# Patient Record
Sex: Female | Born: 1950
Health system: Southern US, Community
[De-identification: ages and names within clinical notes are randomized; demographics above are authoritative.]

## PROBLEM LIST (undated history)

## (undated) DIAGNOSIS — M810 Age-related osteoporosis without current pathological fracture: Secondary | ICD-10-CM

## (undated) DIAGNOSIS — E119 Type 2 diabetes mellitus without complications: Secondary | ICD-10-CM

## (undated) DIAGNOSIS — T7840XA Allergy, unspecified, initial encounter: Secondary | ICD-10-CM

## (undated) DIAGNOSIS — E785 Hyperlipidemia, unspecified: Secondary | ICD-10-CM

## (undated) DIAGNOSIS — C50911 Malignant neoplasm of unspecified site of right female breast: Secondary | ICD-10-CM

## (undated) DIAGNOSIS — F329 Major depressive disorder, single episode, unspecified: Secondary | ICD-10-CM

## (undated) DIAGNOSIS — F32A Depression, unspecified: Secondary | ICD-10-CM

## (undated) DIAGNOSIS — I10 Essential (primary) hypertension: Secondary | ICD-10-CM

## (undated) DIAGNOSIS — R0600 Dyspnea, unspecified: Secondary | ICD-10-CM

## (undated) DIAGNOSIS — G500 Trigeminal neuralgia: Secondary | ICD-10-CM

## (undated) HISTORY — DX: Hyperlipidemia, unspecified: E78.5

## (undated) HISTORY — DX: Type 2 diabetes mellitus without complications: E11.9

## (undated) HISTORY — DX: Allergy, unspecified, initial encounter: T78.40XA

## (undated) HISTORY — PX: SPINE SURGERY: SHX786

## (undated) HISTORY — DX: Trigeminal neuralgia: G50.0

## (undated) HISTORY — PX: BREAST EXCISIONAL BIOPSY: SUR124

## (undated) HISTORY — DX: Essential (primary) hypertension: I10

## (undated) HISTORY — DX: Age-related osteoporosis without current pathological fracture: M81.0

---
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
## 1976-09-16 HISTORY — PX: ABDOMINAL HYSTERECTOMY: SHX81

## 1984-09-16 HISTORY — PX: BREAST CYST EXCISION: SHX579

## 1993-09-16 HISTORY — PX: LAMINECTOMY: SHX219

## 2003-01-28 DIAGNOSIS — E1159 Type 2 diabetes mellitus with other circulatory complications: Secondary | ICD-10-CM | POA: Insufficient documentation

## 2003-03-09 DIAGNOSIS — K219 Gastro-esophageal reflux disease without esophagitis: Secondary | ICD-10-CM | POA: Insufficient documentation

## 2007-01-16 DIAGNOSIS — G500 Trigeminal neuralgia: Secondary | ICD-10-CM | POA: Insufficient documentation

## 2008-02-19 DIAGNOSIS — F4320 Adjustment disorder, unspecified: Secondary | ICD-10-CM | POA: Insufficient documentation

## 2009-02-24 DIAGNOSIS — E669 Obesity, unspecified: Secondary | ICD-10-CM | POA: Insufficient documentation

## 2010-09-16 DIAGNOSIS — E119 Type 2 diabetes mellitus without complications: Secondary | ICD-10-CM

## 2010-09-16 HISTORY — DX: Type 2 diabetes mellitus without complications: E11.9

## 2013-08-30 ENCOUNTER — Other Ambulatory Visit (HOSPITAL_COMMUNITY): Payer: Self-pay

## 2013-09-06 ENCOUNTER — Inpatient Hospital Stay (HOSPITAL_COMMUNITY): Admission: RE | Admit: 2013-09-06 | Payer: Self-pay | Source: Ambulatory Visit | Admitting: Orthopedic Surgery

## 2013-09-06 ENCOUNTER — Encounter (HOSPITAL_COMMUNITY): Admission: RE | Payer: Self-pay | Source: Ambulatory Visit

## 2013-09-06 SURGERY — ARTHROPLASTY, KNEE, TOTAL
Anesthesia: General | Laterality: Right

## 2014-04-27 LAB — CBC AND DIFFERENTIAL
HCT: 45 % (ref 36–46)
Hemoglobin: 14.7 g/dL (ref 12.0–16.0)
Platelets: 277 10*3/uL (ref 150–399)
WBC: 7.9 10^3/mL

## 2014-04-27 LAB — LIPID PANEL
Cholesterol: 208 mg/dL — AB (ref 0–200)
HDL: 60 mg/dL (ref 35–70)
LDL Cholesterol: 112 mg/dL
TRIGLYCERIDES: 181 mg/dL — AB (ref 40–160)

## 2014-04-27 LAB — BASIC METABOLIC PANEL
BUN: 26 mg/dL — AB (ref 4–21)
Creatinine: 0.9 mg/dL (ref <=1.1)
Glucose: 111 mg/dL
POTASSIUM: 4.4 mmol/L (ref 3.4–5.3)
Sodium: 142 mmol/L (ref 137–147)

## 2014-04-27 LAB — HEPATIC FUNCTION PANEL
ALT: 18 U/L (ref 7–35)
AST: 16 U/L (ref 13–35)

## 2014-04-27 LAB — TSH: TSH: 0.6 u[IU]/mL (ref <=5.90)

## 2014-12-06 LAB — HEMOGLOBIN A1C: Hgb A1c MFr Bld: 6 % (ref 4.0–6.0)

## 2015-01-31 DIAGNOSIS — M179 Osteoarthritis of knee, unspecified: Secondary | ICD-10-CM | POA: Insufficient documentation

## 2015-01-31 DIAGNOSIS — J309 Allergic rhinitis, unspecified: Secondary | ICD-10-CM | POA: Insufficient documentation

## 2015-01-31 DIAGNOSIS — E559 Vitamin D deficiency, unspecified: Secondary | ICD-10-CM | POA: Insufficient documentation

## 2015-01-31 DIAGNOSIS — M1710 Unilateral primary osteoarthritis, unspecified knee: Secondary | ICD-10-CM | POA: Insufficient documentation

## 2015-01-31 DIAGNOSIS — G4700 Insomnia, unspecified: Secondary | ICD-10-CM | POA: Insufficient documentation

## 2015-02-16 ENCOUNTER — Other Ambulatory Visit: Payer: Self-pay | Admitting: Family Medicine

## 2015-04-05 ENCOUNTER — Encounter: Payer: Self-pay | Admitting: Family Medicine

## 2015-04-05 ENCOUNTER — Ambulatory Visit (INDEPENDENT_AMBULATORY_CARE_PROVIDER_SITE_OTHER): Payer: BC Managed Care – PPO | Admitting: Family Medicine

## 2015-04-05 VITALS — BP 124/82 | HR 64 | Temp 98.2°F | Resp 16 | Ht 64.0 in | Wt 244.0 lb

## 2015-04-05 DIAGNOSIS — E119 Type 2 diabetes mellitus without complications: Secondary | ICD-10-CM | POA: Diagnosis not present

## 2015-04-05 DIAGNOSIS — E1169 Type 2 diabetes mellitus with other specified complication: Secondary | ICD-10-CM | POA: Insufficient documentation

## 2015-04-05 DIAGNOSIS — E785 Hyperlipidemia, unspecified: Secondary | ICD-10-CM | POA: Insufficient documentation

## 2015-04-05 DIAGNOSIS — E559 Vitamin D deficiency, unspecified: Secondary | ICD-10-CM | POA: Diagnosis not present

## 2015-04-05 DIAGNOSIS — G500 Trigeminal neuralgia: Secondary | ICD-10-CM

## 2015-04-05 DIAGNOSIS — Z0000 Encounter for general adult medical examination without abnormal findings: Secondary | ICD-10-CM

## 2015-04-05 DIAGNOSIS — F4322 Adjustment disorder with anxiety: Secondary | ICD-10-CM

## 2015-04-05 DIAGNOSIS — J309 Allergic rhinitis, unspecified: Secondary | ICD-10-CM

## 2015-04-05 DIAGNOSIS — I10 Essential (primary) hypertension: Secondary | ICD-10-CM | POA: Diagnosis not present

## 2015-04-05 DIAGNOSIS — M179 Osteoarthritis of knee, unspecified: Secondary | ICD-10-CM | POA: Diagnosis not present

## 2015-04-05 DIAGNOSIS — M1710 Unilateral primary osteoarthritis, unspecified knee: Secondary | ICD-10-CM

## 2015-04-05 MED ORDER — BUPROPION HCL ER (SR) 150 MG PO TB12: 150.0000 mg | ORAL_TABLET | Freq: Two times a day (BID) | ORAL | Status: DC

## 2015-04-05 MED ORDER — MELOXICAM 15 MG PO TABS: 15.0000 mg | ORAL_TABLET | Freq: Every day | ORAL | Status: DC

## 2015-04-05 MED ORDER — LIDOCAINE 5 % EX OINT: TOPICAL_OINTMENT | Freq: Every day | CUTANEOUS | Status: DC

## 2015-04-05 MED ORDER — METFORMIN HCL 1000 MG PO TABS: 1000.0000 mg | ORAL_TABLET | Freq: Two times a day (BID) | ORAL | Status: DC

## 2015-04-05 MED ORDER — PREDNISONE 10 MG PO TABS: ORAL_TABLET | ORAL | Status: DC

## 2015-04-05 MED ORDER — ALPRAZOLAM 0.5 MG PO TABS: 0.5000 mg | ORAL_TABLET | Freq: Every evening | ORAL | Status: DC | PRN

## 2015-04-05 MED ORDER — AMLODIPINE BESYLATE 5 MG PO TABS: 5.0000 mg | ORAL_TABLET | Freq: Every day | ORAL | Status: DC

## 2015-04-05 MED ORDER — LISINOPRIL-HYDROCHLOROTHIAZIDE 10-12.5 MG PO TABS: 1.0000 | ORAL_TABLET | Freq: Every day | ORAL | Status: DC

## 2015-04-05 MED ORDER — SIMVASTATIN 10 MG PO TABS: 10.0000 mg | ORAL_TABLET | Freq: Every day | ORAL | Status: DC

## 2015-04-05 MED ORDER — BISOPROLOL-HYDROCHLOROTHIAZIDE 10-6.25 MG PO TABS: 1.0000 | ORAL_TABLET | Freq: Two times a day (BID) | ORAL | Status: DC

## 2015-04-05 MED ORDER — SITAGLIPTIN PHOSPHATE 100 MG PO TABS: 100.0000 mg | ORAL_TABLET | Freq: Every day | ORAL | Status: DC

## 2015-04-05 MED ORDER — BACLOFEN 10 MG PO TABS: 10.0000 mg | ORAL_TABLET | Freq: Two times a day (BID) | ORAL | Status: DC

## 2015-04-05 NOTE — Progress Notes (Signed)
Patient ID: Nichole Martinez, female   DOB: [birth year], 64 y.o.   MRN: [id number]        Patient: Nichole Martinez, Female    DOB: Sep 29, 1950, 64 y.o.   MRN: [id number] Visit Date: 04/05/2015  Today's Provider: Margarita Rana, MD   Chief Complaint  Patient presents with  . Annual Exam   Subjective:    Annual physical exam Nichole Martinez is a 64 y.o. female who presents today for health maintenance and complete physical. She feels well. 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 She reports she is sleeping well.  -----------------------------------------------------------------   Review of Systems  Social History She  reports that she quit smoking about 15 years ago. She has never used smokeless tobacco. She reports that she does not drink alcohol or use illicit drugs.  Patient Active Problem List   Diagnosis Date Noted  . Allergic rhinitis 01/31/2015  . Arthritis of knee, degenerative 01/31/2015  . Cannot sleep 01/31/2015  . Avitaminosis D 01/31/2015  . Adiposity 02/24/2009  . Diabetes mellitus, type 2 03/07/2008  . Adaptation reaction 02/19/2008  . Fothergill's neuralgia 01/16/2007  . Acid reflux 03/09/2003  . Hypercholesteremia 02/15/2003  . BP (high blood pressure) 01/28/2003    Past Surgical History  Procedure Laterality Date  . Laminectomy  1995  . Abdominal hysterectomy  1978    Family History Her She was adopted. Family history is unknown by patient.    Allergies  Allergen Reactions  . Morphine     Anaphallaxis.    Previous Medications   ALPRAZOLAM (XANAX) 0.5 MG TABLET    Take by mouth.   AMLODIPINE (NORVASC) 5 MG TABLET    Take by mouth.   BACLOFEN (LIORESAL) 10 MG TABLET    Take by mouth.   BISOPROLOL-HYDROCHLOROTHIAZIDE (ZIAC) 10-6.25 MG PER TABLET    Take by mouth.   BUPROPION (WELLBUTRIN SR) 150 MG 12 HR TABLET    Take by mouth.   CYANOCOBALAMIN 1000 MCG TABLET    Take by mouth.   LIDOCAINE (XYLOCAINE) 5 %  OINTMENT    APPLY 1/2 INCH TOPICALLY TO KNEE AS NEEDED   LISINOPRIL-HYDROCHLOROTHIAZIDE (PRINZIDE,ZESTORETIC) 10-12.5 MG PER TABLET    Take by mouth.   LORATADINE (CLARITIN) 10 MG TABLET    Take by mouth.   MELOXICAM (MOBIC) 15 MG TABLET    Take by mouth.   METFORMIN (GLUCOPHAGE) 1000 MG TABLET    Take by mouth.   MULTIPLE VITAMINS PO    MULTIVITAMINS (Oral Tablet)  1 Every Day for 0 days  Quantity: 0.00;  Refills: 0   Ordered :17-Apr-2011  [name] ;  Started 24-February-2009 Active   OMEGA 3-6-9 FATTY ACIDS PO    OMEGA-3, 1000MG  (Oral Capsule)  1 Three Times Daily for 0 days  Quantity: 0.00;  Refills: 0   Ordered :17-Apr-2011  [name] ;  Started 24-February-2009 Active   PREDNISONE (DELTASONE) 10 MG TABLET    Take by mouth.   SIMVASTATIN (ZOCOR) 10 MG TABLET    Take by mouth.   SITAGLIPTIN (JANUVIA) 100 MG TABLET    Take by mouth.    Patient Care Team: Margarita Rana, MD as PCP - General (Family Medicine)     Objective:   Vitals: BP 124/82 mmHg  Pulse 64  Temp(Src) 98.2 F (36.8 C) (Oral)  Resp 16  Ht 5\' 4"  (1.626 m)  Wt 244 lb (110.678 kg)  BMI 41.86 kg/m2   Physical Exam  Constitutional: She appears well-developed and well-nourished.  HENT:  Head: Normocephalic and atraumatic.  Right Ear: Hearing, tympanic membrane, external ear and ear canal normal.  Left Ear: Hearing, tympanic membrane, external ear and ear canal normal.  Nose: Nose normal.  Mouth/Throat: Uvula is midline, oropharynx is clear and moist and mucous membranes are normal.  Eyes: Conjunctivae, EOM and lids are normal. Lids are everted and swept, no foreign bodies found.  Neck: Trachea normal and normal range of motion. Carotid bruit is not present.  Cardiovascular: Normal rate, regular rhythm, normal heart sounds and normal pulses.   Pulmonary/Chest: Effort normal and breath sounds normal.  Abdominal: Normal appearance.  Neurological: She is alert.     Depression Screen No flowsheet data  found.    Assessment & Plan:     Routine Health Maintenance and Physical Exam  Exercise Activities and Dietary recommendations Goals    None       There is no immunization history on file for this patient.  Health Maintenance  Topic Date Due  . PNEUMOCOCCAL POLYSACCHARIDE VACCINE (1) 09/04/1955  . FOOT EXAM  09/04/1963  . URINE MICROALBUMIN  09/04/1963  . HIV Screening  09/03/1968  . PAP SMEAR  09/04/1971  . TETANUS/TDAP  09/03/1972  . ZOSTAVAX  09/03/2013  . MAMMOGRAM  04/15/2016 (Originally 09/04/2003)  . COLONOSCOPY  04/15/2016 (Originally 09/04/2003)  . INFLUENZA VACCINE  04/17/2015  . HEMOGLOBIN A1C  06/08/2015  . OPHTHALMOLOGY EXAM  12/02/2015      Discussed health benefits of physical activity, and encouraged her to engage in regular exercise appropriate for her age and condition.   Patient was seen and examined by Jerrell Belfast, MD, and note scribed by Ashley Royalty, CMA.  I have reviewed the document for accuracy and completeness and I agree with above. Jerrell Belfast, MD   Margarita Rana, MD    --------------------------------------------------------------------

## 2015-04-06 LAB — CBC WITH DIFFERENTIAL/PLATELET
Basophils Absolute: 0 10*3/uL (ref 0.0–0.2)
Basos: 0 %
EOS (ABSOLUTE): 1.8 10*3/uL — ABNORMAL HIGH (ref 0.0–0.4)
Eos: 15 %
HEMOGLOBIN: 14.6 g/dL (ref 11.1–15.9)
Hematocrit: 45.4 % (ref 34.0–46.6)
IMMATURE GRANULOCYTES: 0 %
Immature Grans (Abs): 0 10*3/uL (ref 0.0–0.1)
LYMPHS: 24 %
Lymphocytes Absolute: 2.9 10*3/uL (ref 0.7–3.1)
MCH: 25.9 pg — AB (ref 26.6–33.0)
MCHC: 32.2 g/dL (ref 31.5–35.7)
MCV: 81 fL (ref 79–97)
Monocytes Absolute: 0.6 10*3/uL (ref 0.1–0.9)
Monocytes: 5 %
Neutrophils Absolute: 6.5 10*3/uL (ref 1.4–7.0)
Neutrophils: 56 %
Platelets: 308 10*3/uL (ref 150–379)
RBC: 5.64 x10E6/uL — ABNORMAL HIGH (ref 3.77–5.28)
RDW: 14.9 % (ref 12.3–15.4)
WBC: 11.8 10*3/uL — AB (ref 3.4–10.8)

## 2015-04-06 LAB — LIPID PANEL
CHOL/HDL RATIO: 3.9 ratio (ref 0.0–4.4)
Cholesterol, Total: 233 mg/dL — ABNORMAL HIGH (ref 100–199)
HDL: 59 mg/dL (ref >39)
LDL CALC: 116 mg/dL — AB (ref 0–99)
Triglycerides: 292 mg/dL — ABNORMAL HIGH (ref 0–149)
VLDL Cholesterol Cal: 58 mg/dL — ABNORMAL HIGH (ref 5–40)

## 2015-04-06 LAB — COMPREHENSIVE METABOLIC PANEL
ALT: 21 IU/L (ref 0–32)
AST: 20 IU/L (ref 0–40)
Albumin/Globulin Ratio: 1.4 (ref 1.1–2.5)
Albumin: 4.6 g/dL (ref 3.6–4.8)
Alkaline Phosphatase: 78 IU/L (ref 39–117)
BUN/Creatinine Ratio: 22 (ref 11–26)
BUN: 19 mg/dL (ref 8–27)
Bilirubin Total: 0.5 mg/dL (ref 0.0–1.2)
CO2: 22 mmol/L (ref 18–29)
Calcium: 10.3 mg/dL (ref 8.7–10.3)
Chloride: 98 mmol/L (ref 97–108)
Creatinine, Ser: 0.88 mg/dL (ref 0.57–1.00)
GFR calc Af Amer: 82 mL/min/{1.73_m2} (ref >59)
GFR calc non Af Amer: 71 mL/min/{1.73_m2} (ref >59)
Globulin, Total: 3.2 g/dL (ref 1.5–4.5)
Glucose: 144 mg/dL — ABNORMAL HIGH (ref 65–99)
POTASSIUM: 4.9 mmol/L (ref 3.5–5.2)
SODIUM: 142 mmol/L (ref 134–144)
TOTAL PROTEIN: 7.8 g/dL (ref 6.0–8.5)

## 2015-04-06 LAB — HEMOGLOBIN A1C
ESTIMATED AVERAGE GLUCOSE: 134 mg/dL
Hgb A1c MFr Bld: 6.3 % — ABNORMAL HIGH (ref 4.8–5.6)

## 2015-04-06 LAB — TSH: TSH: 1.59 u[IU]/mL (ref 0.450–4.500)

## 2015-04-06 LAB — VITAMIN D 25 HYDROXY (VIT D DEFICIENCY, FRACTURES): Vit D, 25-Hydroxy: 25.2 ng/mL — ABNORMAL LOW (ref 30.0–100.0)

## 2015-04-11 ENCOUNTER — Telehealth: Payer: Self-pay

## 2015-04-11 DIAGNOSIS — E785 Hyperlipidemia, unspecified: Secondary | ICD-10-CM

## 2015-04-11 MED ORDER — SIMVASTATIN 20 MG PO TABS: 20.0000 mg | ORAL_TABLET | Freq: Every day | ORAL | Status: DC

## 2015-04-11 NOTE — Telephone Encounter (Signed)
-----   Message from Margarita Rana, MD sent at 04/07/2015  5:39 PM EDT ----- Increase Simvastatin to 20 mg and recheck labs in about 2 months. Also, can increase Vit D to about 2000 iu daily. Thanks.

## 2015-04-11 NOTE — Telephone Encounter (Signed)
Pt advised as directed below.  Sent Simvastatin 20mg  to [facility] [address]   Thanks,   -Mickel Baas

## 2015-05-11 ENCOUNTER — Other Ambulatory Visit: Payer: Self-pay

## 2015-05-11 DIAGNOSIS — J309 Allergic rhinitis, unspecified: Secondary | ICD-10-CM

## 2015-05-11 MED ORDER — LORATADINE 10 MG PO TABS: 10.0000 mg | ORAL_TABLET | Freq: Every day | ORAL | Status: DC

## 2015-08-31 ENCOUNTER — Encounter: Payer: Self-pay | Admitting: Physician Assistant

## 2015-08-31 ENCOUNTER — Ambulatory Visit (INDEPENDENT_AMBULATORY_CARE_PROVIDER_SITE_OTHER): Payer: BC Managed Care – PPO | Admitting: Physician Assistant

## 2015-08-31 VITALS — BP 138/80 | HR 83 | Temp 98.6°F | Resp 14 | Wt 245.9 lb

## 2015-08-31 DIAGNOSIS — J0140 Acute pansinusitis, unspecified: Secondary | ICD-10-CM | POA: Diagnosis not present

## 2015-08-31 DIAGNOSIS — H66002 Acute suppurative otitis media without spontaneous rupture of ear drum, left ear: Secondary | ICD-10-CM | POA: Diagnosis not present

## 2015-08-31 MED ORDER — AZITHROMYCIN 250 MG PO TABS: ORAL_TABLET | ORAL | Status: DC

## 2015-08-31 NOTE — Patient Instructions (Signed)
Otitis Media With Effusion Otitis media with effusion is the presence of fluid in the middle ear. This is a common problem in children, which often follows ear infections. It may be present for weeks or longer after the infection. Unlike an acute ear infection, otitis media with effusion refers only to fluid behind the ear drum and not infection. Children with repeated ear and sinus infections and allergy problems are the most likely to get otitis media with effusion. CAUSES  The most frequent cause of the fluid buildup is dysfunction of the eustachian tubes. These are the tubes that drain fluid in the ears to the back of the nose (nasopharynx). SYMPTOMS   The main symptom of this condition is hearing loss. As a result, you or your child may:  Listen to the TV at a loud volume.  Not respond to questions.  Ask "what" often when spoken to.  Mistake or confuse one sound or word for another.  There may be a sensation of fullness or pressure but usually not pain. DIAGNOSIS   Your health care provider will diagnose this condition by examining you or your child's ears.  Your health care provider may test the pressure in you or your child's ear with a tympanometer.  A hearing test may be conducted if the problem persists. TREATMENT   Treatment depends on the duration and the effects of the effusion.  Antibiotics, decongestants, nose drops, and cortisone-type drugs (tablets or nasal spray) may not be helpful.  Children with persistent ear effusions may have delayed language or behavioral problems. Children at risk for developmental delays in hearing, learning, and speech may require referral to a specialist earlier than children not at risk.  You or your child's health care provider may suggest a referral to an ear, nose, and throat surgeon for treatment. The following may help restore normal hearing:  Drainage of fluid.  Placement of ear tubes (tympanostomy tubes).  Removal of adenoids  (adenoidectomy). HOME CARE INSTRUCTIONS   Avoid secondhand smoke.  Infants who are breastfed are less likely to have this condition.  Avoid feeding infants while they are lying flat.  Avoid known environmental allergens.  Avoid people who are sick. SEEK MEDICAL CARE IF:   Hearing is not better in 3 months.  Hearing is worse.  Ear pain.  Drainage from the ear.  Dizziness. MAKE SURE YOU:   Understand these instructions.  Will watch your condition.  Will get help right away if you are not doing well or get worse.   This information is not intended to replace advice given to you by your health care provider. Make sure you discuss any questions you have with your health care provider.   Document Released: 10/10/2004 Document Revised: 09/23/2014 Document Reviewed: 03/30/2013 Elsevier Interactive Patient Education 2016 Elsevier Inc[employer] Sinusitis, Adult Sinusitis is redness, soreness, and inflammation of the paranasal sinuses. Paranasal sinuses are air pockets within the bones of your face. They are located beneath your eyes, in the middle of your forehead, and above your eyes. In healthy paranasal sinuses, mucus is able to drain out, and air is able to circulate through them by way of your nose. However, when your paranasal sinuses are inflamed, mucus and air can become trapped. This can allow bacteria and other germs to grow and cause infection. Sinusitis can develop quickly and last only a short time (acute) or continue over a long period (chronic). Sinusitis that lasts for more than 12 weeks is considered chronic. CAUSES Causes of sinusitis   include:  Allergies.  Structural abnormalities, such as displacement of the cartilage that separates your nostrils (deviated septum), which can decrease the air flow through your nose and sinuses and affect sinus drainage.  Functional abnormalities, such as when the small hairs (cilia) that line your sinuses and help remove mucus do not work  properly or are not present. SIGNS AND SYMPTOMS Symptoms of acute and chronic sinusitis are the same. The primary symptoms are pain and pressure around the affected sinuses. Other symptoms include:  Upper toothache.  Earache.  Headache.  Bad breath.  Decreased sense of smell and taste.  A cough, which worsens when you are lying flat.  Fatigue.  Fever.  Thick drainage from your nose, which often is green and may contain pus (purulent).  Swelling and warmth over the affected sinuses. DIAGNOSIS Your health care provider will perform a physical exam. During your exam, your health care provider may perform any of the following to help determine if you have acute sinusitis or chronic sinusitis:  Look in your nose for signs of abnormal growths in your nostrils (nasal polyps).  Tap over the affected sinus to check for signs of infection.  View the inside of your sinuses using an imaging device that has a light attached (endoscope). If your health care provider suspects that you have chronic sinusitis, one or more of the following tests may be recommended:  Allergy tests.  Nasal culture. A sample of mucus is taken from your nose, sent to a lab, and screened for bacteria.  Nasal cytology. A sample of mucus is taken from your nose and examined by your health care provider to determine if your sinusitis is related to an allergy. TREATMENT Most cases of acute sinusitis are related to a viral infection and will resolve on their own within 10 days. Sometimes, medicines are prescribed to help relieve symptoms of both acute and chronic sinusitis. These may include pain medicines, decongestants, nasal steroid sprays, or saline sprays. However, for sinusitis related to a bacterial infection, your health care provider will prescribe antibiotic medicines. These are medicines that will help kill the bacteria causing the infection. Rarely, sinusitis is caused by a fungal infection. In these cases,  your health care provider will prescribe antifungal medicine. For some cases of chronic sinusitis, surgery is needed. Generally, these are cases in which sinusitis recurs more than 3 times per year, despite other treatments. HOME CARE INSTRUCTIONS  Drink plenty of water. Water helps thin the mucus so your sinuses can drain more easily.  Use a humidifier.  Inhale steam 3-4 times a day (for example, sit in the bathroom with the shower running).  Apply a warm, moist washcloth to your face 3-4 times a day, or as directed by your health care provider.  Use saline nasal sprays to help moisten and clean your sinuses.  Take medicines only as directed by your health care provider.  If you were prescribed either an antibiotic or antifungal medicine, finish it all even if you start to feel better. SEEK IMMEDIATE MEDICAL CARE IF:  You have increasing pain or severe headaches.  You have nausea, vomiting, or drowsiness.  You have swelling around your face.  You have vision problems.  You have a stiff neck.  You have difficulty breathing.   This information is not intended to replace advice given to you by your health care provider. Make sure you discuss any questions you have with your health care provider.   Document Released: 09/02/2005 Document Revised: 09/23/2014   Document Reviewed: 09/17/2011 Elsevier Interactive Patient Education 2016 Elsevier Inc.  

## 2015-08-31 NOTE — Progress Notes (Signed)
Patient: Nichole Martinez Female    DOB: Mar 02, 1951   64 y.o.   MRN: [id number] Visit Date: 08/31/2015  Today's Provider: [name], PA-C   Chief Complaint  Patient presents with  . URI   Subjective:    URI  This is a new problem. The current episode started 1 to 4 weeks ago. The problem has been gradually worsening. There has been no fever. Associated symptoms include congestion, coughing, rhinorrhea, a sore throat and wheezing. Pertinent negatives include no ear pain, headaches, nausea, sinus pain, sneezing or vomiting. She has tried decongestant (Delsym, Vicks severe cold and Flu and Mucinex) for the symptoms.  Patient is also requesting a note to excuse her from work. 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 She is unable to work when she has any viral symptoms so as not to spread and cause illness with patient.     Allergies  Allergen Reactions  . Morphine     Anaphallaxis.   Previous Medications   ALPRAZOLAM (XANAX) 0.5 MG TABLET    Take 1 tablet (0.5 mg total) by mouth at bedtime as needed for anxiety.   AMLODIPINE (NORVASC) 5 MG TABLET    Take 1 tablet (5 mg total) by mouth daily.   BACLOFEN (LIORESAL) 10 MG TABLET    Take 1 tablet (10 mg total) by mouth 2 (two) times daily.   BISOPROLOL-HYDROCHLOROTHIAZIDE (ZIAC) 10-6.25 MG PER TABLET    Take 1 tablet by mouth 2 (two) times daily.   BUPROPION (WELLBUTRIN SR) 150 MG 12 HR TABLET    Take 1 tablet (150 mg total) by mouth 2 (two) times daily.   CYANOCOBALAMIN 1000 MCG TABLET    Take by mouth.   LIDOCAINE (XYLOCAINE) 5 % OINTMENT    Apply topically daily.   LISINOPRIL-HYDROCHLOROTHIAZIDE (PRINZIDE,ZESTORETIC) 10-12.5 MG PER TABLET    Take 1 tablet by mouth daily.   LORATADINE (CLARITIN) 10 MG TABLET    Take 1 tablet (10 mg total) by mouth daily.   MELOXICAM (MOBIC) 15 MG TABLET    Take 1 tablet (15 mg total) by mouth daily.   METFORMIN (GLUCOPHAGE) 1000 MG TABLET     Take 1 tablet (1,000 mg total) by mouth 2 (two) times daily with a meal.   MULTIPLE VITAMINS PO    MULTIVITAMINS (Oral Tablet)  1 Every Day for 0 days  Quantity: 0.00;  Refills: 0   Ordered :17-Apr-2011  [name] ;  Started 24-February-2009 Active   OMEGA 3-6-9 FATTY ACIDS PO    OMEGA-3, 1000MG  (Oral Capsule)  1 Three Times Daily for 0 days  Quantity: 0.00;  Refills: 0   Ordered :17-Apr-2011  [name] ;  Started 24-February-2009 Active   PREDNISONE (DELTASONE) 10 MG TABLET    5 Tablets for one day; 4 tablets for one day; 3 tablets for one day; 2 tablets for one day; 1 tablet for one day.   SIMVASTATIN (ZOCOR) 20 MG TABLET    Take 1 tablet (20 mg total) by mouth at bedtime.   SITAGLIPTIN (JANUVIA) 100 MG TABLET    Take 1 tablet (100 mg total) by mouth daily.    Review of Systems  Constitutional: Positive for fatigue. Negative for fever.  HENT: Positive for congestion, rhinorrhea, sinus pressure and sore throat. Negative for ear pain, postnasal drip, sneezing, tinnitus, trouble swallowing and voice change.   Eyes: Negative.   Respiratory: Positive for cough, chest tightness and  wheezing. Negative for shortness of breath.   Cardiovascular: Negative.   Gastrointestinal: Negative for nausea and vomiting.  Neurological: Negative for dizziness and headaches.    Social History  Substance Use Topics  . Smoking status: Former Smoker    Quit date: 03/16/2000  . Smokeless tobacco: Never Used  . Alcohol Use: No   Objective:   BP 138/80 mmHg  Pulse 83  Temp(Src) 98.6 F (37 C) (Oral)  Resp 14  Wt 245 lb 14.4 oz (111.54 kg)  SpO2 95%  Physical Exam  Constitutional: She appears well-developed and well-nourished. No distress.  HENT:  Head: Normocephalic and atraumatic.  Right Ear: Hearing, external ear and ear canal normal. Tympanic membrane is not erythematous and not bulging. A middle ear effusion is present.  Left Ear: Hearing, external ear and ear canal normal. Tympanic membrane is  erythematous and bulging. A middle ear effusion is present.  Nose: Mucosal edema and rhinorrhea present. Right sinus exhibits maxillary sinus tenderness and frontal sinus tenderness. Left sinus exhibits maxillary sinus tenderness and frontal sinus tenderness.  Mouth/Throat: Uvula is midline, oropharynx is clear and moist and mucous membranes are normal. No oropharyngeal exudate.  Neck: Normal range of motion. Neck supple. No tracheal deviation present. No thyromegaly present.  Cardiovascular: Normal rate, regular rhythm and normal heart sounds.  Exam reveals no gallop and no friction rub.   No murmur heard. Pulmonary/Chest: Effort normal and breath sounds normal. No stridor. No respiratory distress. She has no wheezes. She has no rales.  Lymphadenopathy:    She has no cervical adenopathy.  Skin: She is not diaphoretic.  Vitals reviewed.       Assessment & Plan:     1. Acute pansinusitis, recurrence not specified Symptoms started approximately one week ago and she did notice that this past Sunday she started feeling better but then symptoms started again Monday morning with the dry cough and have progressed since. I will treat her with a Z-Pak as below. I did advise her to continue taking the Mucinex for congestion. She may take Tylenol as needed for fevers. Also gave her a note to excuse her for work while she is taking the antibiotic and to return to work on the summer 19th 2016. She is to call the office if she has any worsening symptoms or failure to improve in 7-10 days. - azithromycin (ZITHROMAX) 250 MG tablet; Take 2 tablets PO on day one, and one tablet PO daily thereafter until completed.  Dispense: 6 tablet; Refill: 0  2. Acute suppurative otitis media of left ear without spontaneous rupture of tympanic membrane, recurrence not specified See above medical treatment plan. - azithromycin (ZITHROMAX) 250 MG tablet; Take 2 tablets PO on day one, and one tablet PO daily thereafter until  completed.  Dispense: 6 tablet; Refill: 0       [name], PA-C  [facility]

## 2015-10-04 ENCOUNTER — Ambulatory Visit (INDEPENDENT_AMBULATORY_CARE_PROVIDER_SITE_OTHER): Payer: BC Managed Care – PPO | Admitting: Family Medicine

## 2015-10-04 ENCOUNTER — Encounter: Payer: Self-pay | Admitting: Family Medicine

## 2015-10-04 VITALS — BP 134/84 | HR 68 | Temp 98.2°F | Resp 20 | Wt 248.0 lb

## 2015-10-04 DIAGNOSIS — E785 Hyperlipidemia, unspecified: Secondary | ICD-10-CM | POA: Diagnosis not present

## 2015-10-04 DIAGNOSIS — I10 Essential (primary) hypertension: Secondary | ICD-10-CM | POA: Diagnosis not present

## 2015-10-04 DIAGNOSIS — G43009 Migraine without aura, not intractable, without status migrainosus: Secondary | ICD-10-CM | POA: Diagnosis not present

## 2015-10-04 DIAGNOSIS — G43909 Migraine, unspecified, not intractable, without status migrainosus: Secondary | ICD-10-CM | POA: Insufficient documentation

## 2015-10-04 DIAGNOSIS — E119 Type 2 diabetes mellitus without complications: Secondary | ICD-10-CM

## 2015-10-04 DIAGNOSIS — M1710 Unilateral primary osteoarthritis, unspecified knee: Secondary | ICD-10-CM

## 2015-10-04 DIAGNOSIS — M179 Osteoarthritis of knee, unspecified: Secondary | ICD-10-CM

## 2015-10-04 LAB — POCT GLYCOSYLATED HEMOGLOBIN (HGB A1C)
ESTIMATED AVERAGE GLUCOSE: 131
HEMOGLOBIN A1C: 6.2

## 2015-10-04 MED ORDER — LIDOCAINE 5 % EX OINT: TOPICAL_OINTMENT | Freq: Two times a day (BID) | CUTANEOUS | Status: DC | PRN

## 2015-10-04 NOTE — Progress Notes (Signed)
Subjective:    Patient ID: Nichole Martinez, female    DOB: [birth year], 65 y.o.   MRN: [id number]  Diabetes She presents for her follow-up (Last A1C 04/05/2015 and was 6.3%) diabetic visit. She has type 2 diabetes mellitus. Hypoglycemia symptoms include headaches. Pertinent negatives for diabetes include no blurred vision, no chest pain, no fatigue, no foot paresthesias, no foot ulcerations, no polydipsia, no polyphagia, no polyuria, no visual change, no weakness and no weight loss. Risk factors for coronary artery disease include diabetes mellitus, dyslipidemia, hypertension, obesity and post-menopausal. Current diabetic treatment includes oral agent (dual therapy) (Metformin 1000 mg BID, Januvia 100 mg). She is compliant with treatment most of the time (pt was sick and stopped taking medicine for about 21 days. Started back taking medications regularly about 09/13/2015). She is following a generally unhealthy ("I eat a lot of carbs") diet. She never (due to arthritis of knee) participates in exercise. Her home blood glucose trend is increasing steadily. An ACE inhibitor/angiotensin II receptor blocker is being taken. Eye exam is current (March 2016. [facility]).  Hyperlipidemia This is a chronic problem. Recent lipid tests were reviewed and are high (04/05/2015 Total- 233, Trig- 292, HDL- 59, LDL- 116. Simvastatin was increased to 20 mg after last labs were checked). Pertinent negatives include no chest pain or shortness of breath. Current antihyperlipidemic treatment includes statins (Simvastatin 20 mg).  Vitamin D Deficiency Last checked on 04/05/2015 and was 25.2. Pt since increased Vitamin D to 5000 IU po qd. Migraine Pt states she is having recurrence of migraine headaches. Pt has had a total of 8 migraines since July. Was previously diagnosed with migraines when pt was about 18. Pt reports H/As improved since having hysterectomy in her 6's, until recently. Pt is cannot remember what  medication she was on at that time. Unknown triggers. Denies sleep apnea sx.  Also with chronic knee pain. Needs to use lidocaine twice a day to control symptoms. Is bone on bone.   Review of Systems  Constitutional: Negative for weight loss and fatigue.  Eyes: Negative for blurred vision.  Respiratory: Positive for cough. Negative for shortness of breath and wheezing.   Cardiovascular: Negative for chest pain, palpitations and leg swelling.  Endocrine: Negative for polydipsia, polyphagia and polyuria.  Musculoskeletal: Positive for arthralgias.  Neurological: Positive for headaches. Negative for weakness.   BP 134/84 mmHg  Pulse 68  Temp(Src) 98.2 F (36.8 C) (Oral)  Resp 20  Wt 248 lb (112.492 kg)   Patient Active Problem List   Diagnosis Date Noted  . Hyperlipemia 04/05/2015  . Allergic rhinitis 01/31/2015  . Arthritis of knee, degenerative 01/31/2015  . Cannot sleep 01/31/2015  . Avitaminosis D 01/31/2015  . Adiposity 02/24/2009  . Diabetes mellitus, type 2 ([place]) 03/07/2008  . Adaptation reaction 02/19/2008  . Fothergill's neuralgia 01/16/2007  . Acid reflux 03/09/2003  . BP (high blood pressure) 01/28/2003   No past medical history on file. Current Outpatient Prescriptions on File Prior to Visit  Medication Sig  . ALPRAZolam (XANAX) 0.5 MG tablet Take 1 tablet (0.5 mg total) by mouth at bedtime as needed for anxiety.  Marland Kitchen amLODipine (NORVASC) 5 MG tablet Take 1 tablet (5 mg total) by mouth daily.  . baclofen (LIORESAL) 10 MG tablet Take 1 tablet (10 mg total) by mouth 2 (two) times daily.  . bisoprolol-hydrochlorothiazide (ZIAC) 10-6.25 MG per tablet Take 1 tablet by mouth 2 (two) times daily.  Marland Kitchen buPROPion (WELLBUTRIN SR) 150 MG 12 hr tablet  Take 1 tablet (150 mg total) by mouth 2 (two) times daily.  . cyanocobalamin 1000 MCG tablet Take by mouth.  . lidocaine (XYLOCAINE) 5 % ointment Apply topically daily.  Marland Kitchen lisinopril-hydrochlorothiazide (PRINZIDE,ZESTORETIC) 10-12.5  MG per tablet Take 1 tablet by mouth daily.  Marland Kitchen loratadine (CLARITIN) 10 MG tablet Take 1 tablet (10 mg total) by mouth daily.  . meloxicam (MOBIC) 15 MG tablet Take 1 tablet (15 mg total) by mouth daily.  . metFORMIN (GLUCOPHAGE) 1000 MG tablet Take 1 tablet (1,000 mg total) by mouth 2 (two) times daily with a meal.  . MULTIPLE VITAMINS PO MULTIVITAMINS (Oral Tablet)  1 Every Day for 0 days  Quantity: 0.00;  Refills: 0   Ordered :17-Apr-2011  [name] ;  Started 24-February-2009 Active  . simvastatin (ZOCOR) 20 MG tablet Take 1 tablet (20 mg total) by mouth at bedtime.  . sitaGLIPtin (JANUVIA) 100 MG tablet Take 1 tablet (100 mg total) by mouth daily.  . OMEGA 3-6-9 FATTY ACIDS PO Reported on 10/04/2015   No current facility-administered medications on file prior to visit.   Allergies  Allergen Reactions  . Morphine     Anaphallaxis.   Past Surgical History  Procedure Laterality Date  . Laminectomy  1995  . Abdominal hysterectomy  1978   Social History   Social History  . Marital Status: Divorced    Spouse Name: N/A  . Number of Children: 1  . Years of Education: N/A   Occupational History  . RN [employer]   Social History Main Topics  . Smoking status: Former Smoker    Quit date: 03/16/2000  . Smokeless tobacco: Never Used  . Alcohol Use: No  . Drug Use: No  . Sexual Activity: Not on file   Other Topics Concern  . Not on file   Social History Narrative   Family History  Problem Relation Age of Onset  . Adopted: Yes  . Family history unknown: Yes      Objective:   Physical Exam  Constitutional: She is oriented to person, place, and time. She appears well-developed and well-nourished.  Cardiovascular: Normal rate and regular rhythm.   Pulmonary/Chest: Effort normal and breath sounds normal.  Neurological: She is alert and oriented to person, place, and time.  Psychiatric: She has a normal mood and affect. Her behavior is normal. Judgment and thought content  normal.   BP 134/84 mmHg  Pulse 68  Temp(Src) 98.2 F (36.8 C) (Oral)  Resp 20  Wt 248 lb (112.492 kg)      Assessment & Plan:   1. Type 2 diabetes mellitus without complication, without long-term current use of insulin (HCC) Stable. Was sick for about a month. Continue to adjust lifestyle and continue current plan of care. Recheck in 6 months.   - POCT glycosylated hemoglobin (Hb A1C) Results for orders placed or performed in visit on 10/04/15  POCT glycosylated hemoglobin (Hb A1C)  Result Value Ref Range   Hemoglobin A1C 6.2    Est. average glucose Bld gHb Est-mCnc 131     2. Essential hypertension Condition is stable. Please continue current medication and  plan of care as noted.    3. Migraine without aura and without status migrainosus, not intractable Not sure why getting recurrence. Will look for pattern. Will call with what med she took that helped previously.   4. Hyperlipemia Condition is stable. Please continue current medication and  plan of care as noted.  I have reviewed the document  for accuracy and completeness and I agree with above. Jerrell Belfast, MD  - Comprehensive Metabolic Panel (CMET) - Lipid panel  5. Osteoarthritis of knee, unspecified laterality, unspecified osteoarthritis type Needs lidocaine bid just to function. Will refill medication.  - lidocaine (XYLOCAINE) 5 % ointment; Apply topically 2 (two) times daily as needed.  Dispense: 70 g; Refill: 11   Patient was seen and examined by Jerrell Belfast, MD, and note scribed by Renaldo Fiddler, CMA. I have reviewed the document for accuracy and completeness and I agree with above. Jerrell Belfast, MD   Margarita Rana, MD

## 2015-10-05 ENCOUNTER — Telehealth: Payer: Self-pay

## 2015-10-05 LAB — LIPID PANEL
CHOL/HDL RATIO: 3.3 ratio (ref 0.0–4.4)
Cholesterol, Total: 196 mg/dL (ref 100–199)
HDL: 60 mg/dL (ref >39)
LDL Calculated: 93 mg/dL (ref 0–99)
Triglycerides: 214 mg/dL — ABNORMAL HIGH (ref 0–149)
VLDL Cholesterol Cal: 43 mg/dL — ABNORMAL HIGH (ref 5–40)

## 2015-10-05 LAB — COMPREHENSIVE METABOLIC PANEL
A/G RATIO: 1.8 (ref 1.1–2.5)
ALBUMIN: 4.7 g/dL (ref 3.6–4.8)
ALT: 16 IU/L (ref 0–32)
AST: 14 IU/L (ref 0–40)
Alkaline Phosphatase: 73 IU/L (ref 39–117)
BUN / CREAT RATIO: 32 — AB (ref 11–26)
BUN: 21 mg/dL (ref 8–27)
Bilirubin Total: 0.2 mg/dL (ref 0.0–1.2)
CALCIUM: 9.7 mg/dL (ref 8.7–10.3)
CO2: 25 mmol/L (ref 18–29)
Chloride: 100 mmol/L (ref 96–106)
Creatinine, Ser: 0.65 mg/dL (ref 0.57–1.00)
GFR, EST AFRICAN AMERICAN: 110 mL/min/{1.73_m2} (ref >59)
GFR, EST NON AFRICAN AMERICAN: 95 mL/min/{1.73_m2} (ref >59)
GLOBULIN, TOTAL: 2.6 g/dL (ref 1.5–4.5)
Glucose: 117 mg/dL — ABNORMAL HIGH (ref 65–99)
POTASSIUM: 4.4 mmol/L (ref 3.5–5.2)
SODIUM: 141 mmol/L (ref 134–144)
TOTAL PROTEIN: 7.3 g/dL (ref 6.0–8.5)

## 2015-10-05 NOTE — Telephone Encounter (Signed)
-----   Message from Margarita Rana, MD sent at 10/05/2015  6:58 AM EST ----- Labs stable. Please notify patient. Thanks.

## 2015-10-05 NOTE — Telephone Encounter (Signed)
Please let patient know that can cause coronary vasospasm.  See if still wants medication. Thanks.

## 2015-10-05 NOTE — Telephone Encounter (Signed)
LMTCB Emily Drozdowski, CMA  

## 2015-10-05 NOTE — Telephone Encounter (Signed)
Pt advised.  She wanted to let you know the migraine medication she would like to try is Maxalt 5mg   Thanks,   -Mickel Baas

## 2015-10-06 MED ORDER — RIZATRIPTAN BENZOATE 5 MG PO TABS: 5.0000 mg | ORAL_TABLET | ORAL | Status: DC | PRN

## 2015-10-06 NOTE — Telephone Encounter (Signed)
Pt states she does still want this medication. Sates, "I have reached that point". [address] Renaldo Fiddler, CMA

## 2015-11-08 ENCOUNTER — Other Ambulatory Visit: Payer: Self-pay | Admitting: Family Medicine

## 2015-11-08 DIAGNOSIS — F4322 Adjustment disorder with anxiety: Secondary | ICD-10-CM

## 2015-11-08 NOTE — Telephone Encounter (Signed)
RX called in at [facility] pharmacy  

## 2015-11-08 NOTE — Telephone Encounter (Signed)
Printed, please fax or call in to pharmacy. Thank you.   

## 2016-03-07 LAB — HM DIABETES EYE EXAM

## 2016-04-07 ENCOUNTER — Other Ambulatory Visit: Payer: Self-pay | Admitting: Family Medicine

## 2016-04-07 DIAGNOSIS — F4322 Adjustment disorder with anxiety: Secondary | ICD-10-CM

## 2016-04-07 DIAGNOSIS — E119 Type 2 diabetes mellitus without complications: Secondary | ICD-10-CM

## 2016-04-10 ENCOUNTER — Ambulatory Visit (INDEPENDENT_AMBULATORY_CARE_PROVIDER_SITE_OTHER): Payer: BC Managed Care – PPO | Admitting: Physician Assistant

## 2016-04-10 ENCOUNTER — Encounter: Payer: Self-pay | Admitting: Physician Assistant

## 2016-04-10 ENCOUNTER — Encounter: Payer: BC Managed Care – PPO | Admitting: Family Medicine

## 2016-04-10 VITALS — BP 130/80 | HR 66 | Temp 98.2°F | Resp 16 | Ht 64.0 in | Wt 245.0 lb

## 2016-04-10 DIAGNOSIS — E559 Vitamin D deficiency, unspecified: Secondary | ICD-10-CM | POA: Diagnosis not present

## 2016-04-10 DIAGNOSIS — E785 Hyperlipidemia, unspecified: Secondary | ICD-10-CM | POA: Diagnosis not present

## 2016-04-10 DIAGNOSIS — Z0000 Encounter for general adult medical examination without abnormal findings: Secondary | ICD-10-CM | POA: Diagnosis not present

## 2016-04-10 DIAGNOSIS — G43109 Migraine with aura, not intractable, without status migrainosus: Secondary | ICD-10-CM | POA: Diagnosis not present

## 2016-04-10 DIAGNOSIS — F4322 Adjustment disorder with anxiety: Secondary | ICD-10-CM | POA: Diagnosis not present

## 2016-04-10 DIAGNOSIS — E119 Type 2 diabetes mellitus without complications: Secondary | ICD-10-CM

## 2016-04-10 DIAGNOSIS — M179 Osteoarthritis of knee, unspecified: Secondary | ICD-10-CM | POA: Diagnosis not present

## 2016-04-10 DIAGNOSIS — I10 Essential (primary) hypertension: Secondary | ICD-10-CM | POA: Diagnosis not present

## 2016-04-10 DIAGNOSIS — M1710 Unilateral primary osteoarthritis, unspecified knee: Secondary | ICD-10-CM

## 2016-04-10 MED ORDER — SIMVASTATIN 20 MG PO TABS: 20.0000 mg | ORAL_TABLET | Freq: Every day | ORAL | 3 refills | Status: DC

## 2016-04-10 MED ORDER — BISOPROLOL-HYDROCHLOROTHIAZIDE 10-6.25 MG PO TABS: 1.0000 | ORAL_TABLET | Freq: Two times a day (BID) | ORAL | 3 refills | Status: DC

## 2016-04-10 MED ORDER — AMLODIPINE BESYLATE 5 MG PO TABS: 5.0000 mg | ORAL_TABLET | Freq: Every day | ORAL | 3 refills | Status: DC

## 2016-04-10 MED ORDER — SITAGLIPTIN PHOSPHATE 100 MG PO TABS: 100.0000 mg | ORAL_TABLET | Freq: Every day | ORAL | 3 refills | Status: DC

## 2016-04-10 MED ORDER — BACLOFEN 10 MG PO TABS: 10.0000 mg | ORAL_TABLET | Freq: Two times a day (BID) | ORAL | 3 refills | Status: DC

## 2016-04-10 MED ORDER — MELOXICAM 15 MG PO TABS: 15.0000 mg | ORAL_TABLET | Freq: Every day | ORAL | 3 refills | Status: DC

## 2016-04-10 MED ORDER — METFORMIN HCL 1000 MG PO TABS: 1000.0000 mg | ORAL_TABLET | Freq: Two times a day (BID) | ORAL | 3 refills | Status: DC

## 2016-04-10 MED ORDER — LISINOPRIL-HYDROCHLOROTHIAZIDE 10-12.5 MG PO TABS: 1.0000 | ORAL_TABLET | Freq: Every day | ORAL | 3 refills | Status: DC

## 2016-04-10 MED ORDER — BUPROPION HCL ER (SR) 150 MG PO TB12: 150.0000 mg | ORAL_TABLET | Freq: Two times a day (BID) | ORAL | 3 refills | Status: DC

## 2016-04-10 MED ORDER — RIZATRIPTAN BENZOATE 5 MG PO TABS: 5.0000 mg | ORAL_TABLET | ORAL | 5 refills | Status: DC | PRN

## 2016-04-10 MED ORDER — ALPRAZOLAM 1 MG PO TABS: 1.0000 mg | ORAL_TABLET | Freq: Every evening | ORAL | 1 refills | Status: DC | PRN

## 2016-04-10 NOTE — Progress Notes (Signed)
Patient: Nichole Martinez, Female    DOB: [birth year], 65 y.o.   MRN: [id number] Visit Date: 04/10/2016  Today's Provider: [name], PA-C   Chief Complaint  Patient presents with  . Annual Exam   Subjective:    Annual physical exam Nichole Martinez is a 65 y.o. female who presents today for health maintenance and complete physical. She feels well. She reports exercising little, she walks her dog. She reports she is sleeping fairly well.She reports that in the last 5 months she has been having more trouble with her sleep. She takes Xanax 0.5 mg and is wondering if her dose can be increase. She gets her mammogram and breast exam with the Gyn at [facility]. She also reports that she sees Dr.Wainer for her right knee every 6 months. -----------------------------------------------------------------   Review of Systems  Constitutional: Negative.   HENT: Negative.   Eyes: Negative.   Respiratory: Negative.   Cardiovascular: Negative.   Gastrointestinal: Negative.   Endocrine: Negative.   Genitourinary: Negative.   Musculoskeletal: Positive for joint swelling (OA on the right knee.).  Skin: Negative.   Allergic/Immunologic: Negative.   Neurological: Negative.   Hematological: Negative.   Psychiatric/Behavioral: Negative.     Social History      She  reports that she quit smoking about 16 years ago. She has never used smokeless tobacco. She reports that she does not drink alcohol or use drugs.       Social History   Social History  . Marital status: Divorced    Spouse name: N/A  . Number of children: 1  . Years of education: N/A   Occupational History  . RN [employer]   Social History Main Topics  . Smoking status: Former Smoker    Quit date: 03/16/2000  . Smokeless tobacco: Never Used  . Alcohol use No  . Drug use: No  . Sexual activity: Not Asked   Other Topics Concern  . None   Social History Narrative  . None    History reviewed. No pertinent  past medical history.   Patient Active Problem List   Diagnosis Date Noted  . Migraine 10/04/2015  . Hyperlipemia 04/05/2015  . Allergic rhinitis 01/31/2015  . Arthritis of knee, degenerative 01/31/2015  . Cannot sleep 01/31/2015  . Avitaminosis D 01/31/2015  . Adiposity 02/24/2009  . Diabetes mellitus, type 2 ([place]) 03/07/2008  . Adaptation reaction 02/19/2008  . Fothergill's neuralgia 01/16/2007  . Acid reflux 03/09/2003  . BP (high blood pressure) 01/28/2003    Past Surgical History:  Procedure Laterality Date  . ABDOMINAL HYSTERECTOMY  1978  . LAMINECTOMY  1995    Family History        No family status information on file.        Her She was adopted. Family history is unknown by patient.    Allergies  Allergen Reactions  . Morphine     Anaphallaxis.    Current Meds  Medication Sig  . ALPRAZolam (XANAX) 0.5 MG tablet TAKE ONE TABLET BY MOUTH AT BEDTIME AS NEEDED FOR ANXIETY  . amLODipine (NORVASC) 5 MG tablet Take 1 tablet (5 mg total) by mouth daily.  . baclofen (LIORESAL) 10 MG tablet Take 1 tablet (10 mg total) by mouth 2 (two) times daily.  . bisoprolol-hydrochlorothiazide (ZIAC) 10-6.25 MG per tablet Take 1 tablet by mouth 2 (two) times daily.  Marland Kitchen buPROPion (WELLBUTRIN SR) 150 MG 12 hr tablet TAKE ONE TABLET BY  MOUTH TWICE DAILY  . Cholecalciferol (VITAMIN D3) 5000 units CAPS Take by mouth.  . cyanocobalamin 1000 MCG tablet Take by mouth.  Marland Kitchen lisinopril-hydrochlorothiazide (PRINZIDE,ZESTORETIC) 10-12.5 MG per tablet Take 1 tablet by mouth daily.  Marland Kitchen loratadine (CLARITIN) 10 MG tablet Take 1 tablet (10 mg total) by mouth daily.  . meloxicam (MOBIC) 15 MG tablet Take 1 tablet (15 mg total) by mouth daily.  . metFORMIN (GLUCOPHAGE) 1000 MG tablet TAKE ONE TABLET BY MOUTH TWICE DAILY WITH A MEAL  . MULTIPLE VITAMINS PO MULTIVITAMINS (Oral Tablet)  1 Every Day for 0 days  Quantity: 0.00;  Refills: 0   Ordered :17-Apr-2011  [name] ;  Started 24-February-2009 Active  .  rizatriptan (MAXALT) 5 MG tablet Take 1 tablet (5 mg total) by mouth as needed for migraine. May repeat in 2 hours if needed  . simvastatin (ZOCOR) 20 MG tablet Take 1 tablet (20 mg total) by mouth at bedtime.  . sitaGLIPtin (JANUVIA) 100 MG tablet Take 1 tablet (100 mg total) by mouth daily.    Patient Care Team: [name], PA-C as PCP - General (Family Medicine)     Objective:   Vitals: BP 130/80 (BP Location: Right Arm, Patient Position: Sitting, Cuff Size: Normal)   Pulse 66   Temp 98.2 F (36.8 C) (Oral)   Resp 16   Ht 5\' 4"  (1.626 m)   Wt 245 lb (111.1 kg)   BMI 42.05 kg/m    Physical Exam  Constitutional: She is oriented to person, place, and time. She appears well-developed and well-nourished. No distress.  HENT:  Head: Normocephalic and atraumatic.  Right Ear: External ear normal.  Left Ear: External ear normal.  Nose: Nose normal.  Mouth/Throat: Oropharynx is clear and moist. No oropharyngeal exudate.  Eyes: Conjunctivae and EOM are normal. Pupils are equal, round, and reactive to light. Right eye exhibits no discharge. Left eye exhibits no discharge. No scleral icterus.  Neck: Normal range of motion. Neck supple. No JVD present. No tracheal deviation present. No thyromegaly present.  Cardiovascular: Normal rate, regular rhythm, normal heart sounds and intact distal pulses.  Exam reveals no gallop and no friction rub.   No murmur heard. Pulmonary/Chest: Effort normal and breath sounds normal. No respiratory distress. She has no wheezes. She has no rales. She exhibits no tenderness.  Abdominal: Soft. Bowel sounds are normal. She exhibits no distension and no mass. There is no tenderness. There is no rebound and no guarding.  Genitourinary:  Genitourinary Comments: Breast and pelvic with mammogram deferred to GYN at [facility]  Musculoskeletal: Normal range of motion. She exhibits no edema or tenderness.  Lymphadenopathy:    She has no cervical adenopathy.    Neurological: She is alert and oriented to person, place, and time.  Skin: Skin is warm and dry. No rash noted. She is not diaphoretic.  Psychiatric: She has a normal mood and affect. Her behavior is normal. Judgment and thought content normal.  Vitals reviewed.    Depression Screen No flowsheet data found.    Assessment & Plan:     Routine Health Maintenance and Physical Exam  Exercise Activities and Dietary recommendations Goals    None      Immunization History  Administered Date(s) Administered  . Influenza,inj,Quad PF,36+ Mos 06/17/2015    Health Maintenance  Topic Date Due  . Hepatitis C Screening  [birth year]  . PNEUMOCOCCAL POLYSACCHARIDE VACCINE (1) 09/04/1955  . HIV Screening  09/03/1968  . TETANUS/TDAP  09/03/1972  .  PAP SMEAR  09/03/1974  . ZOSTAVAX  09/03/2013  . HEMOGLOBIN A1C  04/02/2016  . MAMMOGRAM  04/15/2016 (Originally 09/04/2003)  . COLONOSCOPY  04/15/2016 (Originally 09/04/2003)  . INFLUENZA VACCINE  04/16/2016  . FOOT EXAM  10/03/2016  . OPHTHALMOLOGY EXAM  03/07/2017      Discussed health benefits of physical activity, and encouraged her to engage in regular exercise appropriate for her age and condition.  1. Annual physical exam Normal physical exam today. Will check labs as below and f/u pending lab results. If labs are stable and WNL she will not need to have these rechecked for one year at her next annual physical exam. She is to call the office in the meantime if she has any acute issue, questions or concerns. - CBC with Differential/Platelet - Comprehensive metabolic panel - TSH  2. Adjustment disorder with anxious mood Stable. Diagnosis pulled for medication refill. Continue current medical treatment plan. - buPROPion (WELLBUTRIN SR) 150 MG 12 hr tablet; Take 1 tablet (150 mg total) by mouth 2 (two) times daily.  Dispense: 180 tablet; Refill: 3 - ALPRAZolam (XANAX) 1 MG tablet; Take 1 tablet (1 mg total) by mouth at bedtime as  needed for anxiety.  Dispense: 90 tablet; Refill: 1  3. Essential hypertension Stable. Diagnosis pulled for medication refill. Continue current medical treatment plan. Will check labs as below and f/u pending results.  - CBC with Differential/Platelet - Comprehensive metabolic panel - amLODipine (NORVASC) 5 MG tablet; Take 1 tablet (5 mg total) by mouth daily.  Dispense: 90 tablet; Refill: 3 - lisinopril-hydrochlorothiazide (PRINZIDE,ZESTORETIC) 10-12.5 MG tablet; Take 1 tablet by mouth daily.  Dispense: 90 tablet; Refill: 3 - bisoprolol-hydrochlorothiazide (ZIAC) 10-6.25 MG tablet; Take 1 tablet by mouth 2 (two) times daily.  Dispense: 180 tablet; Refill: 3  4. Type 2 diabetes mellitus without complication, unspecified long term insulin use status (HCC) Stable. Diagnosis pulled for medication refill. Continue current medical treatment plan. Will check labs as below and f/u pending results. - Hemoglobin A1c - metFORMIN (GLUCOPHAGE) 1000 MG tablet; Take 1 tablet (1,000 mg total) by mouth 2 (two) times daily with a meal.  Dispense: 180 tablet; Refill: 3 - sitaGLIPtin (JANUVIA) 100 MG tablet; Take 1 tablet (100 mg total) by mouth daily.  Dispense: 90 tablet; Refill: 3  5. Hyperlipemia Stable. Diagnosis pulled for medication refill. Continue current medical treatment plan. Will check labs as below and f/u pending results. - Lipid panel - simvastatin (ZOCOR) 20 MG tablet; Take 1 tablet (20 mg total) by mouth at bedtime.  Dispense: 90 tablet; Refill: 3  6. Osteoarthritis of knee, unspecified laterality, unspecified osteoarthritis type Stable. Diagnosis pulled for medication refill. Continue current medical treatment plan. - baclofen (LIORESAL) 10 MG tablet; Take 1 tablet (10 mg total) by mouth 2 (two) times daily.  Dispense: 180 each; Refill: 3 - meloxicam (MOBIC) 15 MG tablet; Take 1 tablet (15 mg total) by mouth daily.  Dispense: 90 tablet; Refill: 3  7. Migraine with aura and without status  migrainosus, not intractable Stable. Diagnosis pulled for medication refill. Continue current medical treatment plan. - rizatriptan (MAXALT) 5 MG tablet; Take 1 tablet (5 mg total) by mouth as needed for migraine. May repeat in 2 hours if needed  Dispense: 10 tablet; Refill: 5  8. Avitaminosis D Will check labs as below and f/u pending results. - Vitamin D (25 hydroxy)   --------------------------------------------------------------------    [name], PA-C  [facility]

## 2016-04-10 NOTE — Patient Instructions (Signed)

## 2016-04-12 ENCOUNTER — Telehealth: Payer: Self-pay

## 2016-04-12 LAB — CBC WITH DIFFERENTIAL/PLATELET
BASOS ABS: 0 10*3/uL (ref 0.0–0.2)
Basos: 0 %
EOS (ABSOLUTE): 1.2 10*3/uL — ABNORMAL HIGH (ref 0.0–0.4)
EOS: 17 %
HEMATOCRIT: 40.8 % (ref 34.0–46.6)
HEMOGLOBIN: 13.3 g/dL (ref 11.1–15.9)
IMMATURE GRANS (ABS): 0 10*3/uL (ref 0.0–0.1)
Immature Granulocytes: 0 %
LYMPHS: 28 %
Lymphocytes Absolute: 2 10*3/uL (ref 0.7–3.1)
MCH: 25.8 pg — AB (ref 26.6–33.0)
MCHC: 32.6 g/dL (ref 31.5–35.7)
MCV: 79 fL (ref 79–97)
MONOCYTES: 7 %
Monocytes Absolute: 0.5 10*3/uL (ref 0.1–0.9)
NEUTROS ABS: 3.6 10*3/uL (ref 1.4–7.0)
Neutrophils: 48 %
Platelets: 262 10*3/uL (ref 150–379)
RBC: 5.16 x10E6/uL (ref 3.77–5.28)
RDW: 14.6 % (ref 12.3–15.4)
WBC: 7.3 10*3/uL (ref 3.4–10.8)

## 2016-04-12 LAB — COMPREHENSIVE METABOLIC PANEL
A/G RATIO: 1.8 (ref 1.2–2.2)
ALT: 19 IU/L (ref 0–32)
AST: 18 IU/L (ref 0–40)
Albumin: 4.4 g/dL (ref 3.6–4.8)
Alkaline Phosphatase: 62 IU/L (ref 39–117)
BILIRUBIN TOTAL: 0.4 mg/dL (ref 0.0–1.2)
BUN/Creatinine Ratio: 25 (ref 12–28)
BUN: 18 mg/dL (ref 8–27)
CALCIUM: 9.9 mg/dL (ref 8.7–10.3)
CHLORIDE: 100 mmol/L (ref 96–106)
CO2: 26 mmol/L (ref 18–29)
Creatinine, Ser: 0.73 mg/dL (ref 0.57–1.00)
GFR, EST AFRICAN AMERICAN: 102 mL/min/{1.73_m2} (ref >59)
GFR, EST NON AFRICAN AMERICAN: 89 mL/min/{1.73_m2} (ref >59)
GLUCOSE: 110 mg/dL — AB (ref 65–99)
Globulin, Total: 2.4 g/dL (ref 1.5–4.5)
POTASSIUM: 4.1 mmol/L (ref 3.5–5.2)
Sodium: 141 mmol/L (ref 134–144)
TOTAL PROTEIN: 6.8 g/dL (ref 6.0–8.5)

## 2016-04-12 LAB — LIPID PANEL
CHOLESTEROL TOTAL: 168 mg/dL (ref 100–199)
Chol/HDL Ratio: 3 ratio units (ref 0.0–4.4)
HDL: 56 mg/dL (ref >39)
LDL CALC: 72 mg/dL (ref 0–99)
TRIGLYCERIDES: 202 mg/dL — AB (ref 0–149)
VLDL Cholesterol Cal: 40 mg/dL (ref 5–40)

## 2016-04-12 LAB — VITAMIN D 25 HYDROXY (VIT D DEFICIENCY, FRACTURES): Vit D, 25-Hydroxy: 40.6 ng/mL (ref 30.0–100.0)

## 2016-04-12 LAB — TSH: TSH: 0.68 u[IU]/mL (ref 0.450–4.500)

## 2016-04-12 LAB — HEMOGLOBIN A1C
ESTIMATED AVERAGE GLUCOSE: 131 mg/dL
Hgb A1c MFr Bld: 6.2 % — ABNORMAL HIGH (ref 4.8–5.6)

## 2016-04-12 NOTE — Telephone Encounter (Signed)
-----   Message from [name], PA-C sent at 04/12/2016  8:35 AM EDT ----- All labs are within normal limits and stable.  Thanks! -JB

## 2016-04-12 NOTE — Telephone Encounter (Signed)
Pt advised.   Thanks,   -Laura  

## 2016-09-19 ENCOUNTER — Ambulatory Visit: Payer: BC Managed Care – PPO | Admitting: Physician Assistant

## 2016-10-10 ENCOUNTER — Ambulatory Visit: Payer: BC Managed Care – PPO | Admitting: Physician Assistant

## 2016-10-17 ENCOUNTER — Encounter: Payer: BC Managed Care – PPO | Admitting: Physician Assistant

## 2016-10-17 ENCOUNTER — Encounter: Payer: Self-pay | Admitting: Physician Assistant

## 2016-10-17 NOTE — Progress Notes (Deleted)
  No chief complaint on file.  HPI  Current Outpatient Prescriptions:  .  ALPRAZolam (XANAX) 1 MG tablet, Take 1 tablet (1 mg total) by mouth at bedtime as needed for anxiety., Disp: 90 tablet, Rfl: 1 .  amLODipine (NORVASC) 5 MG tablet, Take 1 tablet (5 mg total) by mouth daily., Disp: 90 tablet, Rfl: 3 .  baclofen (LIORESAL) 10 MG tablet, Take 1 tablet (10 mg total) by mouth 2 (two) times daily., Disp: 180 each, Rfl: 3 .  bisoprolol-hydrochlorothiazide (ZIAC) 10-6.25 MG tablet, Take 1 tablet by mouth 2 (two) times daily., Disp: 180 tablet, Rfl: 3 .  buPROPion (WELLBUTRIN SR) 150 MG 12 hr tablet, Take 1 tablet (150 mg total) by mouth 2 (two) times daily., Disp: 180 tablet, Rfl: 3 .  Cholecalciferol (VITAMIN D3) 5000 units CAPS, Take by mouth., Disp: , Rfl:  .  cyanocobalamin 1000 MCG tablet, Take by mouth., Disp: , Rfl:  .  lisinopril-hydrochlorothiazide (PRINZIDE,ZESTORETIC) 10-12.5 MG tablet, Take 1 tablet by mouth daily., Disp: 90 tablet, Rfl: 3 .  loratadine (CLARITIN) 10 MG tablet, Take 1 tablet (10 mg total) by mouth daily., Disp: 90 tablet, Rfl: 3 .  meloxicam (MOBIC) 15 MG tablet, Take 1 tablet (15 mg total) by mouth daily., Disp: 90 tablet, Rfl: 3 .  metFORMIN (GLUCOPHAGE) 1000 MG tablet, Take 1 tablet (1,000 mg total) by mouth 2 (two) times daily with a meal., Disp: 180 tablet, Rfl: 3 .  MULTIPLE VITAMINS PO, MULTIVITAMINS (Oral Tablet)  1 Every Day for 0 days  Quantity: 0.00;  Refills: 0   Ordered :17-Apr-2011  [name] ;  Started 24-February-2009 Active, Disp: , Rfl:  .  OMEGA 3-6-9 FATTY ACIDS PO, Reported on 10/04/2015, Disp: , Rfl:  .  rizatriptan (MAXALT) 5 MG tablet, Take 1 tablet (5 mg total) by mouth as needed for migraine. May repeat in 2 hours if needed, Disp: 10 tablet, Rfl: 5 .  simvastatin (ZOCOR) 20 MG tablet, Take 1 tablet (20 mg total) by mouth at bedtime., Disp: 90 tablet, Rfl: 3 .  sitaGLIPtin (JANUVIA) 100 MG tablet, Take 1 tablet (100 mg total) by mouth daily., Disp:  90 tablet, Rfl: 3  Review of Systems Physical Exam  [name], PA-C  [facility]

## 2016-10-20 NOTE — Progress Notes (Signed)
Patient canceled without being seen.

## 2016-10-26 ENCOUNTER — Other Ambulatory Visit: Payer: Self-pay | Admitting: Physician Assistant

## 2016-10-26 DIAGNOSIS — F4322 Adjustment disorder with anxiety: Secondary | ICD-10-CM

## 2016-10-28 ENCOUNTER — Other Ambulatory Visit: Payer: Self-pay | Admitting: Physician Assistant

## 2016-10-28 DIAGNOSIS — F4322 Adjustment disorder with anxiety: Secondary | ICD-10-CM

## 2016-10-28 NOTE — Telephone Encounter (Signed)
Called in to [facility] pharmacy

## 2016-11-14 ENCOUNTER — Ambulatory Visit: Payer: BC Managed Care – PPO | Admitting: Physician Assistant

## 2017-01-08 ENCOUNTER — Ambulatory Visit
Admission: RE | Admit: 2017-01-08 | Discharge: 2017-01-08 | Disposition: A | Discharge status: Home / Self Care | Payer: BC Managed Care – PPO | Source: Ambulatory Visit | Attending: Orthopedic Surgery | Admitting: Orthopedic Surgery

## 2017-01-08 ENCOUNTER — Other Ambulatory Visit: Payer: Self-pay | Admitting: Orthopedic Surgery

## 2017-01-08 DIAGNOSIS — M1711 Unilateral primary osteoarthritis, right knee: Secondary | ICD-10-CM | POA: Diagnosis not present

## 2017-01-08 DIAGNOSIS — M25562 Pain in left knee: Secondary | ICD-10-CM | POA: Insufficient documentation

## 2017-01-08 DIAGNOSIS — M25561 Pain in right knee: Secondary | ICD-10-CM | POA: Diagnosis present

## 2017-01-08 DIAGNOSIS — R609 Edema, unspecified: Secondary | ICD-10-CM

## 2017-01-08 DIAGNOSIS — R52 Pain, unspecified: Secondary | ICD-10-CM

## 2017-04-16 ENCOUNTER — Encounter: Payer: BC Managed Care – PPO | Admitting: Physician Assistant

## 2017-04-21 ENCOUNTER — Telehealth: Payer: Self-pay | Admitting: Physician Assistant

## 2017-04-21 ENCOUNTER — Encounter: Payer: Self-pay | Admitting: Physician Assistant

## 2017-04-21 NOTE — Telephone Encounter (Signed)
Pt is requesting an appointment for the following vaccines.  Please advice if I can schedule for these/MW  Pneumococcal Polysaccharide Vaccine  Hemoglobin A1c

## 2017-04-21 NOTE — Telephone Encounter (Signed)
lmtcb

## 2017-04-21 NOTE — Telephone Encounter (Signed)
Do you recommend these?

## 2017-04-21 NOTE — Telephone Encounter (Signed)
Patient is due for CPE, can address then.

## 2017-04-22 NOTE — Telephone Encounter (Signed)
lmtcb

## 2017-04-23 NOTE — Telephone Encounter (Signed)
Patient is scheduled for a CPE on 05/06/17.

## 2017-05-06 ENCOUNTER — Ambulatory Visit (INDEPENDENT_AMBULATORY_CARE_PROVIDER_SITE_OTHER): Payer: BC Managed Care – PPO | Admitting: Physician Assistant

## 2017-05-06 ENCOUNTER — Encounter: Payer: Self-pay | Admitting: Physician Assistant

## 2017-05-06 VITALS — BP 110/80 | HR 58 | Temp 98.5°F | Wt 245.6 lb

## 2017-05-06 DIAGNOSIS — Z1231 Encounter for screening mammogram for malignant neoplasm of breast: Secondary | ICD-10-CM

## 2017-05-06 DIAGNOSIS — M179 Osteoarthritis of knee, unspecified: Secondary | ICD-10-CM

## 2017-05-06 DIAGNOSIS — M1710 Unilateral primary osteoarthritis, unspecified knee: Secondary | ICD-10-CM | POA: Diagnosis not present

## 2017-05-06 DIAGNOSIS — E559 Vitamin D deficiency, unspecified: Secondary | ICD-10-CM

## 2017-05-06 DIAGNOSIS — E119 Type 2 diabetes mellitus without complications: Secondary | ICD-10-CM

## 2017-05-06 DIAGNOSIS — Z1211 Encounter for screening for malignant neoplasm of colon: Secondary | ICD-10-CM

## 2017-05-06 DIAGNOSIS — G43109 Migraine with aura, not intractable, without status migrainosus: Secondary | ICD-10-CM | POA: Diagnosis not present

## 2017-05-06 DIAGNOSIS — F4322 Adjustment disorder with anxiety: Secondary | ICD-10-CM

## 2017-05-06 DIAGNOSIS — Z1159 Encounter for screening for other viral diseases: Secondary | ICD-10-CM | POA: Diagnosis not present

## 2017-05-06 DIAGNOSIS — E7800 Pure hypercholesterolemia, unspecified: Secondary | ICD-10-CM

## 2017-05-06 DIAGNOSIS — Z0000 Encounter for general adult medical examination without abnormal findings: Secondary | ICD-10-CM | POA: Diagnosis not present

## 2017-05-06 DIAGNOSIS — I10 Essential (primary) hypertension: Secondary | ICD-10-CM | POA: Diagnosis not present

## 2017-05-06 DIAGNOSIS — Z114 Encounter for screening for human immunodeficiency virus [HIV]: Secondary | ICD-10-CM

## 2017-05-06 DIAGNOSIS — Z1239 Encounter for other screening for malignant neoplasm of breast: Secondary | ICD-10-CM

## 2017-05-06 MED ORDER — LISINOPRIL-HYDROCHLOROTHIAZIDE 10-12.5 MG PO TABS: 1.0000 | ORAL_TABLET | Freq: Every day | ORAL | 3 refills | Status: DC

## 2017-05-06 MED ORDER — METFORMIN HCL 1000 MG PO TABS: 1000.0000 mg | ORAL_TABLET | Freq: Two times a day (BID) | ORAL | 3 refills | Status: DC

## 2017-05-06 MED ORDER — BUPROPION HCL ER (SR) 150 MG PO TB12: 150.0000 mg | ORAL_TABLET | Freq: Two times a day (BID) | ORAL | 3 refills | Status: DC

## 2017-05-06 MED ORDER — BACLOFEN 10 MG PO TABS: 10.0000 mg | ORAL_TABLET | Freq: Two times a day (BID) | ORAL | 3 refills | Status: DC

## 2017-05-06 MED ORDER — SITAGLIPTIN PHOSPHATE 100 MG PO TABS: 100.0000 mg | ORAL_TABLET | Freq: Every day | ORAL | 3 refills | Status: DC

## 2017-05-06 MED ORDER — ALPRAZOLAM 1 MG PO TABS: ORAL_TABLET | ORAL | 1 refills | Status: DC

## 2017-05-06 MED ORDER — AMLODIPINE BESYLATE 5 MG PO TABS: 5.0000 mg | ORAL_TABLET | Freq: Every day | ORAL | 3 refills | Status: DC

## 2017-05-06 MED ORDER — BISOPROLOL-HYDROCHLOROTHIAZIDE 10-6.25 MG PO TABS: 1.0000 | ORAL_TABLET | Freq: Two times a day (BID) | ORAL | 3 refills | Status: DC

## 2017-05-06 MED ORDER — MELOXICAM 15 MG PO TABS: 15.0000 mg | ORAL_TABLET | Freq: Every day | ORAL | 3 refills | Status: DC

## 2017-05-06 MED ORDER — RIZATRIPTAN BENZOATE 5 MG PO TABS: 5.0000 mg | ORAL_TABLET | ORAL | 5 refills | Status: DC | PRN

## 2017-05-06 MED ORDER — SIMVASTATIN 20 MG PO TABS: 20.0000 mg | ORAL_TABLET | Freq: Every day | ORAL | 3 refills | Status: DC

## 2017-05-06 MED ORDER — LORATADINE 10 MG PO TABS: 10.0000 mg | ORAL_TABLET | Freq: Every day | ORAL | 3 refills | Status: DC

## 2017-05-06 NOTE — Patient Instructions (Signed)
Health Maintenance for Postmenopausal Women Menopause is a normal process in which your reproductive ability comes to an end. This process happens gradually over a span of months to years, usually between the ages of 22 and 9. Menopause is complete when you have missed 12 consecutive menstrual periods. It is important to talk with your health care provider about some of the most common conditions that affect postmenopausal women, such as heart disease, cancer, and bone loss (osteoporosis). Adopting a healthy lifestyle and getting preventive care can help to promote your health and wellness. Those actions can also lower your chances of developing some of these common conditions. What should I know about menopause? During menopause, you may experience a number of symptoms, such as:  Moderate-to-severe hot flashes.  Night sweats.  Decrease in sex drive.  Mood swings.  Headaches.  Tiredness.  Irritability.  Memory problems.  Insomnia.  Choosing to treat or not to treat menopausal changes is an individual decision that you make with your health care provider. What should I know about hormone replacement therapy and supplements? Hormone therapy products are effective for treating symptoms that are associated with menopause, such as hot flashes and night sweats. Hormone replacement carries certain risks, especially as you become older. If you are thinking about using estrogen or estrogen with progestin treatments, discuss the benefits and risks with your health care provider. What should I know about heart disease and stroke? Heart disease, heart attack, and stroke become more likely as you age. This may be due, in part, to the hormonal changes that your body experiences during menopause. These can affect how your body processes dietary fats, triglycerides, and cholesterol. Heart attack and stroke are both medical emergencies. There are many things that you can do to help prevent heart disease  and stroke:  Have your blood pressure checked at least every 1-2 years. High blood pressure causes heart disease and increases the risk of stroke.  If you are 53-22 years old, ask your health care provider if you should take aspirin to prevent a heart attack or a stroke.  Do not use any tobacco products, including cigarettes, chewing tobacco, or electronic cigarettes. If you need help quitting, ask your health care provider.  It is important to eat a healthy diet and maintain a healthy weight. ? Be sure to include plenty of vegetables, fruits, low-fat dairy products, and lean protein. ? Avoid eating foods that are high in solid fats, added sugars, or salt (sodium).  Get regular exercise. This is one of the most important things that you can do for your health. ? Try to exercise for at least 150 minutes each week. The type of exercise that you do should increase your heart rate and make you sweat. This is known as moderate-intensity exercise. ? Try to do strengthening exercises at least twice each week. Do these in addition to the moderate-intensity exercise.  Know your numbers.Ask your health care provider to check your cholesterol and your blood glucose. Continue to have your blood tested as directed by your health care provider.  What should I know about cancer screening? There are several types of cancer. Take the following steps to reduce your risk and to catch any cancer development as early as possible. Breast Cancer  Practice breast self-awareness. ? This means understanding how your breasts normally appear and feel. ? It also means doing regular breast self-exams. Let your health care provider know about any changes, no matter how small.  If you are 40  or older, have a clinician do a breast exam (clinical breast exam or CBE) every year. Depending on your age, family history, and medical history, it may be recommended that you also have a yearly breast X-ray (mammogram).  If you  have a family history of breast cancer, talk with your health care provider about genetic screening.  If you are at high risk for breast cancer, talk with your health care provider about having an MRI and a mammogram every year.  Breast cancer (BRCA) gene test is recommended for women who have family members with BRCA-related cancers. Results of the assessment will determine the need for genetic counseling and BRCA1 and for BRCA2 testing. BRCA-related cancers include these types: ? Breast. This occurs in males or females. ? Ovarian. ? Tubal. This may also be called fallopian tube cancer. ? Cancer of the abdominal or pelvic lining (peritoneal cancer). ? Prostate. ? Pancreatic.  Cervical, Uterine, and Ovarian Cancer Your health care provider may recommend that you be screened regularly for cancer of the pelvic organs. These include your ovaries, uterus, and vagina. This screening involves a pelvic exam, which includes checking for microscopic changes to the surface of your cervix (Pap test).  For women ages 21-65, health care providers may recommend a pelvic exam and a Pap test every three years. For women ages 79-65, they may recommend the Pap test and pelvic exam, combined with testing for human papilloma virus (HPV), every five years. Some types of HPV increase your risk of cervical cancer. Testing for HPV may also be done on women of any age who have unclear Pap test results.  Other health care providers may not recommend any screening for nonpregnant women who are considered low risk for pelvic cancer and have no symptoms. Ask your health care provider if a screening pelvic exam is right for you.  If you have had past treatment for cervical cancer or a condition that could lead to cancer, you need Pap tests and screening for cancer for at least 20 years after your treatment. If Pap tests have been discontinued for you, your risk factors (such as having a new sexual partner) need to be  reassessed to determine if you should start having screenings again. Some women have medical problems that increase the chance of getting cervical cancer. In these cases, your health care provider may recommend that you have screening and Pap tests more often.  If you have a family history of uterine cancer or ovarian cancer, talk with your health care provider about genetic screening.  If you have vaginal bleeding after reaching menopause, tell your health care provider.  There are currently no reliable tests available to screen for ovarian cancer.  Lung Cancer Lung cancer screening is recommended for adults 69-62 years old who are at high risk for lung cancer because of a history of smoking. A yearly low-dose CT scan of the lungs is recommended if you:  Currently smoke.  Have a history of at least 30 pack-years of smoking and you currently smoke or have quit within the past 15 years. A pack-year is smoking an average of one pack of cigarettes per day for one year.  Yearly screening should:  Continue until it has been 15 years since you quit.  Stop if you develop a health problem that would prevent you from having lung cancer treatment.  Colorectal Cancer  This type of cancer can be detected and can often be prevented.  Routine colorectal cancer screening usually begins at  age 42 and continues through age 45.  If you have risk factors for colon cancer, your health care provider may recommend that you be screened at an earlier age.  If you have a family history of colorectal cancer, talk with your health care provider about genetic screening.  Your health care provider may also recommend using home test kits to check for hidden blood in your stool.  A small camera at the end of a tube can be used to examine your colon directly (sigmoidoscopy or colonoscopy). This is done to check for the earliest forms of colorectal cancer.  Direct examination of the colon should be repeated every  5-10 years until age 71. However, if early forms of precancerous polyps or small growths are found or if you have a family history or genetic risk for colorectal cancer, you may need to be screened more often.  Skin Cancer  Check your skin from head to toe regularly.  Monitor any moles. Be sure to tell your health care provider: ? About any new moles or changes in moles, especially if there is a change in a mole's shape or color. ? If you have a mole that is larger than the size of a pencil eraser.  If any of your family members has a history of skin cancer, especially at a young age, talk with your health care provider about genetic screening.  Always use sunscreen. Apply sunscreen liberally and repeatedly throughout the day.  Whenever you are outside, protect yourself by wearing long sleeves, pants, a wide-brimmed hat, and sunglasses.  What should I know about osteoporosis? Osteoporosis is a condition in which bone destruction happens more quickly than new bone creation. After menopause, you may be at an increased risk for osteoporosis. To help prevent osteoporosis or the bone fractures that can happen because of osteoporosis, the following is recommended:  If you are 46-71 years old, get at least 1,000 mg of calcium and at least 600 mg of vitamin D per day.  If you are older than age 55 but younger than age 65, get at least 1,200 mg of calcium and at least 600 mg of vitamin D per day.  If you are older than age 54, get at least 1,200 mg of calcium and at least 800 mg of vitamin D per day.  Smoking and excessive alcohol intake increase the risk of osteoporosis. Eat foods that are rich in calcium and vitamin D, and do weight-bearing exercises several times each week as directed by your health care provider. What should I know about how menopause affects my mental health? Depression may occur at any age, but it is more common as you become older. Common symptoms of depression  include:  Low or sad mood.  Changes in sleep patterns.  Changes in appetite or eating patterns.  Feeling an overall lack of motivation or enjoyment of activities that you previously enjoyed.  Frequent crying spells.  Talk with your health care provider if you think that you are experiencing depression. What should I know about immunizations? It is important that you get and maintain your immunizations. These include:  Tetanus, diphtheria, and pertussis (Tdap) booster vaccine.  Influenza every year before the flu season begins.  Pneumonia vaccine.  Shingles vaccine.  Your health care provider may also recommend other immunizations. This information is not intended to replace advice given to you by your health care provider. Make sure you discuss any questions you have with your health care provider. Document Released: 10/25/2005  Document Revised: 03/22/2016 Document Reviewed: 06/06/2015 Elsevier Interactive Patient Education  2018 Elsevier Inc.  

## 2017-05-06 NOTE — Progress Notes (Signed)
Patient: Nichole Martinez, Female    DOB: 03-17-1951, 66 y.o.   MRN: [id number] Visit Date: 05/06/2017  Today's Provider: [name], PA-C   Chief Complaint  Patient presents with  . Annual Exam   Subjective:    Annual physical exam Nichole Martinez is a 66 y.o. female who presents today for health maintenance and complete physical. She feels well. She reports exercising none. She reports she is sleeping well uses Xanax to help her sleep. ----------------------------------------------------------------- Mammo- Pt states she never gets a mammogram Pap- 2 years ago at GYN in [facility] per pt.  Colonoscopy- Declined Pneumovax- [facility] in 2010 Tdap- [facility] in 2011  Review of Systems  Constitutional: Negative.   HENT: Negative.   Eyes: Negative.   Respiratory: Negative.   Cardiovascular: Negative.   Gastrointestinal: Negative.   Endocrine: Negative.   Genitourinary: Negative.   Musculoskeletal: Positive for joint swelling.  Skin: Negative.   Allergic/Immunologic: Negative.   Neurological: Negative.   Hematological: Negative.   Psychiatric/Behavioral: Negative.     Social History      She  reports that she quit smoking about 17 years ago. She has never used smokeless tobacco. She reports that she does not drink alcohol or use drugs.       Social History   Social History  . Marital status: Divorced    Spouse name: N/A  . Number of children: 1  . Years of education: N/A   Occupational History  . RN [employer]   Social History Main Topics  . Smoking status: Former Smoker    Quit date: 03/16/2000  . Smokeless tobacco: Never Used  . Alcohol use No  . Drug use: No  . Sexual activity: Not Asked   Other Topics Concern  . None   Social History Narrative  . None    No past medical history on file.   Patient Active Problem List   Diagnosis Date Noted  . Migraine 10/04/2015  . Hyperlipemia 04/05/2015  . Allergic rhinitis 01/31/2015  . Arthritis  of knee, degenerative 01/31/2015  . Cannot sleep 01/31/2015  . Avitaminosis D 01/31/2015  . Adiposity 02/24/2009  . Diabetes mellitus, type 2 ([place]) 03/07/2008  . Adaptation reaction 02/19/2008  . Fothergill's neuralgia 01/16/2007  . Acid reflux 03/09/2003  . BP (high blood pressure) 01/28/2003    Past Surgical History:  Procedure Laterality Date  . ABDOMINAL HYSTERECTOMY  1978  . LAMINECTOMY  1995    Family History        No family status information on file.        Her She was adopted. Family history is unknown by patient.     Allergies  Allergen Reactions  . Morphine     Anaphallaxis.     Current Outpatient Prescriptions:  .  ALPRAZolam (XANAX) 1 MG tablet, TAKE ONE TABLET BY MOUTH AT BEDTIME AS NEEDED FOR ANXIETY, Disp: 90 tablet, Rfl: 1 .  amLODipine (NORVASC) 5 MG tablet, Take 1 tablet (5 mg total) by mouth daily., Disp: 90 tablet, Rfl: 3 .  baclofen (LIORESAL) 10 MG tablet, Take 1 tablet (10 mg total) by mouth 2 (two) times daily., Disp: 180 each, Rfl: 3 .  bisoprolol-hydrochlorothiazide (ZIAC) 10-6.25 MG tablet, Take 1 tablet by mouth 2 (two) times daily., Disp: 180 tablet, Rfl: 3 .  buPROPion (WELLBUTRIN SR) 150 MG 12 hr tablet, Take 1 tablet (150 mg total) by mouth 2 (two) times daily., Disp: 180 tablet, Rfl:  3 .  Cholecalciferol (VITAMIN D3) 5000 units CAPS, Take by mouth., Disp: , Rfl:  .  cyanocobalamin 1000 MCG tablet, Take by mouth., Disp: , Rfl:  .  lisinopril-hydrochlorothiazide (PRINZIDE,ZESTORETIC) 10-12.5 MG tablet, Take 1 tablet by mouth daily., Disp: 90 tablet, Rfl: 3 .  loratadine (CLARITIN) 10 MG tablet, Take 1 tablet (10 mg total) by mouth daily., Disp: 90 tablet, Rfl: 3 .  meloxicam (MOBIC) 15 MG tablet, Take 1 tablet (15 mg total) by mouth daily., Disp: 90 tablet, Rfl: 3 .  metFORMIN (GLUCOPHAGE) 1000 MG tablet, Take 1 tablet (1,000 mg total) by mouth 2 (two) times daily with a meal., Disp: 180 tablet, Rfl: 3 .  MULTIPLE VITAMINS PO, MULTIVITAMINS  (Oral Tablet)  1 Every Day for 0 days  Quantity: 0.00;  Refills: 0   Ordered :17-Apr-2011  [name] ;  Started 24-February-2009 Active, Disp: , Rfl:  .  OMEGA 3-6-9 FATTY ACIDS PO, Reported on 10/04/2015, Disp: , Rfl:  .  rizatriptan (MAXALT) 5 MG tablet, Take 1 tablet (5 mg total) by mouth as needed for migraine. May repeat in 2 hours if needed, Disp: 10 tablet, Rfl: 5 .  simvastatin (ZOCOR) 20 MG tablet, Take 1 tablet (20 mg total) by mouth at bedtime., Disp: 90 tablet, Rfl: 3 .  sitaGLIPtin (JANUVIA) 100 MG tablet, Take 1 tablet (100 mg total) by mouth daily., Disp: 90 tablet, Rfl: 3   Patient Care Team: [name], PA-C as PCP - General (Family Medicine)      Objective:   Vitals: BP 110/80 (BP Location: Left Arm, Patient Position: Sitting, Cuff Size: Normal)   Pulse (!) 58   Temp 98.5 F (36.9 C) (Oral)   Wt 245 lb 9.6 oz (111.4 kg)   SpO2 96%   BMI 42.16 kg/m    Vitals:   05/06/17 0943  BP: 110/80  Pulse: (!) 58  Temp: 98.5 F (36.9 C)  TempSrc: Oral  SpO2: 96%  Weight: 245 lb 9.6 oz (111.4 kg)     Physical Exam  Constitutional: She is oriented to person, place, and time. She appears well-developed and well-nourished. No distress.  HENT:  Head: Normocephalic and atraumatic.  Right Ear: Hearing, tympanic membrane, external ear and ear canal normal.  Left Ear: Hearing, tympanic membrane, external ear and ear canal normal.  Nose: Nose normal.  Mouth/Throat: Uvula is midline, oropharynx is clear and moist and mucous membranes are normal. No oropharyngeal exudate.  Eyes: Pupils are equal, round, and reactive to light. Conjunctivae and EOM are normal. Right eye exhibits no discharge. Left eye exhibits no discharge. No scleral icterus.  Neck: Normal range of motion. Neck supple. No JVD present. No tracheal deviation present. No thyromegaly present.  Cardiovascular: Normal rate, regular rhythm, normal heart sounds and intact distal pulses.  Exam reveals no gallop and no  friction rub.   No murmur heard. Pulmonary/Chest: Effort normal and breath sounds normal. No respiratory distress. She has no wheezes. She has no rales. She exhibits no tenderness.  Abdominal: Soft. Bowel sounds are normal. She exhibits no distension and no mass. There is no tenderness. There is no rebound and no guarding.  Musculoskeletal: Normal range of motion. She exhibits no edema or tenderness.  Lymphadenopathy:    She has no cervical adenopathy.  Neurological: She is alert and oriented to person, place, and time.  Skin: Skin is warm and dry. No rash noted. She is not diaphoretic.  Psychiatric: She has a normal mood and affect. Her behavior  is normal. Judgment and thought content normal.  Vitals reviewed.  Diabetic Foot Exam - Simple   Simple Foot Form Diabetic Foot exam was performed with the following findings:  Yes 05/06/2017 10:00 AM  Visual Inspection No deformities, no ulcerations, no other skin breakdown bilaterally:  Yes Sensation Testing Intact to touch and monofilament testing bilaterally:  Yes Pulse Check Posterior Tibialis and Dorsalis pulse intact bilaterally:  Yes Comments    Depression Screen PHQ 2/9 Scores 05/06/2017  PHQ - 2 Score 0  PHQ- 9 Score 0     Assessment & Plan:     Routine Health Maintenance and Physical Exam  Exercise Activities and Dietary recommendations Goals    None      Immunization History  Administered Date(s) Administered  . Influenza,inj,Quad PF,36+ Mos 06/17/2015    Health Maintenance  Topic Date Due  . Hepatitis C Screening  01-08-1951  . PNEUMOCOCCAL POLYSACCHARIDE VACCINE (1) 09/04/1955  . HIV Screening  09/03/1968  . TETANUS/TDAP  09/03/1972  . PAP SMEAR  09/03/1974  . MAMMOGRAM  09/04/2003  . COLONOSCOPY  09/04/2003  . FOOT EXAM  10/03/2016  . HEMOGLOBIN A1C  10/12/2016  . OPHTHALMOLOGY EXAM  03/07/2017  . INFLUENZA VACCINE  04/16/2017     Discussed health benefits of physical activity, and encouraged her to  engage in regular exercise appropriate for her age and condition.    1. Annual physical exam Normal physical exam today. Will check labs as below and f/u pending lab results. If labs are stable and WNL she will not need to have these rechecked for one year at her next annual physical exam. She is to call the office in the meantime if she has any acute issue, questions or concerns. - TSH  2. Breast cancer screening Patient declines. She does perform self breast exams.  3. Colon cancer screening Patient declines.  4. Essential hypertension Stable. Diagnosis pulled for medication refill. Continue current medical treatment plan. Will check labs as below and f/u pending results. - Comprehensive metabolic panel - HgB O2D - amLODipine (NORVASC) 5 MG tablet; Take 1 tablet (5 mg total) by mouth daily.  Dispense: 90 tablet; Refill: 3 - bisoprolol-hydrochlorothiazide (ZIAC) 10-6.25 MG tablet; Take 1 tablet by mouth 2 (two) times daily.  Dispense: 180 tablet; Refill: 3 - lisinopril-hydrochlorothiazide (PRINZIDE,ZESTORETIC) 10-12.5 MG tablet; Take 1 tablet by mouth daily.  Dispense: 90 tablet; Refill: 3  5. Type 2 diabetes mellitus without complication, without long-term current use of insulin (HCC) Stable. Diagnosis pulled for medication refill. Continue current medical treatment plan. Will check labs as below and f/u pending results. - Comprehensive metabolic panel - HgB X4J - metFORMIN (GLUCOPHAGE) 1000 MG tablet; Take 1 tablet (1,000 mg total) by mouth 2 (two) times daily with a meal.  Dispense: 180 tablet; Refill: 3 - sitaGLIPtin (JANUVIA) 100 MG tablet; Take 1 tablet (100 mg total) by mouth daily.  Dispense: 90 tablet; Refill: 3  6. Pure hypercholesterolemia Stable. Diagnosis pulled for medication refill. Continue current medical treatment plan. Will check labs as below and f/u pending results. - Lipid panel - Comprehensive metabolic panel - HgB O8N - simvastatin (ZOCOR) 20 MG tablet; Take 1  tablet (20 mg total) by mouth at bedtime.  Dispense: 90 tablet; Refill: 3  7. Avitaminosis D Will check labs as below and f/u pending results. - Vitamin D (25 hydroxy)  8. Need for hepatitis C screening test - Hepatitis C Antibody  9. Encounter for screening for HIV - HIV antibody  10.  Adjustment disorder with anxious mood Stable. Diagnosis pulled for medication refill. Continue current medical treatment plan. - ALPRAZolam (XANAX) 1 MG tablet; TAKE ONE TABLET BY MOUTH AT BEDTIME AS NEEDED FOR ANXIETY  Dispense: 90 tablet; Refill: 1 - buPROPion (WELLBUTRIN SR) 150 MG 12 hr tablet; Take 1 tablet (150 mg total) by mouth 2 (two) times daily.  Dispense: 180 tablet; Refill: 3  11. Osteoarthritis of knee, unspecified laterality, unspecified osteoarthritis type Stable. Diagnosis pulled for medication refill. Continue current medical treatment plan. - baclofen (LIORESAL) 10 MG tablet; Take 1 tablet (10 mg total) by mouth 2 (two) times daily.  Dispense: 180 each; Refill: 3 - meloxicam (MOBIC) 15 MG tablet; Take 1 tablet (15 mg total) by mouth daily.  Dispense: 90 tablet; Refill: 3  12. Migraine with aura and without status migrainosus, not intractable Stable. Diagnosis pulled for medication refill. Continue current medical treatment plan. - rizatriptan (MAXALT) 5 MG tablet; Take 1 tablet (5 mg total) by mouth as needed for migraine. May repeat in 2 hours if needed  Dispense: 10 tablet; Refill: 5  --------------------------------------------------------------------    [name], PA-C  [facility]

## 2017-05-07 ENCOUNTER — Telehealth: Payer: Self-pay

## 2017-05-07 DIAGNOSIS — E559 Vitamin D deficiency, unspecified: Secondary | ICD-10-CM

## 2017-05-07 LAB — COMPREHENSIVE METABOLIC PANEL
A/G RATIO: 1.8 (ref 1.2–2.2)
ALT: 18 IU/L (ref 0–32)
AST: 15 IU/L (ref 0–40)
Albumin: 4.6 g/dL (ref 3.6–4.8)
Alkaline Phosphatase: 67 IU/L (ref 39–117)
BUN/Creatinine Ratio: 32 — ABNORMAL HIGH (ref 12–28)
BUN: 21 mg/dL (ref 8–27)
Bilirubin Total: 0.4 mg/dL (ref 0.0–1.2)
CALCIUM: 10.2 mg/dL (ref 8.7–10.3)
CO2: 23 mmol/L (ref 20–29)
CREATININE: 0.66 mg/dL (ref 0.57–1.00)
Chloride: 103 mmol/L (ref 96–106)
GFR, EST AFRICAN AMERICAN: 109 mL/min/{1.73_m2} (ref >59)
GFR, EST NON AFRICAN AMERICAN: 94 mL/min/{1.73_m2} (ref >59)
Globulin, Total: 2.5 g/dL (ref 1.5–4.5)
Glucose: 109 mg/dL — ABNORMAL HIGH (ref 65–99)
POTASSIUM: 4.5 mmol/L (ref 3.5–5.2)
Sodium: 143 mmol/L (ref 134–144)
TOTAL PROTEIN: 7.1 g/dL (ref 6.0–8.5)

## 2017-05-07 LAB — LIPID PANEL
CHOLESTEROL TOTAL: 180 mg/dL (ref 100–199)
Chol/HDL Ratio: 3 ratio (ref 0.0–4.4)
HDL: 60 mg/dL (ref >39)
LDL Calculated: 71 mg/dL (ref 0–99)
TRIGLYCERIDES: 243 mg/dL — AB (ref 0–149)
VLDL Cholesterol Cal: 49 mg/dL — ABNORMAL HIGH (ref 5–40)

## 2017-05-07 LAB — VITAMIN D 25 HYDROXY (VIT D DEFICIENCY, FRACTURES): Vit D, 25-Hydroxy: 29.9 ng/mL — ABNORMAL LOW (ref 30.0–100.0)

## 2017-05-07 LAB — HEMOGLOBIN A1C
Est. average glucose Bld gHb Est-mCnc: 137 mg/dL
HEMOGLOBIN A1C: 6.4 % — AB (ref 4.8–5.6)

## 2017-05-07 LAB — TSH: TSH: 1.12 u[IU]/mL (ref 0.450–4.500)

## 2017-05-07 LAB — HEPATITIS C ANTIBODY: Hep C Virus Ab: <0.1 s/co ratio (ref 0.0–0.9)

## 2017-05-07 LAB — HIV ANTIBODY (ROUTINE TESTING W REFLEX): HIV SCREEN 4TH GENERATION: NONREACTIVE

## 2017-05-07 MED ORDER — VITAMIN D (ERGOCALCIFEROL) 1.25 MG (50000 UNIT) PO CAPS: 50000.0000 [IU] | ORAL_CAPSULE | ORAL | 4 refills | Status: DC

## 2017-05-07 NOTE — Telephone Encounter (Signed)
-----   Message from [name], [place] sent at 05/07/2017 11:15 AM EDT ----- Triglycerides up some from last year, was 202 now 243. Cholesterol up to 180 from 168 and LDL stable at 71. HDL up from 56 to 60!Marland Kitchen A1c up from 6.2 to 6.4. Vit D low at 29. If taking the daily OTC supplementation we can increase to the high dose 50,000 weekly to boost Vit D. Please let me know if you wish to increase Vit D dosage. Continue to work on healthy lifestyle changes for cholesterol increases and A1c increases.

## 2017-05-07 NOTE — Telephone Encounter (Signed)
Sent in Rx

## 2017-05-07 NOTE — Telephone Encounter (Signed)
Patient advised.

## 2017-05-07 NOTE — Telephone Encounter (Signed)
Advised patient of results. Patient would like to start on high dose vit d supplement. Could you send this in for her? She uses [facility] on [address]. Thanks!

## 2017-11-06 ENCOUNTER — Other Ambulatory Visit: Payer: Self-pay | Admitting: Physician Assistant

## 2017-11-06 ENCOUNTER — Encounter: Payer: Self-pay | Admitting: Physician Assistant

## 2017-11-06 ENCOUNTER — Ambulatory Visit: Payer: BC Managed Care – PPO | Admitting: Physician Assistant

## 2017-11-06 VITALS — BP 130/80 | HR 61 | Temp 98.7°F | Resp 16 | Wt 243.8 lb

## 2017-11-06 DIAGNOSIS — E119 Type 2 diabetes mellitus without complications: Secondary | ICD-10-CM | POA: Diagnosis not present

## 2017-11-06 DIAGNOSIS — E7800 Pure hypercholesterolemia, unspecified: Secondary | ICD-10-CM

## 2017-11-06 DIAGNOSIS — F4322 Adjustment disorder with anxiety: Secondary | ICD-10-CM | POA: Diagnosis not present

## 2017-11-06 DIAGNOSIS — I10 Essential (primary) hypertension: Secondary | ICD-10-CM

## 2017-11-06 MED ORDER — ALPRAZOLAM 1 MG PO TABS: ORAL_TABLET | ORAL | 1 refills | Status: DC

## 2017-11-06 NOTE — Progress Notes (Signed)
Patient: Nichole Martinez Female    DOB: 03/24/1951   67 y.o.   MRN: [id number] Visit Date: 11/06/2017  Today's Provider: [name], PA-C   Chief Complaint  Patient presents with  . Follow-up    HTN,DM,LIPIDS   Subjective:    HPI  Diabetes Mellitus Type II, Follow-up:   Lab Results  Component Value Date   HGBA1C 6.4 (H) 05/06/2017   HGBA1C 6.2 (H) 04/11/2016   HGBA1C 6.2 10/04/2015   Last seen for diabetes 6 months ago.  Management since then includes Continue to work on healthy lifestyle changes for cholesterol increases and A1c increases. She reports excellent compliance with treatment. She is not having side effects.  Current symptoms include none and have been stable. Home blood sugar records: fasting range: 99-105 after dinner 135-140  Episodes of hypoglycemia? no   Most Recent Eye Exam: 11/2016 Weight trend: stable Current diet: in general, a "healthy" diet   Current exercise: walking  ------------------------------------------------------------------------   Hypertension, follow-up:  BP Readings from Last 3 Encounters:  11/06/17 130/80  05/06/17 110/80  04/10/16 130/80    She was last seen for hypertension 6 months ago.  BP at that visit was 110/80. Management since that visit includes none.She reports excellent compliance with treatment. She is not having side effects.  She is adherent to low salt diet.   She is experiencing none.  Patient denies chest pain, chest pressure/discomfort, exertional chest pressure/discomfort, fatigue, irregular heart beat, lower extremity edema and palpitations.   Cardiovascular risk factors include diabetes mellitus, dyslipidemia and hypertension.   ------------------------------------------------------------------------    Lipid/Cholesterol, Follow-up:   Last seen for this 6 months ago.  Management since that visit includes Continue to work on healthy lifestyle changes for cholesterol increases  and A1c increases.  Last Lipid Panel:    Component Value Date/Time   CHOL 180 05/06/2017 1028   TRIG 243 (H) 05/06/2017 1028   HDL 60 05/06/2017 1028   CHOLHDL 3.0 05/06/2017 1028   LDLCALC 71 05/06/2017 1028    She reports excellent compliance with treatment. She is not having side effects.   Wt Readings from Last 3 Encounters:  11/06/17 243 lb 12.8 oz (110.6 kg)  05/06/17 245 lb 9.6 oz (111.4 kg)  04/10/16 245 lb (111.1 kg)    ------------------------------------------------------------------------     Allergies  Allergen Reactions  . Morphine     Anaphallaxis.     Current Outpatient Medications:  .  ALPRAZolam (XANAX) 1 MG tablet, TAKE ONE TABLET BY MOUTH AT BEDTIME AS NEEDED FOR ANXIETY, Disp: 90 tablet, Rfl: 1 .  amLODipine (NORVASC) 5 MG tablet, Take 1 tablet (5 mg total) by mouth daily., Disp: 90 tablet, Rfl: 3 .  baclofen (LIORESAL) 10 MG tablet, Take 1 tablet (10 mg total) by mouth 2 (two) times daily., Disp: 180 each, Rfl: 3 .  bisoprolol-hydrochlorothiazide (ZIAC) 10-6.25 MG tablet, Take 1 tablet by mouth 2 (two) times daily., Disp: 180 tablet, Rfl: 3 .  buPROPion (WELLBUTRIN SR) 150 MG 12 hr tablet, Take 1 tablet (150 mg total) by mouth 2 (two) times daily., Disp: 180 tablet, Rfl: 3 .  Cholecalciferol (VITAMIN D3) 5000 units CAPS, Take by mouth., Disp: , Rfl:  .  cyanocobalamin 1000 MCG tablet, Take by mouth., Disp: , Rfl:  .  lisinopril-hydrochlorothiazide (PRINZIDE,ZESTORETIC) 10-12.5 MG tablet, Take 1 tablet by mouth daily., Disp: 90 tablet, Rfl: 3 .  loratadine (CLARITIN) 10 MG tablet, Take 1 tablet (10 mg total) by  mouth daily., Disp: 90 tablet, Rfl: 3 .  meloxicam (MOBIC) 15 MG tablet, Take 1 tablet (15 mg total) by mouth daily., Disp: 90 tablet, Rfl: 3 .  metFORMIN (GLUCOPHAGE) 1000 MG tablet, Take 1 tablet (1,000 mg total) by mouth 2 (two) times daily with a meal., Disp: 180 tablet, Rfl: 3 .  MULTIPLE VITAMINS PO, MULTIVITAMINS (Oral Tablet)  1 Every  Day for 0 days  Quantity: 0.00;  Refills: 0   Ordered :17-Apr-2011  [name] ;  Started 24-February-2009 Active, Disp: , Rfl:  .  OMEGA 3-6-9 FATTY ACIDS PO, Reported on 10/04/2015, Disp: , Rfl:  .  rizatriptan (MAXALT) 5 MG tablet, Take 1 tablet (5 mg total) by mouth as needed for migraine. May repeat in 2 hours if needed, Disp: 10 tablet, Rfl: 5 .  simvastatin (ZOCOR) 20 MG tablet, Take 1 tablet (20 mg total) by mouth at bedtime., Disp: 90 tablet, Rfl: 3 .  sitaGLIPtin (JANUVIA) 100 MG tablet, Take 1 tablet (100 mg total) by mouth daily., Disp: 90 tablet, Rfl: 3 .  Vitamin D, Ergocalciferol, (DRISDOL) 50000 units CAPS capsule, Take 1 capsule (50,000 Units total) by mouth every 7 (seven) days., Disp: 12 capsule, Rfl: 4  Review of Systems  Constitutional: Negative.   Respiratory: Negative.   Cardiovascular: Negative.   Gastrointestinal: Negative.   Neurological: Negative.     Social History   Tobacco Use  . Smoking status: Former Smoker    Last attempt to quit: 03/16/2000    Years since quitting: 17.6  . Smokeless tobacco: Never Used  Substance Use Topics  . Alcohol use: No   Objective:   BP 130/80 (BP Location: Left Arm, Patient Position: Sitting, Cuff Size: Normal)   Pulse 61   Temp 98.7 F (37.1 C) (Oral)   Resp 16   Wt 243 lb 12.8 oz (110.6 kg)   SpO2 98%   BMI 41.85 kg/m    Physical Exam  Constitutional: She appears well-developed and well-nourished. No distress.  Neck: Normal range of motion. Neck supple. No JVD present. No tracheal deviation present. No thyromegaly present.  Cardiovascular: Normal rate, regular rhythm and normal heart sounds. Exam reveals no gallop and no friction rub.  No murmur heard. Pulmonary/Chest: Effort normal and breath sounds normal. No respiratory distress. She has no wheezes. She has no rales.  Musculoskeletal: She exhibits no edema.  Lymphadenopathy:    She has no cervical adenopathy.  Skin: She is not diaphoretic.  Vitals reviewed.       Assessment & Plan:     1. Essential hypertension Stable. Continue current medical treatment plan.   2. Type 2 diabetes mellitus without complication, without long-term current use of insulin ([place]) Will check labs as below and f/u pending results. Continue current medical treatment plan. - Hemoglobin A1c  3. Pure hypercholesterolemia Will check labs as below and f/u pending results. Continue current medical treatment plan.  - Lipid panel  4. Adjustment disorder with anxious mood Stable. Diagnosis pulled for medication refill. Continue current medical treatment plan. - ALPRAZolam (XANAX) 1 MG tablet; TAKE ONE TABLET BY MOUTH AT BEDTIME AS NEEDED FOR ANXIETY  Dispense: 90 tablet; Refill: [place], PA-C  [facility]

## 2018-05-07 ENCOUNTER — Encounter: Payer: Self-pay | Admitting: Physician Assistant

## 2018-05-07 ENCOUNTER — Ambulatory Visit (INDEPENDENT_AMBULATORY_CARE_PROVIDER_SITE_OTHER): Payer: BC Managed Care – PPO | Admitting: Physician Assistant

## 2018-05-07 ENCOUNTER — Other Ambulatory Visit: Payer: Self-pay

## 2018-05-07 VITALS — BP 130/84 | HR 59 | Temp 98.4°F | Resp 16 | Ht 64.0 in | Wt 246.0 lb

## 2018-05-07 DIAGNOSIS — M1710 Unilateral primary osteoarthritis, unspecified knee: Secondary | ICD-10-CM

## 2018-05-07 DIAGNOSIS — E119 Type 2 diabetes mellitus without complications: Secondary | ICD-10-CM

## 2018-05-07 DIAGNOSIS — M179 Osteoarthritis of knee, unspecified: Secondary | ICD-10-CM

## 2018-05-07 DIAGNOSIS — F4322 Adjustment disorder with anxiety: Secondary | ICD-10-CM

## 2018-05-07 DIAGNOSIS — Z0000 Encounter for general adult medical examination without abnormal findings: Secondary | ICD-10-CM | POA: Diagnosis not present

## 2018-05-07 DIAGNOSIS — I10 Essential (primary) hypertension: Secondary | ICD-10-CM

## 2018-05-07 DIAGNOSIS — E7800 Pure hypercholesterolemia, unspecified: Secondary | ICD-10-CM | POA: Diagnosis not present

## 2018-05-07 DIAGNOSIS — E559 Vitamin D deficiency, unspecified: Secondary | ICD-10-CM

## 2018-05-07 DIAGNOSIS — Z6841 Body Mass Index (BMI) 40.0 and over, adult: Secondary | ICD-10-CM

## 2018-05-07 DIAGNOSIS — G43109 Migraine with aura, not intractable, without status migrainosus: Secondary | ICD-10-CM

## 2018-05-07 MED ORDER — LISINOPRIL-HYDROCHLOROTHIAZIDE 10-12.5 MG PO TABS: 1.0000 | ORAL_TABLET | Freq: Every day | ORAL | 3 refills | Status: DC

## 2018-05-07 MED ORDER — ALPRAZOLAM 1 MG PO TABS: ORAL_TABLET | ORAL | 1 refills | Status: DC

## 2018-05-07 MED ORDER — LORATADINE 10 MG PO TABS: 10.0000 mg | ORAL_TABLET | Freq: Every day | ORAL | 3 refills | Status: DC | PRN

## 2018-05-07 MED ORDER — BACLOFEN 10 MG PO TABS: 10.0000 mg | ORAL_TABLET | Freq: Every day | ORAL | 3 refills | Status: DC

## 2018-05-07 MED ORDER — AMLODIPINE BESYLATE 5 MG PO TABS: 5.0000 mg | ORAL_TABLET | Freq: Every day | ORAL | 3 refills | Status: DC

## 2018-05-07 MED ORDER — MELOXICAM 15 MG PO TABS: 15.0000 mg | ORAL_TABLET | Freq: Every day | ORAL | 3 refills | Status: DC

## 2018-05-07 MED ORDER — SIMVASTATIN 20 MG PO TABS: 20.0000 mg | ORAL_TABLET | Freq: Every day | ORAL | 3 refills | Status: DC

## 2018-05-07 MED ORDER — METFORMIN HCL 1000 MG PO TABS: 1000.0000 mg | ORAL_TABLET | Freq: Two times a day (BID) | ORAL | 3 refills | Status: DC

## 2018-05-07 MED ORDER — SITAGLIPTIN PHOSPHATE 100 MG PO TABS: 100.0000 mg | ORAL_TABLET | Freq: Every day | ORAL | 3 refills | Status: DC

## 2018-05-07 MED ORDER — BISOPROLOL-HYDROCHLOROTHIAZIDE 10-6.25 MG PO TABS: 1.0000 | ORAL_TABLET | Freq: Two times a day (BID) | ORAL | 3 refills | Status: DC

## 2018-05-07 MED ORDER — VITAMIN D (ERGOCALCIFEROL) 1.25 MG (50000 UNIT) PO CAPS: 50000.0000 [IU] | ORAL_CAPSULE | ORAL | 4 refills | Status: DC

## 2018-05-07 MED ORDER — RIZATRIPTAN BENZOATE 5 MG PO TABS: 5.0000 mg | ORAL_TABLET | ORAL | 5 refills | Status: DC | PRN

## 2018-05-07 NOTE — Progress Notes (Signed)
Patient: Nichole Martinez, Female    DOB: 28-Dec-1950, 67 y.o.   MRN: [id number] Visit Date: 05/07/2018  Today's Provider: [name], PA-C   Chief Complaint  Patient presents with  . Annual Exam   Subjective:    Annual physical exam Nichole Martinez is a 67 y.o. female who presents today for health maintenance and complete physical. She feels well. She reports exercising none. She reports she is sleeping well. 05/06/17 cpe -----------------------------------------------------------------   Review of Systems  Constitutional: Negative.   HENT: Positive for postnasal drip and sinus pressure.   Eyes: Negative.   Respiratory: Negative.   Cardiovascular: Negative.   Gastrointestinal: Negative.   Endocrine: Negative.   Genitourinary: Negative.   Musculoskeletal: Positive for arthralgias.  Skin: Negative.   Allergic/Immunologic: Positive for environmental allergies.  Neurological: Negative.   Hematological: Negative.   Psychiatric/Behavioral: Negative.     Social History      She  reports that she quit smoking about 18 years ago. She has never used smokeless tobacco. She reports that she does not drink alcohol or use drugs.       Social History   Socioeconomic History  . Marital status: Divorced    Spouse name: Not on file  . Number of children: 1  . Years of education: Not on file  . Highest education level: Not on file  Occupational History  . Occupation: Programmer, multimedia: [employer]  . Financial resource strain: Not on file  . Food insecurity:    Worry: Not on file    Inability: Not on file  . Transportation needs:    Medical: Not on file    Non-medical: Not on file  Tobacco Use  . Smoking status: Former Smoker    Last attempt to quit: 03/16/2000    Years since quitting: 18.1  . Smokeless tobacco: Never Used  Substance and Sexual Activity  . Alcohol use: No  . Drug use: No  . Sexual activity: Not on file  Lifestyle  .  Physical activity:    Days per week: Not on file    Minutes per session: Not on file  . Stress: Not on file  Relationships  . Social connections:    Talks on phone: Not on file    Gets together: Not on file    Attends religious service: Not on file    Active member of club or organization: Not on file    Attends meetings of clubs or organizations: Not on file    Relationship status: Not on file  Other Topics Concern  . Not on file  Social History Narrative  . Not on file    No past medical history on file.   Patient Active Problem List   Diagnosis Date Noted  . Migraine 10/04/2015  . Hyperlipemia 04/05/2015  . Allergic rhinitis 01/31/2015  . Arthritis of knee, degenerative 01/31/2015  . Cannot sleep 01/31/2015  . Avitaminosis D 01/31/2015  . Adiposity 02/24/2009  . Diabetes mellitus, type 2 ([place]) 03/07/2008  . Adaptation reaction 02/19/2008  . Fothergill's neuralgia 01/16/2007  . Acid reflux 03/09/2003  . BP (high blood pressure) 01/28/2003    Past Surgical History:  Procedure Laterality Date  . ABDOMINAL HYSTERECTOMY  1978  . LAMINECTOMY  1995    Family History        No family status information on file.        Her She was adopted.  Family history is unknown by patient.      Allergies  Allergen Reactions  . Morphine     Anaphallaxis.     Current Outpatient Medications:  .  ALPRAZolam (XANAX) 1 MG tablet, TAKE ONE TABLET BY MOUTH AT BEDTIME AS NEEDED FOR ANXIETY, Disp: 90 tablet, Rfl: 1 .  amLODipine (NORVASC) 5 MG tablet, Take 1 tablet (5 mg total) by mouth daily., Disp: 90 tablet, Rfl: 3 .  baclofen (LIORESAL) 10 MG tablet, Take 1 tablet (10 mg total) by mouth 2 (two) times daily. (Patient taking differently: Take 10 mg by mouth daily. ), Disp: 180 each, Rfl: 3 .  bisoprolol-hydrochlorothiazide (ZIAC) 10-6.25 MG tablet, Take 1 tablet by mouth 2 (two) times daily., Disp: 180 tablet, Rfl: 3 .  Cholecalciferol (VITAMIN D3) 5000 units CAPS, Take by mouth.,  Disp: , Rfl:  .  cyanocobalamin 1000 MCG tablet, Take by mouth., Disp: , Rfl:  .  lisinopril-hydrochlorothiazide (PRINZIDE,ZESTORETIC) 10-12.5 MG tablet, Take 1 tablet by mouth daily., Disp: 90 tablet, Rfl: 3 .  loratadine (CLARITIN) 10 MG tablet, Take 1 tablet (10 mg total) by mouth daily. (Patient taking differently: Take 10 mg by mouth daily as needed. ), Disp: 90 tablet, Rfl: 3 .  meloxicam (MOBIC) 15 MG tablet, Take 1 tablet (15 mg total) by mouth daily., Disp: 90 tablet, Rfl: 3 .  metFORMIN (GLUCOPHAGE) 1000 MG tablet, Take 1 tablet (1,000 mg total) by mouth 2 (two) times daily with a meal., Disp: 180 tablet, Rfl: 3 .  MULTIPLE VITAMINS PO, MULTIVITAMINS (Oral Tablet)  1 Every Day for 0 days  Quantity: 0.00;  Refills: 0   Ordered :17-Apr-2011  [name] ;  Started 24-February-2009 Active, Disp: , Rfl:  .  OMEGA 3-6-9 FATTY ACIDS PO, Reported on 10/04/2015, Disp: , Rfl:  .  rizatriptan (MAXALT) 5 MG tablet, Take 1 tablet (5 mg total) by mouth as needed for migraine. May repeat in 2 hours if needed, Disp: 10 tablet, Rfl: 5 .  simvastatin (ZOCOR) 20 MG tablet, Take 1 tablet (20 mg total) by mouth at bedtime., Disp: 90 tablet, Rfl: 3 .  sitaGLIPtin (JANUVIA) 100 MG tablet, Take 1 tablet (100 mg total) by mouth daily., Disp: 90 tablet, Rfl: 3 .  Vitamin D, Ergocalciferol, (DRISDOL) 50000 units CAPS capsule, Take 1 capsule (50,000 Units total) by mouth every 7 (seven) days., Disp: 12 capsule, Rfl: 4 .  buPROPion (WELLBUTRIN SR) 150 MG 12 hr tablet, Take 1 tablet (150 mg total) by mouth 2 (two) times daily. (Patient not taking: Reported on 05/07/2018), Disp: 180 tablet, Rfl: 3   Patient Care Team: [name], PA-C as PCP - General (Family Medicine)      Objective:   Vitals: BP 130/84 (BP Location: Left Arm, Patient Position: Sitting, Cuff Size: Large)   Pulse (!) 59   Temp 98.4 F (36.9 C) (Oral)   Resp 16   Ht 5\' 4"  (1.626 m)   Wt 246 lb (111.6 kg)   SpO2 96%   BMI 42.23 kg/m     Vitals:   05/07/18 0902  BP: 130/84  Pulse: (!) 59  Resp: 16  Temp: 98.4 F (36.9 C)  TempSrc: Oral  SpO2: 96%  Weight: 246 lb (111.6 kg)  Height: 5\' 4"  (1.626 m)     Physical Exam  Constitutional: She is oriented to person, place, and time. She appears well-developed and well-nourished. No distress.  HENT:  Head: Normocephalic and atraumatic.  Right Ear: Hearing, tympanic membrane, external ear  and ear canal normal.  Left Ear: Hearing, tympanic membrane, external ear and ear canal normal.  Nose: Nose normal.  Mouth/Throat: Uvula is midline, oropharynx is clear and moist and mucous membranes are normal. No oropharyngeal exudate.  Eyes: Pupils are equal, round, and reactive to light. Conjunctivae and EOM are normal. Right eye exhibits no discharge. Left eye exhibits no discharge. No scleral icterus.  Neck: Normal range of motion. Neck supple. No JVD present. Carotid bruit is not present. No tracheal deviation present. No thyromegaly present.  Cardiovascular: Normal rate, regular rhythm, normal heart sounds and intact distal pulses. Exam reveals no gallop and no friction rub.  No murmur heard. Pulmonary/Chest: Effort normal and breath sounds normal. No respiratory distress. She has no wheezes. She has no rales. She exhibits no tenderness.  Abdominal: Soft. Bowel sounds are normal. She exhibits no distension and no mass. There is no tenderness. There is no rebound and no guarding.  Musculoskeletal: Normal range of motion. She exhibits no edema or tenderness.  Lymphadenopathy:    She has no cervical adenopathy.  Neurological: She is alert and oriented to person, place, and time.  Skin: Skin is warm and dry. No rash noted. She is not diaphoretic.  Psychiatric: She has a normal mood and affect. Her behavior is normal. Judgment and thought content normal.  Vitals reviewed.    Depression Screen PHQ 2/9 Scores 05/06/2017  PHQ - 2 Score 0  PHQ- 9 Score 0      Assessment & Plan:      Routine Health Maintenance and Physical Exam  Exercise Activities and Dietary recommendations Goals   None     Immunization History  Administered Date(s) Administered  . Influenza,inj,Quad PF,6+ Mos 06/17/2015    Health Maintenance  Topic Date Due  . TETANUS/TDAP  09/03/1972  . OPHTHALMOLOGY EXAM  03/07/2017  . HEMOGLOBIN A1C  11/06/2017  . INFLUENZA VACCINE  04/16/2018  . FOOT EXAM  05/06/2018  . MAMMOGRAM  05/07/2018 (Originally 09/04/2003)  . COLONOSCOPY  05/07/2018 (Originally 09/04/2003)  . Hepatitis C Screening  Completed  . HIV Screening  Completed     Discussed health benefits of physical activity, and encouraged her to engage in regular exercise appropriate for her age and condition.    1. Annual physical exam Normal physical exam today. Will check labs as below and f/u pending lab results. If labs are stable and WNL she will not need to have these rechecked for one year at her next annual physical exam. She is to call the office in the meantime if she has any acute issue, questions or concerns.  2. Type 2 diabetes mellitus without complication, without long-term current use of insulin ([facility]) Stable. Diagnosis pulled for medication refill. Continue current medical treatment plan. Will check labs as below and f/u pending results. I will see her back in 6 months for f/u on T2DM and HTN.  - Hemoglobin A1c - TSH - metFORMIN (GLUCOPHAGE) 1000 MG tablet; Take 1 tablet (1,000 mg total) by mouth 2 (two) times daily with a meal.  Dispense: 180 tablet; Refill: 3 - sitaGLIPtin (JANUVIA) 100 MG tablet; Take 1 tablet (100 mg total) by mouth daily.  Dispense: 90 tablet; Refill: 3  3. Essential hypertension Stable. Diagnosis pulled for medication refill. Continue current medical treatment plan. Will check labs as below and f/u pending results. - CBC with Differential/Platelet - Comprehensive metabolic panel - TSH - bisoprolol-hydrochlorothiazide (ZIAC) 10-6.25 MG tablet;  Take 1 tablet by mouth 2 (two) times daily.  Dispense: 180 tablet; Refill: 3 - lisinopril-hydrochlorothiazide (PRINZIDE,ZESTORETIC) 10-12.5 MG tablet; Take 1 tablet by mouth daily.  Dispense: 90 tablet; Refill: 3 - amLODipine (NORVASC) 5 MG tablet; Take 1 tablet (5 mg total) by mouth daily.  Dispense: 90 tablet; Refill: 3  4. Pure hypercholesterolemia Stable. Diagnosis pulled for medication refill. Continue current medical treatment plan. Will check labs as below and f/u pending results. - Lipid Panel With LDL/HDL Ratio - TSH - simvastatin (ZOCOR) 20 MG tablet; Take 1 tablet (20 mg total) by mouth at bedtime.  Dispense: 90 tablet; Refill: 3  5. Avitaminosis D Stable. Diagnosis pulled for medication refill. Continue current medical treatment plan. Will check labs as below and f/u pending results. - VITAMIN D 25 Hydroxy (Vit-D Deficiency, Fractures) - Vitamin D, Ergocalciferol, (DRISDOL) 50000 units CAPS capsule; Take 1 capsule (50,000 Units total) by mouth every 7 (seven) days.  Dispense: 12 capsule; Refill: 4  6. Osteoarthritis of knee, unspecified laterality, unspecified osteoarthritis type Stable. Diagnosis pulled for medication refill. Continue current medical treatment plan. Will check labs as below and f/u pending results. Also followed by Dr. Noemi Chapel.  - baclofen (LIORESAL) 10 MG tablet; Take 1 tablet (10 mg total) by mouth daily.  Dispense: 90 tablet; Refill: 3 - meloxicam (MOBIC) 15 MG tablet; Take 1 tablet (15 mg total) by mouth daily.  Dispense: 90 tablet; Refill: 3  7. Adjustment disorder with anxious mood Stable. Diagnosis pulled for medication refill. Continue current medical treatment plan. - ALPRAZolam (XANAX) 1 MG tablet; TAKE ONE TABLET BY MOUTH AT BEDTIME AS NEEDED FOR ANXIETY  Dispense: 90 tablet; Refill: 1  8. Migraine with aura and without status migrainosus, not intractable Stable. Diagnosis pulled for medication refill. Continue current medical treatment plan. -  rizatriptan (MAXALT) 5 MG tablet; Take 1 tablet (5 mg total) by mouth as needed for migraine. May repeat in 2 hours if needed  Dispense: 10 tablet; Refill: 5  9. BMI 40.0-44.9, adult ([place]) Counseled patient on healthy lifestyle modifications including dieting and exercise.   --------------------------------------------------------------------    [name], PA-C  [facility]

## 2018-05-07 NOTE — Patient Instructions (Signed)
Health Maintenance for Postmenopausal Women Menopause is a normal process in which your reproductive ability comes to an end. This process happens gradually over a span of months to years, usually between the ages of 22 and 9. Menopause is complete when you have missed 12 consecutive menstrual periods. It is important to talk with your health care provider about some of the most common conditions that affect postmenopausal women, such as heart disease, cancer, and bone loss (osteoporosis). Adopting a healthy lifestyle and getting preventive care can help to promote your health and wellness. Those actions can also lower your chances of developing some of these common conditions. What should I know about menopause? During menopause, you may experience a number of symptoms, such as:  Moderate-to-severe hot flashes.  Night sweats.  Decrease in sex drive.  Mood swings.  Headaches.  Tiredness.  Irritability.  Memory problems.  Insomnia.  Choosing to treat or not to treat menopausal changes is an individual decision that you make with your health care provider. What should I know about hormone replacement therapy and supplements? Hormone therapy products are effective for treating symptoms that are associated with menopause, such as hot flashes and night sweats. Hormone replacement carries certain risks, especially as you become older. If you are thinking about using estrogen or estrogen with progestin treatments, discuss the benefits and risks with your health care provider. What should I know about heart disease and stroke? Heart disease, heart attack, and stroke become more likely as you age. This may be due, in part, to the hormonal changes that your body experiences during menopause. These can affect how your body processes dietary fats, triglycerides, and cholesterol. Heart attack and stroke are both medical emergencies. There are many things that you can do to help prevent heart disease  and stroke:  Have your blood pressure checked at least every 1-2 years. High blood pressure causes heart disease and increases the risk of stroke.  If you are 53-22 years old, ask your health care provider if you should take aspirin to prevent a heart attack or a stroke.  Do not use any tobacco products, including cigarettes, chewing tobacco, or electronic cigarettes. If you need help quitting, ask your health care provider.  It is important to eat a healthy diet and maintain a healthy weight. ? Be sure to include plenty of vegetables, fruits, low-fat dairy products, and lean protein. ? Avoid eating foods that are high in solid fats, added sugars, or salt (sodium).  Get regular exercise. This is one of the most important things that you can do for your health. ? Try to exercise for at least 150 minutes each week. The type of exercise that you do should increase your heart rate and make you sweat. This is known as moderate-intensity exercise. ? Try to do strengthening exercises at least twice each week. Do these in addition to the moderate-intensity exercise.  Know your numbers.Ask your health care provider to check your cholesterol and your blood glucose. Continue to have your blood tested as directed by your health care provider.  What should I know about cancer screening? There are several types of cancer. Take the following steps to reduce your risk and to catch any cancer development as early as possible. Breast Cancer  Practice breast self-awareness. ? This means understanding how your breasts normally appear and feel. ? It also means doing regular breast self-exams. Let your health care provider know about any changes, no matter how small.  If you are 40  or older, have a clinician do a breast exam (clinical breast exam or CBE) every year. Depending on your age, family history, and medical history, it may be recommended that you also have a yearly breast X-ray (mammogram).  If you  have a family history of breast cancer, talk with your health care provider about genetic screening.  If you are at high risk for breast cancer, talk with your health care provider about having an MRI and a mammogram every year.  Breast cancer (BRCA) gene test is recommended for women who have family members with BRCA-related cancers. Results of the assessment will determine the need for genetic counseling and BRCA1 and for BRCA2 testing. BRCA-related cancers include these types: ? Breast. This occurs in males or females. ? Ovarian. ? Tubal. This may also be called fallopian tube cancer. ? Cancer of the abdominal or pelvic lining (peritoneal cancer). ? Prostate. ? Pancreatic.  Cervical, Uterine, and Ovarian Cancer Your health care provider may recommend that you be screened regularly for cancer of the pelvic organs. These include your ovaries, uterus, and vagina. This screening involves a pelvic exam, which includes checking for microscopic changes to the surface of your cervix (Pap test).  For women ages 21-65, health care providers may recommend a pelvic exam and a Pap test every three years. For women ages 79-65, they may recommend the Pap test and pelvic exam, combined with testing for human papilloma virus (HPV), every five years. Some types of HPV increase your risk of cervical cancer. Testing for HPV may also be done on women of any age who have unclear Pap test results.  Other health care providers may not recommend any screening for nonpregnant women who are considered low risk for pelvic cancer and have no symptoms. Ask your health care provider if a screening pelvic exam is right for you.  If you have had past treatment for cervical cancer or a condition that could lead to cancer, you need Pap tests and screening for cancer for at least 20 years after your treatment. If Pap tests have been discontinued for you, your risk factors (such as having a new sexual partner) need to be  reassessed to determine if you should start having screenings again. Some women have medical problems that increase the chance of getting cervical cancer. In these cases, your health care provider may recommend that you have screening and Pap tests more often.  If you have a family history of uterine cancer or ovarian cancer, talk with your health care provider about genetic screening.  If you have vaginal bleeding after reaching menopause, tell your health care provider.  There are currently no reliable tests available to screen for ovarian cancer.  Lung Cancer Lung cancer screening is recommended for adults 69-62 years old who are at high risk for lung cancer because of a history of smoking. A yearly low-dose CT scan of the lungs is recommended if you:  Currently smoke.  Have a history of at least 30 pack-years of smoking and you currently smoke or have quit within the past 15 years. A pack-year is smoking an average of one pack of cigarettes per day for one year.  Yearly screening should:  Continue until it has been 15 years since you quit.  Stop if you develop a health problem that would prevent you from having lung cancer treatment.  Colorectal Cancer  This type of cancer can be detected and can often be prevented.  Routine colorectal cancer screening usually begins at  age 42 and continues through age 45.  If you have risk factors for colon cancer, your health care provider may recommend that you be screened at an earlier age.  If you have a family history of colorectal cancer, talk with your health care provider about genetic screening.  Your health care provider may also recommend using home test kits to check for hidden blood in your stool.  A small camera at the end of a tube can be used to examine your colon directly (sigmoidoscopy or colonoscopy). This is done to check for the earliest forms of colorectal cancer.  Direct examination of the colon should be repeated every  5-10 years until age 71. However, if early forms of precancerous polyps or small growths are found or if you have a family history or genetic risk for colorectal cancer, you may need to be screened more often.  Skin Cancer  Check your skin from head to toe regularly.  Monitor any moles. Be sure to tell your health care provider: ? About any new moles or changes in moles, especially if there is a change in a mole's shape or color. ? If you have a mole that is larger than the size of a pencil eraser.  If any of your family members has a history of skin cancer, especially at a young age, talk with your health care provider about genetic screening.  Always use sunscreen. Apply sunscreen liberally and repeatedly throughout the day.  Whenever you are outside, protect yourself by wearing long sleeves, pants, a wide-brimmed hat, and sunglasses.  What should I know about osteoporosis? Osteoporosis is a condition in which bone destruction happens more quickly than new bone creation. After menopause, you may be at an increased risk for osteoporosis. To help prevent osteoporosis or the bone fractures that can happen because of osteoporosis, the following is recommended:  If you are 46-71 years old, get at least 1,000 mg of calcium and at least 600 mg of vitamin D per day.  If you are older than age 55 but younger than age 65, get at least 1,200 mg of calcium and at least 600 mg of vitamin D per day.  If you are older than age 54, get at least 1,200 mg of calcium and at least 800 mg of vitamin D per day.  Smoking and excessive alcohol intake increase the risk of osteoporosis. Eat foods that are rich in calcium and vitamin D, and do weight-bearing exercises several times each week as directed by your health care provider. What should I know about how menopause affects my mental health? Depression may occur at any age, but it is more common as you become older. Common symptoms of depression  include:  Low or sad mood.  Changes in sleep patterns.  Changes in appetite or eating patterns.  Feeling an overall lack of motivation or enjoyment of activities that you previously enjoyed.  Frequent crying spells.  Talk with your health care provider if you think that you are experiencing depression. What should I know about immunizations? It is important that you get and maintain your immunizations. These include:  Tetanus, diphtheria, and pertussis (Tdap) booster vaccine.  Influenza every year before the flu season begins.  Pneumonia vaccine.  Shingles vaccine.  Your health care provider may also recommend other immunizations. This information is not intended to replace advice given to you by your health care provider. Make sure you discuss any questions you have with your health care provider. Document Released: 10/25/2005  Document Revised: 03/22/2016 Document Reviewed: 06/06/2015 Elsevier Interactive Patient Education  [employer].

## 2018-05-08 ENCOUNTER — Telehealth: Payer: Self-pay

## 2018-05-08 LAB — COMPREHENSIVE METABOLIC PANEL
ALBUMIN: 4.4 g/dL (ref 3.6–4.8)
ALK PHOS: 71 IU/L (ref 39–117)
ALT: 16 IU/L (ref 0–32)
AST: 17 IU/L (ref 0–40)
Albumin/Globulin Ratio: 1.7 (ref 1.2–2.2)
BUN / CREAT RATIO: 28 (ref 12–28)
BUN: 21 mg/dL (ref 8–27)
Bilirubin Total: 0.5 mg/dL (ref 0.0–1.2)
CALCIUM: 9.8 mg/dL (ref 8.7–10.3)
CO2: 23 mmol/L (ref 20–29)
CREATININE: 0.76 mg/dL (ref 0.57–1.00)
Chloride: 102 mmol/L (ref 96–106)
GFR calc Af Amer: 96 mL/min/{1.73_m2} (ref >59)
GFR, EST NON AFRICAN AMERICAN: 83 mL/min/{1.73_m2} (ref >59)
GLOBULIN, TOTAL: 2.6 g/dL (ref 1.5–4.5)
GLUCOSE: 132 mg/dL — AB (ref 65–99)
Potassium: 4.3 mmol/L (ref 3.5–5.2)
SODIUM: 141 mmol/L (ref 134–144)
TOTAL PROTEIN: 7 g/dL (ref 6.0–8.5)

## 2018-05-08 LAB — CBC WITH DIFFERENTIAL/PLATELET
BASOS ABS: 0 10*3/uL (ref 0.0–0.2)
Basos: 0 %
EOS (ABSOLUTE): 1.4 10*3/uL — ABNORMAL HIGH (ref 0.0–0.4)
EOS: 18 %
HEMATOCRIT: 42.8 % (ref 34.0–46.6)
HEMOGLOBIN: 13.8 g/dL (ref 11.1–15.9)
IMMATURE GRANS (ABS): 0 10*3/uL (ref 0.0–0.1)
IMMATURE GRANULOCYTES: 0 %
LYMPHS ABS: 2 10*3/uL (ref 0.7–3.1)
LYMPHS: 26 %
MCH: 26.3 pg — ABNORMAL LOW (ref 26.6–33.0)
MCHC: 32.2 g/dL (ref 31.5–35.7)
MCV: 82 fL (ref 79–97)
MONOCYTES: 6 %
Monocytes Absolute: 0.4 10*3/uL (ref 0.1–0.9)
Neutrophils Absolute: 3.9 10*3/uL (ref 1.4–7.0)
Neutrophils: 50 %
Platelets: 252 10*3/uL (ref 150–450)
RBC: 5.24 x10E6/uL (ref 3.77–5.28)
RDW: 14.6 % (ref 12.3–15.4)
WBC: 7.7 10*3/uL (ref 3.4–10.8)

## 2018-05-08 LAB — LIPID PANEL WITH LDL/HDL RATIO
Cholesterol, Total: 193 mg/dL (ref 100–199)
HDL: 60 mg/dL (ref >39)
LDL Calculated: 101 mg/dL — ABNORMAL HIGH (ref 0–99)
LDL/HDL RATIO: 1.7 ratio (ref 0.0–3.2)
TRIGLYCERIDES: 161 mg/dL — AB (ref 0–149)
VLDL CHOLESTEROL CAL: 32 mg/dL (ref 5–40)

## 2018-05-08 LAB — HEMOGLOBIN A1C
Est. average glucose Bld gHb Est-mCnc: 140 mg/dL
HEMOGLOBIN A1C: 6.5 % — AB (ref 4.8–5.6)

## 2018-05-08 LAB — VITAMIN D 25 HYDROXY (VIT D DEFICIENCY, FRACTURES): Vit D, 25-Hydroxy: 45.4 ng/mL (ref 30.0–100.0)

## 2018-05-08 LAB — TSH: TSH: 0.782 u[IU]/mL (ref 0.450–4.500)

## 2018-05-08 NOTE — Telephone Encounter (Signed)
-----   Message from [name], [place] sent at 05/08/2018  1:53 PM EDT ----- Blood count is normal. Kidney and liver function normal. A1c up slightly to 6.5 from 6.4. Cholesterol stable. Thyroid normal. Vit D normal.

## 2018-05-08 NOTE — Telephone Encounter (Signed)
Patient advised as directed below.  Thanks,  -Taleigh Gero 

## 2018-07-01 ENCOUNTER — Ambulatory Visit: Payer: BC Managed Care – PPO | Admitting: Physician Assistant

## 2018-09-16 DIAGNOSIS — C50911 Malignant neoplasm of unspecified site of right female breast: Secondary | ICD-10-CM

## 2018-09-16 HISTORY — DX: Malignant neoplasm of unspecified site of right female breast: C50.911

## 2018-11-09 ENCOUNTER — Ambulatory Visit: Payer: BC Managed Care – PPO | Admitting: Physician Assistant

## 2018-11-25 DIAGNOSIS — Z6838 Body mass index (BMI) 38.0-38.9, adult: Secondary | ICD-10-CM | POA: Diagnosis not present

## 2018-11-25 DIAGNOSIS — M170 Bilateral primary osteoarthritis of knee: Secondary | ICD-10-CM | POA: Diagnosis not present

## 2018-12-08 ENCOUNTER — Encounter: Payer: Self-pay | Admitting: Podiatry

## 2018-12-08 ENCOUNTER — Ambulatory Visit (INDEPENDENT_AMBULATORY_CARE_PROVIDER_SITE_OTHER): Payer: Medicare Other | Admitting: Podiatry

## 2018-12-08 ENCOUNTER — Other Ambulatory Visit: Payer: Self-pay

## 2018-12-08 VITALS — BP 180/85 | HR 69

## 2018-12-08 DIAGNOSIS — L600 Ingrowing nail: Secondary | ICD-10-CM | POA: Diagnosis not present

## 2018-12-08 DIAGNOSIS — B351 Tinea unguium: Secondary | ICD-10-CM | POA: Diagnosis not present

## 2018-12-08 MED ORDER — GENTAMICIN SULFATE 0.1 % EX CREA: 1.0000 "application " | TOPICAL_CREAM | Freq: Two times a day (BID) | CUTANEOUS | 1 refills | Status: DC

## 2018-12-08 MED ORDER — TERBINAFINE HCL 250 MG PO TABS: 250.0000 mg | ORAL_TABLET | Freq: Every day | ORAL | 0 refills | Status: DC

## 2018-12-08 NOTE — Patient Instructions (Signed)

## 2018-12-09 NOTE — Progress Notes (Signed)
   Subjective: Patient presents today for evaluation of intermittent pain to the lateral border of the left hallux that began about one year ago. Patient is concerned for possible ingrown nail. She also notes discoloration and thickening of all the nails of both feet and is concerned for fungus. Wearing shoes increases the toe pain. She has not done anything for treatment. Patient presents today for further treatment and evaluation.  History reviewed. No pertinent past medical history.  Objective:  General: Well developed, nourished, in no acute distress, alert and oriented x3   Dermatology: Skin is warm, dry and supple bilateral. Lateral border of the left hallux appears to be erythematous with evidence of an ingrowing nail. Pain on palpation noted to the border of the nail fold. Hyperkeratotic, discolored, thickened, onychodystrophy of nails 1-5 bilaterally. The remaining nails appear unremarkable at this time. There are no open sores, lesions.  Vascular: Dorsalis Pedis artery and Posterior Tibial artery pedal pulses palpable. No lower extremity edema noted.   Neruologic: Grossly intact via light touch bilateral.  Musculoskeletal: Muscular strength within normal limits in all groups bilateral. Normal range of motion noted to all pedal and ankle joints.   Assesement: #1 Paronychia with ingrowing nail lateral border left hallux  #2 Incurvated nail #3 Onychomycosis nails 1-5 bilateral   Plan of Care:  1. Patient evaluated.  2. Discussed treatment alternatives and plan of care. Explained nail avulsion procedure and post procedure course to patient. 3. Patient opted for temporary partial nail avulsion.  4. Prior to procedure, local anesthesia infiltration utilized using 3 ml of a 50:50 mixture of 2% plain lidocaine and 0.5% plain marcaine in a normal hallux block fashion and a betadine prep performed.  5. Light dressing applied. 6. Prescription for Lamisil 250 mg #90 provided to patient.   7. Prescription for Gentamicin cream provided to patient to use daily with a bandage.  8. Return to clinic as needed.   Edrick Kins, DPM [facility]  Dr. Edrick Kins, [address]                                        [place], [place] [address]                Office [phone]  Fax [phone]

## 2018-12-28 ENCOUNTER — Other Ambulatory Visit: Payer: Self-pay

## 2018-12-28 ENCOUNTER — Ambulatory Visit (INDEPENDENT_AMBULATORY_CARE_PROVIDER_SITE_OTHER): Payer: Medicare Other | Admitting: Physician Assistant

## 2018-12-28 ENCOUNTER — Encounter: Payer: Self-pay | Admitting: Physician Assistant

## 2018-12-28 VITALS — BP 142/88 | HR 63 | Temp 97.8°F | Resp 16 | Wt 235.4 lb

## 2018-12-28 DIAGNOSIS — N63 Unspecified lump in unspecified breast: Secondary | ICD-10-CM | POA: Diagnosis not present

## 2018-12-28 NOTE — Progress Notes (Addendum)
Patient: Nichole Martinez Female    DOB: September 24, 1950   68 y.o.   MRN: [id number] Visit Date: 12/28/2018  Today's Provider: [name], PA-C   No chief complaint on file.  Subjective:     HPI  Right Breast Lump: Patient presents for evaluation of a breast lump that has been there for years. Change was noted 2 months ago. Patient does routinely do self breast exams.  Patient has noted a change on breast exam. Breast cancer risk factors include none.  Patient denies nipple discharge. Patient admits to to previous breast biopsy. Patient denies a personal history of breast cancer.She reports that her nipple appearance is different and change on her nipple was noted 3 weeks ago. She has not had a mammogram since the 1998-1999. She reports she was always doing  Self breast exam.   Allergies  Allergen Reactions  . Morphine     Anaphallaxis.     Current Outpatient Medications:  .  ALPRAZolam (XANAX) 1 MG tablet, TAKE ONE TABLET BY MOUTH AT BEDTIME AS NEEDED FOR ANXIETY, Disp: 90 tablet, Rfl: 1 .  amLODipine (NORVASC) 5 MG tablet, Take 1 tablet (5 mg total) by mouth daily., Disp: 90 tablet, Rfl: 3 .  baclofen (LIORESAL) 10 MG tablet, Take 1 tablet (10 mg total) by mouth daily., Disp: 90 tablet, Rfl: 3 .  bisoprolol-hydrochlorothiazide (ZIAC) 10-6.25 MG tablet, Take 1 tablet by mouth 2 (two) times daily., Disp: 180 tablet, Rfl: 3 .  Cholecalciferol (VITAMIN D3) 5000 units CAPS, Take by mouth., Disp: , Rfl:  .  cyanocobalamin 1000 MCG tablet, Take by mouth., Disp: , Rfl:  .  gentamicin cream (GARAMYCIN) 0.1 %, Apply 1 application topically 2 (two) times daily., Disp: 15 g, Rfl: 1 .  lisinopril-hydrochlorothiazide (PRINZIDE,ZESTORETIC) 10-12.5 MG tablet, Take 1 tablet by mouth daily., Disp: 90 tablet, Rfl: 3 .  loratadine (CLARITIN) 10 MG tablet, Take 1 tablet (10 mg total) by mouth daily as needed., Disp: 90 tablet, Rfl: 3 .  meloxicam (MOBIC) 15 MG tablet, Take 1 tablet (15 mg  total) by mouth daily., Disp: 90 tablet, Rfl: 3 .  metFORMIN (GLUCOPHAGE) 1000 MG tablet, Take 1 tablet (1,000 mg total) by mouth 2 (two) times daily with a meal., Disp: 180 tablet, Rfl: 3 .  MULTIPLE VITAMINS PO, MULTIVITAMINS (Oral Tablet)  1 Every Day for 0 days  Quantity: 0.00;  Refills: 0   Ordered :17-Apr-2011  [name] ;  Started 24-February-2009 Active, Disp: , Rfl:  .  OMEGA 3-6-9 FATTY ACIDS PO, Reported on 10/04/2015, Disp: , Rfl:  .  rizatriptan (MAXALT) 5 MG tablet, Take 1 tablet (5 mg total) by mouth as needed for migraine. May repeat in 2 hours if needed, Disp: 10 tablet, Rfl: 5 .  simvastatin (ZOCOR) 20 MG tablet, Take 1 tablet (20 mg total) by mouth at bedtime., Disp: 90 tablet, Rfl: 3 .  sitaGLIPtin (JANUVIA) 100 MG tablet, Take 1 tablet (100 mg total) by mouth daily., Disp: 90 tablet, Rfl: 3 .  terbinafine (LAMISIL) 250 MG tablet, Take 1 tablet (250 mg total) by mouth daily., Disp: 90 tablet, Rfl: 0 .  Vitamin D, Ergocalciferol, (DRISDOL) 50000 units CAPS capsule, Take 1 capsule (50,000 Units total) by mouth every 7 (seven) days., Disp: 12 capsule, Rfl: 4  Review of Systems  Constitutional: Negative.   Respiratory: Negative.   Cardiovascular: Negative.   Gastrointestinal: Negative.   Neurological: Negative.     Social History   Tobacco  Use  . Smoking status: Former Smoker    Last attempt to quit: 03/16/2000    Years since quitting: 18.7  . Smokeless tobacco: Never Used  Substance Use Topics  . Alcohol use: No      Objective:   BP (!) 142/88 (BP Location: Left Arm, Patient Position: Sitting, Cuff Size: Large)   Pulse 63   Temp 97.8 F (36.6 C) (Oral)   Resp 16   Wt 235 lb 6.4 oz (106.8 kg)   BMI 40.41 kg/m  Vitals:   12/28/18 0858  BP: (!) 142/88  Pulse: 63  Resp: 16  Temp: 97.8 F (36.6 C)  TempSrc: Oral  Weight: 235 lb 6.4 oz (106.8 kg)     Physical Exam Vitals signs reviewed.  Constitutional:      General: She is not in acute distress.     Appearance: Normal appearance. She is well-developed. She is obese. She is not ill-appearing or diaphoretic.  Neck:     Musculoskeletal: Normal range of motion and neck supple.     Thyroid: No thyromegaly.     Vascular: No JVD.     Trachea: No tracheal deviation.  Cardiovascular:     Rate and Rhythm: Normal rate and regular rhythm.     Heart sounds: Normal heart sounds. No murmur. No friction rub. No gallop.   Pulmonary:     Effort: Pulmonary effort is normal. No respiratory distress.     Breath sounds: Normal breath sounds. No wheezing or rales.  Chest:    Lymphadenopathy:     Cervical: No cervical adenopathy.  Neurological:     Mental Status: She is alert.         Assessment & Plan    1. Breast mass in female Has had previous lumpectomy in the site in 1986. Her last mammogram was 1998-99. She reports she always did self exams. She forgot to do self exams over the last 3-4 months. Did exam in February and felt the mass had enlarged. Then 3 weeks ago noticed her nipple starting to invert. Mammogram and [place] ordered as below. I will f/u pending results.  - MS DIGITAL DIAG TOMO BILAT; Future - US BREAST COMPLETE UNI RIGHT INC AXILLA; Future     [name], PA-C  [facility]

## 2018-12-29 ENCOUNTER — Telehealth: Payer: Self-pay | Admitting: Physician Assistant

## 2018-12-29 NOTE — Addendum Note (Signed)
Addended by: [name] on: 12/29/2018 03:51 PM   Modules accepted: Orders

## 2018-12-29 NOTE — Telephone Encounter (Signed)
Can you change right breast ultrasound to limited instead of complete and diagnostic mammogram must be TOMO, Thanks.

## 2018-12-29 NOTE — Telephone Encounter (Signed)
Changed. I had ordered tomo and [place] was changed to limited

## 2018-12-30 ENCOUNTER — Other Ambulatory Visit: Payer: Self-pay

## 2018-12-30 ENCOUNTER — Other Ambulatory Visit: Payer: Self-pay | Admitting: Physician Assistant

## 2018-12-30 ENCOUNTER — Ambulatory Visit
Admission: RE | Admit: 2018-12-30 | Discharge: 2018-12-30 | Disposition: A | Discharge status: Home / Self Care | Payer: Medicare Other | Source: Ambulatory Visit | Attending: Physician Assistant | Admitting: Physician Assistant

## 2018-12-30 DIAGNOSIS — N6312 Unspecified lump in the right breast, upper inner quadrant: Secondary | ICD-10-CM | POA: Insufficient documentation

## 2018-12-30 DIAGNOSIS — N63 Unspecified lump in unspecified breast: Secondary | ICD-10-CM

## 2018-12-31 ENCOUNTER — Other Ambulatory Visit: Payer: Self-pay | Admitting: Physician Assistant

## 2018-12-31 DIAGNOSIS — R928 Other abnormal and inconclusive findings on diagnostic imaging of breast: Secondary | ICD-10-CM

## 2018-12-31 DIAGNOSIS — N6310 Unspecified lump in the right breast, unspecified quadrant: Secondary | ICD-10-CM

## 2019-01-04 ENCOUNTER — Other Ambulatory Visit: Payer: BC Managed Care – PPO

## 2019-01-04 ENCOUNTER — Ambulatory Visit: Payer: BC Managed Care – PPO

## 2019-01-06 ENCOUNTER — Other Ambulatory Visit: Payer: Self-pay

## 2019-01-06 ENCOUNTER — Ambulatory Visit
Admission: RE | Admit: 2019-01-06 | Discharge: 2019-01-06 | Disposition: A | Discharge status: Home / Self Care | Payer: Medicare Other | Source: Ambulatory Visit | Attending: Physician Assistant | Admitting: Physician Assistant

## 2019-01-06 DIAGNOSIS — N6310 Unspecified lump in the right breast, unspecified quadrant: Secondary | ICD-10-CM | POA: Diagnosis not present

## 2019-01-06 DIAGNOSIS — C50211 Malignant neoplasm of upper-inner quadrant of right female breast: Secondary | ICD-10-CM | POA: Diagnosis not present

## 2019-01-06 DIAGNOSIS — N6312 Unspecified lump in the right breast, upper inner quadrant: Secondary | ICD-10-CM | POA: Diagnosis not present

## 2019-01-06 DIAGNOSIS — R590 Localized enlarged lymph nodes: Secondary | ICD-10-CM | POA: Diagnosis not present

## 2019-01-06 DIAGNOSIS — R928 Other abnormal and inconclusive findings on diagnostic imaging of breast: Secondary | ICD-10-CM

## 2019-01-06 DIAGNOSIS — C773 Secondary and unspecified malignant neoplasm of axilla and upper limb lymph nodes: Secondary | ICD-10-CM | POA: Diagnosis not present

## 2019-01-06 HISTORY — PX: BREAST BIOPSY: SHX20

## 2019-01-08 ENCOUNTER — Encounter: Payer: Self-pay | Admitting: *Deleted

## 2019-01-08 DIAGNOSIS — C50911 Malignant neoplasm of unspecified site of right female breast: Secondary | ICD-10-CM

## 2019-01-08 NOTE — Progress Notes (Signed)
  Oncology Nurse Navigator Documentation  Navigator Location: [facility] (01/08/19 1200)   )Navigator Encounter Type: Introductory phone call (01/08/19 1200)   Abnormal Finding Date: 12/30/18 (01/08/19 1200) Confirmed Diagnosis Date: 01/07/19 (01/08/19 1200)                   Barriers/Navigation Needs: Education;Coordination of Care (01/08/19 1200) Education: Newly Diagnosed Cancer Education (01/08/19 1200) Interventions: Coordination of Care (01/08/19 1200)   Coordination of Care: Appts (01/08/19 1200)                  Time Spent with Patient: 30 (01/08/19 1200)   Called patient today to introduce to navigation services .  She is newly diagnosed with invasive mammary carcinoma with a positive lymph node.  She is a retired Warden/ranger.  States she would like to see Dr. Bary Castilla.  I have scheduled her an appointment for 01/12/19 @ 3:00.  She is to take a photo ID, all meds and to arrive 15 minutes early.  I have put in a referral for medical oncology consult also.  Patient will pick up her educational material the day of her visit.  She is to call if she has any questions or needs.

## 2019-01-11 ENCOUNTER — Encounter: Payer: Self-pay | Admitting: *Deleted

## 2019-01-11 ENCOUNTER — Other Ambulatory Visit: Payer: Self-pay | Admitting: Pathology

## 2019-01-11 LAB — SURGICAL PATHOLOGY

## 2019-01-11 NOTE — Progress Notes (Signed)
Patient returned my call.  She is ok with the appointment on Wednesday at 11:15.  She is very nervous about coming in by herself.  Explained again reason for no visitors and she understands. Informed her I would not be there on Wednesday, but I would leave educational material for her to pick either Tuesday or Wednesday.  She is agreeable.

## 2019-01-11 NOTE — Progress Notes (Signed)
Left patient a message with her medical oncology appointment with Dr. Grayland Ormond on Wednesday April 29th at 11:15

## 2019-01-12 ENCOUNTER — Other Ambulatory Visit: Payer: Self-pay

## 2019-01-12 ENCOUNTER — Encounter: Payer: Self-pay | Admitting: General Surgery

## 2019-01-12 ENCOUNTER — Ambulatory Visit (INDEPENDENT_AMBULATORY_CARE_PROVIDER_SITE_OTHER): Payer: Medicare Other | Admitting: General Surgery

## 2019-01-12 VITALS — BP 127/79 | HR 63 | Temp 97.5°F | Resp 14 | Ht 64.0 in | Wt 235.0 lb

## 2019-01-12 DIAGNOSIS — Z170 Estrogen receptor positive status [ER+]: Secondary | ICD-10-CM | POA: Diagnosis not present

## 2019-01-12 DIAGNOSIS — C50311 Malignant neoplasm of lower-inner quadrant of right female breast: Secondary | ICD-10-CM | POA: Diagnosis not present

## 2019-01-12 NOTE — Patient Instructions (Signed)
The patient is aware to call back for any questions or new concerns.  

## 2019-01-12 NOTE — Progress Notes (Signed)
Patient ID: Nichole Martinez, female   DOB: 17-May-1951, 68 y.o.   MRN: [id number]  Chief Complaint  Patient presents with  . New Patient (Initial Visit)    breast cancer    HPI Nichole Martinez is a 68 y.o. female who presents for a breast evaluation. The most recent mammogram was done on 12/30/18. She had a biopsy of the right breast on 01/06/19 which showed invasive mammary carcinoma.  Patient does perform regular self breast checks and has not gotten regular mammograms done, her last one prior to this was in 2008.   She reports that she has a lot of scar tissue in her right breast. At the end of February she noticed that her right nipple was drawing in and for the past month it seems to be "dropping".   HPI  Past Medical History:  Diagnosis Date  . Allergy   . Diabetes mellitus without complication ([place]) 8295  . Hyperlipidemia   . Hypertension   . Osteoporosis   . Trigeminal neuralgia     Past Surgical History:  Procedure Laterality Date  . ABDOMINAL HYSTERECTOMY  1978  . BREAST BIOPSY Right 01/06/2019   [place] bx x 2.  Ribbon marker for mass and hydro for Lymph node. Path pending  . BREAST CYST EXCISION Right 1986  . BREAST EXCISIONAL BIOPSY    . LAMINECTOMY  1995   L1, L2, L3  . SPINE SURGERY      Family History  Adopted: Yes  Family history unknown: Yes    Social History Social History   Tobacco Use  . Smoking status: Former Smoker    Last attempt to quit: 03/16/2000    Years since quitting: 18.8  . Smokeless tobacco: Never Used  Substance Use Topics  . Alcohol use: No  . Drug use: No    Allergies  Allergen Reactions  . Morphine     Anaphallaxis.    Current Outpatient Medications  Medication Sig Dispense Refill  . ALPRAZolam (XANAX) 1 MG tablet TAKE ONE TABLET BY MOUTH AT BEDTIME AS NEEDED FOR ANXIETY (Patient not taking: Reported on 01/13/2019) 90 tablet 1  . amLODipine (NORVASC) 5 MG tablet Take 1 tablet (5 mg total) by mouth daily. 90 tablet 3  .  baclofen (LIORESAL) 10 MG tablet Take 1 tablet (10 mg total) by mouth daily. 90 tablet 3  . bisoprolol-hydrochlorothiazide (ZIAC) 10-6.25 MG tablet Take 1 tablet by mouth 2 (two) times daily. 180 tablet 3  . Cholecalciferol (VITAMIN D3) 5000 units CAPS Take by mouth.    Marland Kitchen lisinopril-hydrochlorothiazide (PRINZIDE,ZESTORETIC) 10-12.5 MG tablet Take 1 tablet by mouth daily. 90 tablet 3  . loratadine (CLARITIN) 10 MG tablet Take 1 tablet (10 mg total) by mouth daily as needed. 90 tablet 3  . meloxicam (MOBIC) 15 MG tablet Take 1 tablet (15 mg total) by mouth daily. 90 tablet 3  . metFORMIN (GLUCOPHAGE) 1000 MG tablet Take 1 tablet (1,000 mg total) by mouth 2 (two) times daily with a meal. 180 tablet 3  . MULTIPLE VITAMINS PO MULTIVITAMINS (Oral Tablet)  1 Every Day for 0 days  Quantity: 0.00;  Refills: 0   Ordered :17-Apr-2011  [name] ;  Started 24-February-2009 Active    . OMEGA 3-6-9 FATTY ACIDS PO Reported on 10/04/2015    . simvastatin (ZOCOR) 20 MG tablet Take 1 tablet (20 mg total) by mouth at bedtime. 90 tablet 3  . sitaGLIPtin (JANUVIA) 100 MG tablet Take 1 tablet (100 mg total) by mouth  daily. 90 tablet 3  . terbinafine (LAMISIL) 250 MG tablet Take 1 tablet (250 mg total) by mouth daily. 90 tablet 0  . Vitamin D, Ergocalciferol, (DRISDOL) 50000 units CAPS capsule Take 1 capsule (50,000 Units total) by mouth every 7 (seven) days. 12 capsule 4  . letrozole (FEMARA) 2.5 MG tablet Take 1 tablet (2.5 mg total) by mouth daily. 90 tablet 3  . vitamin E 1000 UNIT capsule Take 1,000 Units by mouth daily.     No current facility-administered medications for this visit.     Review of Systems Review of Systems  Constitutional: Negative.   HENT: Negative.   Eyes: Negative.   Respiratory: Negative.   Cardiovascular: Negative.   Gastrointestinal: Negative.   Musculoskeletal: Negative.   Allergic/Immunologic: Negative.   Neurological: Negative.   Hematological: Negative.   Psychiatric/Behavioral:  Negative.     Blood pressure 127/79, pulse 63, temperature (!) 97.5 F (36.4 C), resp. rate 14, height _0  (1.626 m), weight 235 lb (106.6 kg), SpO2 95 %.  Physical Exam Physical Exam Exam conducted with a chaperone present.  Constitutional:      Appearance: She is well-developed.  HENT:     Mouth/Throat:     Pharynx: No oropharyngeal exudate.  Eyes:     General: No scleral icterus.    Conjunctiva/sclera: Conjunctivae normal.  Neck:     Musculoskeletal: Neck supple.  Cardiovascular:     Rate and Rhythm: Normal rate and regular rhythm.     Heart sounds: Normal heart sounds.  Pulmonary:     Effort: Pulmonary effort is normal.     Breath sounds: Normal breath sounds.  Chest:     Breasts:        Right: Inverted nipple (minimal loss of nipple height. No discharge. ) and skin change present. No mass, nipple discharge or tenderness.        Left: No inverted nipple, mass, nipple discharge, skin change or tenderness.       Comments: Bruising at biopsy site right breast Musculoskeletal: Normal range of motion.  Lymphadenopathy:     Cervical: No cervical adenopathy.     Upper Body:     Right upper body: No supraclavicular or axillary adenopathy.     Left upper body: No supraclavicular or axillary adenopathy.  Skin:    General: Skin is warm and dry.  Neurological:     General: No focal deficit present.     Mental Status: She is alert and oriented to person, place, and time.  Psychiatric:        Behavior: Behavior normal.     Data Reviewed Diagnostic mammograms and associated ultrasound of December 30, 2018 were independently reviewed. 3.2 cm mass in the 12 o'clock position of the right breast with multiple calcification.  A large lymph node.  Stable mass in the medial inferior aspect of the left breast.  Subsequent ultrasound of the left breast showed a lesion consistent with a fibroadenoma measuring 9 mm in greatest dimension.  Ultrasound of the right breast shows a dominant  mass described on mammograms as well as an irregular lymph node.  Biopsy results January 06, 2019 were reviewed: DIAGNOSIS:  A. BREAST, RIGHT 12:30, 2 CMFN; ULTRASOUND GUIDED BIOPSY:  - INVASIVE MAMMARY CARCINOMA, NO SPECIAL TYPE.   Size of invasive carcinoma: 11 mm in this sample  Histologic grade of invasive carcinoma: Grade 2            Glandular/tubular differentiation score: 3  Nuclear pleomorphism score: 2            Mitotic rate score: 1            Total score: 6  Ductal carcinoma in situ: Not identified   B. LYMPH NODE, RIGHT AXILLA; ULTRASOUND GUIDED BIOPSY:  - METASTATIC MAMMARY CARCINOMA INVOLVING LYMPH NODE TISSUE.  - CARCINOMA MEASURES 8 MM IN THIS SAMPLE.   BREAST BIOMARKER TESTS  Estrogen Receptor (ER) Status: POSITIVE            Percentage of cells with nuclear positivity: >90%            Average intensity of staining: Strong   Progesterone Receptor (PgR) Status: POSITIVE            Percentage of cells with nuclear positivity: >90%            Average intensity of staining: Strong   HER2 (by immunohistochemistry): NEGATIVE (Score 1+)   Cold Ischemia and Fixation Times: Meet requirements specified in latest  version of the ASCO/CAP guidelines  Testing Performed on Block Number(s): A1   Assessment Clinical stage II carcinoma of the right breast with ER/PR positive, HER-2/neu negative tumor.  Plan  Greater than 50% of the visit hour-long was spent reviewing the options for breast cancer treatment. Breast conservation with lumpectomy and radiation therapy  was presented as equivalent to mastectomy for long-term control. The pros and cons of each treatment regimen were reviewed.   With the positive axillary node, but favorable hormonal receptor status, the potential benefit from MammaPrint or Oncotype DX testing to to help determine whether neoadjuvant/adjuvant  chemotherapy would be appropriate may well be indicated.  PET scan to assess for distant disease in light of the metastatic lymph node would be appropriate.  Timing of surgery would certainly depend on recommendations from medical oncology regarding neoadjuvant hormonal or chemotherapy.  Timing in relation to the recent [place] virus pandemic also fits into the equation at this time.  Website for additional information were provided.  I did speak with Dr. Grayland Ormond from medical oncology prior to the patient's upcoming appointment on Wednesday, April 29.  HPI, Physical Exam, Assessment and Plan have been scribed under the direction and in the presence of Robert Bellow, MD  Nichole Living, LPN HPI, assessment, plan and physical exam has been scribed under the direction and in the presence of Robert Bellow, MD. Nichole Fetch, RN  I have completed the exam and reviewed the above documentation for accuracy and completeness.  I agree with the above.  Haematologist has been used and any errors in dictation or transcription are unintentional.  Hervey Ard, M.D., F.A.C.S.   Nichole Martinez 01/13/2019, 12:40 PM

## 2019-01-13 ENCOUNTER — Other Ambulatory Visit: Payer: Self-pay

## 2019-01-13 ENCOUNTER — Encounter: Payer: Self-pay | Admitting: Oncology

## 2019-01-13 ENCOUNTER — Inpatient Hospital Stay: Payer: Medicare Other | Attending: Oncology | Admitting: Oncology

## 2019-01-13 VITALS — BP 132/84 | HR 80 | Temp 98.4°F

## 2019-01-13 DIAGNOSIS — Z79811 Long term (current) use of aromatase inhibitors: Secondary | ICD-10-CM | POA: Diagnosis not present

## 2019-01-13 DIAGNOSIS — N6453 Retraction of nipple: Secondary | ICD-10-CM | POA: Insufficient documentation

## 2019-01-13 DIAGNOSIS — E119 Type 2 diabetes mellitus without complications: Secondary | ICD-10-CM | POA: Insufficient documentation

## 2019-01-13 DIAGNOSIS — Z885 Allergy status to narcotic agent status: Secondary | ICD-10-CM | POA: Diagnosis not present

## 2019-01-13 DIAGNOSIS — C50111 Malignant neoplasm of central portion of right female breast: Secondary | ICD-10-CM | POA: Diagnosis not present

## 2019-01-13 DIAGNOSIS — Z87891 Personal history of nicotine dependence: Secondary | ICD-10-CM | POA: Diagnosis not present

## 2019-01-13 DIAGNOSIS — Z79899 Other long term (current) drug therapy: Secondary | ICD-10-CM | POA: Diagnosis not present

## 2019-01-13 DIAGNOSIS — Z7984 Long term (current) use of oral hypoglycemic drugs: Secondary | ICD-10-CM | POA: Insufficient documentation

## 2019-01-13 DIAGNOSIS — Z170 Estrogen receptor positive status [ER+]: Secondary | ICD-10-CM

## 2019-01-13 DIAGNOSIS — Z791 Long term (current) use of non-steroidal anti-inflammatories (NSAID): Secondary | ICD-10-CM | POA: Diagnosis not present

## 2019-01-13 MED ORDER — LETROZOLE 2.5 MG PO TABS: 2.5000 mg | ORAL_TABLET | Freq: Every day | ORAL | 3 refills | Status: DC

## 2019-01-13 NOTE — Progress Notes (Signed)
[place]  [phone]) [phone] Fax:(336) [phone]  ID: DELORES EDELSTEIN OB: 1951/07/10  MR#: [id number]  MHD#:[id number]  Patient Care Team: [name], PA-C as PCP - General (Family Medicine)  CHIEF COMPLAINT: Clinical stage IIa ER/PR positive, HER-2 negative invasive carcinoma of the central portion of the right breast.  INTERVAL HISTORY: Patient is a 68 year old female who noted had a palpable mass in her right breast.  Subsequent ultrasound and biopsy revealed the above-stated breast cancer.  She is anxious, but otherwise feels well.  She has no neurologic complaints.  She denies any recent fevers or illnesses.  She has a good appetite and denies weight loss.  She denies any chest pain, shortness of breath, cough, or hemoptysis.  She has no nausea, vomiting, constipation, or diarrhea.  She has no urinary complaints.  Patient otherwise feels well and offers no further specific complaints today.  REVIEW OF SYSTEMS:   Review of Systems  Constitutional: Negative.  Negative for fever, malaise/fatigue and weight loss.  Respiratory: Negative.  Negative for cough, hemoptysis and shortness of breath.   Cardiovascular: Negative.  Negative for chest pain and leg swelling.  Gastrointestinal: Negative.  Negative for abdominal pain.  Genitourinary: Negative.  Negative for dysuria.  Musculoskeletal: Negative.   Skin: Negative.  Negative for rash.  Neurological: Negative.  Negative for focal weakness, weakness and headaches.  Psychiatric/Behavioral: The patient is nervous/anxious.     As per HPI. Otherwise, a complete review of systems is negative.  PAST MEDICAL HISTORY: Past Medical History:  Diagnosis Date   Allergy    Diabetes mellitus without complication ([place]) 2119   Hyperlipidemia    Hypertension    Osteoporosis    Trigeminal neuralgia     PAST SURGICAL HISTORY: Past Surgical History:  Procedure Laterality Date   ABDOMINAL HYSTERECTOMY   1978   BREAST BIOPSY Right 01/06/2019   [place] bx x 2.  Ribbon marker for mass and hydro for Lymph node. Path pending   BREAST CYST EXCISION Right 1986   BREAST EXCISIONAL BIOPSY     LAMINECTOMY  1995   L1, L2, L3   SPINE SURGERY      FAMILY HISTORY: Family History  Adopted: Yes  Family history unknown: Yes    ADVANCED DIRECTIVES (Y/N):  N  HEALTH MAINTENANCE: Social History   Tobacco Use   Smoking status: Former Smoker    Last attempt to quit: 03/16/2000    Years since quitting: 18.8   Smokeless tobacco: Never Used  Substance Use Topics   Alcohol use: No   Drug use: No     Colonoscopy:  PAP:  Bone density:  Lipid panel:  Allergies  Allergen Reactions   Morphine     Anaphallaxis.    Current Outpatient Medications  Medication Sig Dispense Refill   amLODipine (NORVASC) 5 MG tablet Take 1 tablet (5 mg total) by mouth daily. 90 tablet 3   baclofen (LIORESAL) 10 MG tablet Take 1 tablet (10 mg total) by mouth daily. 90 tablet 3   bisoprolol-hydrochlorothiazide (ZIAC) 10-6.25 MG tablet Take 1 tablet by mouth 2 (two) times daily. 180 tablet 3   Cholecalciferol (VITAMIN D3) 5000 units CAPS Take by mouth.     lisinopril-hydrochlorothiazide (PRINZIDE,ZESTORETIC) 10-12.5 MG tablet Take 1 tablet by mouth daily. 90 tablet 3   loratadine (CLARITIN) 10 MG tablet Take 1 tablet (10 mg total) by mouth daily as needed. 90 tablet 3   meloxicam (MOBIC) 15 MG tablet Take 1 tablet (15 mg total) by  mouth daily. 90 tablet 3   metFORMIN (GLUCOPHAGE) 1000 MG tablet Take 1 tablet (1,000 mg total) by mouth 2 (two) times daily with a meal. 180 tablet 3   MULTIPLE VITAMINS PO MULTIVITAMINS (Oral Tablet)  1 Every Day for 0 days  Quantity: 0.00;  Refills: 0   Ordered :17-Apr-2011  [name] ;  Started 24-February-2009 Active     OMEGA 3-6-9 FATTY ACIDS PO Reported on 10/04/2015     simvastatin (ZOCOR) 20 MG tablet Take 1 tablet (20 mg total) by mouth at bedtime. 90 tablet 3    sitaGLIPtin (JANUVIA) 100 MG tablet Take 1 tablet (100 mg total) by mouth daily. 90 tablet 3   terbinafine (LAMISIL) 250 MG tablet Take 1 tablet (250 mg total) by mouth daily. 90 tablet 0   Vitamin D, Ergocalciferol, (DRISDOL) 50000 units CAPS capsule Take 1 capsule (50,000 Units total) by mouth every 7 (seven) days. 12 capsule 4   vitamin E 1000 UNIT capsule Take 1,000 Units by mouth daily.     ALPRAZolam (XANAX) 1 MG tablet TAKE ONE TABLET BY MOUTH AT BEDTIME AS NEEDED FOR ANXIETY (Patient not taking: Reported on 01/13/2019) 90 tablet 1   letrozole (FEMARA) 2.5 MG tablet Take 1 tablet (2.5 mg total) by mouth daily. 90 tablet 3   No current facility-administered medications for this visit.     OBJECTIVE: Vitals:   01/13/19 1147  BP: 132/84  Pulse: 80  Temp: 98.4 F (36.9 C)     There is no height or weight on file to calculate BMI.    ECOG FS:0 - Asymptomatic  General: Well-developed, well-nourished, no acute distress. Eyes: Pink conjunctiva, anicteric sclera. HEENT: Normocephalic, moist mucous membranes, clear oropharnyx. Breast: Patient requested exam be deferred today. Lungs: Clear to auscultation bilaterally. Heart: Regular rate and rhythm. No rubs, murmurs, or gallops. Abdomen: Soft, nontender, nondistended. No organomegaly noted, normoactive bowel sounds. Musculoskeletal: No edema, cyanosis, or clubbing. Neuro: Alert, answering all questions appropriately. Cranial nerves grossly intact. Skin: No rashes or petechiae noted. Psych: Normal affect. Lymphatics: No cervical, calvicular, axillary or inguinal LAD.   LAB RESULTS:  Lab Results  Component Value Date   NA 141 05/07/2018   K 4.3 05/07/2018   CL 102 05/07/2018   CO2 23 05/07/2018   GLUCOSE 132 (H) 05/07/2018   BUN 21 05/07/2018   CREATININE 0.76 05/07/2018   CALCIUM 9.8 05/07/2018   PROT 7.0 05/07/2018   ALBUMIN 4.4 05/07/2018   AST 17 05/07/2018   ALT 16 05/07/2018   ALKPHOS 71 05/07/2018   BILITOT 0.5  05/07/2018   GFRNONAA 83 05/07/2018   GFRAA 96 05/07/2018    Lab Results  Component Value Date   WBC 7.7 05/07/2018   NEUTROABS 3.9 05/07/2018   HGB 13.8 05/07/2018   HCT 42.8 05/07/2018   MCV 82 05/07/2018   PLT 252 05/07/2018     STUDIES: US Breast Ltd Uni Left Inc Axilla  Result Date: 12/30/2018 CLINICAL DATA:  Palpable lump and nipple retraction on the right. EXAM: DIGITAL DIAGNOSTIC BILATERAL MAMMOGRAM WITH CAD AND TOMO ULTRASOUND BILATERAL BREAST COMPARISON:  Previous exam(s). ACR Breast Density Category c: The breast tissue is heterogeneously dense, which may obscure small masses. FINDINGS: There is a spiculated irregular mass in the right 12 o'clock retroareolar region measuring up to 3.2 cm mammographically. This mass contains multiple calcifications. There is at least 1 abnormal node in the right axilla. A mass in the lateral inferior right breast appears to be stable since 2004.  A mass in the slightly medial inferior left breast also appears to be stable, likely a calcifying fibroadenoma. No other suspicious mammographic findings. Mammographic images were processed with CAD. On physical exam, a lump is felt in the right periareolar region. Targeted ultrasound is performed, showing a mass in the medial left breast at 8 o'clock, 5 cm from the nipple measuring 8 x 5 x 9 mm, correlating with the stable mass and consistent with a fibroadenoma. The patient's palpable lump on the right is seen at 12:30, 2 cm from the nipple measuring 2.7 x 1.9 x 2.3 cm. There is a cyst at 8 o'clock in the right breast correlating with the stable mass. There is a definitively abnormal node in the right axilla correlating with the mammographic finding with a cortex measuring up to 16 mm. There are several lymph nodes which are not enlarged when measured in total. However, no fatty hilum is seen and these nodes are suspicious. There are 4 of these nodes identified. There is another abnormal node with a thickened  cortex measuring 5 mm and restriction of the fatty hilum. IMPRESSION: 1. There is a highly suspicious mass palpated by the patient in the right breast at 12:30, 2 cm from the nipple. The mass contains calcifications. 2. There are 2 abnormal right axillary nodes and 4 additional suspicious nodes. 3. No other evidence of malignancy in either breast. RECOMMENDATION: Recommend biopsy of the 12:30 right breast mass and the largest most abnormal right axillary lymph node. If the biopsies demonstrate malignancy, breast MRI would be recommended if breast conservation is considered. I have discussed the findings and recommendations with the patient. Results were also provided in writing at the conclusion of the visit. If applicable, a reminder letter will be sent to the patient regarding the next appointment. BI-RADS CATEGORY  5: Highly suggestive of malignancy. Electronically Signed   By: Dorise Bullion III M.D   On: 12/30/2018 15:27   US Breast Ltd Uni Right Inc Axilla  Result Date: 12/30/2018 CLINICAL DATA:  Palpable lump and nipple retraction on the right. EXAM: DIGITAL DIAGNOSTIC BILATERAL MAMMOGRAM WITH CAD AND TOMO ULTRASOUND BILATERAL BREAST COMPARISON:  Previous exam(s). ACR Breast Density Category c: The breast tissue is heterogeneously dense, which may obscure small masses. FINDINGS: There is a spiculated irregular mass in the right 12 o'clock retroareolar region measuring up to 3.2 cm mammographically. This mass contains multiple calcifications. There is at least 1 abnormal node in the right axilla. A mass in the lateral inferior right breast appears to be stable since 2004. A mass in the slightly medial inferior left breast also appears to be stable, likely a calcifying fibroadenoma. No other suspicious mammographic findings. Mammographic images were processed with CAD. On physical exam, a lump is felt in the right periareolar region. Targeted ultrasound is performed, showing a mass in the medial left  breast at 8 o'clock, 5 cm from the nipple measuring 8 x 5 x 9 mm, correlating with the stable mass and consistent with a fibroadenoma. The patient's palpable lump on the right is seen at 12:30, 2 cm from the nipple measuring 2.7 x 1.9 x 2.3 cm. There is a cyst at 8 o'clock in the right breast correlating with the stable mass. There is a definitively abnormal node in the right axilla correlating with the mammographic finding with a cortex measuring up to 16 mm. There are several lymph nodes which are not enlarged when measured in total. However, no fatty hilum is seen and these  nodes are suspicious. There are 4 of these nodes identified. There is another abnormal node with a thickened cortex measuring 5 mm and restriction of the fatty hilum. IMPRESSION: 1. There is a highly suspicious mass palpated by the patient in the right breast at 12:30, 2 cm from the nipple. The mass contains calcifications. 2. There are 2 abnormal right axillary nodes and 4 additional suspicious nodes. 3. No other evidence of malignancy in either breast. RECOMMENDATION: Recommend biopsy of the 12:30 right breast mass and the largest most abnormal right axillary lymph node. If the biopsies demonstrate malignancy, breast MRI would be recommended if breast conservation is considered. I have discussed the findings and recommendations with the patient. Results were also provided in writing at the conclusion of the visit. If applicable, a reminder letter will be sent to the patient regarding the next appointment. BI-RADS CATEGORY  5: Highly suggestive of malignancy. Electronically Signed   By: Dorise Bullion III M.D   On: 12/30/2018 15:27   Mm Diag Breast Tomo Bilateral  Result Date: 12/30/2018 CLINICAL DATA:  Palpable lump and nipple retraction on the right. EXAM: DIGITAL DIAGNOSTIC BILATERAL MAMMOGRAM WITH CAD AND TOMO ULTRASOUND BILATERAL BREAST COMPARISON:  Previous exam(s). ACR Breast Density Category c: The breast tissue is  heterogeneously dense, which may obscure small masses. FINDINGS: There is a spiculated irregular mass in the right 12 o'clock retroareolar region measuring up to 3.2 cm mammographically. This mass contains multiple calcifications. There is at least 1 abnormal node in the right axilla. A mass in the lateral inferior right breast appears to be stable since 2004. A mass in the slightly medial inferior left breast also appears to be stable, likely a calcifying fibroadenoma. No other suspicious mammographic findings. Mammographic images were processed with CAD. On physical exam, a lump is felt in the right periareolar region. Targeted ultrasound is performed, showing a mass in the medial left breast at 8 o'clock, 5 cm from the nipple measuring 8 x 5 x 9 mm, correlating with the stable mass and consistent with a fibroadenoma. The patient's palpable lump on the right is seen at 12:30, 2 cm from the nipple measuring 2.7 x 1.9 x 2.3 cm. There is a cyst at 8 o'clock in the right breast correlating with the stable mass. There is a definitively abnormal node in the right axilla correlating with the mammographic finding with a cortex measuring up to 16 mm. There are several lymph nodes which are not enlarged when measured in total. However, no fatty hilum is seen and these nodes are suspicious. There are 4 of these nodes identified. There is another abnormal node with a thickened cortex measuring 5 mm and restriction of the fatty hilum. IMPRESSION: 1. There is a highly suspicious mass palpated by the patient in the right breast at 12:30, 2 cm from the nipple. The mass contains calcifications. 2. There are 2 abnormal right axillary nodes and 4 additional suspicious nodes. 3. No other evidence of malignancy in either breast. RECOMMENDATION: Recommend biopsy of the 12:30 right breast mass and the largest most abnormal right axillary lymph node. If the biopsies demonstrate malignancy, breast MRI would be recommended if breast  conservation is considered. I have discussed the findings and recommendations with the patient. Results were also provided in writing at the conclusion of the visit. If applicable, a reminder letter will be sent to the patient regarding the next appointment. BI-RADS CATEGORY  5: Highly suggestive of malignancy. Electronically Signed   By: Dorise Bullion  III M.D   On: 12/30/2018 15:27   Mm Clip Placement Right  Result Date: 01/06/2019 CLINICAL DATA:  Confirmation of clip placement after ultrasound-guided core needle biopsy of a highly suspicious mass in the UPPER INNER RIGHT breast and biopsy of a pathologic level 1 RIGHT axillary lymph node. EXAM: DIAGNOSTIC RIGHT MAMMOGRAM POST ULTRASOUND BIOPSY COMPARISON:  Previous exam(s). FINDINGS: Mammographic images were obtained following ultrasound guided biopsy of a mass in the UPPER INNER periareolar RIGHT breast and biopsy of a pathologic RIGHT axillary lymph node. The ribbon shaped tissue marker clip is appropriately positioned within the biopsied mass in the UPPER INNER periareolar RIGHT breast. The HydroMark butterfly shaped tissue marker clip is appropriately positioned within the biopsied level 1 lymph node in the low RIGHT axilla. Expected post biopsy changes are present at both sites without evidence of hematoma. IMPRESSION: 1. Appropriate positioning of the ribbon shaped tissue marker clip within the biopsied mass in the UPPER INNER RIGHT breast at ANTERIOR depth. 2. Appropriate positioning of the HydroMark butterfly shaped tissue marker clip within the biopsied level 1 lymph node in the low RIGHT axilla. Final Assessment: Post Procedure Mammograms for Marker Placement Electronically Signed   By: Evangeline Dakin M.D.   On: 01/06/2019 14:11   [place] Rt Breast Bx W Loc Dev 1st Lesion Img Bx Spec US Guide  Addendum Date: 01/08/2019   ADDENDUM REPORT: 01/08/2019 13:51 ADDENDUM: Surgical consultation has been arranged with Dr. Robert Bellow at [facility] on 01/12/2019. Stacie Acres, RN on 01/08/2019. Electronically Signed   By: Evangeline Dakin M.D.   On: 01/08/2019 13:51   Addendum Date: 01/08/2019   ADDENDUM REPORT: 01/07/2019 17:07 ADDENDUM: Pathology revealed GRADE 2 INVASIVE MAMMARY CARCINOMA, NO SPECIAL TYPE of the RIGHT Breast, 12:30, 2 cm from nipple. This was found to be concordant by Dr. Peggye Fothergill. Pathology revealed METASTATIC MAMMARY CARCINOMA INVOLVING LYMPH NODE TISSUE of the RIGHT AXILLA. This was found to be concordant by Dr. Peggye Fothergill. Pathology results were discussed with the patient by telephone. The patent reported doing well after the biopsy with no bleeding and tenderness at the biopsy sites. The patient was encouraged to call [facility] for any additional concerns or questions. Patient states at this time that she desires mastectomy. If breast sparing is contemplated or if neoadjuvant chemotherapy is done, bilateral MRI is recommended. Requests for surgical and oncology referrals were relayed to Tanya Nones, RN and Al Pimple RN, oncology Nurse Navigators at [facility] at [facility] via [place] message. The provider will be notified of the results and recommendations by [facility]. Pathology results reported by Stacie Acres, RN on 01/07/2019. Electronically Signed   By: Evangeline Dakin M.D.   On: 01/07/2019 17:07   Result Date: 01/08/2019 CLINICAL DATA:  68 year old presenting with a palpable lump in the UPPER periareolar RIGHT breast, shown on diagnostic workup to have a highly suspicious approximate 2.7 cm mass associated with architectural distortion and calcifications in the UPPER INNER QUADRANT at the 12:30 o'clock position approximately 2 cm from nipple. The diagnostic workup also demonstrated two pathologic RIGHT axillary lymph nodes and 4 other lymph nodes which were suspicious. Biopsy of the RIGHT breast mass and the largest pathologic lymph node are  performed. EXAM: ULTRASOUND GUIDED RIGHT BREAST CORE NEEDLE BIOPSY ULTRASOUND-GUIDED CORE NEEDLE BIOPSY OF A RIGHT AXILLARY LYMPH NODE COMPARISON:  Previous exam(s). FINDINGS: I met with the patient and we discussed the procedure of ultrasound-guided biopsy, including benefits  and alternatives. We discussed the high likelihood of a successful procedure. We discussed the risks of the procedure, including infection, bleeding, tissue injury, clip migration, and inadequate sampling. Informed written consent was given. The usual time-out protocol was performed immediately prior to the procedure. # 1) RIGHT breast, lesion quadrant: UPPER INNER QUADRANT. Using sterile technique with chlorhexidine as skin antisepsis, 1% Lidocaine as local anesthetic, under direct ultrasound visualization, a 12 gauge Bard Marquee core needle device placed through an 11 gauge introducer needle was used to perform biopsy of the mass in the UPPER INNER RIGHT breast using a LATERAL approach. At the conclusion of the procedure a ribbon shaped tissue marker clip was deployed into the biopsy cavity. # 2: RIGHT axilla: Using sterile technique with chlorhexidine as skin antisepsis, 1% lidocaine as local anesthetic, under direct ultrasound visualization, a 14 gauge Bard Marquee core needle device placed through a 13 gauge introducer needle was used perform biopsy of the pathologic level 1 RIGHT axillary lymph node using an inferolateral approach. At the conclusion of the procedure a HydroMark butterfly shaped tissue marker clip was deployed into the biopsy cavity. Follow up 2 view mammogram was performed and dictated separately. IMPRESSION: Ultrasound guided biopsy of a highly suspicious mass involving the UPPER INNER periareolar RIGHT breast and biopsy of a pathologic level 1 RIGHT axillary lymph node. No apparent complications. Electronically Signed: By: Evangeline Dakin M.D. On: 01/06/2019 14:11   [place] Rt Breast Bx W Loc Dev Ea Add Lesion Img Bx  Spec US Guide  Addendum Date: 01/08/2019   ADDENDUM REPORT: 01/08/2019 13:51 ADDENDUM: Surgical consultation has been arranged with Dr. Robert Bellow at [facility] on 01/12/2019. Stacie Acres, RN on 01/08/2019. Electronically Signed   By: Evangeline Dakin M.D.   On: 01/08/2019 13:51   Addendum Date: 01/08/2019   ADDENDUM REPORT: 01/07/2019 17:07 ADDENDUM: Pathology revealed GRADE 2 INVASIVE MAMMARY CARCINOMA, NO SPECIAL TYPE of the RIGHT Breast, 12:30, 2 cm from nipple. This was found to be concordant by Dr. Peggye Fothergill. Pathology revealed METASTATIC MAMMARY CARCINOMA INVOLVING LYMPH NODE TISSUE of the RIGHT AXILLA. This was found to be concordant by Dr. Peggye Fothergill. Pathology results were discussed with the patient by telephone. The patent reported doing well after the biopsy with no bleeding and tenderness at the biopsy sites. The patient was encouraged to call [facility] for any additional concerns or questions. Patient states at this time that she desires mastectomy. If breast sparing is contemplated or if neoadjuvant chemotherapy is done, bilateral MRI is recommended. Requests for surgical and oncology referrals were relayed to Tanya Nones, RN and Al Pimple RN, oncology Nurse Navigators at [facility] via [place] message. The provider will be notified of the results and recommendations by [facility]. Pathology results reported by Stacie Acres, RN on 01/07/2019. Electronically Signed   By: Evangeline Dakin M.D.   On: 01/07/2019 17:07   Result Date: 01/08/2019 CLINICAL DATA:  68 year old presenting with a palpable lump in the UPPER periareolar RIGHT breast, shown on diagnostic workup to have a highly suspicious approximate 2.7 cm mass associated with architectural distortion and calcifications in the UPPER INNER QUADRANT at the 12:30 o'clock position approximately 2 cm from nipple. The diagnostic workup also  demonstrated two pathologic RIGHT axillary lymph nodes and 4 other lymph nodes which were suspicious. Biopsy of the RIGHT breast mass and the largest pathologic lymph node are performed. EXAM: ULTRASOUND GUIDED RIGHT BREAST CORE NEEDLE BIOPSY  ULTRASOUND-GUIDED CORE NEEDLE BIOPSY OF A RIGHT AXILLARY LYMPH NODE COMPARISON:  Previous exam(s). FINDINGS: I met with the patient and we discussed the procedure of ultrasound-guided biopsy, including benefits and alternatives. We discussed the high likelihood of a successful procedure. We discussed the risks of the procedure, including infection, bleeding, tissue injury, clip migration, and inadequate sampling. Informed written consent was given. The usual time-out protocol was performed immediately prior to the procedure. # 1) RIGHT breast, lesion quadrant: UPPER INNER QUADRANT. Using sterile technique with chlorhexidine as skin antisepsis, 1% Lidocaine as local anesthetic, under direct ultrasound visualization, a 12 gauge Bard Marquee core needle device placed through an 11 gauge introducer needle was used to perform biopsy of the mass in the UPPER INNER RIGHT breast using a LATERAL approach. At the conclusion of the procedure a ribbon shaped tissue marker clip was deployed into the biopsy cavity. # 2: RIGHT axilla: Using sterile technique with chlorhexidine as skin antisepsis, 1% lidocaine as local anesthetic, under direct ultrasound visualization, a 14 gauge Bard Marquee core needle device placed through a 13 gauge introducer needle was used perform biopsy of the pathologic level 1 RIGHT axillary lymph node using an inferolateral approach. At the conclusion of the procedure a HydroMark butterfly shaped tissue marker clip was deployed into the biopsy cavity. Follow up 2 view mammogram was performed and dictated separately. IMPRESSION: Ultrasound guided biopsy of a highly suspicious mass involving the UPPER INNER periareolar RIGHT breast and biopsy of a pathologic level 1  RIGHT axillary lymph node. No apparent complications. Electronically Signed: By: Evangeline Dakin M.D. On: 01/06/2019 14:11    ASSESSMENT: Clinical stage IIa ER/PR positive, HER-2 negative invasive carcinoma of the central portion of the right breast.  PLAN:   1. Clinical stage IIa ER/PR positive, HER-2 negative invasive carcinoma of the central portion of the right breast: Although patient is clinical stage IIa and typically neoadjuvant chemotherapy is the recommendation, patient does not wish to pursue aggressive immunosuppressive treatment during the COVID-19 pandemic.  Because of this, it was agreed upon to initiate neoadjuvant letrozole.  Patient expressed understanding that she will more than likely require chemotherapy in the future.  Will send Oncotype DX score for completeness, but this likely will not affect overall recommendations.  Patient is also adopted, therefore genetic referral has been placed.  Will get CT scan of the chest, abdomen, and pelvis in the next 1 to 2 weeks to complete the staging work-up.  Patient will also require a baseline bone mineral density.  Return to clinic in 3 months for further evaluation and additional treatment planning.  I spent a total of 60 minutes face-to-face with the patient of which greater than 50% of the visit was spent in counseling and coordination of care as detailed above.    Patient expressed understanding and was in agreement with this plan. She also understands that She can call clinic at any time with any questions, concerns, or complaints.   Cancer Staging Cancer of central portion of right female breast [facility]) Staging form: Breast, AJCC 8th Edition - Clinical stage from 01/13/2019: Stage IIA (cT2, cN1, cM0, G1, ER+, PR+, HER2-) - Signed by Lloyd Huger, MD on 01/13/2019   Lloyd Huger, MD   01/13/2019 1:50 PM

## 2019-01-13 NOTE — Progress Notes (Signed)
Patient is here to establish care for malignant neoplasm of her right breast.

## 2019-01-13 NOTE — Addendum Note (Signed)
Addended by: Wayna Chalet on: 01/13/2019 02:32 PM   Modules accepted: Orders

## 2019-01-14 ENCOUNTER — Other Ambulatory Visit: Payer: BC Managed Care – PPO

## 2019-01-14 ENCOUNTER — Encounter: Payer: Self-pay | Admitting: *Deleted

## 2019-01-14 ENCOUNTER — Telehealth: Payer: Self-pay | Admitting: Licensed Clinical Social Worker

## 2019-01-14 NOTE — Progress Notes (Signed)
  Oncology Nurse Navigator Documentation  Navigator Location: [facility] (01/14/19 1000)   )Navigator Encounter Type: Telephone (01/14/19 1000) Telephone: Outgoing Call (01/14/19 1000)                 Treatment Initiated Date: 01/13/19 (01/14/19 1000)         Interventions: Education (01/14/19 1000)                      Time Spent with Patient: 15 (01/14/19 1000)   Called patient to follow up from her consult with Dr. Grayland Ormond yesterday.  He has started her on neoadjuvant antihormonal therapy due to the COVID 19 pandemic.  Mammoprint or Oncotype DX testing is being sent.  Patient picked up her patient breast cancer educational literature, "My Breast Cancer Treatment Handbook" by Josephine Igo, RN at her appointment with Dr. Bary Castilla.  She is to call if she has any questions or needs.

## 2019-01-14 NOTE — Progress Notes (Signed)
Tumor Board Documentation  Nichole Martinez was presented by Dr Bary Castilla and Dr Grayland Ormond at our Tumor Board on 01/14/2019, which included representatives from medical oncology, radiation oncology, navigation, pathology, radiology, surgical, surgical oncology, internal medicine, genetics, pulmonology, research.  Nichole Martinez currently presents for discussion, for [place], for new positive pathology with history of the following treatments: surgical intervention(s), active survellience.  Additionally, we reviewed previous medical and familial history, history of present illness, and recent lab results along with all available histopathologic and imaging studies. The tumor board considered available treatment options and made the following recommendations: Hormonal therapy, Surgery Neoadjuvant AI therpy for 3 months then surgical reevaluation, Oncotype testing, Genetics referral.. If Ai does not work or the tumor grows, Neoadjusvant chemotherapy would be recommended  The following procedures/referrals were also placed: No orders of the defined types were placed in this encounter.   Clinical Trial Status: not discussed   Staging used: Clinical Stage  AJCC Staging:       Group: Clinical Stage 2A   National site-specific guidelines NCCN were discussed with respect to the case.  Tumor board is a meeting of clinicians from various specialty areas who evaluate and discuss patients for whom a multidisciplinary approach is being considered. Final determinations in the plan of care are those of the provider(s). The responsibility for follow up of recommendations given during tumor board is that of the provider.   Today's extended care, comprehensive team conference, Nichole Martinez was not present for the discussion and was not examined.   Multidisciplinary Tumor Board is a multidisciplinary case peer review process.  Decisions discussed in the Multidisciplinary Tumor Board reflect the opinions of the specialists  present at the conference without having examined the patient.  Ultimately, treatment and diagnostic decisions rest with the primary provider(s) and the patient.

## 2019-01-15 DIAGNOSIS — Z1159 Encounter for screening for other viral diseases: Secondary | ICD-10-CM | POA: Diagnosis not present

## 2019-01-18 NOTE — Telephone Encounter (Signed)
Offered Nichole Martinez a genetic counseling visit virtually. She had questions about what genetic counseling is. We spoke about this, and she feels at this time she would not like to have a genetic counseling visit or genetic testing. She says she may want to revisit at a later time.

## 2019-01-21 ENCOUNTER — Encounter: Payer: Self-pay | Admitting: Oncology

## 2019-01-21 DIAGNOSIS — Z170 Estrogen receptor positive status [ER+]: Secondary | ICD-10-CM | POA: Diagnosis not present

## 2019-01-21 DIAGNOSIS — C50111 Malignant neoplasm of central portion of right female breast: Secondary | ICD-10-CM | POA: Diagnosis not present

## 2019-01-22 ENCOUNTER — Encounter: Payer: Self-pay | Admitting: Oncology

## 2019-01-26 ENCOUNTER — Telehealth: Payer: Self-pay | Admitting: *Deleted

## 2019-01-26 NOTE — Telephone Encounter (Signed)
Per Tillie Rung 01/26/19 staff message to cancel CT scheduled on 5/13 (pending denial) CT was cancelled as requested. Called and left a detailed message on pts vmail making her aware that the scheduled 5/13 CT has been cancelled.

## 2019-01-27 ENCOUNTER — Ambulatory Visit: Admission: RE | Admit: 2019-01-27 | Payer: Medicare Other | Source: Ambulatory Visit

## 2019-01-27 ENCOUNTER — Other Ambulatory Visit: Payer: Self-pay

## 2019-01-27 DIAGNOSIS — C50111 Malignant neoplasm of central portion of right female breast: Secondary | ICD-10-CM

## 2019-01-27 DIAGNOSIS — Z170 Estrogen receptor positive status [ER+]: Secondary | ICD-10-CM

## 2019-01-29 ENCOUNTER — Telehealth: Payer: Self-pay | Admitting: *Deleted

## 2019-01-29 DIAGNOSIS — C50111 Malignant neoplasm of central portion of right female breast: Secondary | ICD-10-CM

## 2019-01-29 NOTE — Telephone Encounter (Signed)
Patient needs to have a creatinine before her CT can be done Wednesday

## 2019-02-01 NOTE — Telephone Encounter (Signed)
Order for CMP is in the system. Patient will be called to come in and have it drawn before Wednesday.

## 2019-02-02 ENCOUNTER — Other Ambulatory Visit: Payer: Self-pay

## 2019-02-03 ENCOUNTER — Ambulatory Visit
Admission: RE | Admit: 2019-02-03 | Discharge: 2019-02-03 | Disposition: A | Discharge status: Home / Self Care | Payer: Medicare Other | Source: Ambulatory Visit | Attending: Oncology | Admitting: Oncology

## 2019-02-03 ENCOUNTER — Inpatient Hospital Stay: Payer: Medicare Other | Attending: Hematology and Oncology

## 2019-02-03 DIAGNOSIS — C50111 Malignant neoplasm of central portion of right female breast: Secondary | ICD-10-CM | POA: Diagnosis not present

## 2019-02-03 DIAGNOSIS — Z170 Estrogen receptor positive status [ER+]: Secondary | ICD-10-CM | POA: Diagnosis not present

## 2019-02-03 DIAGNOSIS — C50911 Malignant neoplasm of unspecified site of right female breast: Secondary | ICD-10-CM | POA: Diagnosis not present

## 2019-02-03 DIAGNOSIS — E119 Type 2 diabetes mellitus without complications: Secondary | ICD-10-CM | POA: Insufficient documentation

## 2019-02-03 LAB — COMPREHENSIVE METABOLIC PANEL
ALT: 16 U/L (ref 0–44)
AST: 18 U/L (ref 15–41)
Albumin: 4 g/dL (ref 3.5–5.0)
Alkaline Phosphatase: 59 U/L (ref 38–126)
Anion gap: 8 (ref 5–15)
BUN: 28 mg/dL — ABNORMAL HIGH (ref 8–23)
CO2: 25 mmol/L (ref 22–32)
Calcium: 9.2 mg/dL (ref 8.9–10.3)
Chloride: 104 mmol/L (ref 98–111)
Creatinine, Ser: 0.75 mg/dL (ref 0.44–1.00)
GFR calc Af Amer: >60 mL/min (ref >60)
GFR calc non Af Amer: >60 mL/min (ref >60)
Glucose, Bld: 145 mg/dL — ABNORMAL HIGH (ref 70–99)
Potassium: 4.3 mmol/L (ref 3.5–5.1)
Sodium: 137 mmol/L (ref 135–145)
Total Bilirubin: 0.6 mg/dL (ref 0.3–1.2)
Total Protein: 7 g/dL (ref 6.5–8.1)

## 2019-02-03 MED ORDER — IOHEXOL 300 MG/ML  SOLN
75.0000 mL | Freq: Once | INTRAMUSCULAR | Status: AC | PRN
  Administered 2019-02-03: 11:00:00 75 mL via INTRAVENOUS

## 2019-02-11 ENCOUNTER — Encounter: Payer: Self-pay | Admitting: Oncology

## 2019-02-11 ENCOUNTER — Telehealth: Payer: Self-pay | Admitting: *Deleted

## 2019-02-11 NOTE — Telephone Encounter (Signed)
Per my clinic note-   "Although patient is clinical stage IIa and typically neoadjuvant chemotherapy is the recommendation, patient does not wish to pursue aggressive immunosuppressive treatment during the COVID-19 pandemic.  Because of this, it was agreed upon to initiate neoadjuvant letrozole."  Can do a DOX visit next week.

## 2019-02-11 NOTE — Telephone Encounter (Signed)
Patient called stating she sees some discrepancies on her CT report and is concerned about them. She wants Dr Grayland Ormond to call her to go over them with her. Her next appointment is not until August. Please return her call [phone].  IMPRESSION: 3.0 cm mass in the central right breast, corresponding to the patient's known primary breast neoplasm.  Associated right axillary nodal metastases measuring up to 1.5 cm short axis.  Bilateral pulmonary nodules, including a 16 x 9 mm left lower lobe nodule, indeterminate. Short-term follow-up CT chest is suggested in 6-12 weeks.  3.2 cm left thyroid nodule, of questionable clinical significance in a patient with known malignancy. Consider follow-up thyroid ultrasound if clinically warranted.   Electronically Signed   By: Julian Hy M.D.   On: 02/04/2019 08:23

## 2019-02-11 NOTE — Telephone Encounter (Signed)
Pt is COVID 19 positive per notification from the Health Department. I have her scheduled for a Dox Visit on 6/1.

## 2019-02-12 ENCOUNTER — Other Ambulatory Visit: Payer: Self-pay

## 2019-02-12 NOTE — Progress Notes (Signed)
[place]  Telephone:(336) [phone] Fax:(336) [phone]  ID: Nichole Martinez OB: August 31, 1951  MR#: [id number]  MOL#:[id number]  Patient Care Team: [name] as PCP - General (Family Medicine)  I connected with Nichole Martinez on 02/15/19 at 10:15 AM EDT by video enabled telemedicine visit and verified that I am speaking with the correct person using two identifiers.   I discussed the limitations, risks, security and privacy concerns of performing an evaluation and management service by telemedicine and the availability of in-person appointments. I also discussed with the patient that there may be a patient responsible charge related to this service. The patient expressed understanding and agreed to proceed.   Other persons participating in the visit and their role in the encounter: Patient, MD  Patients location: Home Providers location: Clinic  CHIEF COMPLAINT: Clinical stage IIa ER/PR positive, HER-2 negative invasive carcinoma of the central portion of the right breast.  INTERVAL HISTORY: Patient agreed to video enabled telemedicine visit for routine follow-up and discussion of her imaging results.  She recently was found to be COVID positive and is now under a 10-day quarantine, but remains asymptomatic.  Her only complaints are of increased nausea and diarrhea.  She continues to be highly anxious, but otherwise feels well. She has no neurologic complaints.  She denies any fevers.  She has a good appetite and denies weight loss.  She denies any chest pain, shortness of breath, cough, or hemoptysis. She has no urinary complaints.  Patient offers no further specific complaints today.  REVIEW OF SYSTEMS:   Review of Systems  Constitutional: Negative.  Negative for fever, malaise/fatigue and weight loss.  Respiratory: Negative.  Negative for cough, hemoptysis and shortness of breath.   Cardiovascular: Negative.  Negative for chest pain and leg swelling.   Gastrointestinal: Positive for diarrhea and nausea. Negative for abdominal pain.  Genitourinary: Negative.  Negative for dysuria.  Musculoskeletal: Negative.   Skin: Negative.  Negative for rash.  Neurological: Negative.  Negative for focal weakness, weakness and headaches.  Psychiatric/Behavioral: The patient is nervous/anxious.     As per HPI. Otherwise, a complete review of systems is negative.  PAST MEDICAL HISTORY: Past Medical History:  Diagnosis Date   Allergy    Cancer [facility])    right breast cancer   Diabetes mellitus without complication ([place]) 2010   Hyperlipidemia    Hypertension    Osteoporosis    Trigeminal neuralgia     PAST SURGICAL HISTORY: Past Surgical History:  Procedure Laterality Date   ABDOMINAL HYSTERECTOMY  1978   BREAST BIOPSY Right 01/06/2019   [place] bx x 2.  Ribbon marker for mass and hydro for Lymph node. Path pending   BREAST CYST EXCISION Right 1986   BREAST EXCISIONAL BIOPSY     LAMINECTOMY  1995   L1, L2, L3   SPINE SURGERY      FAMILY HISTORY: Family History  Adopted: Yes  Family history unknown: Yes    ADVANCED DIRECTIVES (Y/N):  N  HEALTH MAINTENANCE: Social History   Tobacco Use   Smoking status: Former Smoker    Last attempt to quit: 03/16/2000    Years since quitting: 18.9   Smokeless tobacco: Never Used  Substance Use Topics   Alcohol use: No   Drug use: No     Colonoscopy:  PAP:  Bone density:  Lipid panel:  Allergies  Allergen Reactions   Morphine     Anaphallaxis.    Current Outpatient Medications  Medication Sig Dispense Refill   amLODipine (NORVASC) 5 MG tablet Take 1 tablet (5 mg total) by mouth daily. 90 tablet 3   baclofen (LIORESAL) 10 MG tablet Take 1 tablet (10 mg total) by mouth daily. 90 tablet 3   bisoprolol-hydrochlorothiazide (ZIAC) 10-6.25 MG tablet Take 1 tablet by mouth 2 (two) times daily. 180 tablet 3   buPROPion (WELLBUTRIN XL) 150 MG 24 hr tablet Take 150 mg by mouth  2 (two) times a day.     Cholecalciferol (VITAMIN D3) 5000 units CAPS Take by mouth.     letrozole (FEMARA) 2.5 MG tablet Take 1 tablet (2.5 mg total) by mouth daily. 90 tablet 3   lisinopril-hydrochlorothiazide (PRINZIDE,ZESTORETIC) 10-12.5 MG tablet Take 1 tablet by mouth daily. 90 tablet 3   loratadine (CLARITIN) 10 MG tablet Take 1 tablet (10 mg total) by mouth daily as needed. 90 tablet 3   meloxicam (MOBIC) 15 MG tablet Take 1 tablet (15 mg total) by mouth daily. 90 tablet 3   metFORMIN (GLUCOPHAGE) 1000 MG tablet Take 1 tablet (1,000 mg total) by mouth 2 (two) times daily with a meal. 180 tablet 3   MULTIPLE VITAMINS PO MULTIVITAMINS (Oral Tablet)  1 Every Day for 0 days  Quantity: 0.00;  Refills: 0   Ordered :17-Apr-2011  [name] ;  Started 24-February-2009 Active     OMEGA 3-6-9 FATTY ACIDS PO Reported on 10/04/2015     simvastatin (ZOCOR) 20 MG tablet Take 1 tablet (20 mg total) by mouth at bedtime. 90 tablet 3   sitaGLIPtin (JANUVIA) 100 MG tablet Take 1 tablet (100 mg total) by mouth daily. 90 tablet 3   terbinafine (LAMISIL) 250 MG tablet Take 1 tablet (250 mg total) by mouth daily. 90 tablet 0   Vitamin D, Ergocalciferol, (DRISDOL) 50000 units CAPS capsule Take 1 capsule (50,000 Units total) by mouth every 7 (seven) days. 12 capsule 4   vitamin E 1000 UNIT capsule Take 1,000 Units by mouth daily.     ALPRAZolam (XANAX) 1 MG tablet TAKE ONE TABLET BY MOUTH AT BEDTIME AS NEEDED FOR ANXIETY (Patient not taking: Reported on 02/15/2019) 90 tablet 1   No current facility-administered medications for this visit.     OBJECTIVE: There were no vitals filed for this visit.   There is no height or weight on file to calculate BMI.    ECOG FS:0 - Asymptomatic  General: Well-developed, well-nourished, no acute distress. HEENT: Normocephalic, moist mucous membranes. Lungs: Clear to auscultation bilaterally. Heart: Regular rate and rhythm. No rubs, murmurs, or gallops. Abdomen: Soft,  nontender, nondistended. No organomegaly noted, normoactive bowel sounds. Musculoskeletal: No edema, cyanosis, or clubbing. Neuro: Alert, answering all questions appropriately. Cranial nerves grossly intact. Skin: No rashes or petechiae noted. Psych: Normal affect.  LAB RESULTS:  Lab Results  Component Value Date   NA 137 02/03/2019   K 4.3 02/03/2019   CL 104 02/03/2019   CO2 25 02/03/2019   GLUCOSE 145 (H) 02/03/2019   BUN 28 (H) 02/03/2019   CREATININE 0.75 02/03/2019   CALCIUM 9.2 02/03/2019   PROT 7.0 02/03/2019   ALBUMIN 4.0 02/03/2019   AST 18 02/03/2019   ALT 16 02/03/2019   ALKPHOS 59 02/03/2019   BILITOT 0.6 02/03/2019   GFRNONAA >60 02/03/2019   GFRAA >60 02/03/2019    Lab Results  Component Value Date   WBC 7.7 05/07/2018   NEUTROABS 3.9 05/07/2018   HGB 13.8 05/07/2018   HCT 42.8 05/07/2018   MCV 82 05/07/2018  PLT 252 05/07/2018     STUDIES: Ct Chest W Contrast  Result Date: 02/04/2019 CLINICAL DATA:  Newly diagnosed right breast cancer EXAM: CT CHEST WITH CONTRAST TECHNIQUE: Multidetector CT imaging of the chest was performed during intravenous contrast administration. CONTRAST:  18m OMNIPAQUE IOHEXOL 300 MG/ML  SOLN COMPARISON:  None. FINDINGS: Cardiovascular: Heart is normal in size.  No pericardial effusion. No evidence of thoracic aortic aneurysm. Coronary atherosclerosis the LAD and right coronary artery. Mediastinum/Nodes: No suspicious mediastinal or hilar lymphadenopathy. Right axillary lymphadenopathy measuring up to 1.5 cm short axis (series 2/image 26), suspicious for nodal metastases. Visualized thyroid is notable for a dominant 3.2 cm left thyroid nodule. Lungs/Pleura: 16 x 9 mm left lower lobe nodule with surrounding ground-glass opacity (series 3/image 101), possibly infectious, although indeterminate. Additional 4 mm posterior right upper lobe nodule with surrounding ground-glass opacity (series 3/image 32). 5 mm right upper lobe nodule  (series 3/image 30). No focal consolidation. No pleural effusion or pneumothorax. Upper Abdomen: Visualized upper abdomen is grossly unremarkable, noting vascular calcifications. Musculoskeletal: 3.0 x 2.5 cm mass in the central right breast (series 2/image 38), corresponding to the patient's known primary breast neoplasm. Mild degenerative changes of the visualized thoracolumbar spine. IMPRESSION: 3.0 cm mass in the central right breast, corresponding to the patient's known primary breast neoplasm. Associated right axillary nodal metastases measuring up to 1.5 cm short axis. Bilateral pulmonary nodules, including a 16 x 9 mm left lower lobe nodule, indeterminate. Short-term follow-up CT chest is suggested in 6-12 weeks. 3.2 cm left thyroid nodule, of questionable clinical significance in a patient with known malignancy. Consider follow-up thyroid ultrasound if clinically warranted. Electronically Signed   By: SJulian HyM.D.   On: 02/04/2019 08:23    ASSESSMENT: Clinical stage IIa ER/PR positive, HER-2 negative invasive carcinoma of the central portion of the right breast.  Oncotype DX score 7.  PLAN:   1. Clinical stage IIa ER/PR positive, HER-2 negative invasive carcinoma of the central portion of the right breast: Although patient is clinical stage IIa and typically neoadjuvant chemotherapy is the recommendation, patient does not wish to pursue aggressive immunosuppressive treatment during the COVID-19 pandemic.  She was also recently found to be COVID positive.  Because of this, it was agreed upon to initiate neoadjuvant letrozole.  Patient expressed understanding that she will more than likely require chemotherapy in the future despite her Oncotype DX score.  CT scan results from Feb 04, 2019 reviewed independently and reported as above with no obvious evidence of metastatic disease.  Groundglass nodules noted in bilateral lungs could possibly be related to COVID.  Patient will have video enabled  telemedicine visit on March 18, 2019 to discuss her repeat imaging results.  2.  COVID positivity: Patient [place]-day quarantine per [place]  It is possible her symptoms of nausea and diarrhea are related to COVID, but this is unclear.  It is also possible that the groundglass density seen in her bilateral lungs are related to her COVID infection.  Will repeat CT scan of the chest in 6 weeks on March 17, 2019 to assess for interval change.  Follow-up as above. 3.  Nausea and diarrhea: Continue over-the-counter treatment.   I provided 25 minutes of face-to-face video visit time during this encounter, and > 50% was spent counseling as documented under my assessment & plan.   Patient expressed understanding and was in agreement with this plan. She also understands that She can call clinic at  any time with any questions, concerns, or complaints.   Cancer Staging Cancer of central portion of right female breast [facility]) Staging form: Breast, AJCC 8th Edition - Clinical stage from 01/13/2019: Stage IIA (cT2, cN1, cM0, G1, ER+, PR+, HER2-) - Signed by Lloyd Huger, MD on 01/13/2019   Lloyd Huger, MD   02/15/2019 1:07 PM

## 2019-02-15 ENCOUNTER — Inpatient Hospital Stay: Payer: Medicare Other | Attending: Oncology | Admitting: Oncology

## 2019-02-15 ENCOUNTER — Encounter: Payer: Self-pay | Admitting: Oncology

## 2019-02-15 DIAGNOSIS — E041 Nontoxic single thyroid nodule: Secondary | ICD-10-CM | POA: Diagnosis not present

## 2019-02-15 DIAGNOSIS — R918 Other nonspecific abnormal finding of lung field: Secondary | ICD-10-CM | POA: Diagnosis not present

## 2019-02-15 DIAGNOSIS — C50111 Malignant neoplasm of central portion of right female breast: Secondary | ICD-10-CM | POA: Diagnosis not present

## 2019-02-15 DIAGNOSIS — Z170 Estrogen receptor positive status [ER+]: Secondary | ICD-10-CM

## 2019-02-15 NOTE — Progress Notes (Signed)
Doximity visit today for follow up regarding CT results.

## 2019-02-19 ENCOUNTER — Telehealth: Payer: Self-pay | Admitting: Physician Assistant

## 2019-02-19 DIAGNOSIS — G500 Trigeminal neuralgia: Secondary | ICD-10-CM

## 2019-02-19 MED ORDER — PREDNISONE 20 MG PO TABS: 20.0000 mg | ORAL_TABLET | Freq: Every day | ORAL | 0 refills | Status: DC

## 2019-02-19 NOTE — Telephone Encounter (Signed)
Please advise 

## 2019-02-19 NOTE — Telephone Encounter (Signed)
Pt is positive for Covid 19 but has had no symptoms.  She has a friend who works for [employer] that was positive and that is how she contracted the virus.    She has a condition that causes nerve pain on the rt side of her face.  She needs a dose of the prednisone dose pac.  She is having symptoms of the nerve pain in her face as of last night.  She uses [address]  CB#  [phone]  thanks teri

## 2019-02-19 NOTE — Telephone Encounter (Signed)
Will send in prednisone 20mg  x 6 days.

## 2019-02-19 NOTE — Telephone Encounter (Signed)
Patient advised.

## 2019-03-14 NOTE — Progress Notes (Signed)
[place]  [phone] [phone]  ID: Nichole Martinez OB: May 17, 1951  MR#: [id number]  RPR#:[id number]  Patient Care Team: [name] as PCP - General (Family Medicine)  I connected with Nichole Martinez on 03/20/19 at  2:00 PM EDT by video enabled telemedicine visit and verified that I am speaking with the correct person using two identifiers.   I discussed the limitations, risks, security and privacy concerns of performing an evaluation and management service by telemedicine and the availability of in-person appointments. I also discussed with the patient that there may be a patient responsible charge related to this service. The patient expressed understanding and agreed to proceed.   Other persons participating in the visit and their role in the encounter: Patient, MD  Patients location: Home Providers location: Clinic  CHIEF COMPLAINT: Clinical stage IIa ER/PR positive, HER-2 negative invasive carcinoma of the central portion of the right breast.  INTERVAL HISTORY: Patient agreed to video enabled telemedicine visit for further evaluation and discussion of her imaging results.  She currently feels well and is asymptomatic.  She is now fully recovered after being found to be COVID-19 positive and is no longer under quarantine.  She continues to be anxious.  She has no neurologic complaints.  She denies any fevers.  She has a good appetite and denies weight loss.  She denies any chest pain, shortness of breath, cough, or hemoptysis.  She denies any nausea, vomiting, constipation, or diarrhea.  She has no urinary complaints.  Patient feels at her baseline offers no specific complaints today.  REVIEW OF SYSTEMS:   Review of Systems  Constitutional: Negative.  Negative for fever, malaise/fatigue and weight loss.  Respiratory: Negative.  Negative for cough, hemoptysis and shortness of breath.   Cardiovascular: Negative.  Negative for  chest pain and leg swelling.  Gastrointestinal: Negative.  Negative for abdominal pain, diarrhea and nausea.  Genitourinary: Negative.  Negative for dysuria.  Musculoskeletal: Negative.  Negative for back pain and myalgias.  Skin: Negative.  Negative for rash.  Neurological: Negative.  Negative for focal weakness, weakness and headaches.  Psychiatric/Behavioral: The patient is nervous/anxious.     As per HPI. Otherwise, a complete review of systems is negative.  PAST MEDICAL HISTORY: Past Medical History:  Diagnosis Date   Allergy    Breast cancer, right ([place]) 2020   Currently taking hormonal chemo tx's.    Diabetes mellitus without complication ([place]) 4462   Hyperlipidemia    Hypertension    Osteoporosis    Trigeminal neuralgia     PAST SURGICAL HISTORY: Past Surgical History:  Procedure Laterality Date   ABDOMINAL HYSTERECTOMY  1978   BREAST BIOPSY Right 01/06/2019   [place] bx x 2.  Ribbon marker for mass and hydro for Lymph node. Path pending   BREAST CYST EXCISION Right 1986   BREAST EXCISIONAL BIOPSY     LAMINECTOMY  1995   L1, L2, L3   SPINE SURGERY      FAMILY HISTORY: Family History  Adopted: Yes  Family history unknown: Yes    ADVANCED DIRECTIVES (Y/N):  N  HEALTH MAINTENANCE: Social History   Tobacco Use   Smoking status: Former Smoker    Quit date: 03/16/2000    Years since quitting: 19.0   Smokeless tobacco: Never Used  Substance Use Topics   Alcohol use: No   Drug use: No     Colonoscopy:  PAP:  Bone density:  Lipid panel:  Allergies  Allergen  Reactions   Morphine     Anaphallaxis.    Current Outpatient Medications  Medication Sig Dispense Refill   ALPRAZolam (XANAX) 1 MG tablet TAKE ONE TABLET BY MOUTH AT BEDTIME AS NEEDED FOR ANXIETY (Patient not taking: Reported on 02/15/2019) 90 tablet 1   amLODipine (NORVASC) 5 MG tablet Take 1 tablet (5 mg total) by mouth daily. 90 tablet 3   baclofen (LIORESAL) 10 MG tablet Take  1 tablet (10 mg total) by mouth daily. 90 tablet 3   bisoprolol-hydrochlorothiazide (ZIAC) 10-6.25 MG tablet Take 1 tablet by mouth 2 (two) times daily. 180 tablet 3   buPROPion (WELLBUTRIN XL) 150 MG 24 hr tablet Take 150 mg by mouth 2 (two) times a day.     Cholecalciferol (VITAMIN D3) 5000 units CAPS Take by mouth.     letrozole (FEMARA) 2.5 MG tablet Take 1 tablet (2.5 mg total) by mouth daily. 90 tablet 3   lisinopril-hydrochlorothiazide (PRINZIDE,ZESTORETIC) 10-12.5 MG tablet Take 1 tablet by mouth daily. 90 tablet 3   loratadine (CLARITIN) 10 MG tablet Take 1 tablet (10 mg total) by mouth daily as needed. 90 tablet 3   meloxicam (MOBIC) 15 MG tablet Take 1 tablet (15 mg total) by mouth daily. 90 tablet 3   metFORMIN (GLUCOPHAGE) 1000 MG tablet Take 1 tablet (1,000 mg total) by mouth 2 (two) times daily with a meal. 180 tablet 3   MULTIPLE VITAMINS PO MULTIVITAMINS (Oral Tablet)  1 Every Day for 0 days  Quantity: 0.00;  Refills: 0   Ordered :17-Apr-2011  Nichole Martinez ;  Started 24-February-2009 Active     OMEGA 3-6-9 FATTY ACIDS PO Reported on 10/04/2015     predniSONE (DELTASONE) 20 MG tablet Take 1 tablet (20 mg total) by mouth daily with breakfast. 6 tablet 0   simvastatin (ZOCOR) 20 MG tablet Take 1 tablet (20 mg total) by mouth at bedtime. 90 tablet 3   sitaGLIPtin (JANUVIA) 100 MG tablet Take 1 tablet (100 mg total) by mouth daily. 90 tablet 3   terbinafine (LAMISIL) 250 MG tablet Take 1 tablet (250 mg total) by mouth daily. 90 tablet 0   Vitamin D, Ergocalciferol, (DRISDOL) 50000 units CAPS capsule Take 1 capsule (50,000 Units total) by mouth every 7 (seven) days. 12 capsule 4   vitamin E 1000 UNIT capsule Take 1,000 Units by mouth daily.     No current facility-administered medications for this visit.     OBJECTIVE: There were no vitals filed for this visit.   There is no height or weight on file to calculate BMI.    ECOG FS:0 - Asymptomatic  General: Well-developed,  well-nourished, no acute distress. HEENT: Normocephalic. Neuro: Alert, answering all questions appropriately. Cranial nerves grossly intact. Skin: No rashes or petechiae noted. Psych: Normal affect.  LAB RESULTS:  Lab Results  Component Value Date   NA 137 02/03/2019   K 4.3 02/03/2019   CL 104 02/03/2019   CO2 25 02/03/2019   GLUCOSE 145 (H) 02/03/2019   BUN 28 (H) 02/03/2019   CREATININE 0.70 03/17/2019   CALCIUM 9.2 02/03/2019   PROT 7.0 02/03/2019   ALBUMIN 4.0 02/03/2019   AST 18 02/03/2019   ALT 16 02/03/2019   ALKPHOS 59 02/03/2019   BILITOT 0.6 02/03/2019   GFRNONAA >60 02/03/2019   GFRAA >60 02/03/2019    Lab Results  Component Value Date   WBC 7.7 05/07/2018   NEUTROABS 3.9 05/07/2018   HGB 13.8 05/07/2018   HCT 42.8 05/07/2018  MCV 82 05/07/2018   PLT 252 05/07/2018     STUDIES: Ct Chest W Contrast  Result Date: 03/17/2019 CLINICAL DATA:  Right breast cancer with hormonal chemotherapy. EXAM: CT CHEST WITH CONTRAST TECHNIQUE: Multidetector CT imaging of the chest was performed during intravenous contrast administration. CONTRAST:  39m OMNIPAQUE IOHEXOL 300 MG/ML  SOLN COMPARISON:  02/03/2019. FINDINGS: Cardiovascular: Atherosclerotic calcification of the aorta and coronary arteries. Pulmonic trunk and heart are enlarged. Small amount of pericardial fluid is likely physiologic. Mediastinum/Nodes: Left lobe of the thyroid is asymmetrically enlarged. Dominant left thyroid nodule measures 3.2 cm, as before, with mass effect on the trachea. No pathologically enlarged mediastinal, hilar or left axillary lymph nodes. Right axillary lymph node measures 11 mm, decreased from 1.5 cm. No internal mammary adenopathy. Esophagus is grossly unremarkable. Lungs/Pleura: 4 mm right upper lobe nodule (series 3, image 34), stable. Previously seen area of nodular consolidation in the left lower lobe has resolved. There is, however, new mid and lower lung zone peribronchovascular and  peripheral ground-glass. No pleural fluid. Slight mass effect and compression on the trachea, secondary to left thyroid enlargement. Upper Abdomen: Visualized portions of the liver, gallbladder and right adrenal gland are unremarkable. Slight nodularity of the left adrenal gland. Visualized portion of the right kidney is unremarkable. Mild scarring in the left kidney. Spleen and visualized portions of the pancreas, stomach and bowel are unremarkable. Upper abdominal lymph nodes are not enlarged by CT size criteria. Musculoskeletal: Right breast mass has decreased in size slightly in the interval, now measuring 1.7 x 2.4 cm compared to 2.5 x 3.0 cm. Degenerative changes in the spine. No worrisome lytic or sclerotic lesions. IMPRESSION: 1. Decrease in size of right breast mass and right axillary lymph node. No evidence of distant metastatic disease. 2. New mid and lower lung zone peribronchovascular and peripheral ground-glass. There is a spectrum of findings in the lungs which can be seen with acute atypical infection (as well as other non-infectious etiologies). In particular, viral pneumonia (including COVID-19) should be considered in the appropriate clinical setting. These results will be called to the ordering clinician or representative by the Radiologist Assistant, and communication documented in the PACS or zVision Dashboard. 3. Asymmetrically enlarged left lobe of the thyroid with a dominant left thyroid nodule, as before. Associated mass effect and compression of the trachea. Consider further evaluation with thyroid ultrasound. If patient is clinically hyperthyroid, consider nuclear medicine thyroid uptake and scan. 4. Aortic atherosclerosis (ICD10-170.0). Coronary artery calcification. 5. Enlarged pulmonic trunk, indicative of pulmonary arterial hypertension. Electronically Signed   By: MLorin PicketM.D.   On: 03/17/2019 10:52    ASSESSMENT: Clinical stage IIa ER/PR positive, HER-2 negative invasive  carcinoma of the central portion of the right breast.  Oncotype DX score 7.  PLAN:   1. Clinical stage IIa ER/PR positive, HER-2 negative invasive carcinoma of the central portion of the right breast: Although patient is clinical stage IIa and typically neoadjuvant chemotherapy is the recommendation, patient does not wish to pursue aggressive immunosuppressive treatment during the COVID-19 pandemic.  Because of this, it was agreed upon to initiate neoadjuvant letrozole.  Patient expressed understanding that she will more than likely require chemotherapy in the future despite her Oncotype DX score.  Repeat CT scan results from March 17, 2019 reviewed independently and reported as above with decrease in patient's known breast mass as well as no evidence of metastatic disease.  Patient was given a referral back to surgery for reconsideration of lumpectomy.  The groundglass nodules noted in bilateral lungs are likely residual inflammation from her recent COVID-19 infection.  No intervention is needed at this time.  Patient has been instructed to keep her previously scheduled follow-up visit in August 2020.    2.  COVID positivity: Resolved.  Patient reports repeat testing is negative.  She is no longer under quarantine.  It is likely that the groundglass densities seen on her CT scan from March 17, 2019 are residual from her infection.  No intervention is needed at this time. 3.  Nausea and diarrhea: Resolved.   4.  Thyroid nodule: Stable.  Patient was given a referral to ENT for further evaluation.  I provided 25 minutes of face-to-face video visit time during this encounter, and > 50% was spent counseling as documented under my assessment & plan.   Patient expressed understanding and was in agreement with this plan. She also understands that She can call clinic at any time with any questions, concerns, or complaints.   Cancer Staging Cancer of central portion of right female breast [facility]) Staging form:  Breast, AJCC 8th Edition - Clinical stage from 01/13/2019: Stage IIA (cT2, cN1, cM0, G1, ER+, PR+, HER2-) - Signed by Lloyd Huger, MD on 01/13/2019   Lloyd Huger, MD   03/20/2019 8:14 AM

## 2019-03-17 ENCOUNTER — Other Ambulatory Visit: Payer: Self-pay

## 2019-03-17 ENCOUNTER — Ambulatory Visit
Admission: RE | Admit: 2019-03-17 | Discharge: 2019-03-17 | Disposition: A | Discharge status: Home / Self Care | Payer: Medicare Other | Source: Ambulatory Visit | Attending: Oncology | Admitting: Oncology

## 2019-03-17 ENCOUNTER — Telehealth: Payer: Self-pay | Admitting: Oncology

## 2019-03-17 ENCOUNTER — Telehealth: Payer: Self-pay | Admitting: *Deleted

## 2019-03-17 DIAGNOSIS — C50111 Malignant neoplasm of central portion of right female breast: Secondary | ICD-10-CM

## 2019-03-17 DIAGNOSIS — C50911 Malignant neoplasm of unspecified site of right female breast: Secondary | ICD-10-CM | POA: Diagnosis not present

## 2019-03-17 DIAGNOSIS — R918 Other nonspecific abnormal finding of lung field: Secondary | ICD-10-CM

## 2019-03-17 DIAGNOSIS — Z170 Estrogen receptor positive status [ER+]: Secondary | ICD-10-CM | POA: Insufficient documentation

## 2019-03-17 DIAGNOSIS — J181 Lobar pneumonia, unspecified organism: Secondary | ICD-10-CM | POA: Diagnosis not present

## 2019-03-17 LAB — POCT I-STAT CREATININE: Creatinine, Ser: 0.7 mg/dL (ref 0.44–1.00)

## 2019-03-17 MED ORDER — IOHEXOL 300 MG/ML  SOLN
75.0000 mL | Freq: Once | INTRAMUSCULAR | Status: AC | PRN
  Administered 2019-03-17: 75 mL via INTRAVENOUS

## 2019-03-17 NOTE — Telephone Encounter (Signed)
Called Report  IMPRESSION: 1. Decrease in size of right breast mass and right axillary lymph node. No evidence of distant metastatic disease. 2. New mid and lower lung zone peribronchovascular and peripheral ground-glass. There is a spectrum of findings in the lungs which can be seen with acute atypical infection (as well as other non-infectious etiologies). In particular, viral pneumonia (including COVID-19) should be considered in the appropriate clinical setting. These results will be called to the ordering clinician or representative by the Radiologist Assistant, and communication documented in the PACS or zVision Dashboard. 3. Asymmetrically enlarged left lobe of the thyroid with a dominant left thyroid nodule, as before. Associated mass effect and compression of the trachea. Consider further evaluation with thyroid ultrasound. If patient is clinically hyperthyroid, consider nuclear medicine thyroid uptake and scan. 4. Aortic atherosclerosis (ICD10-170.0). Coronary artery calcification. 5. Enlarged pulmonic trunk, indicative of pulmonary arterial hypertension.   Electronically Signed   By: Lorin Picket M.D.   On: 03/17/2019 10:52

## 2019-03-18 ENCOUNTER — Inpatient Hospital Stay: Payer: Medicare Other | Attending: Oncology | Admitting: Oncology

## 2019-03-18 DIAGNOSIS — C50111 Malignant neoplasm of central portion of right female breast: Secondary | ICD-10-CM | POA: Diagnosis not present

## 2019-03-18 DIAGNOSIS — Z170 Estrogen receptor positive status [ER+]: Secondary | ICD-10-CM | POA: Diagnosis not present

## 2019-03-18 DIAGNOSIS — Z87891 Personal history of nicotine dependence: Secondary | ICD-10-CM

## 2019-03-26 NOTE — Progress Notes (Signed)
Patient: Nichole Martinez Female    DOB: [birth year]   68 y.o.   MRN: [id number] Visit Date: 03/29/2019  Today's Provider: [name], PA-C   Chief Complaint  Patient presents with  . Follow-up    HTN and DM   Subjective:     HPI   Diabetes Mellitus Type II, Follow-up:   Lab Results  Component Value Date   HGBA1C 6.0 (A) 03/29/2019   HGBA1C 6.5 (H) 05/07/2018   HGBA1C 6.4 (H) 05/06/2017    Last seen for diabetes 1 years ago.  Management since then includes none. She reports excellent compliance with treatment. She is not having side effects.  Current symptoms include none and have been stable. Home blood sugar records: 110-155 Januvia is expensive and she no longer can get the discount.  Episodes of hypoglycemia? no   Current insulin regiment: Is not on insulin Most Recent Eye Exam: 06/18 Current exercise: stationary bike Current diet habits: in general, a "healthy" diet    Pertinent Labs:    Component Value Date/Time   CHOL 193 05/07/2018 0952   TRIG 161 (H) 05/07/2018 0952   HDL 60 05/07/2018 0952   LDLCALC 101 (H) 05/07/2018 0952   CREATININE 0.70 03/17/2019 0958    Wt Readings from Last 3 Encounters:  03/29/19 229 lb 6.4 oz (104.1 kg)  01/12/19 235 lb (106.6 kg)  12/28/18 235 lb 6.4 oz (106.8 kg)    ------------------------------------------------------------------------ HTN: Stable.  BP Readings from Last 3 Encounters:  03/29/19 105/72  01/13/19 132/84  01/12/19 127/79         Allergies  Allergen Reactions  . Morphine     Anaphallaxis.     Current Outpatient Medications:  .  ALPRAZolam (XANAX) 1 MG tablet, TAKE ONE TABLET BY MOUTH AT BEDTIME AS NEEDED FOR ANXIETY, Disp: 90 tablet, Rfl: 1 .  amLODipine (NORVASC) 5 MG tablet, Take 1 tablet (5 mg total) by mouth daily., Disp: 90 tablet, Rfl: 3 .  baclofen (LIORESAL) 10 MG tablet, Take 1 tablet (10 mg total) by mouth daily., Disp: 90 tablet, Rfl: 3 .   bisoprolol-hydrochlorothiazide (ZIAC) 10-6.25 MG tablet, Take 1 tablet by mouth 2 (two) times daily., Disp: 180 tablet, Rfl: 3 .  buPROPion (WELLBUTRIN XL) 150 MG 24 hr tablet, Take 150 mg by mouth 2 (two) times a day., Disp: , Rfl:  .  Cholecalciferol (VITAMIN D3) 5000 units CAPS, Take by mouth., Disp: , Rfl:  .  letrozole (FEMARA) 2.5 MG tablet, Take 1 tablet (2.5 mg total) by mouth daily., Disp: 90 tablet, Rfl: 3 .  lisinopril-hydrochlorothiazide (PRINZIDE,ZESTORETIC) 10-12.5 MG tablet, Take 1 tablet by mouth daily., Disp: 90 tablet, Rfl: 3 .  loratadine (CLARITIN) 10 MG tablet, Take 1 tablet (10 mg total) by mouth daily as needed., Disp: 90 tablet, Rfl: 3 .  meloxicam (MOBIC) 15 MG tablet, Take 1 tablet (15 mg total) by mouth daily., Disp: 90 tablet, Rfl: 3 .  metFORMIN (GLUCOPHAGE) 1000 MG tablet, Take 1 tablet (1,000 mg total) by mouth 2 (two) times daily with a meal., Disp: 180 tablet, Rfl: 3 .  MULTIPLE VITAMINS PO, MULTIVITAMINS (Oral Tablet)  1 Every Day for 0 days  Quantity: 0.00;  Refills: 0   Ordered :17-Apr-2011  [name] ;  Started 24-February-2009 Active, Disp: , Rfl:  .  OMEGA 3-6-9 FATTY ACIDS PO, Reported on 10/04/2015, Disp: , Rfl:  .  predniSONE (DELTASONE) 20 MG tablet, Take 1 tablet (20 mg total) by  mouth daily with breakfast., Disp: 6 tablet, Rfl: 0 .  simvastatin (ZOCOR) 20 MG tablet, Take 1 tablet (20 mg total) by mouth at bedtime., Disp: 90 tablet, Rfl: 3 .  sitaGLIPtin (JANUVIA) 100 MG tablet, Take 1 tablet (100 mg total) by mouth daily., Disp: 90 tablet, Rfl: 3 .  terbinafine (LAMISIL) 250 MG tablet, Take 1 tablet (250 mg total) by mouth daily., Disp: 90 tablet, Rfl: 0 .  Vitamin D, Ergocalciferol, (DRISDOL) 50000 units CAPS capsule, Take 1 capsule (50,000 Units total) by mouth every 7 (seven) days., Disp: 12 capsule, Rfl: 4 .  vitamin E 1000 UNIT capsule, Take 1,000 Units by mouth daily., Disp: , Rfl:   Review of Systems  Constitutional: Negative.   HENT: Negative.    Respiratory: Negative.   Cardiovascular: Negative.   Gastrointestinal: Negative.   Neurological: Negative.   Psychiatric/Behavioral: Negative.     Social History   Tobacco Use  . Smoking status: Former Smoker    Quit date: 03/16/2000    Years since quitting: 19.0  . Smokeless tobacco: Never Used  Substance Use Topics  . Alcohol use: No      Objective:   BP 105/72 (BP Location: Right Arm, Patient Position: Sitting, Cuff Size: Large)   Pulse 63   Temp 98.7 F (37.1 C) (Oral)   Resp 16   Wt 229 lb 6.4 oz (104.1 kg)   BMI 39.38 kg/m  Vitals:   03/29/19 0824  BP: 105/72  Pulse: 63  Resp: 16  Temp: 98.7 F (37.1 C)  TempSrc: Oral  Weight: 229 lb 6.4 oz (104.1 kg)     Physical Exam Vitals signs reviewed.  Constitutional:      General: She is not in acute distress.    Appearance: Normal appearance. She is well-developed. She is obese. She is not ill-appearing or diaphoretic.  Eyes:     General: No scleral icterus.    Extraocular Movements: Extraocular movements intact.     Pupils: Pupils are equal, round, and reactive to light.  Neck:     Musculoskeletal: Normal range of motion and neck supple.     Thyroid: No thyromegaly.     Vascular: No carotid bruit or JVD.     Trachea: No tracheal deviation.  Cardiovascular:     Rate and Rhythm: Normal rate and regular rhythm.     Pulses: Normal pulses.     Heart sounds: Normal heart sounds. No murmur. No friction rub. No gallop.   Pulmonary:     Effort: Pulmonary effort is normal. No respiratory distress.     Breath sounds: Normal breath sounds. No wheezing or rales.  Musculoskeletal:     Right lower leg: No edema.     Left lower leg: No edema.  Lymphadenopathy:     Cervical: No cervical adenopathy.  Skin:    Capillary Refill: Capillary refill takes less than 2 seconds.  Neurological:     General: No focal deficit present.     Mental Status: She is alert and oriented to person, place, and time. Mental status is at  baseline.     Cranial Nerves: No cranial nerve deficit.     Motor: No weakness.     Coordination: Coordination normal.     Gait: Gait normal.  Psychiatric:        Mood and Affect: Mood normal.        Behavior: Behavior normal.        Thought Content: Thought content normal.  Judgment: Judgment normal.    Diabetic Foot Exam - Simple   Simple Foot Form Diabetic Foot exam was performed with the following findings: Yes 03/29/2019 12:26 PM  Visual Inspection No deformities, no ulcerations, no other skin breakdown bilaterally: Yes Sensation Testing Intact to touch and monofilament testing bilaterally: Yes Pulse Check Posterior Tibialis and Dorsalis pulse intact bilaterally: Yes Comments      Results for orders placed or performed in visit on 03/29/19  POCT glycosylated hemoglobin (Hb A1C)  Result Value Ref Range   Hemoglobin A1C 6.0 (A) 4.0 - 5.6 %   Est. average glucose Bld gHb Est-mCnc 126        Assessment & Plan    1. Type 2 diabetes mellitus without complication, without long-term current use of insulin (HCC) A1c improved to 6.0 from 6.5. Will stop Januvia for now due to better control with lifestyle and cost of medication. Continue metformin as below. Will check labs as below and f/u pending results. - metFORMIN (GLUCOPHAGE) 1000 MG tablet; Take 1 tablet (1,000 mg total) by mouth 2 (two) times daily with a meal.  Dispense: 180 tablet; Refill: 3 - CBC w/Diff/Platelet - Comprehensive Metabolic Panel (CMET) - TSH - Lipid Profile  2. Essential hypertension Stable. Diagnosis pulled for medication refill. Continue current medical treatment plan. Will check labs as below and f/u pending results. - amLODipine (NORVASC) 5 MG tablet; Take 1 tablet (5 mg total) by mouth daily.  Dispense: 90 tablet; Refill: 3 - bisoprolol-hydrochlorothiazide (ZIAC) 10-6.25 MG tablet; Take 1 tablet by mouth 2 (two) times daily.  Dispense: 180 tablet; Refill: 3 - lisinopril-hydrochlorothiazide  (ZESTORETIC) 10-12.5 MG tablet; Take 1 tablet by mouth daily.  Dispense: 90 tablet; Refill: 3 - CBC w/Diff/Platelet - Comprehensive Metabolic Panel (CMET) - TSH - Lipid Profile  3. Class 2 severe obesity due to excess calories with serious comorbidity and body mass index (BMI) of 39.0 to 39.9 in adult [facility]) Counseled patient on healthy lifestyle modifications including dieting and exercise.  - CBC w/Diff/Platelet - Comprehensive Metabolic Panel (CMET) - TSH - Lipid Profile  4. Adjustment disorder with anxious mood Stable. Diagnosis pulled for medication refill. Continue current medical treatment plan. Will check labs as below and f/u pending results. - ALPRAZolam (XANAX) 1 MG tablet; TAKE ONE TABLET BY MOUTH AT BEDTIME AS NEEDED FOR ANXIETY  Dispense: 90 tablet; Refill: 1 - CBC w/Diff/Platelet - Comprehensive Metabolic Panel (CMET) - TSH - Lipid Profile  5. Osteoarthritis of knee, unspecified laterality, unspecified osteoarthritis type Stable. Diagnosis pulled for medication refill. Continue current medical treatment plan.  - baclofen (LIORESAL) 10 MG tablet; Take 1 tablet (10 mg total) by mouth daily.  Dispense: 90 tablet; Refill: 3 - meloxicam (MOBIC) 15 MG tablet; Take 1 tablet (15 mg total) by mouth daily.  Dispense: 90 tablet; Refill: 3  6. Pure hypercholesterolemia Stable. Diagnosis pulled for medication refill. Continue current medical treatment plan. Will check labs as below and f/u pending results. - simvastatin (ZOCOR) 20 MG tablet; Take 1 tablet (20 mg total) by mouth at bedtime.  Dispense: 90 tablet; Refill: 3 - CBC w/Diff/Platelet - Comprehensive Metabolic Panel (CMET) - TSH - Lipid Profile  7. Avitaminosis D Stable. Diagnosis pulled for medication refill. Continue current medical treatment plan. Will check labs as below and f/u pending results. - Vitamin D, Ergocalciferol, (DRISDOL) 1.25 MG (50000 UT) CAPS capsule; Take 1 capsule (50,000 Units total) by mouth every 7  (seven) days.  Dispense: 12 capsule; Refill: 4 - CBC w/Diff/Platelet -  Comprehensive Metabolic Panel (CMET) - TSH - Lipid Profile  Other orders - loratadine (CLARITIN) 10 MG tablet; Take 1 tablet (10 mg total) by mouth daily as needed.  Dispense: 90 tablet; Refill: [place], PA-C  [facility]

## 2019-03-29 ENCOUNTER — Ambulatory Visit (INDEPENDENT_AMBULATORY_CARE_PROVIDER_SITE_OTHER): Payer: Medicare Other | Admitting: Physician Assistant

## 2019-03-29 ENCOUNTER — Other Ambulatory Visit: Payer: Self-pay

## 2019-03-29 ENCOUNTER — Encounter: Payer: Self-pay | Admitting: Physician Assistant

## 2019-03-29 VITALS — BP 105/72 | HR 63 | Temp 98.7°F | Resp 16 | Wt 229.4 lb

## 2019-03-29 DIAGNOSIS — F4322 Adjustment disorder with anxiety: Secondary | ICD-10-CM

## 2019-03-29 DIAGNOSIS — M1710 Unilateral primary osteoarthritis, unspecified knee: Secondary | ICD-10-CM | POA: Diagnosis not present

## 2019-03-29 DIAGNOSIS — E119 Type 2 diabetes mellitus without complications: Secondary | ICD-10-CM

## 2019-03-29 DIAGNOSIS — E559 Vitamin D deficiency, unspecified: Secondary | ICD-10-CM

## 2019-03-29 DIAGNOSIS — E7800 Pure hypercholesterolemia, unspecified: Secondary | ICD-10-CM | POA: Diagnosis not present

## 2019-03-29 DIAGNOSIS — Z6839 Body mass index (BMI) 39.0-39.9, adult: Secondary | ICD-10-CM

## 2019-03-29 DIAGNOSIS — M179 Osteoarthritis of knee, unspecified: Secondary | ICD-10-CM

## 2019-03-29 DIAGNOSIS — I10 Essential (primary) hypertension: Secondary | ICD-10-CM | POA: Diagnosis not present

## 2019-03-29 LAB — POCT GLYCOSYLATED HEMOGLOBIN (HGB A1C)
Est. average glucose Bld gHb Est-mCnc: 126
Hemoglobin A1C: 6 % — AB (ref 4.0–5.6)

## 2019-03-29 MED ORDER — LORATADINE 10 MG PO TABS: 10.0000 mg | ORAL_TABLET | Freq: Every day | ORAL | 3 refills | Status: DC | PRN

## 2019-03-29 MED ORDER — MELOXICAM 15 MG PO TABS: 15.0000 mg | ORAL_TABLET | Freq: Every day | ORAL | 3 refills | Status: DC

## 2019-03-29 MED ORDER — BISOPROLOL-HYDROCHLOROTHIAZIDE 10-6.25 MG PO TABS: 1.0000 | ORAL_TABLET | Freq: Two times a day (BID) | ORAL | 3 refills | Status: DC

## 2019-03-29 MED ORDER — AMLODIPINE BESYLATE 5 MG PO TABS: 5.0000 mg | ORAL_TABLET | Freq: Every day | ORAL | 3 refills | Status: DC

## 2019-03-29 MED ORDER — SIMVASTATIN 20 MG PO TABS: 20.0000 mg | ORAL_TABLET | Freq: Every day | ORAL | 3 refills | Status: DC

## 2019-03-29 MED ORDER — METFORMIN HCL 1000 MG PO TABS: 1000.0000 mg | ORAL_TABLET | Freq: Two times a day (BID) | ORAL | 3 refills | Status: DC

## 2019-03-29 MED ORDER — ALPRAZOLAM 1 MG PO TABS: ORAL_TABLET | ORAL | 1 refills | Status: DC

## 2019-03-29 MED ORDER — BACLOFEN 10 MG PO TABS: 10.0000 mg | ORAL_TABLET | Freq: Every day | ORAL | 3 refills | Status: DC

## 2019-03-29 MED ORDER — LISINOPRIL-HYDROCHLOROTHIAZIDE 10-12.5 MG PO TABS: 1.0000 | ORAL_TABLET | Freq: Every day | ORAL | 3 refills | Status: DC

## 2019-03-29 MED ORDER — VITAMIN D (ERGOCALCIFEROL) 1.25 MG (50000 UNIT) PO CAPS: 50000.0000 [IU] | ORAL_CAPSULE | ORAL | 4 refills | Status: DC

## 2019-03-29 NOTE — Patient Instructions (Signed)

## 2019-03-30 LAB — CBC WITH DIFFERENTIAL/PLATELET
Basophils Absolute: 0.1 10*3/uL (ref 0.0–0.2)
Basos: 1 %
EOS (ABSOLUTE): 1.6 10*3/uL — ABNORMAL HIGH (ref 0.0–0.4)
Eos: 18 %
Hematocrit: 43.3 % (ref 34.0–46.6)
Hemoglobin: 13.6 g/dL (ref 11.1–15.9)
Immature Grans (Abs): 0 10*3/uL (ref 0.0–0.1)
Immature Granulocytes: 0 %
Lymphocytes Absolute: 2.3 10*3/uL (ref 0.7–3.1)
Lymphs: 25 %
MCH: 26.1 pg — ABNORMAL LOW (ref 26.6–33.0)
MCHC: 31.4 g/dL — ABNORMAL LOW (ref 31.5–35.7)
MCV: 83 fL (ref 79–97)
Monocytes Absolute: 0.6 10*3/uL (ref 0.1–0.9)
Monocytes: 7 %
Neutrophils Absolute: 4.4 10*3/uL (ref 1.4–7.0)
Neutrophils: 49 %
Platelets: 257 10*3/uL (ref 150–450)
RBC: 5.21 x10E6/uL (ref 3.77–5.28)
RDW: 14.2 % (ref 11.7–15.4)
WBC: 8.9 10*3/uL (ref 3.4–10.8)

## 2019-03-30 LAB — COMPREHENSIVE METABOLIC PANEL
ALT: 13 IU/L (ref 0–32)
AST: 15 IU/L (ref 0–40)
Albumin/Globulin Ratio: 2.2 (ref 1.2–2.2)
Albumin: 4 g/dL (ref 3.8–4.8)
Alkaline Phosphatase: 48 IU/L (ref 39–117)
BUN/Creatinine Ratio: 22 (ref 12–28)
BUN: 21 mg/dL (ref 8–27)
Bilirubin Total: 0.3 mg/dL (ref 0.0–1.2)
CO2: 21 mmol/L (ref 20–29)
Calcium: 9.3 mg/dL (ref 8.7–10.3)
Chloride: 106 mmol/L (ref 96–106)
Creatinine, Ser: 0.96 mg/dL (ref 0.57–1.00)
GFR calc Af Amer: 72 mL/min/{1.73_m2} (ref >59)
GFR calc non Af Amer: 62 mL/min/{1.73_m2} (ref >59)
Globulin, Total: 1.8 g/dL (ref 1.5–4.5)
Glucose: 75 mg/dL (ref 65–99)
Potassium: 4.5 mmol/L (ref 3.5–5.2)
Sodium: 139 mmol/L (ref 134–144)
Total Protein: 5.8 g/dL — ABNORMAL LOW (ref 6.0–8.5)

## 2019-03-30 LAB — LIPID PANEL
Chol/HDL Ratio: 3.4 ratio (ref 0.0–4.4)
Cholesterol, Total: 160 mg/dL (ref 100–199)
HDL: 47 mg/dL (ref >39)
LDL Calculated: 56 mg/dL (ref 0–99)
Triglycerides: 286 mg/dL — ABNORMAL HIGH (ref 0–149)
VLDL Cholesterol Cal: 57 mg/dL — ABNORMAL HIGH (ref 5–40)

## 2019-03-30 LAB — TSH: TSH: 1.21 u[IU]/mL (ref 0.450–4.500)

## 2019-03-31 ENCOUNTER — Other Ambulatory Visit: Payer: Self-pay | Admitting: Physician Assistant

## 2019-03-31 DIAGNOSIS — G43109 Migraine with aura, not intractable, without status migrainosus: Secondary | ICD-10-CM

## 2019-04-02 ENCOUNTER — Encounter: Payer: Self-pay | Admitting: Oncology

## 2019-04-05 ENCOUNTER — Telehealth: Payer: Self-pay | Admitting: *Deleted

## 2019-04-05 NOTE — Telephone Encounter (Signed)
I can't do that.  She's only getting letrozole.

## 2019-04-05 NOTE — Telephone Encounter (Signed)
Patient called asking for a letter for [employer] stating she is under treatment for cancer so they will refund her money for a trip she had planned and she would like  [employer] call when it is ready

## 2019-04-05 NOTE — Telephone Encounter (Signed)
Dr. Grayland Ormond, can you please advise. Patient is not currently on any treatment. Thanks.

## 2019-04-06 NOTE — Telephone Encounter (Signed)
Patient informed that we cannot write requested letter.

## 2019-04-08 ENCOUNTER — Other Ambulatory Visit: Payer: Self-pay | Admitting: Otolaryngology

## 2019-04-08 DIAGNOSIS — E041 Nontoxic single thyroid nodule: Secondary | ICD-10-CM

## 2019-04-12 ENCOUNTER — Other Ambulatory Visit: Payer: Self-pay

## 2019-04-12 MED ORDER — BUPROPION HCL ER (XL) 150 MG PO TB24: 150.0000 mg | ORAL_TABLET | Freq: Two times a day (BID) | ORAL | 1 refills | Status: DC

## 2019-04-15 ENCOUNTER — Ambulatory Visit
Admission: RE | Admit: 2019-04-15 | Discharge: 2019-04-15 | Disposition: A | Discharge status: Home / Self Care | Payer: Medicare Other | Source: Ambulatory Visit | Attending: Otolaryngology | Admitting: Otolaryngology

## 2019-04-15 ENCOUNTER — Other Ambulatory Visit: Payer: Self-pay

## 2019-04-15 DIAGNOSIS — E041 Nontoxic single thyroid nodule: Secondary | ICD-10-CM | POA: Diagnosis not present

## 2019-04-16 ENCOUNTER — Other Ambulatory Visit: Payer: Self-pay

## 2019-04-18 NOTE — Progress Notes (Signed)
[place]  [phone] [phone]  ID: Nichole Martinez OB: 29-Nov-1950  MR#: [id number]  TFT#:[id number]  Patient Care Team: [name] as PCP - General (Family Medicine)  I connected with Nichole Martinez on 04/19/19 at 10:30 AM EDT by video enabled telemedicine visit and verified that I am speaking with the correct person using two identifiers.   I discussed the limitations, risks, security and privacy concerns of performing an evaluation and management service by telemedicine and the availability of in-person appointments. I also discussed with the patient that there may be a patient responsible charge related to this service. The patient expressed understanding and agreed to proceed.   Other persons participating in the visit and their role in the encounter: Patient, MD  Patients location: Home Providers location: Clinic  CHIEF COMPLAINT: Clinical stage IIa ER/PR positive, HER-2 negative invasive carcinoma of the central portion of the right breast.  INTERVAL HISTORY: Patient agreed to video enabled telemedicine visit for further evaluation.  She continues to tolerate letrozole well without significant side effects.  She currently feels well and is asymptomatic.  She continues to be anxious.  She has no neurologic complaints.  She denies any fevers.  She has a good appetite and denies weight loss.  She denies any chest pain, shortness of breath, cough, or hemoptysis.  She denies any nausea, vomiting, constipation, or diarrhea.  She has no urinary complaints.  Patient feels at her baseline offers no specific complaints today.  REVIEW OF SYSTEMS:   Review of Systems  Constitutional: Negative.  Negative for fever, malaise/fatigue and weight loss.  Respiratory: Negative.  Negative for cough, hemoptysis and shortness of breath.   Cardiovascular: Negative.  Negative for chest pain and leg swelling.  Gastrointestinal: Negative.   Negative for abdominal pain, diarrhea and nausea.  Genitourinary: Negative.  Negative for dysuria.  Musculoskeletal: Negative.  Negative for back pain and myalgias.  Skin: Negative.  Negative for rash.  Neurological: Negative.  Negative for focal weakness, weakness and headaches.  Psychiatric/Behavioral: The patient is nervous/anxious.     As per HPI. Otherwise, a complete review of systems is negative.  PAST MEDICAL HISTORY: Past Medical History:  Diagnosis Date   Allergy    Breast cancer, right ([place]) 2020   Currently taking hormonal chemo tx's.    Diabetes mellitus without complication ([place]) 7062   Hyperlipidemia    Hypertension    Osteoporosis    Trigeminal neuralgia     PAST SURGICAL HISTORY: Past Surgical History:  Procedure Laterality Date   ABDOMINAL HYSTERECTOMY  1978   BREAST BIOPSY Right 01/06/2019   [place] bx x 2.  Ribbon marker for mass and hydro for Lymph node. Path pending   BREAST CYST EXCISION Right 1986   BREAST EXCISIONAL BIOPSY     LAMINECTOMY  1995   L1, L2, L3   SPINE SURGERY      FAMILY HISTORY: Family History  Adopted: Yes  Family history unknown: Yes    ADVANCED DIRECTIVES (Y/N):  N  HEALTH MAINTENANCE: Social History   Tobacco Use   Smoking status: Former Smoker    Quit date: 03/16/2000    Years since quitting: 19.1   Smokeless tobacco: Never Used  Substance Use Topics   Alcohol use: No   Drug use: No     Colonoscopy:  PAP:  Bone density:  Lipid panel:  Allergies  Allergen Reactions   Morphine     Anaphallaxis.    Current Outpatient Medications  Medication Sig Dispense Refill   ALPRAZolam (XANAX) 1 MG tablet TAKE ONE TABLET BY MOUTH AT BEDTIME AS NEEDED FOR ANXIETY 90 tablet 1   amLODipine (NORVASC) 5 MG tablet Take 1 tablet (5 mg total) by mouth daily. 90 tablet 3   baclofen (LIORESAL) 10 MG tablet Take 1 tablet (10 mg total) by mouth daily. 90 tablet 3   bisoprolol-hydrochlorothiazide (ZIAC) 10-6.25 MG  tablet Take 1 tablet by mouth 2 (two) times daily. 180 tablet 3   buPROPion (WELLBUTRIN XL) 150 MG 24 hr tablet Take 1 tablet (150 mg total) by mouth 2 (two) times a day. 180 tablet 1   Cholecalciferol (VITAMIN D3) 5000 units CAPS Take by mouth.     letrozole (FEMARA) 2.5 MG tablet Take 1 tablet (2.5 mg total) by mouth daily. 90 tablet 3   lisinopril-hydrochlorothiazide (ZESTORETIC) 10-12.5 MG tablet Take 1 tablet by mouth daily. 90 tablet 3   loratadine (CLARITIN) 10 MG tablet Take 1 tablet (10 mg total) by mouth daily as needed. 90 tablet 3   meloxicam (MOBIC) 15 MG tablet Take 1 tablet (15 mg total) by mouth daily. 90 tablet 3   metFORMIN (GLUCOPHAGE) 1000 MG tablet Take 1 tablet (1,000 mg total) by mouth 2 (two) times daily with a meal. 180 tablet 3   MULTIPLE VITAMINS PO MULTIVITAMINS (Oral Tablet)  1 Every Day for 0 days  Quantity: 0.00;  Refills: 0   Ordered :17-Apr-2011  Nichole Martinez ;  Started 24-February-2009 Active     OMEGA 3-6-9 FATTY ACIDS PO Reported on 10/04/2015     simvastatin (ZOCOR) 20 MG tablet Take 1 tablet (20 mg total) by mouth at bedtime. 90 tablet 3   Vitamin D, Ergocalciferol, (DRISDOL) 1.25 MG (50000 UT) CAPS capsule Take 1 capsule (50,000 Units total) by mouth every 7 (seven) days. 12 capsule 4   vitamin E 1000 UNIT capsule Take 1,000 Units by mouth daily.     rizatriptan (MAXALT) 5 MG tablet TAKE 1 TABLET BY MOUTH AS NEEDED FOR MIGRAINE. MAY REPEAT IN 2 HOURS IF NEEDED. (Patient not taking: Reported on 04/19/2019) 10 tablet 0   No current facility-administered medications for this visit.     OBJECTIVE: There were no vitals filed for this visit.   There is no height or weight on file to calculate BMI.    ECOG FS:0 - Asymptomatic  General: Well-developed, well-nourished, no acute distress. HEENT: Normocephalic. Neuro: Alert, answering all questions appropriately. Cranial nerves grossly intact. Skin: No rashes or petechiae noted. Psych: Normal affect.  LAB  RESULTS:  Lab Results  Component Value Date   NA 139 03/29/2019   K 4.5 03/29/2019   CL 106 03/29/2019   CO2 21 03/29/2019   GLUCOSE 75 03/29/2019   BUN 21 03/29/2019   CREATININE 0.96 03/29/2019   CALCIUM 9.3 03/29/2019   PROT 5.8 (L) 03/29/2019   ALBUMIN 4.0 03/29/2019   AST 15 03/29/2019   ALT 13 03/29/2019   ALKPHOS 48 03/29/2019   BILITOT 0.3 03/29/2019   GFRNONAA 62 03/29/2019   GFRAA 72 03/29/2019    Lab Results  Component Value Date   WBC 8.9 03/29/2019   NEUTROABS 4.4 03/29/2019   HGB 13.6 03/29/2019   HCT 43.3 03/29/2019   MCV 83 03/29/2019   PLT 257 03/29/2019     STUDIES: US Thyroid  Result Date: 04/15/2019 CLINICAL DATA:  Palpable abnormality.  Palpable thyroid nodule. EXAM: THYROID ULTRASOUND TECHNIQUE: Ultrasound examination of the thyroid gland and adjacent soft tissues was performed.  COMPARISON:  None. FINDINGS: Parenchymal Echotexture: Moderately heterogenous Isthmus: Enlarged measuring 1 cm in diameter Right lobe: Borderline enlarged measuring 5.4 x 2.2 x 2.1 cm Left lobe: Enlarged measuring 6.3 x 3.1 x 3.6 cm. _________________________________________________________ Estimated total number of nodules >/= 1 cm: 1 Number of spongiform nodules >/=  2 cm not described below (TR1): 0 Number of mixed cystic and solid nodules >/= 1.5 cm not described below (TR2): 0 _________________________________________________________ There is an ill-defined approximately 1.2 x 1.1 x 0.9 cm isoechoic nodule/pseudonodule within the left-sided thyroid isthmus (labeled 1), which does not meet imaging criteria to recommend percutaneous sampling or continued dedicated follow-up. _________________________________________________________ There is an approximately 0.7 x 0.7 x 0.6 cm hypoechoic nodule within the superior pole of the right lobe of the thyroid (labeled 2), which does not meet imaging criteria to recommend percutaneous sampling or continued dedicated follow-up There is an  approximately 0.6 x 0.6 x 0.5 cm anechoic cyst within the mid aspect the right lobe of the thyroid (labeled 3), which contains a echogenic foci with ring down artifact compatible with benign colloid. This benign colloid containing cyst does not meet imaging criteria to recommend percutaneous sampling or continued dedicated follow-up There is an approximately 0.9 x 0.9 x 0.8 cm spongiform/benign-appearing nodule within the mid aspect the right lobe of the thyroid (labeled 4), which does not meet imaging criteria to recommend percutaneous sampling or continued dedicated follow-up. _________________________________________________________ Nodule # 5: Location: Right; Inferior Maximum size: 2.0 cm; Other 2 dimensions: 2.0 x 1.2 cm Composition: solid/almost completely solid (2) Echogenicity: isoechoic (1) Shape: not taller-than-wide (0) Margins: ill-defined (0) Echogenic foci: none (0) ACR TI-RADS total points: 3. ACR TI-RADS risk category: TR3 (3 points). ACR TI-RADS recommendations: **Given size (>/= 2.5 cm) and appearance, fine needle aspiration of this mildly suspicious nodule should be considered based on TI-RADS criteria. _________________________________________________________ IMPRESSION: 1. Borderline thyromegaly with findings suggestive of multinodular goiter. 2. Nodule #5 within the right lobe of the thyroid meets imaging criteria to recommend percutaneous sampling. 3. None of the remaining nodules/pseudo nodules meet imaging criteria to recommend percutaneous sampling or continued dedicated follow-up. The above is in keeping with the ACR TI-RADS recommendations - J Am Coll Radiol 2017;14:587-595. Electronically Signed   By: Sandi Mariscal M.D.   On: 04/15/2019 17:05    ASSESSMENT: Clinical stage IIa ER/PR positive, HER-2 negative invasive carcinoma of the central portion of the right breast.  Oncotype DX score 7.  PLAN:   1. Clinical stage IIa ER/PR positive, HER-2 negative invasive carcinoma of the  central portion of the right breast: Although patient is clinical stage IIa and typically neoadjuvant chemotherapy is the recommendation, patient did not wish to pursue aggressive immunosuppressive treatment during the COVID-19 pandemic.  Because of this, patient initiated neoadjuvant letrozole in approximately April 2020.  Patient expressed understanding that she will more than likely require chemotherapy in the future despite her Oncotype DX score.  Repeat CT scan results from March 17, 2019 reviewed independently with decrease in patient's known breast mass as well as no evidence of metastatic disease.  Patient has not heard from surgery to schedule lumpectomy, therefore a referral was sent for patient to be seen in the next 1 to 2 weeks.  Return to clinic in 4 weeks for further evaluation, but this appointment may be adjusted pending her lumpectomy.      2.  COVID positivity: Resolved.  Patient reports repeat testing is negative.  She is no longer under quarantine.  It is likely  that the groundglass densities seen on her CT scan from March 17, 2019 are residual from her infection.  No intervention is needed at this time. 3.  Nausea and diarrhea: Resolved.   4.  Thyroid nodule: Patient is seeing ENT and ultrasound has been completed.  She reports they plan to schedule a biopsy in the near future.  I provided 25 minutes of face-to-face video visit time during this encounter, and > 50% was spent counseling as documented under my assessment & plan.   Patient expressed understanding and was in agreement with this plan. She also understands that She can call clinic at any time with any questions, concerns, or complaints.   Cancer Staging Cancer of central portion of right female breast [facility]) Staging form: Breast, AJCC 8th Edition - Clinical stage from 01/13/2019: Stage IIA (cT2, cN1, cM0, G1, ER+, PR+, HER2-) - Signed by Lloyd Huger, MD on 01/13/2019   Lloyd Huger, MD   04/19/2019 3:18  PM

## 2019-04-19 ENCOUNTER — Inpatient Hospital Stay: Payer: Medicare Other | Attending: Oncology | Admitting: Oncology

## 2019-04-19 ENCOUNTER — Encounter: Payer: Self-pay | Admitting: Oncology

## 2019-04-19 DIAGNOSIS — C50111 Malignant neoplasm of central portion of right female breast: Secondary | ICD-10-CM | POA: Diagnosis not present

## 2019-04-19 DIAGNOSIS — Z5189 Encounter for other specified aftercare: Secondary | ICD-10-CM | POA: Insufficient documentation

## 2019-04-19 DIAGNOSIS — Z5111 Encounter for antineoplastic chemotherapy: Secondary | ICD-10-CM | POA: Insufficient documentation

## 2019-04-19 DIAGNOSIS — C773 Secondary and unspecified malignant neoplasm of axilla and upper limb lymph nodes: Secondary | ICD-10-CM | POA: Insufficient documentation

## 2019-04-19 DIAGNOSIS — I10 Essential (primary) hypertension: Secondary | ICD-10-CM | POA: Insufficient documentation

## 2019-04-19 DIAGNOSIS — Z170 Estrogen receptor positive status [ER+]: Secondary | ICD-10-CM | POA: Diagnosis not present

## 2019-04-19 DIAGNOSIS — Z87891 Personal history of nicotine dependence: Secondary | ICD-10-CM

## 2019-04-19 DIAGNOSIS — E119 Type 2 diabetes mellitus without complications: Secondary | ICD-10-CM | POA: Insufficient documentation

## 2019-04-19 DIAGNOSIS — Z79899 Other long term (current) drug therapy: Secondary | ICD-10-CM | POA: Insufficient documentation

## 2019-04-19 DIAGNOSIS — Z8619 Personal history of other infectious and parasitic diseases: Secondary | ICD-10-CM

## 2019-04-19 DIAGNOSIS — E041 Nontoxic single thyroid nodule: Secondary | ICD-10-CM | POA: Insufficient documentation

## 2019-04-19 DIAGNOSIS — M170 Bilateral primary osteoarthritis of knee: Secondary | ICD-10-CM | POA: Diagnosis not present

## 2019-04-19 NOTE — Progress Notes (Signed)
Patient stated that she had been doing well with no complaints. Patient wants to know her CT results.

## 2019-04-20 ENCOUNTER — Other Ambulatory Visit: Payer: Self-pay

## 2019-04-20 ENCOUNTER — Ambulatory Visit (INDEPENDENT_AMBULATORY_CARE_PROVIDER_SITE_OTHER): Payer: Medicare Other | Admitting: Surgery

## 2019-04-20 ENCOUNTER — Encounter: Payer: Self-pay | Admitting: Surgery

## 2019-04-20 VITALS — BP 161/79 | HR 55 | Temp 97.7°F | Resp 16 | Ht 65.0 in | Wt 226.6 lb

## 2019-04-20 DIAGNOSIS — C50311 Malignant neoplasm of lower-inner quadrant of right female breast: Secondary | ICD-10-CM | POA: Diagnosis not present

## 2019-04-20 DIAGNOSIS — Z170 Estrogen receptor positive status [ER+]: Secondary | ICD-10-CM

## 2019-04-20 NOTE — Progress Notes (Signed)
04/20/2019  History of Present Illness: Nichole Martinez is a 68 y.o. female with right breast cancer, seen previously by Dr. Bary Castilla.  She was initially diagnosed in 12/2018 when mammogram showed a 3.2 cm spiculated mass in the right breast at 12 o'clock, 2 cm from the nipple.  She had an ultrasound of the right axilla which showed an abnormal node measuring 16 mm and 4 other suspicious nodes and an additional abnormal node measuring 5 mm.  The breast mass and largest node were biopsied and confirmed positive for cancer, ER/PR positive, HER2 negative.  CT scan of the chest also noted thyroid nodules and she has recently undergone ultrasound of the neck which showed multinodular goiter, with one of 5 modes meeting criteria for biopsy.  She was seen by Dr. Bary Castilla and by Dr. Grayland Ormond.  From the notes, it appears that the patient did not want to pursue chemotherapy because she did not want to be immunosuppressed during the COVID-19 pandemic and was instead started on Letrozole hormonal therapy.  However, when talking with the patient, she reports that information is inaccurate and simply that chemotherapy was not her first or ideal choice but she would do it if necessary.  She has completed 3 months of Letrozole treatment and presents today for surgical evaluation.  She had a repeat CT scan last month which shows improvement in the size of the breast mass as well as the largest lymph node.  She can also feel the decrease in size of these.  Denies any issues with the Letrozole.  Of note, she did test positive for COVID-19 in late May and has since recovered.  Past Medical History: Past Medical History:  Diagnosis Date  . Allergy   . Breast cancer, right ([place]) 2020   Currently taking hormonal chemo tx's.   . Diabetes mellitus without complication ([place]) 2951  . Hyperlipidemia   . Hypertension   . Osteoporosis   . Trigeminal neuralgia      Past Surgical History: Past Surgical History:  Procedure  Laterality Date  . ABDOMINAL HYSTERECTOMY  1978  . BREAST BIOPSY Right 01/06/2019   [place] bx x 2.  Ribbon marker for mass and hydro for Lymph node. Path pending  . BREAST CYST EXCISION Right 1986  . BREAST EXCISIONAL BIOPSY    . LAMINECTOMY  1995   L1, L2, L3  . SPINE SURGERY      Home Medications: Prior to Admission medications   Medication Sig Start Date End Date Taking? Authorizing Provider  ALPRAZolam Duanne Moron) 1 MG tablet TAKE ONE TABLET BY MOUTH AT BEDTIME AS NEEDED FOR ANXIETY 03/29/19  Yes [name], PA-C  amLODipine (NORVASC) 5 MG tablet Take 1 tablet (5 mg total) by mouth daily. 03/29/19  Yes [name], PA-C  baclofen (LIORESAL) 10 MG tablet Take 1 tablet (10 mg total) by mouth daily. 03/29/19  Yes [name], PA-C  bisoprolol-hydrochlorothiazide (ZIAC) 10-6.25 MG tablet Take 1 tablet by mouth 2 (two) times daily. 03/29/19  Yes [name], PA-C  buPROPion (WELLBUTRIN XL) 150 MG 24 hr tablet Take 1 tablet (150 mg total) by mouth 2 (two) times a day. 04/12/19  Yes [name], PA-C  Cholecalciferol (VITAMIN D3) 5000 units CAPS Take by mouth.   Yes [provider]  letrozole (FEMARA) 2.5 MG tablet Take 1 tablet (2.5 mg total) by mouth daily. 01/13/19  Yes Lloyd Huger, MD  lisinopril-hydrochlorothiazide (ZESTORETIC) 10-12.5 MG tablet Take 1 tablet by mouth daily.  03/29/19  Yes [name], PA-C  loratadine (CLARITIN) 10 MG tablet Take 1 tablet (10 mg total) by mouth daily as needed. 03/29/19  Yes [name], PA-C  meloxicam (MOBIC) 15 MG tablet Take 1 tablet (15 mg total) by mouth daily. 03/29/19  Yes Fenton Malling M, PA-C  metFORMIN (GLUCOPHAGE) 1000 MG tablet Take 1 tablet (1,000 mg total) by mouth 2 (two) times daily with a meal. 03/29/19  Yes Burnette, Clearnce Sorrel, PA-C  MULTIPLE VITAMINS PO MULTIVITAMINS (Oral Tablet)  1 Every Day for 0 days  Quantity: 0.00;  Refills: 0   Ordered :17-Apr-2011  [name] ;   Started 24-February-2009 Active 02/24/09  Yes [provider]  OMEGA 3-6-9 FATTY ACIDS PO Reported on 10/04/2015 02/24/09  Yes [provider]  rizatriptan (MAXALT) 5 MG tablet TAKE 1 TABLET BY MOUTH AS NEEDED FOR MIGRAINE. MAY REPEAT IN 2 HOURS IF NEEDED. 03/31/19  Yes Fenton Malling M, PA-C  simvastatin (ZOCOR) 20 MG tablet Take 1 tablet (20 mg total) by mouth at bedtime. 03/29/19  Yes [name], PA-C  Vitamin D, Ergocalciferol, (DRISDOL) 1.25 MG (50000 UT) CAPS capsule Take 1 capsule (50,000 Units total) by mouth every 7 (seven) days. 03/29/19  Yes [name], PA-C  vitamin E 1000 UNIT capsule Take 1,000 Units by mouth daily.   Yes [provider]    Allergies: Allergies  Allergen Reactions  . Morphine     Anaphallaxis.    Review of Systems: Review of Systems  Constitutional: Negative for chills and fever.  Respiratory: Negative for shortness of breath.   Cardiovascular: Negative for chest pain.  Gastrointestinal: Negative for abdominal pain, nausea and vomiting.    Physical Exam BP (!) 161/79   Pulse (!) 55   Temp 97.7 F (36.5 C) (Temporal)   Resp 16   Ht _0  (1.651 m)   Wt 226 lb 9.6 oz (102.8 kg)   SpO2 94%   BMI 37.71 kg/m  CONSTITUTIONAL: No acute distress Rest of physical exam was not performed due to prolonged discussion about plan of treatment and she will come back on 8/7 for further discussion and to complete exam.  Labs/Imaging: CT chest 03/17/19: IMPRESSION: 1. Decrease in size of right breast mass and right axillary lymph node. No evidence of distant metastatic disease. 2. New mid and lower lung zone peribronchovascular and peripheral ground-glass. There is a spectrum of findings in the lungs which can be seen with acute atypical infection (as well as other non-infectious etiologies). In particular, viral pneumonia (including COVID-19) should be considered in the appropriate clinical setting. These results will be  called to the ordering clinician or representative by the Radiologist Assistant, and communication documented in the PACS or zVision Dashboard. 3. Asymmetrically enlarged left lobe of the thyroid with a dominant left thyroid nodule, as before. Associated mass effect and compression of the trachea. Consider further evaluation with thyroid ultrasound. If patient is clinically hyperthyroid, consider nuclear medicine thyroid uptake and scan. 4. Aortic atherosclerosis (ICD10-170.0). Coronary artery calcification. 5. Enlarged pulmonic trunk, indicative of pulmonary arterial hypertension.  Biopsy Right breast and right axilla: DIAGNOSIS:  A. BREAST, RIGHT 12:30, 2 CMFN; ULTRASOUND GUIDED BIOPSY:  - INVASIVE MAMMARY CARCINOMA, NO SPECIAL TYPE.   Size of invasive carcinoma: 11 mm in this sample  Histologic grade of invasive carcinoma: Grade 2            Glandular/tubular differentiation score: 3  Nuclear pleomorphism score: 2            Mitotic rate score: 1            Total score: 6  Ductal carcinoma in situ: Not identified   B. LYMPH NODE, RIGHT AXILLA; ULTRASOUND GUIDED BIOPSY:  - METASTATIC MAMMARY CARCINOMA INVOLVING LYMPH NODE TISSUE.  - CARCINOMA MEASURES 8 MM IN THIS SAMPLE.   Assessment and Plan: This is a 68 y.o. female with right breast cancer involving axillary lymph nodes.  The patient mentioned that she would prefer having a mastectomy instead of a lumpectomy as initially planned.  However, when discussing plans for the axilla, I mentioned that she would need an axillary dissection, but she was under the impression that she would only have some lymph nodes removed instead.  I informed her that given that multiple nodes, a full dissection would be better as it is unlikely that only one node is involved.  I do not think that sentinel node biopsy alone would be enough given that the node carcinoma measured 8 mm in the sample.  We  also discussed that she may also need chemotherapy.  When discussed about why she did not want chemotherapy at the beginning, she thought what's in the notes is not accurate and that she only did not want the chemotherapy as a first choice and that COVID-19 did not have a role in her choice.  She mentions that chemotherapy is not her first or ideal choice but she would do it if it would help.    There is a misunderstanding about how her options were presented at the beginning and the surgical plan of action.  At this point, I informed the patient that rather continuing a path where we're clearly not with the same understanding about plan of action, I would discuss with Dr. Grayland Ormond about the Letrozole plan, whether the patient would indeed need chemotherapy preoperatively instead, and I will also ask about the duration of treatment.  Furthermore, I will confer with my surgical colleagues about whether the patient should proceed with axillary node dissection from the beginning or if a sentinel lymph node biopsy is indicated.  Having a mastectomy instead of lumpectomy is perfectly reasonable.  Based on these discussions, I will have a more concrete plan of action as recommended by everyone so I can discuss her options in a follow up appointment on 04/23/19.  Patient is in agreement with this plan and will follow up on 8/7.  Discussed with her from the general standpoint, my goal would be to take her to the operating room next week, possibly on 8/13.  She agrees with this.  Face-to-face time spent with the patient and care providers was 40 minutes, with more than 50% of the time spent counseling, educating, and coordinating care of the patient.     Melvyn Neth, [facility]

## 2019-04-20 NOTE — Patient Instructions (Addendum)
Dr.Piscoya will consult with his Colleagues and discuss this at your follow up appointment.  Please see your follow up appointment listed below. Please arrive at 8:30 am 04/23/2019.    Total or Modified Radical Mastectomy, Care After This sheet gives you information about how to care for yourself after your procedure. Your health care provider may also give you more specific instructions. If you have problems or questions, contact your health care provider. What can I expect after the procedure? After the procedure, it is common to have:  Pain.  Numbness.  Stiffness in the arm or shoulder.  Feelings of stress, sadness, or depression. If the lymph nodes under your arm were removed, you may have arm swelling, weakness, or numbness on the same side of your body as your surgery. Follow these instructions at home: Incision care   Follow instructions from your health care provider about how to take care of your incision. Make sure you: ? Wash your hands with soap and water before you change your bandage (dressing). If soap and water are not available, use hand sanitizer. ? Change your dressing as told by your health care provider. ? Leave stitches (sutures), skin glue, or adhesive strips in place. These skin closures may need to stay in place for 2 weeks or longer. If adhesive strip edges start to loosen and curl up, you may trim the loose edges. Do not remove adhesive strips completely unless your health care provider tells you to do that.  Check your incision area every day for signs of infection. Check for: ? Redness, swelling, or more pain. ? Fluid or blood. ? Warmth. ? Pus or a bad smell.  If you were sent home with a surgical drain in place, follow instructions from your health care provider about emptying it. Bathing  Do not take baths, swim, or use a hot tub until your health care provider approves. Ask your health care provider if you may take showers. You may only be allowed to  take sponge baths. Activity  Return to your normal activities as told by your health care provider. Ask your health care provider what activities are safe for you.  Avoid activities that take a lot of effort.  Be careful to avoid any activities that could cause an injury to your arm on the side of your surgery.  Do not lift anything that is heavier than 10 lb (4.5 kg), or the limit that you are told, until your health care provider says that it is safe.  Avoid lifting with the arm on the side of your surgery.  Do not carry heavy objects on your shoulder.  After your drain is removed, do exercises to prevent stiffness and swelling in your arm. Talk with your health care provider about which exercises are safe for you. General instructions  Take over-the-counter and prescription medicines only as told by your health care provider.  You may eat what you usually do.  Keep your arm raised (elevated) above the level of your heart when you are sitting or lying down.  Do not wear tight jewelry on your arm, wrist, or fingers on the side of your surgery.  You may be given a tight sleeve (compression bandage) to wear over your arm on the side of your surgery. Wear this sleeve as told by your health care provider.  Ask your health care provider when you can start wearing a bra or using a breast prosthesis.  Before you are involved in certain procedures such as  giving blood or having your blood pressure checked, tell all your health care providers if lymph nodes under your arm were removed. This is important information. Follow-up  Keep all follow-up visits as told by your health care provider. This is important.  Get checked for extra fluid around your lymph nodes (lymphedema) as often as told by your health care provider. Contact a health care provider if:  You have a fever.  Your pain medicine is not working.  Your arm swelling, weakness, or numbness has not improved after a few  weeks.  You have new swelling in your breast area or arm.  You have redness, swelling, or more pain in your incision area.  You have fluid or blood coming from your incision.  Your incision feels warm to the touch.  You have pus or a bad smell coming from your incision. Get help right away if:  You have very bad pain in your breast area or arm.  You have chest pain.  You have difficulty breathing. Summary  Follow instructions from your health care provider about how to take care of your incision. Check your incision area every day for signs of infection.  Ask your health care provider what activities are safe for you.  Keep all follow-up visits as told by your health care provider. This is important.  Make sure you know which symptoms should cause you to contact your health care provider or to get help right away. This information is not intended to replace advice given to you by your health care provider. Make sure you discuss any questions you have with your health care provider. Document Released: 04/25/2004 Document Revised: 11/06/2018 Document Reviewed: 06/06/2017 Elsevier Patient Education  2020 [employer].

## 2019-04-22 ENCOUNTER — Other Ambulatory Visit: Payer: Self-pay | Admitting: Otolaryngology

## 2019-04-22 DIAGNOSIS — E041 Nontoxic single thyroid nodule: Secondary | ICD-10-CM

## 2019-04-23 ENCOUNTER — Other Ambulatory Visit: Payer: Self-pay

## 2019-04-23 ENCOUNTER — Ambulatory Visit (INDEPENDENT_AMBULATORY_CARE_PROVIDER_SITE_OTHER): Payer: Medicare Other | Admitting: Surgery

## 2019-04-23 ENCOUNTER — Encounter: Payer: Self-pay | Admitting: Surgery

## 2019-04-23 VITALS — BP 146/87 | HR 66 | Temp 97.7°F | Ht 67.0 in | Wt 227.0 lb

## 2019-04-23 DIAGNOSIS — C50311 Malignant neoplasm of lower-inner quadrant of right female breast: Secondary | ICD-10-CM | POA: Diagnosis not present

## 2019-04-23 DIAGNOSIS — Z170 Estrogen receptor positive status [ER+]: Secondary | ICD-10-CM | POA: Diagnosis not present

## 2019-04-23 NOTE — Patient Instructions (Signed)
Patient to see Dr. Grayland Ormond

## 2019-04-23 NOTE — Progress Notes (Signed)
04/23/2019  History of Present Illness: Nichole Martinez is a 68 y.o. female presents today for follow up discussion about surgical plans for her right breast cancer.  She was diagnosed in 12/2018 after biopsy of right breast mass and right axillary mass.  She underwent endocrine therapy with Letrozole.  On 8/4, I had a discussion with the patient about surgical plans.  She opted for mastectomy for breast mass control, but when discussing the right axilla, my treatment plan was an axillary node dissection.  However, she reports that was not the surgical plan she had been told about before.  Discussed with her about chemotherapy and that she had declined it based on notes, but that apparently was also inaccurate from her standpoint.  Given the discrepancies and different plans that we had in mind, I offered to discuss with Dr. Grayland Ormond and my partners about what would be the best plan of action and then discuss with her in a new follow up appointment.  She presents today for this.  She reports she has not been sleeping well and appears to have been crying recently.   Past Medical History: Past Medical History:  Diagnosis Date  . Allergy   . Breast cancer, right ([place]) 2020   Currently taking hormonal chemo tx's.   . Diabetes mellitus without complication ([place]) 7017  . Hyperlipidemia   . Hypertension   . Osteoporosis   . Trigeminal neuralgia      Past Surgical History: Past Surgical History:  Procedure Laterality Date  . ABDOMINAL HYSTERECTOMY  1978  . BREAST BIOPSY Right 01/06/2019   [place] bx x 2.  Ribbon marker for mass and hydro for Lymph node. Path pending  . BREAST CYST EXCISION Right 1986  . BREAST EXCISIONAL BIOPSY    . LAMINECTOMY  1995   L1, L2, L3  . SPINE SURGERY      Home Medications: Prior to Admission medications   Medication Sig Start Date End Date Taking? Authorizing Provider  ALPRAZolam Duanne Moron) 1 MG tablet TAKE ONE TABLET BY MOUTH AT BEDTIME AS NEEDED FOR ANXIETY 03/29/19   Yes [name], PA-C  amLODipine (NORVASC) 5 MG tablet Take 1 tablet (5 mg total) by mouth daily. 03/29/19  Yes [name], PA-C  baclofen (LIORESAL) 10 MG tablet Take 1 tablet (10 mg total) by mouth daily. 03/29/19  Yes [name], PA-C  bisoprolol-hydrochlorothiazide (ZIAC) 10-6.25 MG tablet Take 1 tablet by mouth 2 (two) times daily. 03/29/19  Yes [name], PA-C  buPROPion (WELLBUTRIN XL) 150 MG 24 hr tablet Take 1 tablet (150 mg total) by mouth 2 (two) times a day. 04/12/19  Yes [name], PA-C  Cholecalciferol (VITAMIN D3) 5000 units CAPS Take by mouth.   Yes [provider]  letrozole (FEMARA) 2.5 MG tablet Take 1 tablet (2.5 mg total) by mouth daily. 01/13/19  Yes Lloyd Huger, MD  lisinopril-hydrochlorothiazide (ZESTORETIC) 10-12.5 MG tablet Take 1 tablet by mouth daily. 03/29/19  Yes [name], PA-C  loratadine (CLARITIN) 10 MG tablet Take 1 tablet (10 mg total) by mouth daily as needed. 03/29/19  Yes [name], PA-C  meloxicam (MOBIC) 15 MG tablet Take 1 tablet (15 mg total) by mouth daily. 03/29/19  Yes Fenton Malling M, PA-C  metFORMIN (GLUCOPHAGE) 1000 MG tablet Take 1 tablet (1,000 mg total) by mouth 2 (two) times daily with a meal. 03/29/19  Yes Burnette, Clearnce Sorrel, PA-C  MULTIPLE VITAMINS PO MULTIVITAMINS (Oral Tablet)  1  Every Day for 0 days  Quantity: 0.00;  Refills: 0   Ordered :17-Apr-2011  [name] ;  Started 24-February-2009 Active 02/24/09  Yes [provider]  OMEGA 3-6-9 FATTY ACIDS PO Reported on 10/04/2015 02/24/09  Yes [provider]  rizatriptan (MAXALT) 5 MG tablet TAKE 1 TABLET BY MOUTH AS NEEDED FOR MIGRAINE. MAY REPEAT IN 2 HOURS IF NEEDED. 03/31/19  Yes Fenton Malling M, PA-C  simvastatin (ZOCOR) 20 MG tablet Take 1 tablet (20 mg total) by mouth at bedtime. 03/29/19  Yes [name], PA-C  Vitamin D, Ergocalciferol, (DRISDOL) 1.25 MG (50000 UT) CAPS capsule  Take 1 capsule (50,000 Units total) by mouth every 7 (seven) days. 03/29/19  Yes [name], PA-C  vitamin E 1000 UNIT capsule Take 1,000 Units by mouth daily.   Yes [provider]    Allergies: Allergies  Allergen Reactions  . Morphine     Anaphallaxis.     Physical Exam BP (!) 146/87   Pulse 66   Temp 97.7 F (36.5 C) (Skin)   Ht 5\' 7"  (1.702 m)   Wt 227 lb (103 kg)   SpO2 98%   BMI 35.55 kg/m  CONSTITUTIONAL: No acute distress, but appears tired and had been crying.  Labs/Imaging: None new  Assessment and Plan: This is a 68 y.o. female with right breast cancer with positive right axillary lymph node  Discussed with the patient about my conversation with Dr. Grayland Ormond and my partners.  The patient is very concerned about an axillary node dissection, and she reports that she has seen a few people with lymphedema and does not want that to happen.  Discussed with her that my partners agree that an axillary node dissection would be necessary.  Perhaps a sentinel node biopsy could be done first, but given the tumor burden, I would say it's highly likely that she would need a dissection.  Dr. Grayland Ormond mentioned that his memory of the conversation still involved her declining chemotherapy due to immunosuppression concerns, but at the end, she did have COVID-19, and in a way, it's good that she was not immunosuppressed.  Dr. Grayland Ormond recommends at this point that to give her the best chances of possibly not needing an axillary node dissection, she would need neoadjuvant chemotherapy.  My partners agree with this too.  My plan would then be for the patient to have neoadjuvant chemotherapy, restaging imaging, and then mastectomy with sentinel node biopsy, with the possibility of having to do an axillary node dissection if the sentinel nodes are positive.    The patient reports that she has arranged for a second opinion at [facility] with Dr. Dory Larsen to discuss with her  possible surgical plans.  She also wants to set up another appointment with Dr. Grayland Ormond so she can discuss with him more details about neoadjuvant chemotherapy, particularly which medications are used, how long would the treatment be, and what side effects can come up from the medications.  I have messaged Dr. Grayland Ormond and his office will set up a video appointment next week.  Pending on her appointment at [facility] and with Dr. Grayland Ormond, we can plan for port-a-cath placement vs modified radical mastectomy per patient's preference.  Patient will call us back with her decision.  Face-to-face time spent with the patient and care providers was 40 minutes, with more than 50% of the time spent counseling, educating, and coordinating care of the patient.     Melvyn Neth, [facility]

## 2019-04-24 NOTE — Progress Notes (Signed)
[place]  [phone] [phone]) [phone]  ID: Nichole Martinez OB: 02-May-1951  MR#: [id number]  MBT#:[id number]  Patient Care Team: Nichole Martinez as PCP - General (Family Medicine)  I connected with Nichole Martinez on 04/29/19 at  9:15 AM EDT by video enabled telemedicine visit and verified that I am speaking with the correct person using two identifiers.   I discussed the limitations, risks, security and privacy concerns of performing an evaluation and management service by telemedicine and the availability of in-person appointments. I also discussed with the patient that there may be a patient responsible charge related to this service. The patient expressed understanding and agreed to proceed.   Other persons participating in the visit and their role in the encounter: Patient, MD  Patient's location: Home Provider's location: Clinic  CHIEF COMPLAINT: Clinical stage IIa ER/PR positive, HER-2 negative invasive carcinoma of the central portion of the right breast.  INTERVAL HISTORY: Patient agreed to video enabled telemedicine visit for further evaluation and treatment planning.  She continues to tolerate letrozole well without significant side effects.  She currently feels well and is asymptomatic. She has no neurologic complaints.  She denies any recent fevers or illnesses.  She has a good appetite and denies weight loss.  She denies any chest pain, shortness of breath, cough, or hemoptysis.  She denies any nausea, vomiting, constipation, or diarrhea.  She has no urinary complaints.  Patient offers no specific complaints today.  REVIEW OF SYSTEMS:   Review of Systems  Constitutional: Negative.  Negative for fever, malaise/fatigue and weight loss.  Respiratory: Negative.  Negative for cough, hemoptysis and shortness of breath.   Cardiovascular: Negative.  Negative for chest pain and leg swelling.  Gastrointestinal: Negative.  Negative for  abdominal pain, diarrhea and nausea.  Genitourinary: Negative.  Negative for dysuria.  Musculoskeletal: Negative.  Negative for back pain and myalgias.  Skin: Negative.  Negative for rash.  Neurological: Negative.  Negative for focal weakness, weakness and headaches.  Psychiatric/Behavioral: The patient is nervous/anxious.     As per HPI. Otherwise, a complete review of systems is negative.  PAST MEDICAL HISTORY: Past Medical History:  Diagnosis Date  . Allergy   . Breast cancer, right ([place]) 2020   Currently taking hormonal chemo tx's.   . Diabetes mellitus without complication ([place]) 5364  . Hyperlipidemia   . Hypertension   . Osteoporosis   . Trigeminal neuralgia     PAST SURGICAL HISTORY: Past Surgical History:  Procedure Laterality Date  . ABDOMINAL HYSTERECTOMY  1978  . BREAST BIOPSY Right 01/06/2019   [place] bx x 2.  Ribbon marker for mass and hydro for Lymph node. Path pending  . BREAST CYST EXCISION Right 1986  . BREAST EXCISIONAL BIOPSY    . LAMINECTOMY  1995   L1, L2, L3  . SPINE SURGERY      FAMILY HISTORY: Family History  Adopted: Yes  Family history unknown: Yes    ADVANCED DIRECTIVES (Y/N):  N  HEALTH MAINTENANCE: Social History   Tobacco Use  . Smoking status: Former Smoker    Quit date: 03/16/2000    Years since quitting: 19.1  . Smokeless tobacco: Never Used  Substance Use Topics  . Alcohol use: No  . Drug use: No     Colonoscopy:  PAP:  Bone density:  Lipid panel:  Allergies  Allergen Reactions  . Morphine     Anaphallaxis.    Current Outpatient Medications  Medication Sig Dispense  Refill  . ALPRAZolam (XANAX) 1 MG tablet TAKE ONE TABLET BY MOUTH AT BEDTIME AS NEEDED FOR ANXIETY 90 tablet 1  . amLODipine (NORVASC) 5 MG tablet Take 1 tablet (5 mg total) by mouth daily. 90 tablet 3  . baclofen (LIORESAL) 10 MG tablet Take 1 tablet (10 mg total) by mouth daily. 90 tablet 3  . bisoprolol-hydrochlorothiazide (ZIAC) 10-6.25 MG tablet Take  1 tablet by mouth 2 (two) times daily. 180 tablet 3  . buPROPion (WELLBUTRIN XL) 150 MG 24 hr tablet Take 1 tablet (150 mg total) by mouth 2 (two) times a day. 180 tablet 1  . Cholecalciferol (VITAMIN D3) 5000 units CAPS Take by mouth.    . letrozole (FEMARA) 2.5 MG tablet Take 1 tablet (2.5 mg total) by mouth daily. 90 tablet 3  . lisinopril-hydrochlorothiazide (ZESTORETIC) 10-12.5 MG tablet Take 1 tablet by mouth daily. 90 tablet 3  . loratadine (CLARITIN) 10 MG tablet Take 1 tablet (10 mg total) by mouth daily as needed. 90 tablet 3  . meloxicam (MOBIC) 15 MG tablet Take 1 tablet (15 mg total) by mouth daily. 90 tablet 3  . metFORMIN (GLUCOPHAGE) 1000 MG tablet Take 1 tablet (1,000 mg total) by mouth 2 (two) times daily with a meal. 180 tablet 3  . MULTIPLE VITAMINS PO MULTIVITAMINS (Oral Tablet)  1 Every Day for 0 days  Quantity: 0.00;  Refills: 0   Ordered :17-Apr-2011  [name] ;  Started 24-February-2009 Active    . OMEGA 3-6-9 FATTY ACIDS PO Reported on 10/04/2015    . rizatriptan (MAXALT) 5 MG tablet TAKE 1 TABLET BY MOUTH AS NEEDED FOR MIGRAINE. MAY REPEAT IN 2 HOURS IF NEEDED. 10 tablet 0  . simvastatin (ZOCOR) 20 MG tablet Take 1 tablet (20 mg total) by mouth at bedtime. 90 tablet 3  . Vitamin D, Ergocalciferol, (DRISDOL) 1.25 MG (50000 UT) CAPS capsule Take 1 capsule (50,000 Units total) by mouth every 7 (seven) days. 12 capsule 4  . vitamin E 1000 UNIT capsule Take 1,000 Units by mouth daily.     No current facility-administered medications for this visit.     OBJECTIVE: There were no vitals filed for this visit.   There is no height or weight on file to calculate BMI.    ECOG FS:0 - Asymptomatic  General: Well-developed, well-nourished, no acute distress. HEENT: Normocephalic. Neuro: Alert, answering all questions appropriately. Cranial nerves grossly intact. Psych: Normal affect.   LAB RESULTS:  Lab Results  Component Value Date   NA 139 03/29/2019   K 4.5 03/29/2019    CL 106 03/29/2019   CO2 21 03/29/2019   GLUCOSE 75 03/29/2019   BUN 21 03/29/2019   CREATININE 0.96 03/29/2019   CALCIUM 9.3 03/29/2019   PROT 5.8 (L) 03/29/2019   ALBUMIN 4.0 03/29/2019   AST 15 03/29/2019   ALT 13 03/29/2019   ALKPHOS 48 03/29/2019   BILITOT 0.3 03/29/2019   GFRNONAA 62 03/29/2019   GFRAA 72 03/29/2019    Lab Results  Component Value Date   WBC 8.9 03/29/2019   NEUTROABS 4.4 03/29/2019   HGB 13.6 03/29/2019   HCT 43.3 03/29/2019   MCV 83 03/29/2019   PLT 257 03/29/2019     STUDIES: [place] Fna Bx Thyroid 1st Lesion Afirma  Result Date: 04/26/2019 INDICATION: Right thyroid lobe nodule. EXAM: ULTRASOUND-DIRECTED THYROID BIOPSY MEDICATIONS: None. ANESTHESIA/SEDATION: Local 1% lidocaine. FLUOROSCOPY TIME:  Fluoroscopy Time: 0 minutes 0 seconds COMPLICATIONS: Nine. PROCEDURE: After discussing the risks and benefits of  this procedure with the patient informed consent was obtained. Right neck was sterilely prepped and draped. Following local anesthesia with 1% lidocaine, four 25 gauge needle aspiration samples obtained for pathology. No complications. Postprocedural instructions given. Follow-up with patient's physician. IMPRESSION: Successful ultrasound-directed dominant right thyroid nodule fine needle aspiration. Sample sent to pathology. Electronically Signed   By: Marcello Moores  Register   On: 04/26/2019 13:59   US Thyroid  Result Date: 04/15/2019 CLINICAL DATA:  Palpable abnormality.  Palpable thyroid nodule. EXAM: THYROID ULTRASOUND TECHNIQUE: Ultrasound examination of the thyroid gland and adjacent soft tissues was performed. COMPARISON:  None. FINDINGS: Parenchymal Echotexture: Moderately heterogenous Isthmus: Enlarged measuring 1 cm in diameter Right lobe: Borderline enlarged measuring 5.4 x 2.2 x 2.1 cm Left lobe: Enlarged measuring 6.3 x 3.1 x 3.6 cm. _________________________________________________________ Estimated total number of nodules >/= 1 cm: 1 Number of  spongiform nodules >/=  2 cm not described below (TR1): 0 Number of mixed cystic and solid nodules >/= 1.5 cm not described below (TR2): 0 _________________________________________________________ There is an ill-defined approximately 1.2 x 1.1 x 0.9 cm isoechoic nodule/pseudonodule within the left-sided thyroid isthmus (labeled 1), which does not meet imaging criteria to recommend percutaneous sampling or continued dedicated follow-up. _________________________________________________________ There is an approximately 0.7 x 0.7 x 0.6 cm hypoechoic nodule within the superior pole of the right lobe of the thyroid (labeled 2), which does not meet imaging criteria to recommend percutaneous sampling or continued dedicated follow-up There is an approximately 0.6 x 0.6 x 0.5 cm anechoic cyst within the mid aspect the right lobe of the thyroid (labeled 3), which contains a echogenic foci with ring down artifact compatible with benign colloid. This benign colloid containing cyst does not meet imaging criteria to recommend percutaneous sampling or continued dedicated follow-up There is an approximately 0.9 x 0.9 x 0.8 cm spongiform/benign-appearing nodule within the mid aspect the right lobe of the thyroid (labeled 4), which does not meet imaging criteria to recommend percutaneous sampling or continued dedicated follow-up. _________________________________________________________ Nodule # 5: Location: Right; Inferior Maximum size: 2.0 cm; Other 2 dimensions: 2.0 x 1.2 cm Composition: solid/almost completely solid (2) Echogenicity: isoechoic (1) Shape: not taller-than-wide (0) Margins: ill-defined (0) Echogenic foci: none (0) ACR TI-RADS total points: 3. ACR TI-RADS risk category: TR3 (3 points). ACR TI-RADS recommendations: **Given size (>/= 2.5 cm) and appearance, fine needle aspiration of this mildly suspicious nodule should be considered based on TI-RADS criteria. _________________________________________________________  IMPRESSION: 1. Borderline thyromegaly with findings suggestive of multinodular goiter. 2. Nodule #5 within the right lobe of the thyroid meets imaging criteria to recommend percutaneous sampling. 3. None of the remaining nodules/pseudo nodules meet imaging criteria to recommend percutaneous sampling or continued dedicated follow-up. The above is in keeping with the ACR TI-RADS recommendations - J Am Coll Radiol 2017;14:587-595. Electronically Signed   By: Sandi Mariscal M.D.   On: 04/15/2019 17:05    ASSESSMENT: Clinical stage IIa ER/PR positive, HER-2 negative invasive carcinoma of the central portion of the right breast.  Oncotype DX score 7.  PLAN:   1. Clinical stage IIa ER/PR positive, HER-2 negative invasive carcinoma of the central portion of the right breast: Although patient is clinical stage IIa and typically neoadjuvant chemotherapy is the recommendation, patient did not wish to pursue aggressive immunosuppressive treatment during the COVID-19 pandemic.  Because of this, patient initiated neoadjuvant letrozole in approximately April 2020.  Patient is now more comfortable initiating treatment and has agreed to neoadjuvant chemotherapy using Adriamycin, Cytoxan with Neulasta support  followed by weekly Taxol x12.  Once patient has her surgery, she will likely require adjuvant XRT.  Patient will require MUGA scan as well as port placement prior to initiating treatment.  Return to clinic on May 09, 2021 initiate cycle 1 of Adriamycin and Cytoxan.       2.  COVID positivity: Resolved.  Patient reports repeat testing is negative.  She is no longer under quarantine.  It is likely that the groundglass densities seen on her CT scan from March 17, 2019 are residual from her infection.  No intervention is needed at this time. 3.  Nausea and diarrhea: Resolved.   4.  Thyroid nodule: Biopsy reported as benign.  Likely multinodular goiter.  I provided 24 minutes of face-to-face video visit time during this  encounter, and > 50% was spent counseling as documented under my assessment & plan.    Patient expressed understanding and was in agreement with this plan. She also understands that She can call clinic at any time with any questions, concerns, or complaints.   Cancer Staging Cancer of central portion of right female breast [facility]) Staging form: Breast, AJCC 8th Edition - Clinical stage from 01/13/2019: Stage IIA (cT2, cN1, cM0, G1, ER+, PR+, HER2-) - Signed by Lloyd Huger, MD on 01/13/2019   Lloyd Huger, MD   04/29/2019 6:45 PM

## 2019-04-26 ENCOUNTER — Ambulatory Visit
Admission: RE | Admit: 2019-04-26 | Discharge: 2019-04-26 | Disposition: A | Discharge status: Home / Self Care | Payer: Medicare Other | Source: Ambulatory Visit | Attending: Otolaryngology | Admitting: Otolaryngology

## 2019-04-26 ENCOUNTER — Other Ambulatory Visit: Payer: Self-pay

## 2019-04-26 DIAGNOSIS — Z885 Allergy status to narcotic agent status: Secondary | ICD-10-CM | POA: Diagnosis not present

## 2019-04-26 DIAGNOSIS — E041 Nontoxic single thyroid nodule: Secondary | ICD-10-CM | POA: Diagnosis not present

## 2019-04-26 DIAGNOSIS — C773 Secondary and unspecified malignant neoplasm of axilla and upper limb lymph nodes: Secondary | ICD-10-CM | POA: Diagnosis not present

## 2019-04-26 DIAGNOSIS — C50911 Malignant neoplasm of unspecified site of right female breast: Secondary | ICD-10-CM | POA: Diagnosis not present

## 2019-04-27 LAB — CYTOLOGY - NON PAP

## 2019-04-28 ENCOUNTER — Other Ambulatory Visit: Payer: Self-pay

## 2019-04-29 ENCOUNTER — Inpatient Hospital Stay (HOSPITAL_BASED_OUTPATIENT_CLINIC_OR_DEPARTMENT_OTHER): Payer: Medicare Other | Admitting: Oncology

## 2019-04-29 ENCOUNTER — Other Ambulatory Visit: Payer: Self-pay

## 2019-04-29 DIAGNOSIS — C50111 Malignant neoplasm of central portion of right female breast: Secondary | ICD-10-CM

## 2019-04-29 DIAGNOSIS — Z170 Estrogen receptor positive status [ER+]: Secondary | ICD-10-CM | POA: Diagnosis not present

## 2019-04-29 DIAGNOSIS — Z7189 Other specified counseling: Secondary | ICD-10-CM | POA: Insufficient documentation

## 2019-04-29 MED ORDER — LIDOCAINE-PRILOCAINE 2.5-2.5 % EX CREA: TOPICAL_CREAM | CUTANEOUS | 3 refills | Status: DC

## 2019-04-29 MED ORDER — ONDANSETRON HCL 8 MG PO TABS: 8.0000 mg | ORAL_TABLET | Freq: Two times a day (BID) | ORAL | 2 refills | Status: DC | PRN

## 2019-04-29 MED ORDER — PROCHLORPERAZINE MALEATE 10 MG PO TABS: 10.0000 mg | ORAL_TABLET | Freq: Four times a day (QID) | ORAL | 2 refills | Status: DC | PRN

## 2019-04-29 NOTE — Progress Notes (Signed)
Patient doximity visit today for follow up to discuss chemotherapy treatment options.

## 2019-04-29 NOTE — Progress Notes (Signed)
START ON PATHWAY REGIMEN - Breast   Dose-Dense AC q14 days:   A cycle is every 14 days:     Doxorubicin      Cyclophosphamide      Pegfilgrastim-xxxx   **Always confirm dose/schedule in your pharmacy ordering system**  Paclitaxel 80 mg/m2 Weekly:   Administer weekly:     Paclitaxel   **Always confirm dose/schedule in your pharmacy ordering system**  Patient Characteristics: Preoperative or Nonsurgical Candidate (Clinical Staging), Neoadjuvant Therapy followed by Surgery, Invasive Disease, Chemotherapy, HER2 Negative/Unknown/Equivocal, ER Positive Therapeutic Status: Preoperative or Nonsurgical Candidate (Clinical Staging) AJCC M Category: cM0 AJCC Grade: G2 Breast Surgical Plan: Neoadjuvant Therapy followed by Surgery ER Status: Positive (+) AJCC 8 Stage Grouping: IIA HER2 Status: Negative (-) AJCC T Category: cT2 AJCC N Category: cN1 PR Status: Positive (+) Intent of Therapy: Curative Intent, Discussed with Patient 

## 2019-04-30 ENCOUNTER — Encounter: Payer: Self-pay | Admitting: *Deleted

## 2019-04-30 NOTE — Progress Notes (Signed)
Called patient per Dr. Gary Fleet request.  Patient would like to see Dr. Marlou Starks for port a cath placement and breast surgery.  She has been scheduled for 05/07/19 @ 9:50 in the [place] office.  Reminded to take a photo ID, insurance card and all her meds.

## 2019-05-05 ENCOUNTER — Other Ambulatory Visit: Payer: Self-pay

## 2019-05-05 ENCOUNTER — Ambulatory Visit
Admission: RE | Admit: 2019-05-05 | Discharge: 2019-05-05 | Disposition: A | Discharge status: Home / Self Care | Payer: Medicare Other | Source: Ambulatory Visit | Attending: Oncology | Admitting: Oncology

## 2019-05-05 DIAGNOSIS — C50111 Malignant neoplasm of central portion of right female breast: Secondary | ICD-10-CM | POA: Diagnosis not present

## 2019-05-05 DIAGNOSIS — C50911 Malignant neoplasm of unspecified site of right female breast: Secondary | ICD-10-CM | POA: Diagnosis not present

## 2019-05-05 DIAGNOSIS — Z01810 Encounter for preprocedural cardiovascular examination: Secondary | ICD-10-CM | POA: Insufficient documentation

## 2019-05-05 DIAGNOSIS — Z170 Estrogen receptor positive status [ER+]: Secondary | ICD-10-CM | POA: Insufficient documentation

## 2019-05-05 DIAGNOSIS — Z5111 Encounter for antineoplastic chemotherapy: Secondary | ICD-10-CM | POA: Diagnosis not present

## 2019-05-05 MED ORDER — TECHNETIUM TC 99M-LABELED RED BLOOD CELLS IV KIT
25.0000 | PACK | Freq: Once | INTRAVENOUS | Status: AC | PRN
  Administered 2019-05-05: 21.79 via INTRAVENOUS

## 2019-05-05 NOTE — Patient Instructions (Signed)
Doxorubicin injection What is this medicine? DOXORUBICIN (dox oh ROO bi sin) is a chemotherapy drug. It is used to treat many kinds of cancer like leukemia, lymphoma, neuroblastoma, sarcoma, and Wilms' tumor. It is also used to treat bladder cancer, breast cancer, lung cancer, ovarian cancer, stomach cancer, and thyroid cancer. This medicine may be used for other purposes; ask your health care provider or pharmacist if you have questions. COMMON BRAND NAME(S): Adriamycin, Adriamycin PFS, Adriamycin RDF, Rubex What should I tell my health care provider before I take this medicine? They need to know if you have any of these conditions:  heart disease  history of low blood counts caused by a medicine  liver disease  recent or ongoing radiation therapy  an unusual or allergic reaction to doxorubicin, other chemotherapy agents, other medicines, foods, dyes, or preservatives  pregnant or trying to get pregnant  breast-feeding How should I use this medicine? This drug is given as an infusion into a vein. It is administered in a hospital or clinic by a specially trained health care professional. If you have pain, swelling, burning or any unusual feeling around the site of your injection, tell your health care professional right away. Talk to your pediatrician regarding the use of this medicine in children. Special care may be needed. Overdosage: If you think you have taken too much of this medicine contact a poison control center or emergency room at once. NOTE: This medicine is only for you. Do not share this medicine with others. What if I miss a dose? It is important not to miss your dose. Call your doctor or health care professional if you are unable to keep an appointment. What may interact with this medicine? This medicine may interact with the following medications:  6-mercaptopurine  paclitaxel  phenytoin  St. John's Wort  trastuzumab  verapamil This list may not describe  all possible interactions. Give your health care provider a list of all the medicines, herbs, non-prescription drugs, or dietary supplements you use. Also tell them if you smoke, drink alcohol, or use illegal drugs. Some items may interact with your medicine. What should I watch for while using this medicine? This drug may make you feel generally unwell. This is not uncommon, as chemotherapy can affect healthy cells as well as cancer cells. Report any side effects. Continue your course of treatment even though you feel ill unless your doctor tells you to stop. There is a maximum amount of this medicine you should receive throughout your life. The amount depends on the medical condition being treated and your overall health. Your doctor will watch how much of this medicine you receive in your lifetime. Tell your doctor if you have taken this medicine before. You may need blood work done while you are taking this medicine. Your urine may turn red for a few days after your dose. This is not blood. If your urine is dark or brown, call your doctor. In some cases, you may be given additional medicines to help with side effects. Follow all directions for their use. Call your doctor or health care professional for advice if you get a fever, chills or sore throat, or other symptoms of a cold or flu. Do not treat yourself. This drug decreases your body's ability to fight infections. Try to avoid being around people who are sick. This medicine may increase your risk to bruise or bleed. Call your doctor or health care professional if you notice any unusual bleeding. Talk to your doctor   about your risk of cancer. You may be more at risk for certain types of cancers if you take this medicine. Do not become pregnant while taking this medicine or for 6 months after stopping it. Women should inform their doctor if they wish to become pregnant or think they might be pregnant. Men should not father a child while taking this  medicine and for 6 months after stopping it. There is a potential for serious side effects to an unborn child. Talk to your health care professional or pharmacist for more information. Do not breast-feed an infant while taking this medicine. This medicine has caused ovarian failure in some women and reduced sperm counts in some men This medicine may interfere with the ability to have a child. Talk with your doctor or health care professional if you are concerned about your fertility. This medicine may cause a decrease in Co-Enzyme Q-10. You should make sure that you get enough Co-Enzyme Q-10 while you are taking this medicine. Discuss the foods you eat and the vitamins you take with your health care professional. What side effects may I notice from receiving this medicine? Side effects that you should report to your doctor or health care professional as soon as possible:  allergic reactions like skin rash, itching or hives, swelling of the face, lips, or tongue  breathing problems  chest pain  fast or irregular heartbeat  low blood counts - this medicine may decrease the number of white blood cells, red blood cells and platelets. You may be at increased risk for infections and bleeding.  pain, redness, or irritation at site where injected  signs of infection - fever or chills, cough, sore throat, pain or difficulty passing urine  signs of decreased platelets or bleeding - bruising, pinpoint red spots on the skin, black, tarry stools, blood in the urine  swelling of the ankles, feet, hands  tiredness  weakness Side effects that usually do not require medical attention (report to your doctor or health care professional if they continue or are bothersome):  diarrhea  hair loss  mouth sores  nail discoloration or damage  nausea  red colored urine  vomiting This list may not describe all possible side effects. Call your doctor for medical advice about side effects. You may report  side effects to FDA at 1-800-FDA-1088. Where should I keep my medicine? This drug is given in a hospital or clinic and will not be stored at home. NOTE: This sheet is a summary. It may not cover all possible information. If you have questions about this medicine, talk to your doctor, pharmacist, or health care provider.  2020 Elsevier/Gold Standard (2017-04-16 11:01:26) Cyclophosphamide injection What is this medicine? CYCLOPHOSPHAMIDE (sye kloe FOSS fa mide) is a chemotherapy drug. It slows the growth of cancer cells. This medicine is used to treat many types of cancer like lymphoma, myeloma, leukemia, breast cancer, and ovarian cancer, to name a few. This medicine may be used for other purposes; ask your health care provider or pharmacist if you have questions. COMMON BRAND NAME(S): Cytoxan, Neosar What should I tell my health care provider before I take this medicine? They need to know if you have any of these conditions:  blood disorders  history of other chemotherapy  infection  kidney disease  liver disease  recent or ongoing radiation therapy  tumors in the bone marrow  an unusual or allergic reaction to cyclophosphamide, other chemotherapy, other medicines, foods, dyes, or preservatives  pregnant or trying to get  pregnant  breast-feeding How should I use this medicine? This drug is usually given as an injection into a vein or muscle or by infusion into a vein. It is administered in a hospital or clinic by a specially trained health care professional. Talk to your pediatrician regarding the use of this medicine in children. Special care may be needed. Overdosage: If you think you have taken too much of this medicine contact a poison control center or emergency room at once. NOTE: This medicine is only for you. Do not share this medicine with others. What if I miss a dose? It is important not to miss your dose. Call your doctor or health care professional if you are  unable to keep an appointment. What may interact with this medicine? This medicine may interact with the following medications:  amiodarone  amphotericin B  azathioprine  certain antiviral medicines for HIV or AIDS such as protease inhibitors (e.g., indinavir, ritonavir) and zidovudine  certain blood pressure medications such as benazepril, captopril, enalapril, fosinopril, lisinopril, moexipril, monopril, perindopril, quinapril, ramipril, trandolapril  certain cancer medications such as anthracyclines (e.g., daunorubicin, doxorubicin), busulfan, cytarabine, paclitaxel, pentostatin, tamoxifen, trastuzumab  certain diuretics such as chlorothiazide, chlorthalidone, hydrochlorothiazide, indapamide, metolazone  certain medicines that treat or prevent blood clots like warfarin  certain muscle relaxants such as succinylcholine  cyclosporine  etanercept  indomethacin  medicines to increase blood counts like filgrastim, pegfilgrastim, sargramostim  medicines used as general anesthesia  metronidazole  natalizumab This list may not describe all possible interactions. Give your health care provider a list of all the medicines, herbs, non-prescription drugs, or dietary supplements you use. Also tell them if you smoke, drink alcohol, or use illegal drugs. Some items may interact with your medicine. What should I watch for while using this medicine? Visit your doctor for checks on your progress. This drug may make you feel generally unwell. This is not uncommon, as chemotherapy can affect healthy cells as well as cancer cells. Report any side effects. Continue your course of treatment even though you feel ill unless your doctor tells you to stop. Drink water or other fluids as directed. Urinate often, even at night. In some cases, you may be given additional medicines to help with side effects. Follow all directions for their use. Call your doctor or health care professional for advice if  you get a fever, chills or sore throat, or other symptoms of a cold or flu. Do not treat yourself. This drug decreases your body's ability to fight infections. Try to avoid being around people who are sick. This medicine may increase your risk to bruise or bleed. Call your doctor or health care professional if you notice any unusual bleeding. Be careful brushing and flossing your teeth or using a toothpick because you may get an infection or bleed more easily. If you have any dental work done, tell your dentist you are receiving this medicine. You may get drowsy or dizzy. Do not drive, use machinery, or do anything that needs mental alertness until you know how this medicine affects you. Do not become pregnant while taking this medicine or for 1 year after stopping it. Women should inform their doctor if they wish to become pregnant or think they might be pregnant. Men should not father a child while taking this medicine and for 4 months after stopping it. There is a potential for serious side effects to an unborn child. Talk to your health care professional or pharmacist for more information. Do not breast-feed  an infant while taking this medicine. This medicine may interfere with the ability to have a child. This medicine has caused ovarian failure in some women. This medicine has caused reduced sperm counts in some men. You should talk with your doctor or health care professional if you are concerned about your fertility. If you are going to have surgery, tell your doctor or health care professional that you have taken this medicine. What side effects may I notice from receiving this medicine? Side effects that you should report to your doctor or health care professional as soon as possible:  allergic reactions like skin rash, itching or hives, swelling of the face, lips, or tongue  low blood counts - this medicine may decrease the number of white blood cells, red blood cells and platelets. You may be  at increased risk for infections and bleeding.  signs of infection - fever or chills, cough, sore throat, pain or difficulty passing urine  signs of decreased platelets or bleeding - bruising, pinpoint red spots on the skin, black, tarry stools, blood in the urine  signs of decreased red blood cells - unusually weak or tired, fainting spells, lightheadedness  breathing problems  dark urine  dizziness  palpitations  swelling of the ankles, feet, hands  trouble passing urine or change in the amount of urine  weight gain  yellowing of the eyes or skin Side effects that usually do not require medical attention (report to your doctor or health care professional if they continue or are bothersome):  changes in nail or skin color  hair loss  missed menstrual periods  mouth sores  nausea, vomiting This list may not describe all possible side effects. Call your doctor for medical advice about side effects. You may report side effects to FDA at 1-800-FDA-1088. Where should I keep my medicine? This drug is given in a hospital or clinic and will not be stored at home. NOTE: This sheet is a summary. It may not cover all possible information. If you have questions about this medicine, talk to your doctor, pharmacist, or health care provider.  2020 Elsevier/Gold Standard (2012-07-17 16:22:58) Pegfilgrastim injection What is this medicine? PEGFILGRASTIM (PEG fil gra stim) is a long-acting granulocyte colony-stimulating factor that stimulates the growth of neutrophils, a type of white blood cell important in the body's fight against infection. It is used to reduce the incidence of fever and infection in patients with certain types of cancer who are receiving chemotherapy that affects the bone marrow, and to increase survival after being exposed to high doses of radiation. This medicine may be used for other purposes; ask your health care provider or pharmacist if you have questions. COMMON  BRAND NAME(S): Steve Rattler, Ziextenzo What should I tell my health care provider before I take this medicine? They need to know if you have any of these conditions:  kidney disease  latex allergy  ongoing radiation therapy  sickle cell disease  skin reactions to acrylic adhesives (On-Body Injector only)  an unusual or allergic reaction to pegfilgrastim, filgrastim, other medicines, foods, dyes, or preservatives  pregnant or trying to get pregnant  breast-feeding How should I use this medicine? This medicine is for injection under the skin. If you get this medicine at home, you will be taught how to prepare and give the pre-filled syringe or how to use the On-body Injector. Refer to the patient Instructions for Use for detailed instructions. Use exactly as directed. Tell your healthcare provider immediately if you suspect  that the On-body Injector may not have performed as intended or if you suspect the use of the On-body Injector resulted in a missed or partial dose. It is important that you put your used needles and syringes in a special sharps container. Do not put them in a trash can. If you do not have a sharps container, call your pharmacist or healthcare provider to get one. Talk to your pediatrician regarding the use of this medicine in children. While this drug may be prescribed for selected conditions, precautions do apply. Overdosage: If you think you have taken too much of this medicine contact a poison control center or emergency room at once. NOTE: This medicine is only for you. Do not share this medicine with others. What if I miss a dose? It is important not to miss your dose. Call your doctor or health care professional if you miss your dose. If you miss a dose due to an On-body Injector failure or leakage, a new dose should be administered as soon as possible using a single prefilled syringe for manual use. What may interact with this  medicine? Interactions have not been studied. Give your health care provider a list of all the medicines, herbs, non-prescription drugs, or dietary supplements you use. Also tell them if you smoke, drink alcohol, or use illegal drugs. Some items may interact with your medicine. This list may not describe all possible interactions. Give your health care provider a list of all the medicines, herbs, non-prescription drugs, or dietary supplements you use. Also tell them if you smoke, drink alcohol, or use illegal drugs. Some items may interact with your medicine. What should I watch for while using this medicine? You may need blood work done while you are taking this medicine. If you are going to need a MRI, CT scan, or other procedure, tell your doctor that you are using this medicine (On-Body Injector only). What side effects may I notice from receiving this medicine? Side effects that you should report to your doctor or health care professional as soon as possible:  allergic reactions like skin rash, itching or hives, swelling of the face, lips, or tongue  back pain  dizziness  fever  pain, redness, or irritation at site where injected  pinpoint red spots on the skin  red or dark-brown urine  shortness of breath or breathing problems  stomach or side pain, or pain at the shoulder  swelling  tiredness  trouble passing urine or change in the amount of urine Side effects that usually do not require medical attention (report to your doctor or health care professional if they continue or are bothersome):  bone pain  muscle pain This list may not describe all possible side effects. Call your doctor for medical advice about side effects. You may report side effects to FDA at 1-800-FDA-1088. Where should I keep my medicine? Keep out of the reach of children. If you are using this medicine at home, you will be instructed on how to store it. Throw away any unused medicine after the  expiration date on the label. NOTE: This sheet is a summary. It may not cover all possible information. If you have questions about this medicine, talk to your doctor, pharmacist, or health care provider.  2020 Elsevier/Gold Standard (2017-12-08 16:57:08) Paclitaxel injection What is this medicine? PACLITAXEL (PAK li TAX el) is a chemotherapy drug. It targets fast dividing cells, like cancer cells, and causes these cells to die. This medicine is used to treat ovarian  cancer, breast cancer, lung cancer, Kaposi's sarcoma, and other cancers. This medicine may be used for other purposes; ask your health care provider or pharmacist if you have questions. COMMON BRAND NAME(S): Onxol, Taxol What should I tell my health care provider before I take this medicine? They need to know if you have any of these conditions:  history of irregular heartbeat  liver disease  low blood counts, like low white cell, platelet, or red cell counts  lung or breathing disease, like asthma  tingling of the fingers or toes, or other nerve disorder  an unusual or allergic reaction to paclitaxel, alcohol, polyoxyethylated castor oil, other chemotherapy, other medicines, foods, dyes, or preservatives  pregnant or trying to get pregnant  breast-feeding How should I use this medicine? This drug is given as an infusion into a vein. It is administered in a hospital or clinic by a specially trained health care professional. Talk to your pediatrician regarding the use of this medicine in children. Special care may be needed. Overdosage: If you think you have taken too much of this medicine contact a poison control center or emergency room at once. NOTE: This medicine is only for you. Do not share this medicine with others. What if I miss a dose? It is important not to miss your dose. Call your doctor or health care professional if you are unable to keep an appointment. What may interact with this medicine? Do not take  this medicine with any of the following medications:  disulfiram  metronidazole This medicine may also interact with the following medications:  antiviral medicines for hepatitis, HIV or AIDS  certain antibiotics like erythromycin and clarithromycin  certain medicines for fungal infections like ketoconazole and itraconazole  certain medicines for seizures like carbamazepine, phenobarbital, phenytoin  gemfibrozil  nefazodone  rifampin  St. John's wort This list may not describe all possible interactions. Give your health care provider a list of all the medicines, herbs, non-prescription drugs, or dietary supplements you use. Also tell them if you smoke, drink alcohol, or use illegal drugs. Some items may interact with your medicine. What should I watch for while using this medicine? Your condition will be monitored carefully while you are receiving this medicine. You will need important blood work done while you are taking this medicine. This medicine can cause serious allergic reactions. To reduce your risk you will need to take other medicine(s) before treatment with this medicine. If you experience allergic reactions like skin rash, itching or hives, swelling of the face, lips, or tongue, tell your doctor or health care professional right away. In some cases, you may be given additional medicines to help with side effects. Follow all directions for their use. This drug may make you feel generally unwell. This is not uncommon, as chemotherapy can affect healthy cells as well as cancer cells. Report any side effects. Continue your course of treatment even though you feel ill unless your doctor tells you to stop. Call your doctor or health care professional for advice if you get a fever, chills or sore throat, or other symptoms of a cold or flu. Do not treat yourself. This drug decreases your body's ability to fight infections. Try to avoid being around people who are sick. This medicine  may increase your risk to bruise or bleed. Call your doctor or health care professional if you notice any unusual bleeding. Be careful brushing and flossing your teeth or using a toothpick because you may get an infection or bleed more  easily. If you have any dental work done, tell your dentist you are receiving this medicine. Avoid taking products that contain aspirin, acetaminophen, ibuprofen, naproxen, or ketoprofen unless instructed by your doctor. These medicines may hide a fever. Do not become pregnant while taking this medicine. Women should inform their doctor if they wish to become pregnant or think they might be pregnant. There is a potential for serious side effects to an unborn child. Talk to your health care professional or pharmacist for more information. Do not breast-feed an infant while taking this medicine. Men are advised not to father a child while receiving this medicine. This product may contain alcohol. Ask your pharmacist or healthcare provider if this medicine contains alcohol. Be sure to tell all healthcare providers you are taking this medicine. Certain medicines, like metronidazole and disulfiram, can cause an unpleasant reaction when taken with alcohol. The reaction includes flushing, headache, nausea, vomiting, sweating, and increased thirst. The reaction can last from 30 minutes to several hours. What side effects may I notice from receiving this medicine? Side effects that you should report to your doctor or health care professional as soon as possible:  allergic reactions like skin rash, itching or hives, swelling of the face, lips, or tongue  breathing problems  changes in vision  fast, irregular heartbeat  high or low blood pressure  mouth sores  pain, tingling, numbness in the hands or feet  signs of decreased platelets or bleeding - bruising, pinpoint red spots on the skin, black, tarry stools, blood in the urine  signs of decreased red blood cells -  unusually weak or tired, feeling faint or lightheaded, falls  signs of infection - fever or chills, cough, sore throat, pain or difficulty passing urine  signs and symptoms of liver injury like dark yellow or brown urine; general ill feeling or flu-like symptoms; light-colored stools; loss of appetite; nausea; right upper belly pain; unusually weak or tired; yellowing of the eyes or skin  swelling of the ankles, feet, hands  unusually slow heartbeat Side effects that usually do not require medical attention (report to your doctor or health care professional if they continue or are bothersome):  diarrhea  hair loss  loss of appetite  muscle or joint pain  nausea, vomiting  pain, redness, or irritation at site where injected  tiredness This list may not describe all possible side effects. Call your doctor for medical advice about side effects. You may report side effects to FDA at 1-800-FDA-1088. Where should I keep my medicine? This drug is given in a hospital or clinic and will not be stored at home. NOTE: This sheet is a summary. It may not cover all possible information. If you have questions about this medicine, talk to your doctor, pharmacist, or health care provider.  2020 Elsevier/Gold Standard (2017-05-06 13:14:55)

## 2019-05-06 ENCOUNTER — Other Ambulatory Visit: Payer: Self-pay | Admitting: Oncology

## 2019-05-06 ENCOUNTER — Other Ambulatory Visit: Payer: Self-pay

## 2019-05-06 ENCOUNTER — Inpatient Hospital Stay: Payer: Medicare Other

## 2019-05-07 ENCOUNTER — Other Ambulatory Visit: Payer: Self-pay | Admitting: General Surgery

## 2019-05-07 ENCOUNTER — Telehealth: Payer: Self-pay | Admitting: *Deleted

## 2019-05-07 DIAGNOSIS — Z171 Estrogen receptor negative status [ER-]: Secondary | ICD-10-CM

## 2019-05-07 DIAGNOSIS — C50411 Malignant neoplasm of upper-outer quadrant of right female breast: Secondary | ICD-10-CM

## 2019-05-07 DIAGNOSIS — Z170 Estrogen receptor positive status [ER+]: Secondary | ICD-10-CM

## 2019-05-07 NOTE — Telephone Encounter (Signed)
Patient called reporting that her port will not nbe inserted until mid next week and so she will not be coming to her appointment for treatment on Monday and wants a return call to reschedule this. [phone]

## 2019-05-07 NOTE — Telephone Encounter (Signed)
Nevermind, it's there now.Scheduled for 8/26.

## 2019-05-07 NOTE — Telephone Encounter (Signed)
Please advise Dr.Finnegan. Is porting being placed at [facility]. I don't see anything scheduled.

## 2019-05-07 NOTE — Telephone Encounter (Signed)
Pt did not answer and her mailbox is full. I'll cancel the appt an call her Monday to r\s.

## 2019-05-07 NOTE — Telephone Encounter (Signed)
She will need to get the appts moved to after port placed.  Brooke, Can you get her rescheduled?

## 2019-05-10 ENCOUNTER — Inpatient Hospital Stay: Payer: Medicare Other

## 2019-05-10 ENCOUNTER — Inpatient Hospital Stay: Payer: Medicare Other | Admitting: Oncology

## 2019-05-11 ENCOUNTER — Ambulatory Visit
Admission: RE | Admit: 2019-05-11 | Discharge: 2019-05-11 | Disposition: A | Discharge status: Home / Self Care | Payer: Medicare Other | Source: Ambulatory Visit | Attending: General Surgery | Admitting: General Surgery

## 2019-05-11 ENCOUNTER — Other Ambulatory Visit: Payer: Self-pay

## 2019-05-11 DIAGNOSIS — C50911 Malignant neoplasm of unspecified site of right female breast: Secondary | ICD-10-CM | POA: Diagnosis not present

## 2019-05-11 DIAGNOSIS — C50411 Malignant neoplasm of upper-outer quadrant of right female breast: Secondary | ICD-10-CM | POA: Diagnosis not present

## 2019-05-11 DIAGNOSIS — Z171 Estrogen receptor negative status [ER-]: Secondary | ICD-10-CM | POA: Insufficient documentation

## 2019-05-11 MED ORDER — GADOBUTROL 1 MMOL/ML IV SOLN
10.0000 mL | Freq: Once | INTRAVENOUS | Status: AC | PRN
  Administered 2019-05-11: 09:00:00 10 mL via INTRAVENOUS

## 2019-05-12 ENCOUNTER — Inpatient Hospital Stay: Payer: Medicare Other

## 2019-05-12 ENCOUNTER — Ambulatory Visit
Admission: RE | Admit: 2019-05-12 | Discharge: 2019-05-12 | Disposition: A | Discharge status: Home / Self Care | Payer: Medicare Other | Source: Ambulatory Visit | Attending: General Surgery | Admitting: General Surgery

## 2019-05-12 ENCOUNTER — Encounter: Payer: Self-pay | Admitting: Diagnostic Radiology

## 2019-05-12 DIAGNOSIS — E119 Type 2 diabetes mellitus without complications: Secondary | ICD-10-CM | POA: Insufficient documentation

## 2019-05-12 DIAGNOSIS — Z885 Allergy status to narcotic agent status: Secondary | ICD-10-CM | POA: Diagnosis not present

## 2019-05-12 DIAGNOSIS — Z170 Estrogen receptor positive status [ER+]: Secondary | ICD-10-CM | POA: Insufficient documentation

## 2019-05-12 DIAGNOSIS — Z87891 Personal history of nicotine dependence: Secondary | ICD-10-CM | POA: Diagnosis not present

## 2019-05-12 DIAGNOSIS — C50411 Malignant neoplasm of upper-outer quadrant of right female breast: Secondary | ICD-10-CM | POA: Diagnosis not present

## 2019-05-12 DIAGNOSIS — G500 Trigeminal neuralgia: Secondary | ICD-10-CM | POA: Diagnosis not present

## 2019-05-12 DIAGNOSIS — C50911 Malignant neoplasm of unspecified site of right female breast: Secondary | ICD-10-CM | POA: Diagnosis not present

## 2019-05-12 DIAGNOSIS — M810 Age-related osteoporosis without current pathological fracture: Secondary | ICD-10-CM | POA: Diagnosis not present

## 2019-05-12 DIAGNOSIS — Z79899 Other long term (current) drug therapy: Secondary | ICD-10-CM | POA: Insufficient documentation

## 2019-05-12 DIAGNOSIS — Z7984 Long term (current) use of oral hypoglycemic drugs: Secondary | ICD-10-CM | POA: Diagnosis not present

## 2019-05-12 DIAGNOSIS — Z90710 Acquired absence of both cervix and uterus: Secondary | ICD-10-CM | POA: Diagnosis not present

## 2019-05-12 DIAGNOSIS — I10 Essential (primary) hypertension: Secondary | ICD-10-CM | POA: Insufficient documentation

## 2019-05-12 DIAGNOSIS — E785 Hyperlipidemia, unspecified: Secondary | ICD-10-CM | POA: Insufficient documentation

## 2019-05-12 DIAGNOSIS — Z5111 Encounter for antineoplastic chemotherapy: Secondary | ICD-10-CM | POA: Diagnosis not present

## 2019-05-12 HISTORY — PX: IR IMAGING GUIDED PORT INSERTION: IMG5740

## 2019-05-12 LAB — CBC
HCT: 42 % (ref 36.0–46.0)
Hemoglobin: 13.3 g/dL (ref 12.0–15.0)
MCH: 26.2 pg (ref 26.0–34.0)
MCHC: 31.7 g/dL (ref 30.0–36.0)
MCV: 82.7 fL (ref 80.0–100.0)
Platelets: 248 10*3/uL (ref 150–400)
RBC: 5.08 MIL/uL (ref 3.87–5.11)
RDW: 14.3 % (ref 11.5–15.5)
WBC: 9.5 10*3/uL (ref 4.0–10.5)
nRBC: 0 % (ref 0.0–0.2)

## 2019-05-12 LAB — APTT: aPTT: 26 seconds (ref 24–36)

## 2019-05-12 LAB — PROTIME-INR
INR: 1 (ref 0.8–1.2)
Prothrombin Time: 12.7 seconds (ref 11.4–15.2)

## 2019-05-12 MED ORDER — MIDAZOLAM HCL 2 MG/2ML IJ SOLN
INTRAMUSCULAR | Status: AC | PRN
  Administered 2019-05-12 (×3): 1 mg via INTRAVENOUS

## 2019-05-12 MED ORDER — FLUMAZENIL 0.5 MG/5ML IV SOLN
INTRAVENOUS | Status: AC
  Filled 2019-05-12: qty 5

## 2019-05-12 MED ORDER — SODIUM CHLORIDE 0.9 % IV SOLN
INTRAVENOUS | Status: DC
  Administered 2019-05-12: 11:00:00 via INTRAVENOUS

## 2019-05-12 MED ORDER — CEFAZOLIN SODIUM-DEXTROSE 2-4 GM/100ML-% IV SOLN
2.0000 g | Freq: Once | INTRAVENOUS | Status: AC
  Administered 2019-05-12: 2 g via INTRAVENOUS

## 2019-05-12 MED ORDER — MIDAZOLAM HCL 5 MG/5ML IJ SOLN
INTRAMUSCULAR | Status: AC
  Filled 2019-05-12: qty 5

## 2019-05-12 MED ORDER — NALOXONE HCL 2 MG/2ML IJ SOSY
PREFILLED_SYRINGE | INTRAMUSCULAR | Status: AC
  Filled 2019-05-12: qty 2

## 2019-05-12 MED ORDER — HEPARIN SOD (PORK) LOCK FLUSH 100 UNIT/ML IV SOLN
INTRAVENOUS | Status: AC
  Filled 2019-05-12: qty 5

## 2019-05-12 MED ORDER — FENTANYL CITRATE (PF) 100 MCG/2ML IJ SOLN
INTRAMUSCULAR | Status: AC
  Filled 2019-05-12: qty 4

## 2019-05-12 MED ORDER — FENTANYL CITRATE (PF) 100 MCG/2ML IJ SOLN
INTRAMUSCULAR | Status: AC | PRN
  Administered 2019-05-12 (×2): 50 ug via INTRAVENOUS

## 2019-05-12 NOTE — Discharge Instructions (Signed)
Tunneled Catheter Insertion, Care After °This sheet gives you information about how to care for yourself after your procedure. Your health care provider may also give you more specific instructions. If you have problems or questions, contact your health care provider. °What can I expect after the procedure? °After the procedure, it is common to have: °· Some mild redness, bruising, swelling, and pain around your catheter site. °· A small amount of blood or clear fluid coming from your incisions. °Follow these instructions at home: °Incision care ° °· Follow instructions from your health care provider about how to take care of your incisions. Make sure you: °? Wash your hands with soap and water before and after you change your bandages (dressings). If soap and water are not available, use hand sanitizer. °? Change your dressings as told by your health care provider. Wash the area around your incisions with a germ-killing (antiseptic) solution when you change your dressings. °? Leave stitches (sutures), skin glue, or adhesive strips in place. These skin closures may need to stay in place for 2 weeks or longer. If adhesive strip edges start to loosen and curl up, you may trim the loose edges. Do not remove adhesive strips completely unless your health care provider tells you to do that. °· Keep your dressings clean and dry. °· Check your incision areas every day for signs of infection. Check for: °? More redness, swelling, or pain. °? More fluid or blood. °? Warmth. °? Pus or a bad smell. °Catheter care ° °· Wash your hands with soap and water before and after caring for your catheter. If soap and water are not available, use hand sanitizer. °· Keep your catheter site clean and dry. °· Apply an antibiotic ointment to your catheter site as told by your health care provider. °· Flush your catheter as told by your health care provider. This helps prevent it from becoming clogged. °· Do not open the caps on the ends of  the catheter. °· Do not pull on your catheter. °Medicines °· Take over-the-counter and prescription medicines only as told by your health care provider. °· If you were prescribed an antibiotic medicine, take it as told by your health care provider. Do not stop taking the antibiotic even if you start to feel better. °Activity °· Return to your normal activities as told by your health care provider. Ask your health care provider what activities are safe for you. °· Follow any other activity restrictions as instructed by your health care provider. °· Do not lift anything that is heavier than 10 lb (4.5 kg), or the limit that you are told, until your health care provider says that it is safe. °Driving °· Do not drive until your health care provider approves. °· Ask your health care provider if the medicine prescribed to you requires you to avoid driving or using heavy machinery. °General instructions °· Follow your health care provider's specific instructions for the type of catheter that you have. °· Do not take baths, swim, or use a hot tub until your health care provider approves. Ask your health care provider if you may take showers. °· Keep all follow-up visits as told by your health care provider. This is important. °Contact a health care provider if: °· You feel unusually weak or nauseous. °· You have more redness, swelling, or pain at your incisions or around the area where your catheter is inserted. °· Your catheter is not working properly. °· You are unable to flush your catheter. °  Get help right away if: °· Your catheter develops a hole or it breaks. °· You have pain or swelling when fluids or medicines are being given through the catheter. °· Fluid is leaking from the catheter, under the dressing, or around the dressing. °· Your catheter comes loose or gets pulled completely out. If this happens, press on your catheter site firmly with a clean cloth until you can get medical help. °· You have swelling in  your shoulder, neck, chest, or face. °· You have chest pain or difficulty breathing. °· You feel dizzy or light-headed. °· You have pus or a bad smell coming from your catheter site. °· You have a fever or chills. °· Your catheter site feels warm to the touch. °· You develop bleeding from your catheter or your insertion site, and your bleeding does not stop. °Summary °· After the procedure, it is common to have mild redness, swelling, and pain around your catheter site. °· Return to your normal activities as told by your health care provider. Ask your health care provider what activities are safe for you. °· Follow your health care provider's specific instructions for the type of catheter that you have. °· Keep your catheter site and your dressings clean and dry. °· Contact a health care provider if your catheter is not working properly. Get help right away if you have chest pain, fever, or difficulty breathing. °This information is not intended to replace advice given to you by your health care provider. Make sure you discuss any questions you have with your health care provider. °Document Released: 08/19/2012 Document Revised: 08/25/2018 Document Reviewed: 08/25/2018 °Elsevier Patient Education © 2020 Elsevier Inc. ° °

## 2019-05-12 NOTE — Procedures (Signed)
Interventional Radiology Procedure:   Indications: Right breast cancer  Procedure: Port placement  Findings: Left jugular port, tip at SVC/RA junction  Complications: None     EBL: Minimal, less than 10 ml  Plan: Discharge in one hour.  Keep port site and incisions dry for at least 24 hours.     Dannie Woolen R. Maxey Ransom, MD  Pager: [phone]    

## 2019-05-12 NOTE — Consult Note (Signed)
Chief Complaint: Patient was seen in consultation today for Port-A-Cath placement at the request of Toth,Paul III  Referring Physician(s): Toth,Paul III  Patient Status: [facility] - Out-pt  History of Present Illness: Nichole Martinez is a 68 y.o. 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  Patient is scheduled for neoadjuvant chemotherapy and requires a Port-A-Cath.  Patient is asymptomatic at this time.  Patient is a retired Warden/ranger and very knowledgeable about ports .  Past Medical History:  Diagnosis Date   Allergy    Breast cancer, right ([place]) 2020   Currently taking hormonal chemo tx's.    Diabetes mellitus without complication ([place]) 1610   Hyperlipidemia    Hypertension    Osteoporosis    Trigeminal neuralgia     Past Surgical History:  Procedure Laterality Date   ABDOMINAL HYSTERECTOMY  1978   BREAST BIOPSY Right 01/06/2019   [place] bx x 2.  Ribbon marker for mass and hydro for Lymph node. Path pending   BREAST CYST EXCISION Right 1986   BREAST EXCISIONAL BIOPSY     LAMINECTOMY  1995   L1, L2, L3   SPINE SURGERY      Allergies: Morphine  Medications: Prior to Admission medications   Medication Sig Start Date End Date Taking? Authorizing Provider  ALPRAZolam Duanne Moron) 1 MG tablet TAKE ONE TABLET BY MOUTH AT BEDTIME AS NEEDED FOR ANXIETY 03/29/19  Yes [name], PA-C  amLODipine (NORVASC) 5 MG tablet Take 1 tablet (5 mg total) by mouth daily. 03/29/19  Yes [name], PA-C  baclofen (LIORESAL) 10 MG tablet Take 1 tablet (10 mg total) by mouth daily. 03/29/19  Yes [name], PA-C  bisoprolol-hydrochlorothiazide (ZIAC) 10-6.25 MG tablet Take 1 tablet by mouth 2 (two) times daily. 03/29/19  Yes [name], PA-C  buPROPion (WELLBUTRIN XL) 150 MG 24 hr tablet Take 1 tablet (150 mg total) by mouth 2 (two) times a day. 04/12/19  Yes [name], PA-C  Cholecalciferol (VITAMIN D3) 5000 units CAPS Take by mouth.   Yes [provider]  letrozole (FEMARA) 2.5 MG tablet Take 1 tablet (2.5 mg total) by mouth daily. 01/13/19  Yes Lloyd Huger, MD  lidocaine-prilocaine (EMLA) cream Apply to affected area once 04/29/19  Yes Finnegan, Kathlene November, MD  lisinopril-hydrochlorothiazide (ZESTORETIC) 10-12.5 MG tablet Take 1 tablet by mouth daily. 03/29/19  Yes [name], PA-C  loratadine (CLARITIN) 10 MG tablet Take 1 tablet (10 mg total) by mouth daily as needed. 03/29/19  Yes [name], PA-C  meloxicam (MOBIC) 15 MG tablet Take 1 tablet (15 mg total) by mouth daily. 03/29/19  Yes Fenton Malling M, PA-C  metFORMIN (GLUCOPHAGE) 1000 MG tablet Take 1 tablet (1,000 mg total) by mouth 2 (two) times daily with a meal. 03/29/19  Yes Burnette, Clearnce Sorrel, PA-C  MULTIPLE VITAMINS PO MULTIVITAMINS (Oral Tablet)  1 Every Day for 0 days  Quantity: 0.00;  Refills: 0   Ordered :17-Apr-2011  [name] ;  Started 24-February-2009 Active 02/24/09  Yes [provider]  OMEGA 3-6-9 FATTY ACIDS PO Reported on 10/04/2015 02/24/09  Yes [provider]  ondansetron (ZOFRAN) 8 MG tablet Take 1 tablet (8 mg total) by mouth 2 (two) times daily as needed. 04/29/19  Yes Lloyd Huger, MD  prochlorperazine (COMPAZINE) 10 MG tablet Take 1 tablet (10 mg total) by mouth every 6 (six) hours as  needed (Nausea or vomiting). 04/29/19  Yes Finnegan, Kathlene November, MD  rizatriptan (MAXALT) 5 MG tablet TAKE 1 TABLET BY MOUTH AS NEEDED FOR MIGRAINE. MAY REPEAT IN 2 HOURS IF NEEDED. 03/31/19  Yes Burnette, Clearnce Sorrel, PA-C  simvastatin (ZOCOR) 20 MG tablet Take 1 tablet (20 mg total) by mouth at bedtime. 03/29/19  Yes [name], PA-C  Vitamin D, Ergocalciferol, (DRISDOL) 1.25 MG (50000 UT) CAPS capsule Take 1 capsule (50,000 Units total) by mouth every 7 (seven) days. 03/29/19  Yes [name], PA-C  vitamin E 1000 UNIT  capsule Take 1,000 Units by mouth daily.   Yes [provider]     Family History  Adopted: Yes  Family history unknown: Yes    Social History   Socioeconomic History   Marital status: Divorced    Spouse name: Not on file   Number of children: 1   Years of education: Not on file   Highest education level: Not on file  Occupational History   Occupation: Programmer, multimedia: [employer] resource strain: Not on file   Food insecurity    Worry: Not on file    Inability: Not on file   Transportation needs    Medical: Not on file    Non-medical: Not on file  Tobacco Use   Smoking status: Former Smoker    Quit date: 03/16/2000    Years since quitting: 19.1   Smokeless tobacco: Never Used  Substance and Sexual Activity   Alcohol use: No   Drug use: No   Sexual activity: Not on file  Lifestyle   Physical activity    Days per week: Not on file    Minutes per session: Not on file   Stress: Not on file  Relationships   Social connections    Talks on phone: Not on file    Gets together: Not on file    Attends religious service: Not on file    Active member of club or organization: Not on file    Attends meetings of clubs or organizations: Not on file    Relationship status: Not on file  Other Topics Concern   Not on file  Social History Narrative   Not on file     Review of Systems: A 12 point ROS discussed and pertinent positives are indicated in the HPI above.  All other systems are negative.  Review of Systems  Constitutional: Negative.   Respiratory: Negative.   Cardiovascular: Negative.   Gastrointestinal: Negative.   Genitourinary: Negative.     Vital Signs: BP 129/83    Pulse (!) 53    Temp 98.2 F (36.8 C) (Oral)    Resp 20    Ht '5\' 7"'  (1.702 m)    Wt 103.4 kg    SpO2 97%    BMI 35.71 kg/m   Physical Exam Constitutional:      Appearance: Normal appearance.  Cardiovascular:     Rate and Rhythm:  Normal rate and regular rhythm.     Pulses: Normal pulses.     Heart sounds: Normal heart sounds.  Abdominal:     General: Abdomen is flat.     Palpations: Abdomen is soft.  Neurological:     Mental Status: She is alert.     Imaging: Nm Cardiac Muga Rest  Result Date: 05/05/2019 CLINICAL DATA:  RIGHT breast cancer, pre cardiotoxic chemotherapy EXAM: NUCLEAR MEDICINE CARDIAC BLOOD POOL IMAGING (MUGA) TECHNIQUE:  Cardiac multi-gated acquisition was performed at rest following intravenous injection of Tc-50mlabeled red blood cells. RADIOPHARMACEUTICALS:  21.75 mCi [id number] in-vitro labeled red blood cells IV COMPARISON:  None FINDINGS: Calculated LEFT ventricular ejection fraction is 53%, low normal. Study was obtained at a cardiac rate of 51 bpm. Patient was rhythmic during imaging. Cine analysis of the LEFT ventricle in 3 projections demonstrates normal LV wall motion. IMPRESSION: Low normal LEFT ventricular ejection fraction of 53%. Normal LV wall motion. Electronically Signed   By: MaLavonia Dana.D.   On: 05/05/2019 14:21   Mr Breast Bilateral W Wo Contrast Inc Cad  Result Date: 05/11/2019 CLINICAL DATA:  Recent diagnosis of right breast carcinoma. Staging breast MRI. LABS:  No labs drawn at time of imaging. EXAM: BILATERAL BREAST MRI WITH AND WITHOUT CONTRAST TECHNIQUE: Multiplanar, multisequence MR images of both breasts were obtained prior to and following the intravenous administration of 10 ml of Gadavist Three-dimensional MR images were rendered by post-processing of the original MR data on an independent workstation. The three-dimensional MR images were interpreted, and findings are reported in the following complete MRI report for this study. Three dimensional images were evaluated at the independent DynaCad workstation COMPARISON:  Previous exam(s). FINDINGS: Breast composition: b. Scattered fibroglandular tissue. Background parenchymal enhancement: Mild. Right breast:  Irregular enhancing mass anterior, upper slightly inner right breast reflects the recently biopsied carcinoma, associated with susceptibility artifact from the biopsy clip. Enhancement is predominantly irregular rim enhancement. The mass measures 2.1 x 1.6 x 1.7 cm. 6 mm enhancing mass in the lateral right breast, hyperintense on T2 weighted imaging, likely an intramammary lymph node. No other left breast masses or areas of abnormal enhancement. Left breast: 7 mm enhancing mass, posterior, lateral left breast, primarily wash-in connects with a small area of washout kinetics. No other masses or areas of abnormal enhancement in the left breast. Lymph nodes: Enlarged right axillary lymph node, cortical thickness of 11 mm, containing susceptibility artifact from the biopsy clip. 5 mm short axis node posterior to this with no defined hilar fat. Borderline abnormal lymph node posterior and inferior to the biopsied node with a cortical thickness of 3-4 mm. No abnormal left axillary lymph nodes. Ancillary findings:  None. IMPRESSION: 1. Biopsy-proven right breast carcinoma measures 2.1 cm in greatest dimension on MRI. 2. Biopsy-proven right axillary metastatic adenopathy. Two adjacent abnormal/borderline abnormal lymph nodes. 3. 6 mm enhancing mass in the lateral right breast. This may reflect intramammary lymph node. Prior right breast ultrasound showed a cyst at 8 o'clock in the right breast. This is not a cyst based on MRI given its homogeneous enhancement. Recommend tissue sampling. 4. 7 mm enhancing mass, posterolateral left breast. Patient had a stable 9 mm mass on the prior ultrasound, which was in the medial left breast and does not correlate to the MRI finding. Tissue sampling recommended. RECOMMENDATION: 1. MRI guided core needle biopsy of the 6 mm mass in the right breast, image 43, MRI sequence 6, and the 7 mm enhancing mass in the left breast, image 47, series 6. BI-RADS CATEGORY  4: Suspicious. Electronically  Signed   By: DaLajean Manes.D.   On: 05/11/2019 12:30   Us[place]oreana Bx Thyroid 1st Lesion Afirma  Result Date: 04/26/2019 INDICATION: Right thyroid lobe nodule. EXAM: ULTRASOUND-DIRECTED THYROID BIOPSY MEDICATIONS: None. ANESTHESIA/SEDATION: Local 1% lidocaine. FLUOROSCOPY TIME:  Fluoroscopy Time: 0 minutes 0 seconds COMPLICATIONS: Nine. PROCEDURE: After discussing the risks and benefits of this procedure with the patient informed consent  was obtained. Right neck was sterilely prepped and draped. Following local anesthesia with 1% lidocaine, four 25 gauge needle aspiration samples obtained for pathology. No complications. Postprocedural instructions given. Follow-up with patient's physician. IMPRESSION: Successful ultrasound-directed dominant right thyroid nodule fine needle aspiration. Sample sent to pathology. Electronically Signed   By: Marcello Moores  Register   On: 04/26/2019 13:59   US Thyroid  Result Date: 04/15/2019 CLINICAL DATA:  Palpable abnormality.  Palpable thyroid nodule. EXAM: THYROID ULTRASOUND TECHNIQUE: Ultrasound examination of the thyroid gland and adjacent soft tissues was performed. COMPARISON:  None. FINDINGS: Parenchymal Echotexture: Moderately heterogenous Isthmus: Enlarged measuring 1 cm in diameter Right lobe: Borderline enlarged measuring 5.4 x 2.2 x 2.1 cm Left lobe: Enlarged measuring 6.3 x 3.1 x 3.6 cm. _________________________________________________________ Estimated total number of nodules >/= 1 cm: 1 Number of spongiform nodules >/=  2 cm not described below (TR1): 0 Number of mixed cystic and solid nodules >/= 1.5 cm not described below (TR2): 0 _________________________________________________________ There is an ill-defined approximately 1.2 x 1.1 x 0.9 cm isoechoic nodule/pseudonodule within the left-sided thyroid isthmus (labeled 1), which does not meet imaging criteria to recommend percutaneous sampling or continued dedicated follow-up.  _________________________________________________________ There is an approximately 0.7 x 0.7 x 0.6 cm hypoechoic nodule within the superior pole of the right lobe of the thyroid (labeled 2), which does not meet imaging criteria to recommend percutaneous sampling or continued dedicated follow-up There is an approximately 0.6 x 0.6 x 0.5 cm anechoic cyst within the mid aspect the right lobe of the thyroid (labeled 3), which contains a echogenic foci with ring down artifact compatible with benign colloid. This benign colloid containing cyst does not meet imaging criteria to recommend percutaneous sampling or continued dedicated follow-up There is an approximately 0.9 x 0.9 x 0.8 cm spongiform/benign-appearing nodule within the mid aspect the right lobe of the thyroid (labeled 4), which does not meet imaging criteria to recommend percutaneous sampling or continued dedicated follow-up. _________________________________________________________ Nodule # 5: Location: Right; Inferior Maximum size: 2.0 cm; Other 2 dimensions: 2.0 x 1.2 cm Composition: solid/almost completely solid (2) Echogenicity: isoechoic (1) Shape: not taller-than-wide (0) Margins: ill-defined (0) Echogenic foci: none (0) ACR TI-RADS total points: 3. ACR TI-RADS risk category: TR3 (3 points). ACR TI-RADS recommendations: **Given size (>/= 2.5 cm) and appearance, fine needle aspiration of this mildly suspicious nodule should be considered based on TI-RADS criteria. _________________________________________________________ IMPRESSION: 1. Borderline thyromegaly with findings suggestive of multinodular goiter. 2. Nodule #5 within the right lobe of the thyroid meets imaging criteria to recommend percutaneous sampling. 3. None of the remaining nodules/pseudo nodules meet imaging criteria to recommend percutaneous sampling or continued dedicated follow-up. The above is in keeping with the ACR TI-RADS recommendations - J Am Coll Radiol 2017;14:587-595.  Electronically Signed   By: Sandi Mariscal M.D.   On: 04/15/2019 17:05    Labs:  CBC: Recent Labs    03/29/19 0928 05/12/19 1050  WBC 8.9 9.5  HGB 13.6 13.3  HCT 43.3 42.0  PLT 257 248    COAGS: Recent Labs    05/12/19 1050  INR 1.0  APTT 26    BMP: Recent Labs    02/03/19 0959 03/17/19 0958 03/29/19 0928  NA 137  --  139  K 4.3  --  4.5  CL 104  --  106  CO2 25  --  21  GLUCOSE 145*  --  75  BUN 28*  --  21  CALCIUM 9.2  --  9.3  CREATININE 0.75 0.70 0.96  GFRNONAA >60  --  62  GFRAA >60  --  72    LIVER FUNCTION TESTS: Recent Labs    02/03/19 0959 03/29/19 0928  BILITOT 0.6 0.3  AST 18 15  ALT 16 13  ALKPHOS 59 48  PROT 7.0 5.8*  ALBUMIN 4.0 4.0    TUMOR MARKERS: No results for input(s): AFPTM, CEA, CA199, CHROMGRNA in the last 8760 hours.  Assessment and Plan:  68 year old with right breast cancer and needs a Port-A-Cath for neoadjuvant chemotherapy.  Risks and benefits of image guided port-a-catheter placement was discussed with the patient including, but not limited to bleeding, infection, pneumothorax, or fibrin sheath development and need for additional procedures.  All of the patient's questions were answered, patient is agreeable to proceed. Consent signed and in chart.   Thank you for this interesting consult.  I greatly enjoyed meeting ASHLEN KIGER and look forward to participating in their care.  A copy of this report was sent to the requesting provider on this date.  Electronically Signed: Burman Riis, MD 05/12/2019, 11:24 AM   I spent a total of  15 Minutes   in face to face in clinical consultation, greater than 50% of which was counseling/coordinating care for port placement.

## 2019-05-13 ENCOUNTER — Inpatient Hospital Stay: Payer: Medicare Other

## 2019-05-13 ENCOUNTER — Other Ambulatory Visit: Payer: Self-pay

## 2019-05-13 ENCOUNTER — Encounter: Payer: Self-pay | Admitting: Oncology

## 2019-05-13 ENCOUNTER — Inpatient Hospital Stay (HOSPITAL_BASED_OUTPATIENT_CLINIC_OR_DEPARTMENT_OTHER): Payer: Medicare Other | Admitting: Oncology

## 2019-05-13 VITALS — BP 134/88 | HR 55 | Temp 96.7°F | Resp 18 | Wt 228.0 lb

## 2019-05-13 DIAGNOSIS — C50111 Malignant neoplasm of central portion of right female breast: Secondary | ICD-10-CM

## 2019-05-13 DIAGNOSIS — I10 Essential (primary) hypertension: Secondary | ICD-10-CM | POA: Diagnosis not present

## 2019-05-13 DIAGNOSIS — Z95828 Presence of other vascular implants and grafts: Secondary | ICD-10-CM

## 2019-05-13 DIAGNOSIS — Z79899 Other long term (current) drug therapy: Secondary | ICD-10-CM | POA: Diagnosis not present

## 2019-05-13 DIAGNOSIS — Z170 Estrogen receptor positive status [ER+]: Secondary | ICD-10-CM | POA: Diagnosis not present

## 2019-05-13 DIAGNOSIS — E119 Type 2 diabetes mellitus without complications: Secondary | ICD-10-CM | POA: Diagnosis not present

## 2019-05-13 DIAGNOSIS — C773 Secondary and unspecified malignant neoplasm of axilla and upper limb lymph nodes: Secondary | ICD-10-CM | POA: Diagnosis not present

## 2019-05-13 DIAGNOSIS — E041 Nontoxic single thyroid nodule: Secondary | ICD-10-CM | POA: Diagnosis not present

## 2019-05-13 DIAGNOSIS — Z87891 Personal history of nicotine dependence: Secondary | ICD-10-CM | POA: Diagnosis not present

## 2019-05-13 DIAGNOSIS — Z5189 Encounter for other specified aftercare: Secondary | ICD-10-CM | POA: Diagnosis not present

## 2019-05-13 DIAGNOSIS — Z5111 Encounter for antineoplastic chemotherapy: Secondary | ICD-10-CM | POA: Diagnosis not present

## 2019-05-13 LAB — COMPREHENSIVE METABOLIC PANEL
ALT: 16 U/L (ref 0–44)
AST: 19 U/L (ref 15–41)
Albumin: 3.5 g/dL (ref 3.5–5.0)
Alkaline Phosphatase: 51 U/L (ref 38–126)
Anion gap: 7 (ref 5–15)
BUN: 24 mg/dL — ABNORMAL HIGH (ref 8–23)
CO2: 25 mmol/L (ref 22–32)
Calcium: 8.8 mg/dL — ABNORMAL LOW (ref 8.9–10.3)
Chloride: 104 mmol/L (ref 98–111)
Creatinine, Ser: 0.81 mg/dL (ref 0.44–1.00)
GFR calc Af Amer: >60 mL/min (ref >60)
GFR calc non Af Amer: >60 mL/min (ref >60)
Glucose, Bld: 171 mg/dL — ABNORMAL HIGH (ref 70–99)
Potassium: 4.6 mmol/L (ref 3.5–5.1)
Sodium: 136 mmol/L (ref 135–145)
Total Bilirubin: 0.4 mg/dL (ref 0.3–1.2)
Total Protein: 6.2 g/dL — ABNORMAL LOW (ref 6.5–8.1)

## 2019-05-13 LAB — CBC WITH DIFFERENTIAL/PLATELET
Abs Immature Granulocytes: 0.02 10*3/uL (ref 0.00–0.07)
Basophils Absolute: 0.1 10*3/uL (ref 0.0–0.1)
Basophils Relative: 1 %
Eosinophils Absolute: 1.5 10*3/uL — ABNORMAL HIGH (ref 0.0–0.5)
Eosinophils Relative: 16 %
HCT: 38.9 % (ref 36.0–46.0)
Hemoglobin: 12.4 g/dL (ref 12.0–15.0)
Immature Granulocytes: 0 %
Lymphocytes Relative: 24 %
Lymphs Abs: 2.3 10*3/uL (ref 0.7–4.0)
MCH: 26.2 pg (ref 26.0–34.0)
MCHC: 31.9 g/dL (ref 30.0–36.0)
MCV: 82.1 fL (ref 80.0–100.0)
Monocytes Absolute: 0.7 10*3/uL (ref 0.1–1.0)
Monocytes Relative: 7 %
Neutro Abs: 5 10*3/uL (ref 1.7–7.7)
Neutrophils Relative %: 52 %
Platelets: 215 10*3/uL (ref 150–400)
RBC: 4.74 MIL/uL (ref 3.87–5.11)
RDW: 14.3 % (ref 11.5–15.5)
WBC: 9.6 10*3/uL (ref 4.0–10.5)
nRBC: 0 % (ref 0.0–0.2)

## 2019-05-13 MED ORDER — HEPARIN SOD (PORK) LOCK FLUSH 100 UNIT/ML IV SOLN
500.0000 [IU] | Freq: Once | INTRAVENOUS | Status: AC | PRN
  Administered 2019-05-13: 12:00:00 500 [IU]
  Filled 2019-05-13: qty 5

## 2019-05-13 MED ORDER — SODIUM CHLORIDE 0.9% FLUSH
10.0000 mL | Freq: Once | INTRAVENOUS | Status: AC
  Administered 2019-05-13: 10 mL via INTRAVENOUS
  Filled 2019-05-13: qty 10

## 2019-05-13 MED ORDER — SODIUM CHLORIDE 0.9 % IV SOLN
600.0000 mg/m2 | Freq: Once | INTRAVENOUS | Status: AC
  Administered 2019-05-13: 11:00:00 1320 mg via INTRAVENOUS
  Filled 2019-05-13: qty 50

## 2019-05-13 MED ORDER — SODIUM CHLORIDE 0.9 % IV SOLN
Freq: Once | INTRAVENOUS | Status: AC
  Administered 2019-05-13: 10:00:00 via INTRAVENOUS
  Filled 2019-05-13: qty 250

## 2019-05-13 MED ORDER — SODIUM CHLORIDE 0.9 % IV SOLN
Freq: Once | INTRAVENOUS | Status: AC
  Administered 2019-05-13: 10:00:00 via INTRAVENOUS
  Filled 2019-05-13: qty 5

## 2019-05-13 MED ORDER — PALONOSETRON HCL INJECTION 0.25 MG/5ML
0.2500 mg | Freq: Once | INTRAVENOUS | Status: AC
  Administered 2019-05-13: 0.25 mg via INTRAVENOUS
  Filled 2019-05-13: qty 5

## 2019-05-13 MED ORDER — DOXORUBICIN HCL CHEMO IV INJECTION 2 MG/ML
60.0000 mg/m2 | Freq: Once | INTRAVENOUS | Status: AC
  Administered 2019-05-13: 11:00:00 132 mg via INTRAVENOUS
  Filled 2019-05-13: qty 66

## 2019-05-13 NOTE — Progress Notes (Signed)
Patient here today for follow and treatment consideration regarding breast cancer, first treatment today. Patient denies concerns.

## 2019-05-13 NOTE — Progress Notes (Signed)
[place]  [phone] [phone]  ID: Nichole Martinez OB: [birth year]  MR#: [id number]  ZSW#:[id number]  Patient Care Team: [name], PA-C as PCP - General (Family Medicine)   CHIEF COMPLAINT: Clinical stage IIa ER/PR positive, HER-2 negative invasive carcinoma of the central portion of the right breast.  INTERVAL HISTORY: Patient returns to clinic today for further evaluation and initiation of cycle 1 of 4 of Adriamycin and Cytoxan.  She had a port placed yesterday without incident. She currently feels well and is asymptomatic. She has no neurologic complaints.  She denies any recent fevers or illnesses.  She has a good appetite and denies weight loss.  She denies any chest pain, shortness of breath, cough, or hemoptysis.  She denies any nausea, vomiting, constipation, or diarrhea.  She has no urinary complaints.  Patient feels at her baseline offers no specific complaints today.  REVIEW OF SYSTEMS:   Review of Systems  Constitutional: Negative.  Negative for fever, malaise/fatigue and weight loss.  Respiratory: Negative.  Negative for cough, hemoptysis and shortness of breath.   Cardiovascular: Negative.  Negative for chest pain and leg swelling.  Gastrointestinal: Negative.  Negative for abdominal pain, diarrhea and nausea.  Genitourinary: Negative.  Negative for dysuria.  Musculoskeletal: Negative.  Negative for back pain and myalgias.  Skin: Negative.  Negative for rash.  Neurological: Negative.  Negative for focal weakness, weakness and headaches.  Psychiatric/Behavioral: The patient is nervous/anxious.     As per HPI. Otherwise, a complete review of systems is negative.  PAST MEDICAL HISTORY: Past Medical History:  Diagnosis Date   Allergy    Breast cancer, right ([place]) 2020   Currently taking hormonal chemo tx's.    Diabetes mellitus without complication (Nichole Martinez) 3220   Hyperlipidemia    Hypertension    Osteoporosis     Trigeminal neuralgia     PAST SURGICAL HISTORY: Past Surgical History:  Procedure Laterality Date   ABDOMINAL HYSTERECTOMY  1978   BREAST BIOPSY Right 01/06/2019   [place] bx x 2.  Ribbon marker for mass and hydro for Lymph node. Path pending   BREAST CYST EXCISION Right 1986   BREAST EXCISIONAL BIOPSY     IR IMAGING GUIDED PORT INSERTION  05/12/2019   LAMINECTOMY  1995   L1, L2, L3   SPINE SURGERY      FAMILY HISTORY: Family History  Adopted: Yes  Family history unknown: Yes    ADVANCED DIRECTIVES (Y/N):  N  HEALTH MAINTENANCE: Social History   Tobacco Use   Smoking status: Former Smoker    Quit date: 03/16/2000    Years since quitting: 19.1   Smokeless tobacco: Never Used  Substance Use Topics   Alcohol use: No   Drug use: No     Colonoscopy:  PAP:  Bone density:  Lipid panel:  Allergies  Allergen Reactions   Morphine     Anaphallaxis.    Current Outpatient Medications  Medication Sig Dispense Refill   ALPRAZolam (XANAX) 1 MG tablet TAKE ONE TABLET BY MOUTH AT BEDTIME AS NEEDED FOR ANXIETY 90 tablet 1   amLODipine (NORVASC) 5 MG tablet Take 1 tablet (5 mg total) by mouth daily. 90 tablet 3   baclofen (LIORESAL) 10 MG tablet Take 1 tablet (10 mg total) by mouth daily. 90 tablet 3   bisoprolol-hydrochlorothiazide (ZIAC) 10-6.25 MG tablet Take 1 tablet by mouth 2 (two) times daily. 180 tablet 3   buPROPion (WELLBUTRIN XL) 150 MG 24 hr tablet Take  1 tablet (150 mg total) by mouth 2 (two) times a day. 180 tablet 1   Cholecalciferol (VITAMIN D3) 5000 units CAPS Take by mouth.     letrozole (FEMARA) 2.5 MG tablet Take 1 tablet (2.5 mg total) by mouth daily. 90 tablet 3   lidocaine-prilocaine (EMLA) cream Apply to affected area once 30 g 3   lisinopril-hydrochlorothiazide (ZESTORETIC) 10-12.5 MG tablet Take 1 tablet by mouth daily. 90 tablet 3   loratadine (CLARITIN) 10 MG tablet Take 1 tablet (10 mg total) by mouth daily as needed. 90 tablet 3    meloxicam (MOBIC) 15 MG tablet Take 1 tablet (15 mg total) by mouth daily. 90 tablet 3   metFORMIN (GLUCOPHAGE) 1000 MG tablet Take 1 tablet (1,000 mg total) by mouth 2 (two) times daily with a meal. 180 tablet 3   MULTIPLE VITAMINS PO MULTIVITAMINS (Oral Tablet)  1 Every Day for 0 days  Quantity: 0.00;  Refills: 0   Ordered :17-Apr-2011  Nichole Martinez ;  Started 24-February-2009 Active     OMEGA 3-6-9 FATTY ACIDS PO Reported on 10/04/2015     ondansetron (ZOFRAN) 8 MG tablet Take 1 tablet (8 mg total) by mouth 2 (two) times daily as needed. 30 tablet 2   prochlorperazine (COMPAZINE) 10 MG tablet Take 1 tablet (10 mg total) by mouth every 6 (six) hours as needed (Nausea or vomiting). 60 tablet 2   rizatriptan (MAXALT) 5 MG tablet TAKE 1 TABLET BY MOUTH AS NEEDED FOR MIGRAINE. MAY REPEAT IN 2 HOURS IF NEEDED. 10 tablet 0   simvastatin (ZOCOR) 20 MG tablet Take 1 tablet (20 mg total) by mouth at bedtime. 90 tablet 3   Vitamin D, Ergocalciferol, (DRISDOL) 1.25 MG (50000 UT) CAPS capsule Take 1 capsule (50,000 Units total) by mouth every 7 (seven) days. 12 capsule 4   vitamin E 1000 UNIT capsule Take 1,000 Units by mouth daily.     No current facility-administered medications for this visit.     OBJECTIVE: Vitals:   05/13/19 0903  BP: 134/88  Pulse: (!) 55  Resp: 18  Temp: (!) 96.7 F (35.9 C)     Body mass index is 35.71 kg/m.    ECOG FS:0 - Asymptomatic  General: Well-developed, well-nourished, no acute distress. Eyes: Pink conjunctiva, anicteric sclera. HEENT: Normocephalic, moist mucous membranes. Breast: Exam deferred today. Lungs: Clear to auscultation bilaterally. Heart: Regular rate and rhythm. No rubs, murmurs, or gallops. Abdomen: Soft, nontender, nondistended. No organomegaly noted, normoactive bowel sounds. Musculoskeletal: No edema, cyanosis, or clubbing. Neuro: Alert, answering all questions appropriately. Cranial nerves grossly intact. Skin: No rashes or petechiae  noted. Psych: Normal affect.  LAB RESULTS:  Lab Results  Component Value Date   NA 136 05/13/2019   K 4.6 05/13/2019   CL 104 05/13/2019   CO2 25 05/13/2019   GLUCOSE 171 (H) 05/13/2019   BUN 24 (H) 05/13/2019   CREATININE 0.81 05/13/2019   CALCIUM 8.8 (L) 05/13/2019   PROT 6.2 (L) 05/13/2019   ALBUMIN 3.5 05/13/2019   AST 19 05/13/2019   ALT 16 05/13/2019   ALKPHOS 51 05/13/2019   BILITOT 0.4 05/13/2019   GFRNONAA >60 05/13/2019   GFRAA >60 05/13/2019    Lab Results  Component Value Date   WBC 9.6 05/13/2019   NEUTROABS 5.0 05/13/2019   HGB 12.4 05/13/2019   HCT 38.9 05/13/2019   MCV 82.1 05/13/2019   PLT 215 05/13/2019     STUDIES: Nm Cardiac Muga Rest  Result Date: 05/05/2019 CLINICAL  DATA:  RIGHT breast cancer, pre cardiotoxic chemotherapy EXAM: NUCLEAR MEDICINE CARDIAC BLOOD POOL IMAGING (MUGA) TECHNIQUE: Cardiac multi-gated acquisition was performed at rest following intravenous injection of Tc-36mlabeled red blood cells. RADIOPHARMACEUTICALS:  21.75 mCi Tc-93mertechnetate in-vitro labeled red blood cells IV COMPARISON:  None FINDINGS: Calculated LEFT ventricular ejection fraction is 53%, low normal. Study was obtained at a cardiac rate of 51 bpm. Patient was rhythmic during imaging. Cine analysis of the LEFT ventricle in 3 projections demonstrates normal LV wall motion. IMPRESSION: Low normal LEFT ventricular ejection fraction of 53%. Normal LV wall motion. Electronically Signed   By: MaLavonia Dana.D.   On: 05/05/2019 14:21   Mr Breast Bilateral W Wo Contrast Inc Cad  Result Date: 05/11/2019 CLINICAL DATA:  Recent diagnosis of right breast carcinoma. Staging breast MRI. LABS:  No labs drawn at time of imaging. EXAM: BILATERAL BREAST MRI WITH AND WITHOUT CONTRAST TECHNIQUE: Multiplanar, multisequence MR images of both breasts were obtained prior to and following the intravenous administration of 10 ml of Gadavist Three-dimensional MR images were rendered by  post-processing of the original MR data on an independent workstation. The three-dimensional MR images were interpreted, and findings are reported in the following complete MRI report for this study. Three dimensional images were evaluated at the independent DynaCad workstation COMPARISON:  Previous exam(s). FINDINGS: Breast composition: b. Scattered fibroglandular tissue. Background parenchymal enhancement: Mild. Right breast: Irregular enhancing mass anterior, upper slightly inner right breast reflects the recently biopsied carcinoma, associated with susceptibility artifact from the biopsy clip. Enhancement is predominantly irregular rim enhancement. The mass measures 2.1 x 1.6 x 1.7 cm. 6 mm enhancing mass in the lateral right breast, hyperintense on T2 weighted imaging, likely an intramammary lymph node. No other left breast masses or areas of abnormal enhancement. Left breast: 7 mm enhancing mass, posterior, lateral left breast, primarily wash-in connects with a small area of washout kinetics. No other masses or areas of abnormal enhancement in the left breast. Lymph nodes: Enlarged right axillary lymph node, cortical thickness of 11 mm, containing susceptibility artifact from the biopsy clip. 5 mm short axis node posterior to this with no defined hilar fat. Borderline abnormal lymph node posterior and inferior to the biopsied node with a cortical thickness of 3-4 mm. No abnormal left axillary lymph nodes. Ancillary findings:  None. IMPRESSION: 1. Biopsy-proven right breast carcinoma measures 2.1 cm in greatest dimension on MRI. 2. Biopsy-proven right axillary metastatic adenopathy. Two adjacent abnormal/borderline abnormal lymph nodes. 3. 6 mm enhancing mass in the lateral right breast. This may reflect intramammary lymph node. Prior right breast ultrasound showed a cyst at 8 o'clock in the right breast. This is not a cyst based on MRI given its homogeneous enhancement. Recommend tissue sampling. 4. 7 mm  enhancing mass, posterolateral left breast. Patient had a stable 9 mm mass on the prior ultrasound, which was in the medial left breast and does not correlate to the MRI finding. Tissue sampling recommended. RECOMMENDATION: 1. MRI guided core needle biopsy of the 6 mm mass in the right breast, image 43, MRI sequence 6, and the 7 mm enhancing mass in the left breast, image 47, series 6. BI-RADS CATEGORY  4: Suspicious. Electronically Signed   By: DaLajean Manes.D.   On: 05/11/2019 12:30   [place] Bx Thyroid 1st Lesion Afirma  Result Date: 04/26/2019 INDICATION: Right thyroid lobe nodule. EXAM: ULTRASOUND-DIRECTED THYROID BIOPSY MEDICATIONS: None. ANESTHESIA/SEDATION: Local 1% lidocaine. FLUOROSCOPY TIME:  Fluoroscopy Time: 0 minutes 0 seconds  COMPLICATIONS: Nine. PROCEDURE: After discussing the risks and benefits of this procedure with the patient informed consent was obtained. Right neck was sterilely prepped and draped. Following local anesthesia with 1% lidocaine, four 25 gauge needle aspiration samples obtained for pathology. No complications. Postprocedural instructions given. Follow-up with patient's physician. IMPRESSION: Successful ultrasound-directed dominant right thyroid nodule fine needle aspiration. Sample sent to pathology. Electronically Signed   By: Marcello Moores  Register   On: 04/26/2019 13:59   US Thyroid  Result Date: 04/15/2019 CLINICAL DATA:  Palpable abnormality.  Palpable thyroid nodule. EXAM: THYROID ULTRASOUND TECHNIQUE: Ultrasound examination of the thyroid gland and adjacent soft tissues was performed. COMPARISON:  None. FINDINGS: Parenchymal Echotexture: Moderately heterogenous Isthmus: Enlarged measuring 1 cm in diameter Right lobe: Borderline enlarged measuring 5.4 x 2.2 x 2.1 cm Left lobe: Enlarged measuring 6.3 x 3.1 x 3.6 cm. _________________________________________________________ Estimated total number of nodules >/= 1 cm: 1 Number of spongiform nodules >/=  2 cm not described  below (TR1): 0 Number of mixed cystic and solid nodules >/= 1.5 cm not described below (TR2): 0 _________________________________________________________ There is an ill-defined approximately 1.2 x 1.1 x 0.9 cm isoechoic nodule/pseudonodule within the left-sided thyroid isthmus (labeled 1), which does not meet imaging criteria to recommend percutaneous sampling or continued dedicated follow-up. _________________________________________________________ There is an approximately 0.7 x 0.7 x 0.6 cm hypoechoic nodule within the superior pole of the right lobe of the thyroid (labeled 2), which does not meet imaging criteria to recommend percutaneous sampling or continued dedicated follow-up There is an approximately 0.6 x 0.6 x 0.5 cm anechoic cyst within the mid aspect the right lobe of the thyroid (labeled 3), which contains a echogenic foci with ring down artifact compatible with benign colloid. This benign colloid containing cyst does not meet imaging criteria to recommend percutaneous sampling or continued dedicated follow-up There is an approximately 0.9 x 0.9 x 0.8 cm spongiform/benign-appearing nodule within the mid aspect the right lobe of the thyroid (labeled 4), which does not meet imaging criteria to recommend percutaneous sampling or continued dedicated follow-up. _________________________________________________________ Nodule # 5: Location: Right; Inferior Maximum size: 2.0 cm; Other 2 dimensions: 2.0 x 1.2 cm Composition: solid/almost completely solid (2) Echogenicity: isoechoic (1) Shape: not taller-than-wide (0) Margins: ill-defined (0) Echogenic foci: none (0) ACR TI-RADS total points: 3. ACR TI-RADS risk category: TR3 (3 points). ACR TI-RADS recommendations: **Given size (>/= 2.5 cm) and appearance, fine needle aspiration of this mildly suspicious nodule should be considered based on TI-RADS criteria. _________________________________________________________ IMPRESSION: 1. Borderline thyromegaly with  findings suggestive of multinodular goiter. 2. Nodule #5 within the right lobe of the thyroid meets imaging criteria to recommend percutaneous sampling. 3. None of the remaining nodules/pseudo nodules meet imaging criteria to recommend percutaneous sampling or continued dedicated follow-up. The above is in keeping with the ACR TI-RADS recommendations - J Am Coll Radiol 2017;14:587-595. Electronically Signed   By: Sandi Mariscal M.D.   On: 04/15/2019 17:05   Ir Imaging Guided Port Insertion  Result Date: 05/12/2019 INDICATION: 68 year old with right breast cancer. Patient needs a Port-A-Cath for neoadjuvant chemotherapy. EXAM: FLUOROSCOPIC AND ULTRASOUND GUIDED PLACEMENT OF A SUBCUTANEOUS PORT. Physician: Stephan Minister. Anselm Pancoast, MD MEDICATIONS: Ancef 2 g; As antibiotic prophylaxis, Ancef 1 gm was ordered pre-procedure and administered intravenously within one hour of incision. ANESTHESIA/SEDATION: Versed 3.0 mg IV; Fentanyl 100 mcg IV; Moderate Sedation Time:  34 The patient was continuously monitored during the procedure by the interventional radiology nurse under my direct supervision. FLUOROSCOPY TIME:  1 minutes  and 24 seconds, 60.6 mGy COMPLICATIONS: None immediate. PROCEDURE: The risks of the procedure were explained to the patient. Informed consent was obtained. Patient was placed supine on the interventional table. Ultrasound confirmed a patent left internal jugular vein. Ultrasound image was saved for documentation. The left chest and neck were cleaned with a skin antiseptic and a sterile drape was placed. Maximal barrier sterile technique was utilized including caps, mask, sterile gowns, sterile gloves, sterile drape, hand hygiene and skin antiseptic. The left neck was anesthetized with 1% lidocaine. Small incision was made in the left neck with a blade. Micropuncture set was placed in the left IJ with ultrasound guidance. The micropuncture wire was used for measurement purposes. The left chest was anesthetized with  1% lidocaine with epinephrine. #15 blade was used to make an incision and a subcutaneous port pocket was formed. [place] was selected. Subcutaneous tunnel was formed with a stiff tunneling device. The port catheter was brought through the subcutaneous tunnel. The port was placed in the subcutaneous pocket. The micropuncture set was exchanged for a peel-away sheath. The catheter was placed through the peel-away sheath and the tip was positioned at the SVC and right atrium junction. Catheter was cut and attached to the port device. Port device was placed within the subcutaneous pocket and sutured in place. Catheter placement was confirmed with fluoroscopy. The port was accessed and flushed with heparinized saline. The port pocket was closed using two layers of absorbable sutures and Dermabond. The vein skin site was closed using a single layer of absorbable suture and Dermabond. Sterile dressings were applied. Patient tolerated the procedure well without an immediate complication. Ultrasound and fluoroscopic images were taken and saved for this procedure. Fluoroscopic and ultrasound images were taken and saved for documentation. IMPRESSION: Placement of a subcutaneous port device. Catheter tip at the SVC and right atrium junction. Electronically Signed   By: Markus Daft M.D.   On: 05/12/2019 13:33    ASSESSMENT: Clinical stage IIa ER/PR positive, HER-2 negative invasive carcinoma of the central portion of the right breast.  Oncotype DX score 7.  PLAN:   1. Clinical stage IIa ER/PR positive, HER-2 negative invasive carcinoma of the central portion of the right breast: Although patient is clinical stage IIa and typically neoadjuvant chemotherapy is the recommendation, patient did not wish to pursue aggressive immunosuppressive treatment during the COVID-19 pandemic.  Because of this, patient initiated neoadjuvant letrozole in approximately April 2020.  Patient is now more comfortable initiating  treatment and has agreed to neoadjuvant chemotherapy using Adriamycin, Cytoxan with Udenyca support followed by weekly Taxol x12.  Recent MRI results noted.  Pretreatment MUGA revealed an EF of 53%.  We will repeat this after 2 cycles of Adriamycin.  Proceed with cycle 1 of 4 of Adriamycin and Cytoxan today.  Return to clinic tomorrow for [facility].  Patient will then return to clinic in 1 week for laboratory work and video visit and then in 2 weeks for further evaluation and consideration of cycle 2.  2.  COVID positivity: Resolved.  Patient reports repeat testing is negative.  She is no longer under quarantine.  It is likely that the groundglass densities seen on her CT scan from March 17, 2019 are residual from her infection.  No intervention is needed at this time. 3.  Nausea and diarrhea: Resolved.   4.  Thyroid nodule: Biopsy reported as benign.  Likely multinodular goiter.  I spent a total of 30 minutes face-to-face with the patient  of which greater than 50% of the visit was spent in counseling and coordination of care as detailed above.   Patient expressed understanding and was in agreement with this plan. She also understands that She can call clinic at any time with any questions, concerns, or complaints.   Cancer Staging Cancer of central portion of right female breast [facility]) Staging form: Breast, AJCC 8th Edition - Clinical stage from 01/13/2019: Stage IIA (cT2, cN1, cM0, G1, ER+, PR+, HER2-) - Signed by Lloyd Huger, MD on 01/13/2019   Lloyd Huger, MD   05/13/2019 9:55 AM

## 2019-05-14 ENCOUNTER — Ambulatory Visit: Payer: BC Managed Care – PPO | Admitting: Oncology

## 2019-05-14 ENCOUNTER — Inpatient Hospital Stay: Payer: Medicare Other

## 2019-05-14 ENCOUNTER — Other Ambulatory Visit: Payer: Self-pay

## 2019-05-14 DIAGNOSIS — C773 Secondary and unspecified malignant neoplasm of axilla and upper limb lymph nodes: Secondary | ICD-10-CM | POA: Diagnosis not present

## 2019-05-14 DIAGNOSIS — Z170 Estrogen receptor positive status [ER+]: Secondary | ICD-10-CM

## 2019-05-14 DIAGNOSIS — Z5189 Encounter for other specified aftercare: Secondary | ICD-10-CM | POA: Diagnosis not present

## 2019-05-14 DIAGNOSIS — E119 Type 2 diabetes mellitus without complications: Secondary | ICD-10-CM | POA: Diagnosis not present

## 2019-05-14 DIAGNOSIS — C50111 Malignant neoplasm of central portion of right female breast: Secondary | ICD-10-CM | POA: Diagnosis not present

## 2019-05-14 DIAGNOSIS — Z5111 Encounter for antineoplastic chemotherapy: Secondary | ICD-10-CM | POA: Diagnosis not present

## 2019-05-14 MED ORDER — PEGFILGRASTIM-CBQV 6 MG/0.6ML ~~LOC~~ SOSY
6.0000 mg | PREFILLED_SYRINGE | Freq: Once | SUBCUTANEOUS | Status: AC
  Administered 2019-05-14: 6 mg via SUBCUTANEOUS
  Filled 2019-05-14: qty 0.6

## 2019-05-15 NOTE — Progress Notes (Signed)
Nichole Martinez  [phone]) [phone] [phone]) [phone]  ID: Nichole Martinez OB: 30-Nov-1950  MR#: [id number]  UXL#:[id number]  Patient Care Team: [name] as PCP - General (Family Medicine)   I connected with [name] on 05/21/19 at  2:45 PM EDT by video enabled telemedicine visit and verified that I am speaking with the correct person using two identifiers.   I discussed the limitations, risks, security and privacy concerns of performing an evaluation and management service by telemedicine and the availability of in-person appointments. I also discussed with the patient that there may be a patient responsible charge related to this service. The patient expressed understanding and agreed to proceed.   Other persons participating in the visit and their role in the encounter: Patient, MD  Patients location: Home Providers location: Clinic  CHIEF COMPLAINT: Clinical stage IIa ER/PR positive, HER-2 negative invasive carcinoma of the central portion of the right breast.  INTERVAL HISTORY: Patient agreed to evaluation via video enabled telemedicine to discuss her laboratory work and assess her toleration of cycle 1 of 4 of Adriamycin and Cytoxan.  Patient tolerated her treatment relatively well, but states she had approximately 18 to 24 hours of nausea and fatigue several days after treatment.  This resolved without intervention and she feels nearly back to her baseline. She has no neurologic complaints.  She denies any recent fevers or illnesses. She has a good appetite and denies weight loss.  She denies any chest pain, shortness of breath, cough, or hemoptysis.  She denies any nausea, vomiting, constipation, or diarrhea.  She has no urinary complaints.  Patient offers no further specific complaints today.  REVIEW OF SYSTEMS:   Review of Systems  Constitutional: Negative.  Negative for fever, malaise/fatigue and weight loss.  Respiratory: Negative.   Negative for cough, hemoptysis and shortness of breath.   Cardiovascular: Negative.  Negative for chest pain and leg swelling.  Gastrointestinal: Negative.  Negative for abdominal pain, diarrhea and nausea.  Genitourinary: Negative.  Negative for dysuria.  Musculoskeletal: Negative.  Negative for back pain and myalgias.  Skin: Negative.  Negative for rash.  Neurological: Negative.  Negative for focal weakness, weakness and headaches.  Psychiatric/Behavioral: Negative.  The patient is not nervous/anxious.     As per HPI. Otherwise, a complete review of systems is negative.  PAST MEDICAL HISTORY: Past Medical History:  Diagnosis Date   Allergy    Breast cancer, right ([place]) 2020   Currently taking hormonal chemo tx's.    Diabetes mellitus without complication ([place]) 5366   Hyperlipidemia    Hypertension    Osteoporosis    Trigeminal neuralgia     PAST SURGICAL HISTORY: Past Surgical History:  Procedure Laterality Date   ABDOMINAL HYSTERECTOMY  1978   BREAST BIOPSY Right 01/06/2019   [place] bx x 2.  Ribbon marker for mass and hydro for Lymph node. Path pending   BREAST CYST EXCISION Right 1986   BREAST EXCISIONAL BIOPSY     IR IMAGING GUIDED PORT INSERTION  05/12/2019   LAMINECTOMY  1995   L1, L2, L3   SPINE SURGERY      FAMILY HISTORY: Family History  Adopted: Yes  Family history unknown: Yes    ADVANCED DIRECTIVES (Y/N):  N  HEALTH MAINTENANCE: Social History   Tobacco Use   Smoking status: Former Smoker    Quit date: 03/16/2000    Years since quitting: 19.1   Smokeless tobacco: Never Used  Substance Use Topics  Alcohol use: No   Drug use: No     Colonoscopy:  PAP:  Bone density:  Lipid panel:  Allergies  Allergen Reactions   Morphine     Anaphallaxis.    Current Outpatient Medications  Medication Sig Dispense Refill   amLODipine (NORVASC) 5 MG tablet Take 1 tablet (5 mg total) by mouth daily. 90 tablet 3   baclofen (LIORESAL) 10  MG tablet Take 1 tablet (10 mg total) by mouth daily. 90 tablet 3   bisoprolol-hydrochlorothiazide (ZIAC) 10-6.25 MG tablet Take 1 tablet by mouth 2 (two) times daily. 180 tablet 3   buPROPion (WELLBUTRIN XL) 150 MG 24 hr tablet Take 1 tablet (150 mg total) by mouth 2 (two) times a day. 180 tablet 1   Cholecalciferol (VITAMIN D3) 5000 units CAPS Take by mouth.     letrozole (FEMARA) 2.5 MG tablet Take 1 tablet (2.5 mg total) by mouth daily. 90 tablet 3   lidocaine-prilocaine (EMLA) cream Apply to affected area once 30 g 3   lisinopril-hydrochlorothiazide (ZESTORETIC) 10-12.5 MG tablet Take 1 tablet by mouth daily. 90 tablet 3   loratadine (CLARITIN) 10 MG tablet Take 1 tablet (10 mg total) by mouth daily as needed. 90 tablet 3   meloxicam (MOBIC) 15 MG tablet Take 1 tablet (15 mg total) by mouth daily. 90 tablet 3   metFORMIN (GLUCOPHAGE) 1000 MG tablet Take 1 tablet (1,000 mg total) by mouth 2 (two) times daily with a meal. 180 tablet 3   MULTIPLE VITAMINS PO MULTIVITAMINS (Oral Tablet)  1 Every Day for 0 days  Quantity: 0.00;  Refills: 0   Ordered :17-Apr-2011  [name] ;  Started 24-February-2009 Active     OMEGA 3-6-9 FATTY ACIDS PO Reported on 10/04/2015     ondansetron (ZOFRAN) 8 MG tablet Take 1 tablet (8 mg total) by mouth 2 (two) times daily as needed. 30 tablet 2   prochlorperazine (COMPAZINE) 10 MG tablet Take 1 tablet (10 mg total) by mouth every 6 (six) hours as needed (Nausea or vomiting). 60 tablet 2   rizatriptan (MAXALT) 5 MG tablet TAKE 1 TABLET BY MOUTH AS NEEDED FOR MIGRAINE. MAY REPEAT IN 2 HOURS IF NEEDED. 10 tablet 0   simvastatin (ZOCOR) 20 MG tablet Take 1 tablet (20 mg total) by mouth at bedtime. 90 tablet 3   Vitamin D, Ergocalciferol, (DRISDOL) 1.25 MG (50000 UT) CAPS capsule Take 1 capsule (50,000 Units total) by mouth every 7 (seven) days. 12 capsule 4   vitamin E 1000 UNIT capsule Take 1,000 Units by mouth daily.     ALPRAZolam (XANAX) 1 MG tablet TAKE  ONE TABLET BY MOUTH AT BEDTIME AS NEEDED FOR ANXIETY (Patient not taking: Reported on 05/19/2019) 90 tablet 1   No current facility-administered medications for this visit.     OBJECTIVE: There were no vitals filed for this visit.   There is no height or weight on file to calculate BMI.    ECOG FS:0 - Asymptomatic  General: Well-developed, well-nourished, no acute distress. HEENT: Normocephalic. Neuro: Alert, answering all questions appropriately. Cranial nerves grossly intact. Psych: Normal affect.  LAB RESULTS:  Lab Results  Component Value Date   NA 139 05/20/2019   K 4.7 05/20/2019   CL 104 05/20/2019   CO2 27 05/20/2019   GLUCOSE 153 (H) 05/20/2019   BUN 23 05/20/2019   CREATININE 0.69 05/20/2019   CALCIUM 9.3 05/20/2019   PROT 6.5 05/20/2019   ALBUMIN 3.8 05/20/2019   AST 14 (L) 05/20/2019  ALT 15 05/20/2019   ALKPHOS 78 05/20/2019   BILITOT 0.7 05/20/2019   GFRNONAA >60 05/20/2019   GFRAA >60 05/20/2019    Lab Results  Component Value Date   WBC 2.7 (L) 05/20/2019   NEUTROABS 0.5 (L) 05/20/2019   HGB 12.1 05/20/2019   HCT 37.9 05/20/2019   MCV 82.2 05/20/2019   PLT 128 (L) 05/20/2019     STUDIES: Nm Cardiac Muga Rest  Result Date: 05/05/2019 CLINICAL DATA:  RIGHT breast cancer, pre cardiotoxic chemotherapy EXAM: NUCLEAR MEDICINE CARDIAC BLOOD POOL IMAGING (MUGA) TECHNIQUE: Cardiac multi-gated acquisition was performed at rest following intravenous injection of Tc-77mlabeled red blood cells. RADIOPHARMACEUTICALS:  21.75 mCi Tc-982mertechnetate in-vitro labeled red blood cells IV COMPARISON:  None FINDINGS: Calculated LEFT ventricular ejection fraction is 53%, low normal. Study was obtained at a cardiac rate of 51 bpm. Patient was rhythmic during imaging. Cine analysis of the LEFT ventricle in 3 projections demonstrates normal LV wall motion. IMPRESSION: Low normal LEFT ventricular ejection fraction of 53%. Normal LV wall motion. Electronically Signed   By: MaLavonia Dana.D.   On: 05/05/2019 14:21   Mr Breast Bilateral W Wo Contrast Inc Cad  Result Date: 05/11/2019 CLINICAL DATA:  Recent diagnosis of right breast carcinoma. Staging breast MRI. LABS:  No labs drawn at time of imaging. EXAM: BILATERAL BREAST MRI WITH AND WITHOUT CONTRAST TECHNIQUE: Multiplanar, multisequence MR images of both breasts were obtained prior to and following the intravenous administration of 10 ml of Gadavist Three-dimensional MR images were rendered by post-processing of the original MR data on an independent workstation. The three-dimensional MR images were interpreted, and findings are reported in the following complete MRI report for this study. Three dimensional images were evaluated at the independent DynaCad workstation COMPARISON:  Previous exam(s). FINDINGS: Breast composition: b. Scattered fibroglandular tissue. Background parenchymal enhancement: Mild. Right breast: Irregular enhancing mass anterior, upper slightly inner right breast reflects the recently biopsied carcinoma, associated with susceptibility artifact from the biopsy clip. Enhancement is predominantly irregular rim enhancement. The mass measures 2.1 x 1.6 x 1.7 cm. 6 mm enhancing mass in the lateral right breast, hyperintense on T2 weighted imaging, likely an intramammary lymph node. No other left breast masses or areas of abnormal enhancement. Left breast: 7 mm enhancing mass, posterior, lateral left breast, primarily wash-in connects with a small area of washout kinetics. No other masses or areas of abnormal enhancement in the left breast. Lymph nodes: Enlarged right axillary lymph node, cortical thickness of 11 mm, containing susceptibility artifact from the biopsy clip. 5 mm short axis node posterior to this with no defined hilar fat. Borderline abnormal lymph node posterior and inferior to the biopsied node with a cortical thickness of 3-4 mm. No abnormal left axillary lymph nodes. Ancillary findings:  None.  IMPRESSION: 1. Biopsy-proven right breast carcinoma measures 2.1 cm in greatest dimension on MRI. 2. Biopsy-proven right axillary metastatic adenopathy. Two adjacent abnormal/borderline abnormal lymph nodes. 3. 6 mm enhancing mass in the lateral right breast. This may reflect intramammary lymph node. Prior right breast ultrasound showed a cyst at 8 o'clock in the right breast. This is not a cyst based on MRI given its homogeneous enhancement. Recommend tissue sampling. 4. 7 mm enhancing mass, posterolateral left breast. Patient had a stable 9 mm mass on the prior ultrasound, which was in the medial left breast and does not correlate to the MRI finding. Tissue sampling recommended. RECOMMENDATION: 1. MRI guided core needle biopsy of the 6 mm mass  in the right breast, image 43, MRI sequence 6, and the 7 mm enhancing mass in the left breast, image 47, series 6. BI-RADS CATEGORY  4: Suspicious. Electronically Signed   By: Lajean Manes M.D.   On: 05/11/2019 12:30   [place] Fna Bx Thyroid 1st Lesion Afirma  Result Date: 04/26/2019 INDICATION: Right thyroid lobe nodule. EXAM: ULTRASOUND-DIRECTED THYROID BIOPSY MEDICATIONS: None. ANESTHESIA/SEDATION: Local 1% lidocaine. FLUOROSCOPY TIME:  Fluoroscopy Time: 0 minutes 0 seconds COMPLICATIONS: Nine. PROCEDURE: After discussing the risks and benefits of this procedure with the patient informed consent was obtained. Right neck was sterilely prepped and draped. Following local anesthesia with 1% lidocaine, four 25 gauge needle aspiration samples obtained for pathology. No complications. Postprocedural instructions given. Follow-up with patient's physician. IMPRESSION: Successful ultrasound-directed dominant right thyroid nodule fine needle aspiration. Sample sent to pathology. Electronically Signed   By: Marcello Moores  Register   On: 04/26/2019 13:59   Ir Imaging Guided Port Insertion  Result Date: 05/12/2019 INDICATION: 68 year old with right breast cancer. Patient needs a  Port-A-Cath for neoadjuvant chemotherapy. EXAM: FLUOROSCOPIC AND ULTRASOUND GUIDED PLACEMENT OF A SUBCUTANEOUS PORT. Physician: Stephan Minister. Anselm Pancoast, MD MEDICATIONS: Ancef 2 g; As antibiotic prophylaxis, Ancef 1 gm was ordered pre-procedure and administered intravenously within one hour of incision. ANESTHESIA/SEDATION: Versed 3.0 mg IV; Fentanyl 100 mcg IV; Moderate Sedation Time:  34 The patient was continuously monitored during the procedure by the interventional radiology nurse under my direct supervision. FLUOROSCOPY TIME:  1 minutes and 24 seconds, 60.1 mGy COMPLICATIONS: None immediate. PROCEDURE: The risks of the procedure were explained to the patient. Informed consent was obtained. Patient was placed supine on the interventional table. Ultrasound confirmed a patent left internal jugular vein. Ultrasound image was saved for documentation. The left chest and neck were cleaned with a skin antiseptic and a sterile drape was placed. Maximal barrier sterile technique was utilized including caps, mask, sterile gowns, sterile gloves, sterile drape, hand hygiene and skin antiseptic. The left neck was anesthetized with 1% lidocaine. Small incision was made in the left neck with a blade. Micropuncture set was placed in the left IJ with ultrasound guidance. The micropuncture wire was used for measurement purposes. The left chest was anesthetized with 1% lidocaine with epinephrine. #15 blade was used to make an incision and a subcutaneous port pocket was formed. [place] was selected. Subcutaneous tunnel was formed with a stiff tunneling device. The port catheter was brought through the subcutaneous tunnel. The port was placed in the subcutaneous pocket. The micropuncture set was exchanged for a peel-away sheath. The catheter was placed through the peel-away sheath and the tip was positioned at the SVC and right atrium junction. Catheter was cut and attached to the port device. Port device was placed within the  subcutaneous pocket and sutured in place. Catheter placement was confirmed with fluoroscopy. The port was accessed and flushed with heparinized saline. The port pocket was closed using two layers of absorbable sutures and Dermabond. The vein skin site was closed using a single layer of absorbable suture and Dermabond. Sterile dressings were applied. Patient tolerated the procedure well without an immediate complication. Ultrasound and fluoroscopic images were taken and saved for this procedure. Fluoroscopic and ultrasound images were taken and saved for documentation. IMPRESSION: Placement of a subcutaneous port device. Catheter tip at the SVC and right atrium junction. Electronically Signed   By: Markus Daft M.D.   On: 05/12/2019 13:33    ASSESSMENT: Clinical stage IIa ER/PR positive, HER-2 negative invasive  carcinoma of the central portion of the right breast.  Oncotype DX score 7.  PLAN:   1. Clinical stage IIa ER/PR positive, HER-2 negative invasive carcinoma of the central portion of the right breast: Although patient is clinical stage IIa and typically neoadjuvant chemotherapy is the recommendation, patient did not wish to pursue aggressive immunosuppressive treatment during the COVID-19 pandemic.  Because of this, patient initiated neoadjuvant letrozole in approximately April 2020.  Patient is now more comfortable initiating treatment and has agreed to neoadjuvant chemotherapy using Adriamycin, Cytoxan with Udenyca support followed by weekly Taxol x12.  Recent MRI results noted.  Pretreatment MUGA revealed an EF of 53%.  Will repeat this after 2 cycles of Adriamycin.  Patient tolerated cycle 1 of 4 of Adriamycin and Cytoxan last week relatively well.  Return to clinic in 1 week for further evaluation and consideration of cycle 2. 2.  COVID positivity: Resolved.  Patient reports repeat testing is negative.  She is no longer under quarantine.  It is likely that the groundglass densities seen on her CT  scan from March 17, 2019 are residual from her infection.  No intervention is needed at this time. 3.  Nausea and diarrhea: Resolved.   4.  Thyroid nodule: Biopsy reported as benign.  Likely multinodular goiter. 5.  Left breast nodule: 7 mm lesion noted on MRI, patient will have biopsy next week to further evaluate.  Results of his biopsy will not change the overall treatment plan.  I provided 15 minutes of face-to-face video visit time during this encounter, and > 50% was spent counseling as documented under my assessment & plan.   Patient expressed understanding and was in agreement with this plan. She also understands that She can call clinic at any time with any questions, concerns, or complaints.   Cancer Staging Cancer of central portion of right female breast [facility]) Staging form: Breast, AJCC 8th Edition - Clinical stage from 01/13/2019: Stage IIA (cT2, cN1, cM0, G1, ER+, PR+, HER2-) - Signed by Lloyd Huger, MD on 01/13/2019   Lloyd Huger, MD   05/21/2019 9:34 AM

## 2019-05-17 ENCOUNTER — Telehealth: Payer: Self-pay

## 2019-05-17 NOTE — Telephone Encounter (Signed)
Telephone call to patient for follow up after first chemotherapy treatment she received 05/13/2019.  Patient states after 30 hours of receiving chemo she did experience some nausea but it was short lived. She did take her antinausea meds and nausea went away.   States chemo infusion went good.  Denies any nausea at present.  Encourage patient to call for any questions or concerns.

## 2019-05-18 ENCOUNTER — Inpatient Hospital Stay: Payer: Medicare Other | Admitting: Nurse Practitioner

## 2019-05-19 ENCOUNTER — Encounter: Payer: Self-pay | Admitting: Oncology

## 2019-05-19 ENCOUNTER — Other Ambulatory Visit: Payer: Self-pay

## 2019-05-19 NOTE — Progress Notes (Signed)
Patient stated that when she was here last to get her chemo therapy, she had some nausea.

## 2019-05-20 ENCOUNTER — Inpatient Hospital Stay (HOSPITAL_BASED_OUTPATIENT_CLINIC_OR_DEPARTMENT_OTHER): Payer: Medicare Other | Admitting: Oncology

## 2019-05-20 ENCOUNTER — Inpatient Hospital Stay: Payer: Medicare Other | Attending: Oncology

## 2019-05-20 ENCOUNTER — Other Ambulatory Visit: Payer: Self-pay

## 2019-05-20 DIAGNOSIS — I10 Essential (primary) hypertension: Secondary | ICD-10-CM | POA: Diagnosis not present

## 2019-05-20 DIAGNOSIS — Z87891 Personal history of nicotine dependence: Secondary | ICD-10-CM | POA: Diagnosis not present

## 2019-05-20 DIAGNOSIS — Z7984 Long term (current) use of oral hypoglycemic drugs: Secondary | ICD-10-CM | POA: Insufficient documentation

## 2019-05-20 DIAGNOSIS — D649 Anemia, unspecified: Secondary | ICD-10-CM | POA: Diagnosis not present

## 2019-05-20 DIAGNOSIS — E119 Type 2 diabetes mellitus without complications: Secondary | ICD-10-CM | POA: Insufficient documentation

## 2019-05-20 DIAGNOSIS — Z79899 Other long term (current) drug therapy: Secondary | ICD-10-CM | POA: Diagnosis not present

## 2019-05-20 DIAGNOSIS — C773 Secondary and unspecified malignant neoplasm of axilla and upper limb lymph nodes: Secondary | ICD-10-CM | POA: Insufficient documentation

## 2019-05-20 DIAGNOSIS — C50111 Malignant neoplasm of central portion of right female breast: Secondary | ICD-10-CM

## 2019-05-20 DIAGNOSIS — Z79811 Long term (current) use of aromatase inhibitors: Secondary | ICD-10-CM | POA: Insufficient documentation

## 2019-05-20 DIAGNOSIS — Z5189 Encounter for other specified aftercare: Secondary | ICD-10-CM | POA: Diagnosis not present

## 2019-05-20 DIAGNOSIS — E041 Nontoxic single thyroid nodule: Secondary | ICD-10-CM | POA: Diagnosis not present

## 2019-05-20 DIAGNOSIS — Z170 Estrogen receptor positive status [ER+]: Secondary | ICD-10-CM | POA: Insufficient documentation

## 2019-05-20 DIAGNOSIS — Z5111 Encounter for antineoplastic chemotherapy: Secondary | ICD-10-CM | POA: Insufficient documentation

## 2019-05-20 DIAGNOSIS — C50011 Malignant neoplasm of nipple and areola, right female breast: Secondary | ICD-10-CM | POA: Diagnosis present

## 2019-05-20 LAB — COMPREHENSIVE METABOLIC PANEL
ALT: 15 U/L (ref 0–44)
AST: 14 U/L — ABNORMAL LOW (ref 15–41)
Albumin: 3.8 g/dL (ref 3.5–5.0)
Alkaline Phosphatase: 78 U/L (ref 38–126)
Anion gap: 8 (ref 5–15)
BUN: 23 mg/dL (ref 8–23)
CO2: 27 mmol/L (ref 22–32)
Calcium: 9.3 mg/dL (ref 8.9–10.3)
Chloride: 104 mmol/L (ref 98–111)
Creatinine, Ser: 0.69 mg/dL (ref 0.44–1.00)
GFR calc Af Amer: >60 mL/min (ref >60)
GFR calc non Af Amer: >60 mL/min (ref >60)
Glucose, Bld: 153 mg/dL — ABNORMAL HIGH (ref 70–99)
Potassium: 4.7 mmol/L (ref 3.5–5.1)
Sodium: 139 mmol/L (ref 135–145)
Total Bilirubin: 0.7 mg/dL (ref 0.3–1.2)
Total Protein: 6.5 g/dL (ref 6.5–8.1)

## 2019-05-20 LAB — CBC WITH DIFFERENTIAL/PLATELET
Abs Immature Granulocytes: 0.03 10*3/uL (ref 0.00–0.07)
Basophils Absolute: 0.1 10*3/uL (ref 0.0–0.1)
Basophils Relative: 2 %
Eosinophils Absolute: 0.8 10*3/uL — ABNORMAL HIGH (ref 0.0–0.5)
Eosinophils Relative: 29 %
HCT: 37.9 % (ref 36.0–46.0)
Hemoglobin: 12.1 g/dL (ref 12.0–15.0)
Immature Granulocytes: 1 %
Lymphocytes Relative: 43 %
Lymphs Abs: 1.2 10*3/uL (ref 0.7–4.0)
MCH: 26.2 pg (ref 26.0–34.0)
MCHC: 31.9 g/dL (ref 30.0–36.0)
MCV: 82.2 fL (ref 80.0–100.0)
Monocytes Absolute: 0.2 10*3/uL (ref 0.1–1.0)
Monocytes Relative: 6 %
Neutro Abs: 0.5 10*3/uL — ABNORMAL LOW (ref 1.7–7.7)
Neutrophils Relative %: 19 %
Platelets: 128 10*3/uL — ABNORMAL LOW (ref 150–400)
RBC: 4.61 MIL/uL (ref 3.87–5.11)
RDW: 13.6 % (ref 11.5–15.5)
WBC: 2.7 10*3/uL — ABNORMAL LOW (ref 4.0–10.5)
nRBC: 0 % (ref 0.0–0.2)

## 2019-05-21 NOTE — Progress Notes (Signed)
[place]  [phone] Fax:([phone]  ID: Nichole Martinez OB: 1951/05/26  MR#: [id number]  EML#:[id number]  Patient Care Team: [name], PA-C as PCP - General (Family Medicine)  CHIEF COMPLAINT: Clinical stage IIa ER/PR positive, HER-2 negative invasive carcinoma of the central portion of the right breast.  INTERVAL HISTORY: Patient returns to clinic today for further evaluation and consideration of cycle 2 of 4 of Adriamycin and Cytoxan.  She currently feels well and is back to her baseline.  She has no neurologic complaints.  She denies any recent fevers or illnesses. She has a good appetite and denies weight loss.  She denies any chest pain, shortness of breath, cough, or hemoptysis.  She denies any nausea, vomiting, constipation, or diarrhea.  She has no urinary complaints.  Patient offers no specific complaints today.  REVIEW OF SYSTEMS:   Review of Systems  Constitutional: Negative.  Negative for fever, malaise/fatigue and weight loss.  Respiratory: Negative.  Negative for cough, hemoptysis and shortness of breath.   Cardiovascular: Negative.  Negative for chest pain and leg swelling.  Gastrointestinal: Negative.  Negative for abdominal pain, diarrhea and nausea.  Genitourinary: Negative.  Negative for dysuria.  Musculoskeletal: Negative.  Negative for back pain and myalgias.  Skin: Negative.  Negative for rash.  Neurological: Negative.  Negative for focal weakness, weakness and headaches.  Psychiatric/Behavioral: Negative.  The patient is not nervous/anxious.     As per HPI. Otherwise, a complete review of systems is negative.  PAST MEDICAL HISTORY: Past Medical History:  Diagnosis Date   Allergy    Breast cancer, right ([place]) 2020   Currently taking hormonal chemo tx's.    Diabetes mellitus without complication ([place]) 7121   Hyperlipidemia    Hypertension    Osteoporosis    Trigeminal neuralgia     PAST SURGICAL  HISTORY: Past Surgical History:  Procedure Laterality Date   ABDOMINAL HYSTERECTOMY  1978   BREAST BIOPSY Right 01/06/2019   [place] bx x 2.  Ribbon marker for mass and hydro for Lymph node. Path pending   BREAST CYST EXCISION Right 1986   BREAST EXCISIONAL BIOPSY     IR IMAGING GUIDED PORT INSERTION  05/12/2019   LAMINECTOMY  1995   L1, L2, L3   SPINE SURGERY      FAMILY HISTORY: Family History  Adopted: Yes  Family history unknown: Yes    ADVANCED DIRECTIVES (Y/N):  N  HEALTH MAINTENANCE: Social History   Tobacco Use   Smoking status: Former Smoker    Quit date: 03/16/2000    Years since quitting: 19.2   Smokeless tobacco: Never Used  Substance Use Topics   Alcohol use: No   Drug use: No     Colonoscopy:  PAP:  Bone density:  Lipid panel:  Allergies  Allergen Reactions   Morphine     Anaphallaxis.    Current Outpatient Medications  Medication Sig Dispense Refill   ALPRAZolam (XANAX) 1 MG tablet TAKE ONE TABLET BY MOUTH AT BEDTIME AS NEEDED FOR ANXIETY 90 tablet 1   amLODipine (NORVASC) 5 MG tablet Take 1 tablet (5 mg total) by mouth daily. 90 tablet 3   baclofen (LIORESAL) 10 MG tablet Take 1 tablet (10 mg total) by mouth daily. 90 tablet 3   bisoprolol-hydrochlorothiazide (ZIAC) 10-6.25 MG tablet Take 1 tablet by mouth 2 (two) times daily. 180 tablet 3   buPROPion (WELLBUTRIN XL) 150 MG 24 hr tablet Take 1 tablet (150 mg total) by mouth  2 (two) times a day. 180 tablet 1   Cholecalciferol (VITAMIN D3) 5000 units CAPS Take by mouth.     letrozole (FEMARA) 2.5 MG tablet Take 1 tablet (2.5 mg total) by mouth daily. 90 tablet 3   lidocaine-prilocaine (EMLA) cream Apply to affected area once 30 g 3   lisinopril-hydrochlorothiazide (ZESTORETIC) 10-12.5 MG tablet Take 1 tablet by mouth daily. 90 tablet 3   loratadine (CLARITIN) 10 MG tablet Take 1 tablet (10 mg total) by mouth daily as needed. 90 tablet 3   meloxicam (MOBIC) 15 MG tablet Take 1  tablet (15 mg total) by mouth daily. 90 tablet 3   metFORMIN (GLUCOPHAGE) 1000 MG tablet Take 1 tablet (1,000 mg total) by mouth 2 (two) times daily with a meal. 180 tablet 3   MULTIPLE VITAMINS PO MULTIVITAMINS (Oral Tablet)  1 Every Day for 0 days  Quantity: 0.00;  Refills: 0   Ordered :17-Apr-2011  [name] ;  Started 24-February-2009 Active     OMEGA 3-6-9 FATTY ACIDS PO Reported on 10/04/2015     ondansetron (ZOFRAN) 8 MG tablet Take 1 tablet (8 mg total) by mouth 2 (two) times daily as needed. 30 tablet 2   prochlorperazine (COMPAZINE) 10 MG tablet Take 1 tablet (10 mg total) by mouth every 6 (six) hours as needed (Nausea or vomiting). 60 tablet 2   rizatriptan (MAXALT) 5 MG tablet TAKE 1 TABLET BY MOUTH AS NEEDED FOR  MIGRAINE.  MAY  REPEAT  IN  2  HOURS  IF  NEEDED 10 tablet 0   simvastatin (ZOCOR) 20 MG tablet Take 1 tablet (20 mg total) by mouth at bedtime. 90 tablet 3   Vitamin D, Ergocalciferol, (DRISDOL) 1.25 MG (50000 UT) CAPS capsule Take 1 capsule (50,000 Units total) by mouth every 7 (seven) days. 12 capsule 4   vitamin E 1000 UNIT capsule Take 1,000 Units by mouth daily.     No current facility-administered medications for this visit.     OBJECTIVE: Vitals:   05/27/19 0943  BP: 116/81  Pulse: 62  Temp: (!) 96 F (35.6 C)     Body mass index is 35.05 kg/m.    ECOG FS:0 - Asymptomatic  General: Well-developed, well-nourished, no acute distress. Eyes: Pink conjunctiva, anicteric sclera. HEENT: Normocephalic, moist mucous membranes. Lungs: Clear to auscultation bilaterally. Heart: Regular rate and rhythm. No rubs, murmurs, or gallops. Abdomen: Soft, nontender, nondistended. No organomegaly noted, normoactive bowel sounds. Musculoskeletal: No edema, cyanosis, or clubbing. Neuro: Alert, answering all questions appropriately. Cranial nerves grossly intact. Skin: No rashes or petechiae noted. Psych: Normal affect.  LAB RESULTS:  Lab Results  Component Value Date     NA 137 05/27/2019   K 4.3 05/27/2019   CL 105 05/27/2019   CO2 24 05/27/2019   GLUCOSE 136 (H) 05/27/2019   BUN 26 (H) 05/27/2019   CREATININE 0.74 05/27/2019   CALCIUM 9.0 05/27/2019   PROT 6.0 (L) 05/27/2019   ALBUMIN 3.9 05/27/2019   AST 16 05/27/2019   ALT 15 05/27/2019   ALKPHOS 74 05/27/2019   BILITOT 0.3 05/27/2019   GFRNONAA >60 05/27/2019   GFRAA >60 05/27/2019    Lab Results  Component Value Date   WBC 15.9 (H) 05/27/2019   NEUTROABS 9.8 (H) 05/27/2019   HGB 11.1 (L) 05/27/2019   HCT 34.5 (L) 05/27/2019   MCV 82.1 05/27/2019   PLT 175 05/27/2019     STUDIES: Nm Cardiac Muga Rest  Result Date: 05/05/2019 CLINICAL DATA:  RIGHT  breast cancer, pre cardiotoxic chemotherapy EXAM: NUCLEAR MEDICINE CARDIAC BLOOD POOL IMAGING (MUGA) TECHNIQUE: Cardiac multi-gated acquisition was performed at rest following intravenous injection of Tc-55mlabeled red blood cells. RADIOPHARMACEUTICALS:  21.75 mCi [id number] in-vitro labeled red blood cells IV COMPARISON:  None FINDINGS: Calculated LEFT ventricular ejection fraction is 53%, low normal. Study was obtained at a cardiac rate of 51 bpm. Patient was rhythmic during imaging. Cine analysis of the LEFT ventricle in 3 projections demonstrates normal LV wall motion. IMPRESSION: Low normal LEFT ventricular ejection fraction of 53%. Normal LV wall motion. Electronically Signed   By: MaLavonia Dana.D.   On: 05/05/2019 14:21   Mr Breast Bilateral W Wo Contrast Inc Cad  Result Date: 05/11/2019 CLINICAL DATA:  Recent diagnosis of right breast carcinoma. Staging breast MRI. LABS:  No labs drawn at time of imaging. EXAM: BILATERAL BREAST MRI WITH AND WITHOUT CONTRAST TECHNIQUE: Multiplanar, multisequence MR images of both breasts were obtained prior to and following the intravenous administration of 10 ml of Gadavist Three-dimensional MR images were rendered by post-processing of the original MR data on an independent workstation. The  three-dimensional MR images were interpreted, and findings are reported in the following complete MRI report for this study. Three dimensional images were evaluated at the independent DynaCad workstation COMPARISON:  Previous exam(s). FINDINGS: Breast composition: b. Scattered fibroglandular tissue. Background parenchymal enhancement: Mild. Right breast: Irregular enhancing mass anterior, upper slightly inner right breast reflects the recently biopsied carcinoma, associated with susceptibility artifact from the biopsy clip. Enhancement is predominantly irregular rim enhancement. The mass measures 2.1 x 1.6 x 1.7 cm. 6 mm enhancing mass in the lateral right breast, hyperintense on T2 weighted imaging, likely an intramammary lymph node. No other left breast masses or areas of abnormal enhancement. Left breast: 7 mm enhancing mass, posterior, lateral left breast, primarily wash-in connects with a small area of washout kinetics. No other masses or areas of abnormal enhancement in the left breast. Lymph nodes: Enlarged right axillary lymph node, cortical thickness of 11 mm, containing susceptibility artifact from the biopsy clip. 5 mm short axis node posterior to this with no defined hilar fat. Borderline abnormal lymph node posterior and inferior to the biopsied node with a cortical thickness of 3-4 mm. No abnormal left axillary lymph nodes. Ancillary findings:  None. IMPRESSION: 1. Biopsy-proven right breast carcinoma measures 2.1 cm in greatest dimension on MRI. 2. Biopsy-proven right axillary metastatic adenopathy. Two adjacent abnormal/borderline abnormal lymph nodes. 3. 6 mm enhancing mass in the lateral right breast. This may reflect intramammary lymph node. Prior right breast ultrasound showed a cyst at 8 o'clock in the right breast. This is not a cyst based on MRI given its homogeneous enhancement. Recommend tissue sampling. 4. 7 mm enhancing mass, posterolateral left breast. Patient had a stable 9 mm mass on  the prior ultrasound, which was in the medial left breast and does not correlate to the MRI finding. Tissue sampling recommended. RECOMMENDATION: 1. MRI guided core needle biopsy of the 6 mm mass in the right breast, image 43, MRI sequence 6, and the 7 mm enhancing mass in the left breast, image 47, series 6. BI-RADS CATEGORY  4: Suspicious. Electronically Signed   By: DaLajean Manes.D.   On: 05/11/2019 12:30   Ir Imaging Guided Port Insertion  Result Date: 05/12/2019 INDICATION: [age]ear old with right breast cancer. Patient needs a Port-A-Cath for neoadjuvant chemotherapy. EXAM: FLUOROSCOPIC AND ULTRASOUND GUIDED PLACEMENT OF A SUBCUTANEOUS PORT. Physician: AdStephan MinisterHeAnselm PancoastMD MEDICATIONS:  Ancef 2 g; As antibiotic prophylaxis, Ancef 1 gm was ordered pre-procedure and administered intravenously within one hour of incision. ANESTHESIA/SEDATION: Versed 3.0 mg IV; Fentanyl 100 mcg IV; Moderate Sedation Time:  34 The patient was continuously monitored during the procedure by the interventional radiology nurse under my direct supervision. FLUOROSCOPY TIME:  1 minutes and 24 seconds, 50.0 mGy COMPLICATIONS: None immediate. PROCEDURE: The risks of the procedure were explained to the patient. Informed consent was obtained. Patient was placed supine on the interventional table. Ultrasound confirmed a patent left internal jugular vein. Ultrasound image was saved for documentation. The left chest and neck were cleaned with a skin antiseptic and a sterile drape was placed. Maximal barrier sterile technique was utilized including caps, mask, sterile gowns, sterile gloves, sterile drape, hand hygiene and skin antiseptic. The left neck was anesthetized with 1% lidocaine. Small incision was made in the left neck with a blade. Micropuncture set was placed in the left IJ with ultrasound guidance. The micropuncture wire was used for measurement purposes. The left chest was anesthetized with 1% lidocaine with epinephrine. #15 blade  was used to make an incision and a subcutaneous port pocket was formed. [place] was selected. Subcutaneous tunnel was formed with a stiff tunneling device. The port catheter was brought through the subcutaneous tunnel. The port was placed in the subcutaneous pocket. The micropuncture set was exchanged for a peel-away sheath. The catheter was placed through the peel-away sheath and the tip was positioned at the SVC and right atrium junction. Catheter was cut and attached to the port device. Port device was placed within the subcutaneous pocket and sutured in place. Catheter placement was confirmed with fluoroscopy. The port was accessed and flushed with heparinized saline. The port pocket was closed using two layers of absorbable sutures and Dermabond. The vein skin site was closed using a single layer of absorbable suture and Dermabond. Sterile dressings were applied. Patient tolerated the procedure well without an immediate complication. Ultrasound and fluoroscopic images were taken and saved for this procedure. Fluoroscopic and ultrasound images were taken and saved for documentation. IMPRESSION: Placement of a subcutaneous port device. Catheter tip at the SVC and right atrium junction. Electronically Signed   By: Markus Daft M.D.   On: 05/12/2019 13:33    ASSESSMENT: Clinical stage IIa ER/PR positive, HER-2 negative invasive carcinoma of the central portion of the right breast.  Oncotype DX score 7.  PLAN:   1. Clinical stage IIa ER/PR positive, HER-2 negative invasive carcinoma of the central portion of the right breast: Although patient is clinical stage IIa and typically neoadjuvant chemotherapy is the recommendation, patient did not wish to pursue aggressive immunosuppressive treatment during the COVID-19 pandemic.  Because of this, patient initiated neoadjuvant letrozole in approximately April 2020.  Patient is now more comfortable initiating treatment and has agreed to neoadjuvant  chemotherapy using Adriamycin, Cytoxan with Udenyca support followed by weekly Taxol x12.  Recent MRI results noted.  Pretreatment MUGA revealed an EF of 53%.  Repeat MUGA in 2 weeks prior to cycle 3.  Proceed with cycle 2 of 4 of Adriamycin and Cytoxan today.  Return to clinic tomorrow for a Udenyca and then in 2 weeks for further evaluation and consideration of cycle 3.   2.  COVID positivity: Resolved.  Patient reports repeat testing is negative.  She is no longer under quarantine.  It is likely that the groundglass densities seen on her CT scan from March 17, 2019 are residual from  her infection.  No intervention is needed at this time. 3.  Nausea and diarrhea: Resolved.   4.  Thyroid nodule: Biopsy reported as benign.  Likely multinodular goiter. 5.  Left breast nodule: 7 mm lesion noted on MRI, patient will have biopsy in the near future plan results of this biopsy will likely not change the overall treatment plan.  I spent a total of 30 minutes face-to-face with the patient of which greater than 50% of the visit was spent in counseling and coordination of care as detailed above.   Patient expressed understanding and was in agreement with this plan. She also understands that She can call clinic at any time with any questions, concerns, or complaints.   Cancer Staging Cancer of central portion of right female breast [facility]) Staging form: Breast, AJCC 8th Edition - Clinical stage from 01/13/2019: Stage IIA (cT2, cN1, cM0, G1, ER+, PR+, HER2-) - Signed by Lloyd Huger, MD on 01/13/2019   Lloyd Huger, MD   05/27/2019 6:50 PM

## 2019-05-23 ENCOUNTER — Other Ambulatory Visit: Payer: Self-pay | Admitting: Physician Assistant

## 2019-05-23 DIAGNOSIS — G43109 Migraine with aura, not intractable, without status migrainosus: Secondary | ICD-10-CM

## 2019-05-26 ENCOUNTER — Other Ambulatory Visit: Payer: Self-pay | Admitting: General Surgery

## 2019-05-26 ENCOUNTER — Other Ambulatory Visit: Payer: Self-pay

## 2019-05-26 ENCOUNTER — Encounter: Payer: Self-pay | Admitting: Oncology

## 2019-05-26 DIAGNOSIS — R9389 Abnormal findings on diagnostic imaging of other specified body structures: Secondary | ICD-10-CM

## 2019-05-26 NOTE — Progress Notes (Signed)
Patient pre screened no concerns or questions today.

## 2019-05-27 ENCOUNTER — Inpatient Hospital Stay (HOSPITAL_BASED_OUTPATIENT_CLINIC_OR_DEPARTMENT_OTHER): Payer: Medicare Other | Admitting: Oncology

## 2019-05-27 ENCOUNTER — Inpatient Hospital Stay: Payer: Medicare Other

## 2019-05-27 ENCOUNTER — Other Ambulatory Visit: Payer: Self-pay

## 2019-05-27 VITALS — BP 116/81 | HR 62 | Temp 96.0°F | Wt 223.8 lb

## 2019-05-27 DIAGNOSIS — Z5111 Encounter for antineoplastic chemotherapy: Secondary | ICD-10-CM | POA: Diagnosis not present

## 2019-05-27 DIAGNOSIS — C50111 Malignant neoplasm of central portion of right female breast: Secondary | ICD-10-CM | POA: Diagnosis not present

## 2019-05-27 DIAGNOSIS — Z170 Estrogen receptor positive status [ER+]: Secondary | ICD-10-CM

## 2019-05-27 DIAGNOSIS — C773 Secondary and unspecified malignant neoplasm of axilla and upper limb lymph nodes: Secondary | ICD-10-CM | POA: Diagnosis not present

## 2019-05-27 DIAGNOSIS — Z5189 Encounter for other specified aftercare: Secondary | ICD-10-CM | POA: Diagnosis not present

## 2019-05-27 DIAGNOSIS — D649 Anemia, unspecified: Secondary | ICD-10-CM | POA: Diagnosis not present

## 2019-05-27 LAB — COMPREHENSIVE METABOLIC PANEL
ALT: 15 U/L (ref 0–44)
AST: 16 U/L (ref 15–41)
Albumin: 3.9 g/dL (ref 3.5–5.0)
Alkaline Phosphatase: 74 U/L (ref 38–126)
Anion gap: 8 (ref 5–15)
BUN: 26 mg/dL — ABNORMAL HIGH (ref 8–23)
CO2: 24 mmol/L (ref 22–32)
Calcium: 9 mg/dL (ref 8.9–10.3)
Chloride: 105 mmol/L (ref 98–111)
Creatinine, Ser: 0.74 mg/dL (ref 0.44–1.00)
GFR calc Af Amer: >60 mL/min (ref >60)
GFR calc non Af Amer: >60 mL/min (ref >60)
Glucose, Bld: 136 mg/dL — ABNORMAL HIGH (ref 70–99)
Potassium: 4.3 mmol/L (ref 3.5–5.1)
Sodium: 137 mmol/L (ref 135–145)
Total Bilirubin: 0.3 mg/dL (ref 0.3–1.2)
Total Protein: 6 g/dL — ABNORMAL LOW (ref 6.5–8.1)

## 2019-05-27 LAB — CBC WITH DIFFERENTIAL/PLATELET
Abs Immature Granulocytes: 1.8 10*3/uL — ABNORMAL HIGH (ref 0.00–0.07)
Basophils Absolute: 0.1 10*3/uL (ref 0.0–0.1)
Basophils Relative: 0 %
Eosinophils Absolute: 0.3 10*3/uL (ref 0.0–0.5)
Eosinophils Relative: 2 %
HCT: 34.5 % — ABNORMAL LOW (ref 36.0–46.0)
Hemoglobin: 11.1 g/dL — ABNORMAL LOW (ref 12.0–15.0)
Immature Granulocytes: 11 %
Lymphocytes Relative: 17 %
Lymphs Abs: 2.7 10*3/uL (ref 0.7–4.0)
MCH: 26.4 pg (ref 26.0–34.0)
MCHC: 32.2 g/dL (ref 30.0–36.0)
MCV: 82.1 fL (ref 80.0–100.0)
Monocytes Absolute: 1.1 10*3/uL — ABNORMAL HIGH (ref 0.1–1.0)
Monocytes Relative: 7 %
Neutro Abs: 9.8 10*3/uL — ABNORMAL HIGH (ref 1.7–7.7)
Neutrophils Relative %: 63 %
Platelets: 175 10*3/uL (ref 150–400)
RBC: 4.2 MIL/uL (ref 3.87–5.11)
RDW: 13.8 % (ref 11.5–15.5)
Smear Review: NORMAL
WBC: 15.9 10*3/uL — ABNORMAL HIGH (ref 4.0–10.5)
nRBC: 0.2 % (ref 0.0–0.2)

## 2019-05-27 MED ORDER — SODIUM CHLORIDE 0.9 % IV SOLN
Freq: Once | INTRAVENOUS | Status: AC
  Administered 2019-05-27: 11:00:00 via INTRAVENOUS
  Filled 2019-05-27: qty 5

## 2019-05-27 MED ORDER — SODIUM CHLORIDE 0.9 % IV SOLN
Freq: Once | INTRAVENOUS | Status: AC
  Administered 2019-05-27: 11:00:00 via INTRAVENOUS
  Filled 2019-05-27: qty 250

## 2019-05-27 MED ORDER — PALONOSETRON HCL INJECTION 0.25 MG/5ML
0.2500 mg | Freq: Once | INTRAVENOUS | Status: AC
  Administered 2019-05-27: 0.25 mg via INTRAVENOUS
  Filled 2019-05-27: qty 5

## 2019-05-27 MED ORDER — SODIUM CHLORIDE 0.9 % IV SOLN
600.0000 mg/m2 | Freq: Once | INTRAVENOUS | Status: AC
  Administered 2019-05-27: 12:00:00 1320 mg via INTRAVENOUS
  Filled 2019-05-27: qty 50

## 2019-05-27 MED ORDER — HEPARIN SOD (PORK) LOCK FLUSH 100 UNIT/ML IV SOLN
500.0000 [IU] | Freq: Once | INTRAVENOUS | Status: AC
  Administered 2019-05-27: 500 [IU] via INTRAVENOUS
  Filled 2019-05-27: qty 5

## 2019-05-27 MED ORDER — SODIUM CHLORIDE 0.9% FLUSH
10.0000 mL | INTRAVENOUS | Status: DC | PRN
  Administered 2019-05-27: 10 mL via INTRAVENOUS
  Filled 2019-05-27: qty 10

## 2019-05-27 MED ORDER — DOXORUBICIN HCL CHEMO IV INJECTION 2 MG/ML
60.0000 mg/m2 | Freq: Once | INTRAVENOUS | Status: AC
  Administered 2019-05-27: 132 mg via INTRAVENOUS
  Filled 2019-05-27: qty 66

## 2019-05-28 ENCOUNTER — Other Ambulatory Visit: Payer: Self-pay

## 2019-05-28 ENCOUNTER — Inpatient Hospital Stay: Payer: Medicare Other

## 2019-05-28 DIAGNOSIS — D649 Anemia, unspecified: Secondary | ICD-10-CM | POA: Diagnosis not present

## 2019-05-28 DIAGNOSIS — C50111 Malignant neoplasm of central portion of right female breast: Secondary | ICD-10-CM

## 2019-05-28 DIAGNOSIS — Z5111 Encounter for antineoplastic chemotherapy: Secondary | ICD-10-CM | POA: Diagnosis not present

## 2019-05-28 DIAGNOSIS — C773 Secondary and unspecified malignant neoplasm of axilla and upper limb lymph nodes: Secondary | ICD-10-CM | POA: Diagnosis not present

## 2019-05-28 DIAGNOSIS — Z170 Estrogen receptor positive status [ER+]: Secondary | ICD-10-CM | POA: Diagnosis not present

## 2019-05-28 DIAGNOSIS — Z5189 Encounter for other specified aftercare: Secondary | ICD-10-CM | POA: Diagnosis not present

## 2019-05-28 MED ORDER — PEGFILGRASTIM-CBQV 6 MG/0.6ML ~~LOC~~ SOSY
6.0000 mg | PREFILLED_SYRINGE | Freq: Once | SUBCUTANEOUS | Status: AC
  Administered 2019-05-28: 6 mg via SUBCUTANEOUS
  Filled 2019-05-28: qty 0.6

## 2019-06-03 NOTE — Progress Notes (Signed)
[place]  [phone] [phone]  ID: AAHANA ELZA OB: [birth year]  MR#: [id number]  GXQ#:[id number]  Patient Care Team: [name], PA-C as PCP - General (Family Medicine)  CHIEF COMPLAINT: Clinical stage IIa ER/PR positive, HER-2 negative invasive carcinoma of the central portion of the right breast.  INTERVAL HISTORY: Patient returns to clinic today for further evaluation and consideration of cycle 3 of 4 of Adriamycin and Cytoxan with Udenyca support.  She currently feels well and is asymptomatic. She has no neurologic complaints.  She denies any recent fevers or illnesses. She has a good appetite and denies weight loss.  She denies any chest pain, shortness of breath, cough, or hemoptysis.  She denies any nausea, vomiting, constipation, or diarrhea.  She has no urinary complaints.  Patient feels at her baseline offers no specific complaints today.  REVIEW OF SYSTEMS:   Review of Systems  Constitutional: Negative.  Negative for fever, malaise/fatigue and weight loss.  Respiratory: Negative.  Negative for cough, hemoptysis and shortness of breath.   Cardiovascular: Negative.  Negative for chest pain and leg swelling.  Gastrointestinal: Negative.  Negative for abdominal pain, diarrhea and nausea.  Genitourinary: Negative.  Negative for dysuria.  Musculoskeletal: Negative.  Negative for back pain and myalgias.  Skin: Negative.  Negative for rash.  Neurological: Negative.  Negative for focal weakness, weakness and headaches.  Psychiatric/Behavioral: Negative.  The patient is not nervous/anxious.     As per HPI. Otherwise, a complete review of systems is negative.  PAST MEDICAL HISTORY: Past Medical History:  Diagnosis Date  . Allergy   . Breast cancer, right ([place]) 2020   Currently taking hormonal chemo tx's.   . Diabetes mellitus without complication ([place]) 1448  . Hyperlipidemia   . Hypertension   . Osteoporosis   .  Trigeminal neuralgia     PAST SURGICAL HISTORY: Past Surgical History:  Procedure Laterality Date  . ABDOMINAL HYSTERECTOMY  1978  . BREAST BIOPSY Right 01/06/2019   [place] bx x 2.  Ribbon marker for mass and hydro for Lymph node. Path pending  . BREAST CYST EXCISION Right 1986  . BREAST EXCISIONAL BIOPSY    . IR IMAGING GUIDED PORT INSERTION  05/12/2019  . LAMINECTOMY  1995   L1, L2, L3  . SPINE SURGERY      FAMILY HISTORY: Family History  Adopted: Yes  Family history unknown: Yes    ADVANCED DIRECTIVES (Y/N):  N  HEALTH MAINTENANCE: Social History   Tobacco Use  . Smoking status: Former Smoker    Quit date: 03/16/2000    Years since quitting: 19.2  . Smokeless tobacco: Never Used  Substance Use Topics  . Alcohol use: No  . Drug use: No     Colonoscopy:  PAP:  Bone density:  Lipid panel:  Allergies  Allergen Reactions  . Morphine     Anaphallaxis.    Current Outpatient Medications  Medication Sig Dispense Refill  . ALPRAZolam (XANAX) 1 MG tablet TAKE ONE TABLET BY MOUTH AT BEDTIME AS NEEDED FOR ANXIETY 90 tablet 1  . amLODipine (NORVASC) 5 MG tablet Take 1 tablet (5 mg total) by mouth daily. 90 tablet 3  . baclofen (LIORESAL) 10 MG tablet Take 1 tablet (10 mg total) by mouth daily. 90 tablet 3  . bisoprolol-hydrochlorothiazide (ZIAC) 10-6.25 MG tablet Take 1 tablet by mouth 2 (two) times daily. 180 tablet 3  . buPROPion (WELLBUTRIN XL) 150 MG 24 hr tablet Take 1 tablet (150 mg  total) by mouth 2 (two) times a day. 180 tablet 1  . Cholecalciferol (VITAMIN D3) 5000 units CAPS Take by mouth.    . letrozole (FEMARA) 2.5 MG tablet Take 1 tablet (2.5 mg total) by mouth daily. 90 tablet 3  . lidocaine-prilocaine (EMLA) cream Apply to affected area once 30 g 3  . lisinopril-hydrochlorothiazide (ZESTORETIC) 10-12.5 MG tablet Take 1 tablet by mouth daily. 90 tablet 3  . loratadine (CLARITIN) 10 MG tablet Take 1 tablet (10 mg total) by mouth daily as needed. 90 tablet 3  .  meloxicam (MOBIC) 15 MG tablet Take 1 tablet (15 mg total) by mouth daily. 90 tablet 3  . metFORMIN (GLUCOPHAGE) 1000 MG tablet Take 1 tablet (1,000 mg total) by mouth 2 (two) times daily with a meal. 180 tablet 3  . MULTIPLE VITAMINS PO MULTIVITAMINS (Oral Tablet)  1 Every Day for 0 days  Quantity: 0.00;  Refills: 0   Ordered :17-Apr-2011  [name] ;  Started 24-February-2009 Active    . OMEGA 3-6-9 FATTY ACIDS PO Reported on 10/04/2015    . ondansetron (ZOFRAN) 8 MG tablet Take 1 tablet (8 mg total) by mouth 2 (two) times daily as needed. 30 tablet 2  . prochlorperazine (COMPAZINE) 10 MG tablet Take 1 tablet (10 mg total) by mouth every 6 (six) hours as needed (Nausea or vomiting). 60 tablet 2  . rizatriptan (MAXALT) 5 MG tablet TAKE 1 TABLET BY MOUTH AS NEEDED FOR  MIGRAINE.  MAY  REPEAT  IN  2  HOURS  IF  NEEDED 10 tablet 0  . simvastatin (ZOCOR) 20 MG tablet Take 1 tablet (20 mg total) by mouth at bedtime. 90 tablet 3  . Vitamin D, Ergocalciferol, (DRISDOL) 1.25 MG (50000 UT) CAPS capsule Take 1 capsule (50,000 Units total) by mouth every 7 (seven) days. 12 capsule 4  . vitamin E 1000 UNIT capsule Take 1,000 Units by mouth daily.     No current facility-administered medications for this visit.     OBJECTIVE: Vitals:   06/10/19 0906  BP: 121/85  Pulse: (!) 59  Resp: 20     Body mass index is 34.93 kg/m.    ECOG FS:0 - Asymptomatic  General: Well-developed, well-nourished, no acute distress. Eyes: Pink conjunctiva, anicteric sclera. HEENT: Normocephalic, moist mucous membranes. Breast: Exam deferred today. Lungs: Clear to auscultation bilaterally. Heart: Regular rate and rhythm. No rubs, murmurs, or gallops. Abdomen: Soft, nontender, nondistended. No organomegaly noted, normoactive bowel sounds. Musculoskeletal: No edema, cyanosis, or clubbing. Neuro: Alert, answering all questions appropriately. Cranial nerves grossly intact. Skin: No rashes or petechiae noted. Psych: Normal affect.   LAB RESULTS:  Lab Results  Component Value Date   NA 140 06/10/2019   K 4.2 06/10/2019   CL 105 06/10/2019   CO2 26 06/10/2019   GLUCOSE 142 (H) 06/10/2019   BUN 17 06/10/2019   CREATININE 0.94 06/10/2019   CALCIUM 9.2 06/10/2019   PROT 6.4 (L) 06/10/2019   ALBUMIN 3.9 06/10/2019   AST 18 06/10/2019   ALT 15 06/10/2019   ALKPHOS 76 06/10/2019   BILITOT 0.4 06/10/2019   GFRNONAA >60 06/10/2019   GFRAA >60 06/10/2019    Lab Results  Component Value Date   WBC 15.6 (H) 06/10/2019   NEUTROABS PENDING 06/10/2019   HGB 10.6 (L) 06/10/2019   HCT 33.3 (L) 06/10/2019   MCV 81.4 06/10/2019   PLT 228 06/10/2019     STUDIES: Nm Cardiac Muga Rest  Result Date: 06/09/2019 CLINICAL DATA:  Breast cancer, on cardiotoxic chemotherapy EXAM: NUCLEAR MEDICINE CARDIAC BLOOD POOL IMAGING (MUGA) TECHNIQUE: Cardiac multi-gated acquisition was performed at rest following intravenous injection of Tc-71mlabeled red blood cells. RADIOPHARMACEUTICALS:  22.87 mCi [id number] in-vitro labeled red blood cells IV COMPARISON:  05/05/2019 FINDINGS: Calculated LEFT ventricular ejection fraction is 52.2%, not significantly changed from the 52.8% on the previous exam. Study was obtained at a cardiac rate of 56 bpm. Patient was rhythmic during imaging. Cine analysis of the LEFT ventricle in 3 projections demonstrates normal LEFT ventricular wall motion. IMPRESSION: Stable LEFT ventricular ejection fraction of 52.2% not significantly changed from the 52.8% on the prior study. No focal wall motion abnormalities identified. Electronically Signed   By: MaLavonia Dana.D.   On: 06/09/2019 13:43   Mr Breast Left W Wo Contrast Inc Cad  Result Date: 06/07/2019 CLINICAL DATA:  Patient presents for MR guided core biopsy of the LEFT breast. The patient's planning RIGHT mastectomy for known RIGHT breast cancer. LABS:  None obtained at the time of imaging. EXAM: MR OF THE LEFT BREAST WITH AND WITHOUT CONTRAST  TECHNIQUE: Multiplanar multisequence MR images of the left breast were obtained prior to and following the intravenous administration of 10 ml of Gadavist. Three-dimensional MR images were rendered by post-processing of the original MR data on an independent workstation. The three-dimensional MR images were interpreted, and findings are reported in the following complete MRI report for this study. Three dimensional images were evaluated at the independent DynaCad workstation COMPARISON:  MRI 05/11/2019 and earlier breast imaging studies FINDINGS: Breast composition: c. Heterogeneous fibroglandular tissue. Background parenchymal enhancement: Mild Left breast: The 7 millimeter enhancing mass previously seen in the posterior OUTER portion of the LEFT breast is no longer identified. No new or suspicious findings in the LEFT breast. Lymph nodes: No abnormal appearing lymph nodes. Ancillary findings: Known RIGHT breast cancer is partially imaged. LEFT-sided Port-A-Cath. IMPRESSION: 1. The enhancing mass previous exam the LEFT breast is no longer apparent. 2. Biopsy is not performed today. RECOMMENDATION: Recommend follow-up bilateral breast MRI with and without contrast in 6 months to re-evaluate the LEFT breast. Treatment plan for known RIGHT breast cancer. BI-RADS CATEGORY  1: Negative. Electronically Signed   By: ElNolon Nations.D.   On: 06/07/2019 09:48   Ir Imaging Guided Port Insertion  Result Date: 05/12/2019 INDICATION: [age]ear old with right breast cancer. Patient needs a Port-A-Cath for neoadjuvant chemotherapy. EXAM: FLUOROSCOPIC AND ULTRASOUND GUIDED PLACEMENT OF A SUBCUTANEOUS PORT. Physician: AdStephan MinisterHeAnselm PancoastMD MEDICATIONS: Ancef 2 g; As antibiotic prophylaxis, Ancef 1 gm was ordered pre-procedure and administered intravenously within one hour of incision. ANESTHESIA/SEDATION: Versed 3.0 mg IV; Fentanyl 100 mcg IV; Moderate Sedation Time:  34 The patient was continuously monitored during the procedure  by the interventional radiology nurse under my direct supervision. FLUOROSCOPY TIME:  1 minutes and 24 seconds, 2229.5Gy COMPLICATIONS: None immediate. PROCEDURE: The risks of the procedure were explained to the patient. Informed consent was obtained. Patient was placed supine on the interventional table. Ultrasound confirmed a patent left internal jugular vein. Ultrasound image was saved for documentation. The left chest and neck were cleaned with a skin antiseptic and a sterile drape was placed. Maximal barrier sterile technique was utilized including caps, mask, sterile gowns, sterile gloves, sterile drape, hand hygiene and skin antiseptic. The left neck was anesthetized with 1% lidocaine. Small incision was made in the left neck with a blade. Micropuncture set was placed in the left IJ with ultrasound guidance. The  micropuncture wire was used for measurement purposes. The left chest was anesthetized with 1% lidocaine with epinephrine. #15 blade was used to make an incision and a subcutaneous port pocket was formed. [place] was selected. Subcutaneous tunnel was formed with a stiff tunneling device. The port catheter was brought through the subcutaneous tunnel. The port was placed in the subcutaneous pocket. The micropuncture set was exchanged for a peel-away sheath. The catheter was placed through the peel-away sheath and the tip was positioned at the SVC and right atrium junction. Catheter was cut and attached to the port device. Port device was placed within the subcutaneous pocket and sutured in place. Catheter placement was confirmed with fluoroscopy. The port was accessed and flushed with heparinized saline. The port pocket was closed using two layers of absorbable sutures and Dermabond. The vein skin site was closed using a single layer of absorbable suture and Dermabond. Sterile dressings were applied. Patient tolerated the procedure well without an immediate complication. Ultrasound and  fluoroscopic images were taken and saved for this procedure. Fluoroscopic and ultrasound images were taken and saved for documentation. IMPRESSION: Placement of a subcutaneous port device. Catheter tip at the SVC and right atrium junction. Electronically Signed   By: Markus Daft M.D.   On: 05/12/2019 13:33    ASSESSMENT: Clinical stage IIa ER/PR positive, HER-2 negative invasive carcinoma of the central portion of the right breast.  Oncotype DX score 7.  PLAN:   1. Clinical stage IIa ER/PR positive, HER-2 negative invasive carcinoma of the central portion of the right breast: Although patient is clinical stage IIa and typically neoadjuvant chemotherapy is the recommendation, patient did not wish to pursue aggressive immunosuppressive treatment during the COVID-19 pandemic.  Because of this, patient initiated neoadjuvant letrozole in approximately April 2020.  Patient is now more comfortable initiating treatment and has agreed to neoadjuvant chemotherapy using Adriamycin, Cytoxan with Udenyca support followed by weekly Taxol x12.  Recent MRI results noted.  Repeat MUGA on June 09, 2019 revealed an ejection fraction of 52.2% which is unchanged from prior.  Proceed with cycle 3 of Adriamycin and Cytoxan today.  Return to clinic tomorrow for Udenyca and then in 2 weeks for further evaluation and consideration of cycle 4. 2.  COVID positivity: Resolved.  Patient reports repeat testing is negative.  She is no longer under quarantine.  It is likely that the groundglass densities seen on her CT scan from March 17, 2019 are residual from her infection.  No intervention is needed at this time. 3.  Nausea and diarrhea: Resolved.   4.  Thyroid nodule: Biopsy reported as benign.  Likely multinodular goiter. 5.  Left breast nodule: 7 mm lesion noted on MRI, patient will have biopsy in the near future plan results of this biopsy will likely not change the overall treatment plan. 6.  Anemia: Mild, monitor. 7.   Leukocytosis: Likely secondary to Udenyca, monitor.   Patient expressed understanding and was in agreement with this plan. She also understands that She can call clinic at any time with any questions, concerns, or complaints.   Cancer Staging Cancer of central portion of right female breast [facility]) Staging form: Breast, AJCC 8th Edition - Clinical stage from 01/13/2019: Stage IIA (cT2, cN1, cM0, G1, ER+, PR+, HER2-) - Signed by Lloyd Huger, MD on 01/13/2019   Lloyd Huger, MD   06/10/2019 9:42 AM

## 2019-06-04 ENCOUNTER — Other Ambulatory Visit: Payer: Self-pay | Admitting: *Deleted

## 2019-06-04 DIAGNOSIS — C50111 Malignant neoplasm of central portion of right female breast: Secondary | ICD-10-CM

## 2019-06-04 DIAGNOSIS — Z08 Encounter for follow-up examination after completed treatment for malignant neoplasm: Secondary | ICD-10-CM

## 2019-06-07 ENCOUNTER — Other Ambulatory Visit: Payer: Self-pay | Admitting: General Surgery

## 2019-06-07 ENCOUNTER — Ambulatory Visit
Admission: RE | Admit: 2019-06-07 | Discharge: 2019-06-07 | Disposition: A | Discharge status: Home / Self Care | Payer: Medicare Other | Source: Ambulatory Visit | Attending: General Surgery | Admitting: General Surgery

## 2019-06-07 ENCOUNTER — Ambulatory Visit: Admission: RE | Admit: 2019-06-07 | Payer: Medicare Other | Source: Ambulatory Visit

## 2019-06-07 ENCOUNTER — Other Ambulatory Visit: Payer: Self-pay

## 2019-06-07 DIAGNOSIS — N6489 Other specified disorders of breast: Secondary | ICD-10-CM | POA: Diagnosis not present

## 2019-06-07 DIAGNOSIS — R9389 Abnormal findings on diagnostic imaging of other specified body structures: Secondary | ICD-10-CM

## 2019-06-07 MED ORDER — GADOBUTROL 1 MMOL/ML IV SOLN
10.0000 mL | Freq: Once | INTRAVENOUS | Status: AC | PRN
  Administered 2019-06-07: 10 mL via INTRAVENOUS

## 2019-06-09 ENCOUNTER — Other Ambulatory Visit: Payer: Self-pay

## 2019-06-09 ENCOUNTER — Encounter: Payer: Self-pay | Admitting: Oncology

## 2019-06-09 ENCOUNTER — Encounter
Admission: RE | Admit: 2019-06-09 | Discharge: 2019-06-09 | Disposition: A | Discharge status: Home / Self Care | Payer: Medicare Other | Source: Ambulatory Visit | Attending: Oncology | Admitting: Oncology

## 2019-06-09 DIAGNOSIS — T451X5A Adverse effect of antineoplastic and immunosuppressive drugs, initial encounter: Secondary | ICD-10-CM | POA: Insufficient documentation

## 2019-06-09 DIAGNOSIS — Z5111 Encounter for antineoplastic chemotherapy: Secondary | ICD-10-CM | POA: Diagnosis not present

## 2019-06-09 DIAGNOSIS — C50111 Malignant neoplasm of central portion of right female breast: Secondary | ICD-10-CM | POA: Insufficient documentation

## 2019-06-09 DIAGNOSIS — Z08 Encounter for follow-up examination after completed treatment for malignant neoplasm: Secondary | ICD-10-CM

## 2019-06-09 DIAGNOSIS — C50911 Malignant neoplasm of unspecified site of right female breast: Secondary | ICD-10-CM | POA: Diagnosis not present

## 2019-06-09 MED ORDER — TECHNETIUM TC 99M-LABELED RED BLOOD CELLS IV KIT
25.0000 | PACK | Freq: Once | INTRAVENOUS | Status: AC | PRN
  Administered 2019-06-09: 22.879 via INTRAVENOUS

## 2019-06-09 NOTE — Progress Notes (Signed)
Pre-visit assessment notes:  Pt reports that she was scheduled to have an MRI and breast biopsy yesterday. MRI was completed and no lesion was identified, so biopsy was not done. She is doing well overall, but reports feeling very ill after her Udenyca shots. She has significant weakness, fatigue, nausea, and diarrhea for approx. 48 hours after the shot. Otherwise, she has no concerns.

## 2019-06-10 ENCOUNTER — Inpatient Hospital Stay (HOSPITAL_BASED_OUTPATIENT_CLINIC_OR_DEPARTMENT_OTHER): Payer: Medicare Other | Admitting: Oncology

## 2019-06-10 ENCOUNTER — Inpatient Hospital Stay: Payer: Medicare Other

## 2019-06-10 ENCOUNTER — Other Ambulatory Visit: Payer: Self-pay

## 2019-06-10 VITALS — Temp 96.3°F

## 2019-06-10 VITALS — BP 121/85 | HR 59 | Resp 20 | Wt 223.0 lb

## 2019-06-10 DIAGNOSIS — Z170 Estrogen receptor positive status [ER+]: Secondary | ICD-10-CM | POA: Diagnosis not present

## 2019-06-10 DIAGNOSIS — D649 Anemia, unspecified: Secondary | ICD-10-CM | POA: Diagnosis not present

## 2019-06-10 DIAGNOSIS — C773 Secondary and unspecified malignant neoplasm of axilla and upper limb lymph nodes: Secondary | ICD-10-CM | POA: Diagnosis not present

## 2019-06-10 DIAGNOSIS — C50111 Malignant neoplasm of central portion of right female breast: Secondary | ICD-10-CM

## 2019-06-10 DIAGNOSIS — Z5189 Encounter for other specified aftercare: Secondary | ICD-10-CM | POA: Diagnosis not present

## 2019-06-10 DIAGNOSIS — Z5111 Encounter for antineoplastic chemotherapy: Secondary | ICD-10-CM | POA: Diagnosis not present

## 2019-06-10 LAB — COMPREHENSIVE METABOLIC PANEL
ALT: 15 U/L (ref 0–44)
AST: 18 U/L (ref 15–41)
Albumin: 3.9 g/dL (ref 3.5–5.0)
Alkaline Phosphatase: 76 U/L (ref 38–126)
Anion gap: 9 (ref 5–15)
BUN: 17 mg/dL (ref 8–23)
CO2: 26 mmol/L (ref 22–32)
Calcium: 9.2 mg/dL (ref 8.9–10.3)
Chloride: 105 mmol/L (ref 98–111)
Creatinine, Ser: 0.94 mg/dL (ref 0.44–1.00)
GFR calc Af Amer: >60 mL/min (ref >60)
GFR calc non Af Amer: >60 mL/min (ref >60)
Glucose, Bld: 142 mg/dL — ABNORMAL HIGH (ref 70–99)
Potassium: 4.2 mmol/L (ref 3.5–5.1)
Sodium: 140 mmol/L (ref 135–145)
Total Bilirubin: 0.4 mg/dL (ref 0.3–1.2)
Total Protein: 6.4 g/dL — ABNORMAL LOW (ref 6.5–8.1)

## 2019-06-10 LAB — CBC WITH DIFFERENTIAL/PLATELET
Abs Immature Granulocytes: 1.7 10*3/uL — ABNORMAL HIGH (ref 0.00–0.07)
Band Neutrophils: 10 %
Basophils Absolute: 0 10*3/uL (ref 0.0–0.1)
Basophils Relative: 0 %
Eosinophils Absolute: 0.2 10*3/uL (ref 0.0–0.5)
Eosinophils Relative: 1 %
HCT: 33.3 % — ABNORMAL LOW (ref 36.0–46.0)
Hemoglobin: 10.6 g/dL — ABNORMAL LOW (ref 12.0–15.0)
Lymphocytes Relative: 15 %
Lymphs Abs: 2.3 10*3/uL (ref 0.7–4.0)
MCH: 25.9 pg — ABNORMAL LOW (ref 26.0–34.0)
MCHC: 31.8 g/dL (ref 30.0–36.0)
MCV: 81.4 fL (ref 80.0–100.0)
Metamyelocytes Relative: 7 %
Monocytes Absolute: 0.9 10*3/uL (ref 0.1–1.0)
Monocytes Relative: 6 %
Myelocytes: 4 %
Neutro Abs: 10.5 10*3/uL — ABNORMAL HIGH (ref 1.7–7.7)
Neutrophils Relative %: 57 %
Platelets: 228 10*3/uL (ref 150–400)
RBC: 4.09 MIL/uL (ref 3.87–5.11)
RDW: 13.9 % (ref 11.5–15.5)
Smear Review: NORMAL
WBC Morphology: 11
WBC: 15.6 10*3/uL — ABNORMAL HIGH (ref 4.0–10.5)
nRBC: 0.2 % (ref 0.0–0.2)

## 2019-06-10 MED ORDER — SODIUM CHLORIDE 0.9 % IV SOLN
Freq: Once | INTRAVENOUS | Status: AC
  Administered 2019-06-10: 10:00:00 via INTRAVENOUS
  Filled 2019-06-10: qty 250

## 2019-06-10 MED ORDER — HEPARIN SOD (PORK) LOCK FLUSH 100 UNIT/ML IV SOLN
500.0000 [IU] | Freq: Once | INTRAVENOUS | Status: AC | PRN
  Administered 2019-06-10: 500 [IU]
  Filled 2019-06-10: qty 5

## 2019-06-10 MED ORDER — SODIUM CHLORIDE 0.9 % IV SOLN
600.0000 mg/m2 | Freq: Once | INTRAVENOUS | Status: AC
  Administered 2019-06-10: 11:00:00 1320 mg via INTRAVENOUS
  Filled 2019-06-10: qty 50

## 2019-06-10 MED ORDER — SODIUM CHLORIDE 0.9 % IV SOLN
Freq: Once | INTRAVENOUS | Status: AC
  Administered 2019-06-10: 10:00:00 via INTRAVENOUS
  Filled 2019-06-10: qty 5

## 2019-06-10 MED ORDER — PALONOSETRON HCL INJECTION 0.25 MG/5ML
0.2500 mg | Freq: Once | INTRAVENOUS | Status: AC
  Administered 2019-06-10: 10:00:00 0.25 mg via INTRAVENOUS
  Filled 2019-06-10: qty 5

## 2019-06-10 MED ORDER — DOXORUBICIN HCL CHEMO IV INJECTION 2 MG/ML
60.0000 mg/m2 | Freq: Once | INTRAVENOUS | Status: AC
  Administered 2019-06-10: 132 mg via INTRAVENOUS
  Filled 2019-06-10: qty 66

## 2019-06-11 ENCOUNTER — Inpatient Hospital Stay: Payer: Medicare Other

## 2019-06-11 ENCOUNTER — Other Ambulatory Visit: Payer: Self-pay

## 2019-06-11 DIAGNOSIS — C50111 Malignant neoplasm of central portion of right female breast: Secondary | ICD-10-CM

## 2019-06-11 DIAGNOSIS — C773 Secondary and unspecified malignant neoplasm of axilla and upper limb lymph nodes: Secondary | ICD-10-CM | POA: Diagnosis not present

## 2019-06-11 DIAGNOSIS — Z170 Estrogen receptor positive status [ER+]: Secondary | ICD-10-CM | POA: Diagnosis not present

## 2019-06-11 DIAGNOSIS — Z5111 Encounter for antineoplastic chemotherapy: Secondary | ICD-10-CM | POA: Diagnosis not present

## 2019-06-11 DIAGNOSIS — Z5189 Encounter for other specified aftercare: Secondary | ICD-10-CM | POA: Diagnosis not present

## 2019-06-11 DIAGNOSIS — D649 Anemia, unspecified: Secondary | ICD-10-CM | POA: Diagnosis not present

## 2019-06-11 MED ORDER — PEGFILGRASTIM-CBQV 6 MG/0.6ML ~~LOC~~ SOSY
6.0000 mg | PREFILLED_SYRINGE | Freq: Once | SUBCUTANEOUS | Status: AC
  Administered 2019-06-11: 6 mg via SUBCUTANEOUS

## 2019-06-16 ENCOUNTER — Ambulatory Visit: Payer: Self-pay

## 2019-06-20 NOTE — Progress Notes (Signed)
[place]  Telephone:(336) [phone] Fax:(336) [phone]  ID: Nichole Martinez OB: 08-Oct-1950  MR#: [id number]  TGG#:[id number]  Patient Care Team: [name], PA-C as PCP - General (Family Medicine)  CHIEF COMPLAINT: Clinical stage IIa ER/PR positive, HER-2 negative invasive carcinoma of the central portion of the right breast.  INTERVAL HISTORY: Patient returns to clinic today for further evaluation and consideration of cycle 4 of 4 of Adriamycin and Cytoxan with Udenyca support.  Patient noted worsening weakness and fatigue with significant dyspnea on exertion after her last treatment, which has since resolved.  She otherwise has felt well. She has no neurologic complaints.  She denies any recent fevers or illnesses. She has a good appetite and denies weight loss.  She denies any chest pain, shortness of breath, cough, or hemoptysis.  She denies any nausea, vomiting, constipation, or diarrhea.  She has no urinary complaints.  Patient offers no further specific complaints today.  REVIEW OF SYSTEMS:   Review of Systems  Constitutional: Negative.  Negative for fever, malaise/fatigue and weight loss.  Respiratory: Negative.  Negative for cough, hemoptysis and shortness of breath.   Cardiovascular: Negative.  Negative for chest pain and leg swelling.  Gastrointestinal: Negative.  Negative for abdominal pain, diarrhea and nausea.  Genitourinary: Negative.  Negative for dysuria.  Musculoskeletal: Negative.  Negative for back pain and myalgias.  Skin: Negative.  Negative for rash.  Neurological: Negative.  Negative for focal weakness, weakness and headaches.  Psychiatric/Behavioral: Negative.  The patient is not nervous/anxious.     As per HPI. Otherwise, a complete review of systems is negative.  PAST MEDICAL HISTORY: Past Medical History:  Diagnosis Date  . Allergy   . Breast cancer, right ([place]) 2020   Currently taking hormonal chemo tx's.   . Diabetes  mellitus without complication ([place]) 7035  . Hyperlipidemia   . Hypertension   . Osteoporosis   . Trigeminal neuralgia     PAST SURGICAL HISTORY: Past Surgical History:  Procedure Laterality Date  . ABDOMINAL HYSTERECTOMY  1978  . BREAST BIOPSY Right 01/06/2019   [place] bx x 2.  Ribbon marker for mass and hydro for Lymph node. Path pending  . BREAST CYST EXCISION Right 1986  . BREAST EXCISIONAL BIOPSY    . IR IMAGING GUIDED PORT INSERTION  05/12/2019  . LAMINECTOMY  1995   L1, L2, L3  . SPINE SURGERY      FAMILY HISTORY: Family History  Adopted: Yes  Family history unknown: Yes    ADVANCED DIRECTIVES (Y/N):  N  HEALTH MAINTENANCE: Social History   Tobacco Use  . Smoking status: Former Smoker    Quit date: 03/16/2000    Years since quitting: 19.2  . Smokeless tobacco: Never Used  Substance Use Topics  . Alcohol use: No  . Drug use: No     Colonoscopy:  PAP:  Bone density:  Lipid panel:  Allergies  Allergen Reactions  . Morphine     Anaphallaxis.    Current Outpatient Medications  Medication Sig Dispense Refill  . ALPRAZolam (XANAX) 1 MG tablet TAKE ONE TABLET BY MOUTH AT BEDTIME AS NEEDED FOR ANXIETY 90 tablet 1  . amLODipine (NORVASC) 5 MG tablet Take 1 tablet (5 mg total) by mouth daily. 90 tablet 3  . baclofen (LIORESAL) 10 MG tablet Take 1 tablet (10 mg total) by mouth daily. 90 tablet 3  . bisoprolol-hydrochlorothiazide (ZIAC) 10-6.25 MG tablet Take 1 tablet by mouth 2 (two) times daily. 180 tablet 3  .  buPROPion (WELLBUTRIN XL) 150 MG 24 hr tablet Take 1 tablet (150 mg total) by mouth 2 (two) times a day. 180 tablet 1  . Cholecalciferol (VITAMIN D3) 5000 units CAPS Take by mouth.    . letrozole (FEMARA) 2.5 MG tablet Take 1 tablet (2.5 mg total) by mouth daily. 90 tablet 3  . lidocaine-prilocaine (EMLA) cream Apply to affected area once 30 g 3  . lisinopril-hydrochlorothiazide (ZESTORETIC) 10-12.5 MG tablet Take 1 tablet by mouth daily. 90 tablet 3  .  loratadine (CLARITIN) 10 MG tablet Take 1 tablet (10 mg total) by mouth daily as needed. 90 tablet 3  . meloxicam (MOBIC) 15 MG tablet Take 1 tablet (15 mg total) by mouth daily. 90 tablet 3  . metFORMIN (GLUCOPHAGE) 1000 MG tablet Take 1 tablet (1,000 mg total) by mouth 2 (two) times daily with a meal. 180 tablet 3  . MULTIPLE VITAMINS PO MULTIVITAMINS (Oral Tablet)  1 Every Day for 0 days  Quantity: 0.00;  Refills: 0   Ordered :17-Apr-2011  [name] ;  Started 24-February-2009 Active    . OMEGA 3-6-9 FATTY ACIDS PO Reported on 10/04/2015    . ondansetron (ZOFRAN) 8 MG tablet Take 1 tablet (8 mg total) by mouth 2 (two) times daily as needed. 30 tablet 2  . prochlorperazine (COMPAZINE) 10 MG tablet Take 1 tablet (10 mg total) by mouth every 6 (six) hours as needed (Nausea or vomiting). 60 tablet 2  . simvastatin (ZOCOR) 20 MG tablet Take 1 tablet (20 mg total) by mouth at bedtime. 90 tablet 3  . Vitamin D, Ergocalciferol, (DRISDOL) 1.25 MG (50000 UT) CAPS capsule Take 1 capsule (50,000 Units total) by mouth every 7 (seven) days. 12 capsule 4  . vitamin E 1000 UNIT capsule Take 1,000 Units by mouth daily.    . rizatriptan (MAXALT) 5 MG tablet TAKE ONE TABLET BY MOUTH AS NEEDED FOR MIGRAINE. MAY REPEAT IN 2 HOURS IF NEEDED. 10 tablet 11   No current facility-administered medications for this visit.     OBJECTIVE: Vitals:   06/24/19 0928  BP: 96/67  Pulse: 63  Temp: (!) 97.3 F (36.3 C)     Body mass index is 34.02 kg/m.    ECOG FS:0 - Asymptomatic  General: Well-developed, well-nourished, no acute distress. Eyes: Pink conjunctiva, anicteric sclera. HEENT: Normocephalic, moist mucous membranes. Breast: Exam deferred today. Lungs: Clear to auscultation bilaterally. Heart: Regular rate and rhythm. No rubs, murmurs, or gallops. Abdomen: Soft, nontender, nondistended. No organomegaly noted, normoactive bowel sounds. Musculoskeletal: No edema, cyanosis, or clubbing. Neuro: Alert, answering all  questions appropriately. Cranial nerves grossly intact. Skin: No rashes or petechiae noted. Psych: Normal affect.  LAB RESULTS:  Lab Results  Component Value Date   NA 139 06/24/2019   K 4.3 06/24/2019   CL 106 06/24/2019   CO2 24 06/24/2019   GLUCOSE 141 (H) 06/24/2019   BUN 16 06/24/2019   CREATININE 0.79 06/24/2019   CALCIUM 9.1 06/24/2019   PROT 6.5 06/24/2019   ALBUMIN 4.0 06/24/2019   AST 15 06/24/2019   ALT 14 06/24/2019   ALKPHOS 74 06/24/2019   BILITOT 0.3 06/24/2019   GFRNONAA >60 06/24/2019   GFRAA >60 06/24/2019    Lab Results  Component Value Date   WBC 16.4 (H) 06/24/2019   NEUTROABS 11.8 (H) 06/24/2019   HGB 10.4 (L) 06/24/2019   HCT 31.9 (L) 06/24/2019   MCV 81.2 06/24/2019   PLT 215 06/24/2019     STUDIES: Nm Cardiac Muga  Rest  Result Date: 06/09/2019 CLINICAL DATA:  Breast cancer, on cardiotoxic chemotherapy EXAM: NUCLEAR MEDICINE CARDIAC BLOOD POOL IMAGING (MUGA) TECHNIQUE: Cardiac multi-gated acquisition was performed at rest following intravenous injection of Tc-57mlabeled red blood cells. RADIOPHARMACEUTICALS:  22.87 mCi Tc-975mertechnetate in-vitro labeled red blood cells IV COMPARISON:  05/05/2019 FINDINGS: Calculated LEFT ventricular ejection fraction is 52.2%, not significantly changed from the 52.8% on the previous exam. Study was obtained at a cardiac rate of 56 bpm. Patient was rhythmic during imaging. Cine analysis of the LEFT ventricle in 3 projections demonstrates normal LEFT ventricular wall motion. IMPRESSION: Stable LEFT ventricular ejection fraction of 52.2% not significantly changed from the 52.8% on the prior study. No focal wall motion abnormalities identified. Electronically Signed   By: MaLavonia Dana.D.   On: 06/09/2019 13:43   Mr Breast Left W Wo Contrast Inc Cad  Result Date: 06/07/2019 CLINICAL DATA:  Patient presents for MR guided core biopsy of the LEFT breast. The patient's planning RIGHT mastectomy for known RIGHT breast  cancer. LABS:  None obtained at the time of imaging. EXAM: MR OF THE LEFT BREAST WITH AND WITHOUT CONTRAST TECHNIQUE: Multiplanar multisequence MR images of the left breast were obtained prior to and following the intravenous administration of 10 ml of Gadavist. Three-dimensional MR images were rendered by post-processing of the original MR data on an independent workstation. The three-dimensional MR images were interpreted, and findings are reported in the following complete MRI report for this study. Three dimensional images were evaluated at the independent DynaCad workstation COMPARISON:  MRI 05/11/2019 and earlier breast imaging studies FINDINGS: Breast composition: c. Heterogeneous fibroglandular tissue. Background parenchymal enhancement: Mild Left breast: The 7 millimeter enhancing mass previously seen in the posterior OUTER portion of the LEFT breast is no longer identified. No new or suspicious findings in the LEFT breast. Lymph nodes: No abnormal appearing lymph nodes. Ancillary findings: Known RIGHT breast cancer is partially imaged. LEFT-sided Port-A-Cath. IMPRESSION: 1. The enhancing mass previous exam the LEFT breast is no longer apparent. 2. Biopsy is not performed today. RECOMMENDATION: Recommend follow-up bilateral breast MRI with and without contrast in 6 months to re-evaluate the LEFT breast. Treatment plan for known RIGHT breast cancer. BI-RADS CATEGORY  1: Negative. Electronically Signed   By: ElNolon Nations.D.   On: 06/07/2019 09:48    ASSESSMENT: Clinical stage IIa ER/PR positive, HER-2 negative invasive carcinoma of the central portion of the right breast.  Oncotype DX score 7.  PLAN:   1. Clinical stage IIa ER/PR positive, HER-2 negative invasive carcinoma of the central portion of the right breast: Although patient is clinical stage IIa and typically neoadjuvant chemotherapy is the recommendation, patient did not wish to pursue aggressive immunosuppressive treatment during the  COVID-19 pandemic.  Because of this, patient initiated neoadjuvant letrozole in approximately April 2020.  Patient is now more comfortable initiating treatment and has agreed to neoadjuvant chemotherapy using Adriamycin, Cytoxan with Udenyca support followed by weekly Taxol x12.  Recent MRI results noted.  Repeat MUGA on June 09, 2019 revealed an ejection fraction of 52.2% which is unchanged from prior.  Proceed with cycle 4 of Adriamycin and Cytoxan today.  Return to clinic tomorrow for [facility] then in 2 weeks for further evaluation and consideration of cycle 1 of 12 of weekly Taxol.   2.  COVID positivity: Resolved.  Patient reports repeat testing is negative.  She is no longer under quarantine.  It is likely that the groundglass densities seen on her  CT scan from March 17, 2019 are residual from her infection.  No intervention is needed at this time. 3.  Nausea and diarrhea: Resolved.   4.  Thyroid nodule: Biopsy reported as benign.  Likely multinodular goiter. 5.  Left breast nodule: 7 mm lesion noted on MRI, monitor. 6.  Anemia: Hemoglobin is decreased, but stable at 10.4. 7.  Leukocytosis: Likely secondary to [facility], monitor.   Patient expressed understanding and was in agreement with this plan. She also understands that She can call clinic at any time with any questions, concerns, or complaints.   Cancer Staging Cancer of central portion of right female breast [facility]) Staging form: Breast, AJCC 8th Edition - Clinical stage from 01/13/2019: Stage IIA (cT2, cN1, cM0, G1, ER+, PR+, HER2-) - Signed by Lloyd Huger, MD on 01/13/2019   Lloyd Huger, MD   06/27/2019 3:58 PM

## 2019-06-23 ENCOUNTER — Encounter: Payer: Self-pay | Admitting: Oncology

## 2019-06-23 ENCOUNTER — Other Ambulatory Visit: Payer: Self-pay

## 2019-06-23 ENCOUNTER — Other Ambulatory Visit: Payer: Self-pay | Admitting: Physician Assistant

## 2019-06-23 DIAGNOSIS — G43109 Migraine with aura, not intractable, without status migrainosus: Secondary | ICD-10-CM

## 2019-06-23 NOTE — Progress Notes (Signed)
Patient pre screened for office appointment, no questions or concerns today. 

## 2019-06-24 ENCOUNTER — Inpatient Hospital Stay: Payer: Medicare Other

## 2019-06-24 ENCOUNTER — Inpatient Hospital Stay: Payer: Medicare Other | Attending: Oncology

## 2019-06-24 ENCOUNTER — Other Ambulatory Visit: Payer: Self-pay

## 2019-06-24 ENCOUNTER — Inpatient Hospital Stay (HOSPITAL_BASED_OUTPATIENT_CLINIC_OR_DEPARTMENT_OTHER): Payer: Medicare Other | Admitting: Oncology

## 2019-06-24 VITALS — BP 96/67 | HR 63 | Temp 97.3°F | Wt 217.2 lb

## 2019-06-24 DIAGNOSIS — Z170 Estrogen receptor positive status [ER+]: Secondary | ICD-10-CM | POA: Insufficient documentation

## 2019-06-24 DIAGNOSIS — Z5111 Encounter for antineoplastic chemotherapy: Secondary | ICD-10-CM | POA: Insufficient documentation

## 2019-06-24 DIAGNOSIS — C50111 Malignant neoplasm of central portion of right female breast: Secondary | ICD-10-CM

## 2019-06-24 DIAGNOSIS — Z5189 Encounter for other specified aftercare: Secondary | ICD-10-CM | POA: Insufficient documentation

## 2019-06-24 LAB — CBC WITH DIFFERENTIAL/PLATELET
Abs Immature Granulocytes: 1.3 10*3/uL — ABNORMAL HIGH (ref 0.00–0.07)
Band Neutrophils: 10 %
Basophils Absolute: 0 10*3/uL (ref 0.0–0.1)
Basophils Relative: 0 %
Eosinophils Absolute: 0 10*3/uL (ref 0.0–0.5)
Eosinophils Relative: 0 %
HCT: 31.9 % — ABNORMAL LOW (ref 36.0–46.0)
Hemoglobin: 10.4 g/dL — ABNORMAL LOW (ref 12.0–15.0)
Lymphocytes Relative: 16 %
Lymphs Abs: 2.6 10*3/uL (ref 0.7–4.0)
MCH: 26.5 pg (ref 26.0–34.0)
MCHC: 32.6 g/dL (ref 30.0–36.0)
MCV: 81.2 fL (ref 80.0–100.0)
Metamyelocytes Relative: 4 %
Monocytes Absolute: 0.7 10*3/uL (ref 0.1–1.0)
Monocytes Relative: 4 %
Myelocytes: 4 %
Neutro Abs: 11.8 10*3/uL — ABNORMAL HIGH (ref 1.7–7.7)
Neutrophils Relative %: 62 %
Platelets: 215 10*3/uL (ref 150–400)
RBC: 3.93 MIL/uL (ref 3.87–5.11)
RDW: 14.8 % (ref 11.5–15.5)
Smear Review: NORMAL
WBC: 16.4 10*3/uL — ABNORMAL HIGH (ref 4.0–10.5)
nRBC: 0.2 % (ref 0.0–0.2)

## 2019-06-24 LAB — COMPREHENSIVE METABOLIC PANEL
ALT: 14 U/L (ref 0–44)
AST: 15 U/L (ref 15–41)
Albumin: 4 g/dL (ref 3.5–5.0)
Alkaline Phosphatase: 74 U/L (ref 38–126)
Anion gap: 9 (ref 5–15)
BUN: 16 mg/dL (ref 8–23)
CO2: 24 mmol/L (ref 22–32)
Calcium: 9.1 mg/dL (ref 8.9–10.3)
Chloride: 106 mmol/L (ref 98–111)
Creatinine, Ser: 0.79 mg/dL (ref 0.44–1.00)
GFR calc Af Amer: >60 mL/min (ref >60)
GFR calc non Af Amer: >60 mL/min (ref >60)
Glucose, Bld: 141 mg/dL — ABNORMAL HIGH (ref 70–99)
Potassium: 4.3 mmol/L (ref 3.5–5.1)
Sodium: 139 mmol/L (ref 135–145)
Total Bilirubin: 0.3 mg/dL (ref 0.3–1.2)
Total Protein: 6.5 g/dL (ref 6.5–8.1)

## 2019-06-24 MED ORDER — HEPARIN SOD (PORK) LOCK FLUSH 100 UNIT/ML IV SOLN
500.0000 [IU] | Freq: Once | INTRAVENOUS | Status: AC | PRN
  Administered 2019-06-24: 500 [IU]
  Filled 2019-06-24: qty 5

## 2019-06-24 MED ORDER — PALONOSETRON HCL INJECTION 0.25 MG/5ML
0.2500 mg | Freq: Once | INTRAVENOUS | Status: AC
  Administered 2019-06-24: 0.25 mg via INTRAVENOUS
  Filled 2019-06-24: qty 5

## 2019-06-24 MED ORDER — SODIUM CHLORIDE 0.9 % IV SOLN
Freq: Once | INTRAVENOUS | Status: AC
  Administered 2019-06-24: 10:00:00 via INTRAVENOUS
  Filled 2019-06-24: qty 250

## 2019-06-24 MED ORDER — SODIUM CHLORIDE 0.9 % IV SOLN
Freq: Once | INTRAVENOUS | Status: AC
  Administered 2019-06-24: 11:00:00 via INTRAVENOUS
  Filled 2019-06-24: qty 5

## 2019-06-24 MED ORDER — DOXORUBICIN HCL CHEMO IV INJECTION 2 MG/ML
60.0000 mg/m2 | Freq: Once | INTRAVENOUS | Status: AC
  Administered 2019-06-24: 132 mg via INTRAVENOUS
  Filled 2019-06-24: qty 66

## 2019-06-24 MED ORDER — SODIUM CHLORIDE 0.9 % IV SOLN
600.0000 mg/m2 | Freq: Once | INTRAVENOUS | Status: AC
  Administered 2019-06-24: 1320 mg via INTRAVENOUS
  Filled 2019-06-24: qty 50

## 2019-06-24 NOTE — Progress Notes (Signed)
Patient c/o SOB with exertion after last treatment, better this week.  Patient states that she is not sleeping well.

## 2019-06-25 ENCOUNTER — Inpatient Hospital Stay: Payer: Medicare Other

## 2019-06-25 ENCOUNTER — Other Ambulatory Visit: Payer: Self-pay

## 2019-06-25 ENCOUNTER — Encounter: Payer: BC Managed Care – PPO | Admitting: Physician Assistant

## 2019-06-25 DIAGNOSIS — C50111 Malignant neoplasm of central portion of right female breast: Secondary | ICD-10-CM | POA: Diagnosis not present

## 2019-06-25 DIAGNOSIS — Z170 Estrogen receptor positive status [ER+]: Secondary | ICD-10-CM

## 2019-06-25 DIAGNOSIS — Z5189 Encounter for other specified aftercare: Secondary | ICD-10-CM | POA: Diagnosis not present

## 2019-06-25 DIAGNOSIS — Z5111 Encounter for antineoplastic chemotherapy: Secondary | ICD-10-CM | POA: Diagnosis not present

## 2019-06-25 MED ORDER — PEGFILGRASTIM-CBQV 6 MG/0.6ML ~~LOC~~ SOSY
6.0000 mg | PREFILLED_SYRINGE | Freq: Once | SUBCUTANEOUS | Status: AC
  Administered 2019-06-25: 6 mg via SUBCUTANEOUS

## 2019-06-30 ENCOUNTER — Ambulatory Visit: Payer: Medicare Other

## 2019-06-30 ENCOUNTER — Telehealth: Payer: Self-pay | Admitting: *Deleted

## 2019-06-30 NOTE — Telephone Encounter (Signed)
Patient called reporting that she has a horrible toothache and she called her dentist for appointment. He is refusing to see her until he receives a letter from [place] on Letterhead that is is alright to treat her and any precautions he may need to take. Please call patient when letter is ready. [phone]. She needs this ASAP

## 2019-06-30 NOTE — Telephone Encounter (Signed)
Patient was called to let her know that her letter was ready for pick up and that it would be left at the front desk.

## 2019-06-30 NOTE — Telephone Encounter (Signed)
That's fine.  She will be getting taxol next week. Recommend seeing dentist 1-2 days prior to treatment.

## 2019-07-03 NOTE — Progress Notes (Signed)
[name]  Telephone:(336) [phone] Fax:(336) [phone]  ID: Nichole Martinez OB: 04-20-1951  MR#: [id number]  MEQ#:[id number]  Patient Care Team: [name], PA-C as PCP - General (Family Medicine)  CHIEF COMPLAINT: Clinical stage IIa ER/PR positive, HER-2 negative invasive carcinoma of the central portion of the right breast.  INTERVAL HISTORY: Patient returns to clinic today for further evaluation and consideration of cycle 1 of 12 of weekly Taxol.  She had some dental work done this past week and feels significantly improved.  She denies any weakness or fatigue. She has no neurologic complaints.  She denies any recent fevers or illnesses. She has a good appetite and denies weight loss.  She has no chest pain, shortness of breath, cough, or hemoptysis.  She denies any nausea, vomiting, constipation, or diarrhea.  She has no urinary complaints.  Patient offers no specific complaints today.  REVIEW OF SYSTEMS:   Review of Systems  Constitutional: Negative.  Negative for fever, malaise/fatigue and weight loss.  Respiratory: Negative.  Negative for cough, hemoptysis and shortness of breath.   Cardiovascular: Negative.  Negative for chest pain and leg swelling.  Gastrointestinal: Negative.  Negative for abdominal pain, diarrhea and nausea.  Genitourinary: Negative.  Negative for dysuria.  Musculoskeletal: Negative.  Negative for back pain and myalgias.  Skin: Negative.  Negative for rash.  Neurological: Negative.  Negative for focal weakness, weakness and headaches.  Psychiatric/Behavioral: Negative.  The patient is not nervous/anxious.     As per HPI. Otherwise, a complete review of systems is negative.  PAST MEDICAL HISTORY: Past Medical History:  Diagnosis Date   Allergy    Breast cancer, right ([place]) 2020   Currently taking hormonal chemo tx's.    Diabetes mellitus without complication (Nichole Martinez) 2979   Hyperlipidemia    Hypertension    Osteoporosis     Trigeminal neuralgia     PAST SURGICAL HISTORY: Past Surgical History:  Procedure Laterality Date   ABDOMINAL HYSTERECTOMY  1978   BREAST BIOPSY Right 01/06/2019   [place] bx x 2.  Ribbon marker for mass and hydro for Lymph node. Path pending   BREAST CYST EXCISION Right 1986   BREAST EXCISIONAL BIOPSY     IR IMAGING GUIDED PORT INSERTION  05/12/2019   LAMINECTOMY  1995   L1, L2, L3   SPINE SURGERY      FAMILY HISTORY: Family History  Adopted: Yes  Family history unknown: Yes    ADVANCED DIRECTIVES (Y/N):  N  HEALTH MAINTENANCE: Social History   Tobacco Use   Smoking status: Former Smoker    Quit date: 03/16/2000    Years since quitting: 19.3   Smokeless tobacco: Never Used  Substance Use Topics   Alcohol use: No   Drug use: No     Colonoscopy:  PAP:  Bone density:  Lipid panel:  Allergies  Allergen Reactions   Morphine     Anaphallaxis.    Current Outpatient Medications  Medication Sig Dispense Refill   ALPRAZolam (XANAX) 1 MG tablet TAKE ONE TABLET BY MOUTH AT BEDTIME AS NEEDED FOR ANXIETY 90 tablet 1   amLODipine (NORVASC) 5 MG tablet Take 1 tablet (5 mg total) by mouth daily. 90 tablet 3   amoxicillin-clavulanate (AUGMENTIN XR) 1000-62.5 MG 12 hr tablet Take 2 tablets by mouth 2 (two) times daily.     baclofen (LIORESAL) 10 MG tablet Take 1 tablet (10 mg total) by mouth daily. 90 tablet 3   bisoprolol-hydrochlorothiazide (ZIAC) 10-6.25 MG tablet Take  1 tablet by mouth 2 (two) times daily. 180 tablet 3   buPROPion (WELLBUTRIN XL) 150 MG 24 hr tablet Take 1 tablet (150 mg total) by mouth 2 (two) times a day. 180 tablet 1   Cholecalciferol (VITAMIN D3) 5000 units CAPS Take by mouth.     letrozole (FEMARA) 2.5 MG tablet Take 1 tablet (2.5 mg total) by mouth daily. 90 tablet 3   lidocaine-prilocaine (EMLA) cream Apply to affected area once 30 g 3   lisinopril-hydrochlorothiazide (ZESTORETIC) 10-12.5 MG tablet Take 1 tablet by mouth daily.  90 tablet 3   loratadine (CLARITIN) 10 MG tablet Take 1 tablet (10 mg total) by mouth daily as needed. 90 tablet 3   meloxicam (MOBIC) 15 MG tablet Take 1 tablet (15 mg total) by mouth daily. 90 tablet 3   metFORMIN (GLUCOPHAGE) 1000 MG tablet Take 1 tablet (1,000 mg total) by mouth 2 (two) times daily with a meal. 180 tablet 3   MULTIPLE VITAMINS PO MULTIVITAMINS (Oral Tablet)  1 Every Day for 0 days  Quantity: 0.00;  Refills: 0   Ordered :17-Apr-2011  [name] ;  Started 24-February-2009 Active     OMEGA 3-6-9 FATTY ACIDS PO Reported on 10/04/2015     ondansetron (ZOFRAN) 8 MG tablet Take 1 tablet (8 mg total) by mouth 2 (two) times daily as needed. 30 tablet 2   prochlorperazine (COMPAZINE) 10 MG tablet Take 1 tablet (10 mg total) by mouth every 6 (six) hours as needed (Nausea or vomiting). 60 tablet 2   rizatriptan (MAXALT) 5 MG tablet TAKE ONE TABLET BY MOUTH AS NEEDED FOR MIGRAINE. MAY REPEAT IN 2 HOURS IF NEEDED. 10 tablet 11   simvastatin (ZOCOR) 20 MG tablet Take 1 tablet (20 mg total) by mouth at bedtime. 90 tablet 3   Vitamin D, Ergocalciferol, (DRISDOL) 1.25 MG (50000 UT) CAPS capsule Take 1 capsule (50,000 Units total) by mouth every 7 (seven) days. 12 capsule 4   vitamin E 1000 UNIT capsule Take 1,000 Units by mouth daily.     No current facility-administered medications for this visit.     OBJECTIVE: Vitals:   07/08/19 0917  BP: 107/71  Pulse: 62  Temp: 97.9 F (36.6 C)     Body mass index is 32.77 kg/m.    ECOG FS:0 - Asymptomatic  General: Well-developed, well-nourished, no acute distress. Eyes: Pink conjunctiva, anicteric sclera. HEENT: Normocephalic, moist mucous membranes. Lungs: Clear to auscultation bilaterally. Heart: Regular rate and rhythm. No rubs, murmurs, or gallops. Abdomen: Soft, nontender, nondistended. No organomegaly noted, normoactive bowel sounds. Musculoskeletal: No edema, cyanosis, or clubbing. Neuro: Alert, answering all questions  appropriately. Cranial nerves grossly intact. Skin: No rashes or petechiae noted. Psych: Normal affect.  LAB RESULTS:  Lab Results  Component Value Date   NA 140 07/08/2019   K 3.8 07/08/2019   CL 106 07/08/2019   CO2 25 07/08/2019   GLUCOSE 148 (H) 07/08/2019   BUN 15 07/08/2019   CREATININE 0.91 07/08/2019   CALCIUM 9.1 07/08/2019   PROT 6.8 07/08/2019   ALBUMIN 4.0 07/08/2019   AST 20 07/08/2019   ALT 16 07/08/2019   ALKPHOS 83 07/08/2019   BILITOT 0.6 07/08/2019   GFRNONAA >60 07/08/2019   GFRAA >60 07/08/2019    Lab Results  Component Value Date   WBC 13.6 (H) 07/08/2019   NEUTROABS 10.7 (H) 07/08/2019   HGB 9.8 (L) 07/08/2019   HCT 30.2 (L) 07/08/2019   MCV 81.8 07/08/2019   PLT 281 07/08/2019  STUDIES: Nm Cardiac Muga Rest  Result Date: 06/09/2019 CLINICAL DATA:  Breast cancer, on cardiotoxic chemotherapy EXAM: NUCLEAR MEDICINE CARDIAC BLOOD POOL IMAGING (MUGA) TECHNIQUE: Cardiac multi-gated acquisition was performed at rest following intravenous injection of Tc-33mlabeled red blood cells. RADIOPHARMACEUTICALS:  22.87 mCi Tc-954mertechnetate in-vitro labeled red blood cells IV COMPARISON:  05/05/2019 FINDINGS: Calculated LEFT ventricular ejection fraction is 52.2%, not significantly changed from the 52.8% on the previous exam. Study was obtained at a cardiac rate of 56 bpm. Patient was rhythmic during imaging. Cine analysis of the LEFT ventricle in 3 projections demonstrates normal LEFT ventricular wall motion. IMPRESSION: Stable LEFT ventricular ejection fraction of 52.2% not significantly changed from the 52.8% on the prior study. No focal wall motion abnormalities identified. Electronically Signed   By: MaLavonia Dana.D.   On: 06/09/2019 13:43    ASSESSMENT: Clinical stage IIa ER/PR positive, HER-2 negative invasive carcinoma of the central portion of the right breast.  Oncotype DX score 7.  PLAN:   1. Clinical stage IIa ER/PR positive, HER-2 negative  invasive carcinoma of the central portion of the right breast: Although patient is clinical stage IIa and typically neoadjuvant chemotherapy is the recommendation, patient did not wish to pursue aggressive immunosuppressive treatment during the COVID-19 pandemic.  Because of this, patient initiated neoadjuvant letrozole in approximately April 2020.  Patient is now more comfortable initiating treatment and has agreed to neoadjuvant chemotherapy using Adriamycin, Cytoxan with Udenyca support followed by weekly Taxol x12.  Recent MRI results noted.  Repeat MUGA on June 09, 2019 revealed an ejection fraction of 52.2% which is unchanged from prior.  Proceed with cycle 1 of 12 of weekly Taxol today.  Return to clinic in 1 week for further evaluation and consideration of cycle 2.   2.  COVID positivity: Resolved.  Patient reports repeat testing is negative.  She is no longer under quarantine.  It is likely that the groundglass densities seen on her CT scan from March 17, 2019 are residual from her infection.  No intervention is needed at this time. 3.  Nausea and diarrhea: Resolved.   4.  Thyroid nodule: Biopsy reported as benign.  Likely multinodular goiter. 5.  Left breast nodule: 7 mm lesion noted on MRI, monitor. 6.  Anemia: Hemoglobin has trended down slightly to 9.8, monitor. 7.  Leukocytosis: Improved.  [facility].   Patient expressed understanding and was in agreement with this plan. She also understands that She can call clinic at any time with any questions, concerns, or complaints.   Cancer Staging Cancer of central portion of right female breast ([facility] Center[facility]aging form: Breast, AJCC 8th Edition - Clinical stage from 01/13/2019: Stage IIA (cT2, cN1, cM0, G1, ER+, PR+, HER2-) - Signed by FiLloyd HugerMD on 01/13/2019   TiLloyd HugerMD   07/08/2019 3:02 PM

## 2019-07-07 ENCOUNTER — Other Ambulatory Visit: Payer: Self-pay

## 2019-07-07 ENCOUNTER — Ambulatory Visit: Payer: Medicare Other

## 2019-07-07 NOTE — Progress Notes (Signed)
Patient pre screened for office appointment, no questions or concerns today. 

## 2019-07-08 ENCOUNTER — Inpatient Hospital Stay: Payer: Medicare Other

## 2019-07-08 ENCOUNTER — Other Ambulatory Visit: Payer: Self-pay

## 2019-07-08 ENCOUNTER — Inpatient Hospital Stay (HOSPITAL_BASED_OUTPATIENT_CLINIC_OR_DEPARTMENT_OTHER): Payer: Medicare Other | Admitting: Oncology

## 2019-07-08 VITALS — BP 107/71 | HR 62 | Temp 97.9°F | Wt 209.2 lb

## 2019-07-08 DIAGNOSIS — Z5111 Encounter for antineoplastic chemotherapy: Secondary | ICD-10-CM | POA: Diagnosis not present

## 2019-07-08 DIAGNOSIS — C50111 Malignant neoplasm of central portion of right female breast: Secondary | ICD-10-CM

## 2019-07-08 DIAGNOSIS — Z170 Estrogen receptor positive status [ER+]: Secondary | ICD-10-CM | POA: Diagnosis not present

## 2019-07-08 DIAGNOSIS — Z5189 Encounter for other specified aftercare: Secondary | ICD-10-CM | POA: Diagnosis not present

## 2019-07-08 LAB — CBC WITH DIFFERENTIAL/PLATELET
Abs Immature Granulocytes: 1.1 10*3/uL — ABNORMAL HIGH (ref 0.00–0.07)
Band Neutrophils: 9 %
Basophils Absolute: 0 10*3/uL (ref 0.0–0.1)
Basophils Relative: 0 %
Eosinophils Absolute: 0.1 10*3/uL (ref 0.0–0.5)
Eosinophils Relative: 1 %
HCT: 30.2 % — ABNORMAL LOW (ref 36.0–46.0)
Hemoglobin: 9.8 g/dL — ABNORMAL LOW (ref 12.0–15.0)
Lymphocytes Relative: 6 %
Lymphs Abs: 0.8 10*3/uL (ref 0.7–4.0)
MCH: 26.6 pg (ref 26.0–34.0)
MCHC: 32.5 g/dL (ref 30.0–36.0)
MCV: 81.8 fL (ref 80.0–100.0)
Metamyelocytes Relative: 4 %
Monocytes Absolute: 0.8 10*3/uL (ref 0.1–1.0)
Monocytes Relative: 6 %
Myelocytes: 4 %
Neutro Abs: 10.7 10*3/uL — ABNORMAL HIGH (ref 1.7–7.7)
Neutrophils Relative %: 70 %
Platelets: 281 10*3/uL (ref 150–400)
RBC: 3.69 MIL/uL — ABNORMAL LOW (ref 3.87–5.11)
RDW: 16.6 % — ABNORMAL HIGH (ref 11.5–15.5)
Smear Review: NORMAL
WBC: 13.6 10*3/uL — ABNORMAL HIGH (ref 4.0–10.5)
nRBC: 0.3 % — ABNORMAL HIGH (ref 0.0–0.2)

## 2019-07-08 LAB — COMPREHENSIVE METABOLIC PANEL
ALT: 16 U/L (ref 0–44)
AST: 20 U/L (ref 15–41)
Albumin: 4 g/dL (ref 3.5–5.0)
Alkaline Phosphatase: 83 U/L (ref 38–126)
Anion gap: 9 (ref 5–15)
BUN: 15 mg/dL (ref 8–23)
CO2: 25 mmol/L (ref 22–32)
Calcium: 9.1 mg/dL (ref 8.9–10.3)
Chloride: 106 mmol/L (ref 98–111)
Creatinine, Ser: 0.91 mg/dL (ref 0.44–1.00)
GFR calc Af Amer: >60 mL/min (ref >60)
GFR calc non Af Amer: >60 mL/min (ref >60)
Glucose, Bld: 148 mg/dL — ABNORMAL HIGH (ref 70–99)
Potassium: 3.8 mmol/L (ref 3.5–5.1)
Sodium: 140 mmol/L (ref 135–145)
Total Bilirubin: 0.6 mg/dL (ref 0.3–1.2)
Total Protein: 6.8 g/dL (ref 6.5–8.1)

## 2019-07-08 MED ORDER — DEXAMETHASONE SODIUM PHOSPHATE 10 MG/ML IJ SOLN
10.0000 mg | Freq: Once | INTRAMUSCULAR | Status: AC
  Administered 2019-07-08: 10 mg via INTRAVENOUS
  Filled 2019-07-08: qty 1

## 2019-07-08 MED ORDER — SODIUM CHLORIDE 0.9 % IV SOLN
80.0000 mg/m2 | Freq: Once | INTRAVENOUS | Status: AC
  Administered 2019-07-08: 174 mg via INTRAVENOUS
  Filled 2019-07-08: qty 29

## 2019-07-08 MED ORDER — FAMOTIDINE IN NACL 20-0.9 MG/50ML-% IV SOLN
20.0000 mg | Freq: Once | INTRAVENOUS | Status: AC
  Administered 2019-07-08: 20 mg via INTRAVENOUS
  Filled 2019-07-08: qty 50

## 2019-07-08 MED ORDER — SODIUM CHLORIDE 0.9 % IV SOLN
Freq: Once | INTRAVENOUS | Status: AC
  Administered 2019-07-08: 10:00:00 via INTRAVENOUS
  Filled 2019-07-08: qty 250

## 2019-07-08 MED ORDER — SODIUM CHLORIDE 0.9 % IV SOLN: 10.0000 mg | Freq: Once | INTRAVENOUS | Status: DC

## 2019-07-08 MED ORDER — DIPHENHYDRAMINE HCL 50 MG/ML IJ SOLN
25.0000 mg | Freq: Once | INTRAMUSCULAR | Status: AC
  Administered 2019-07-08: 10:00:00 25 mg via INTRAVENOUS
  Filled 2019-07-08: qty 1

## 2019-07-08 MED ORDER — HEPARIN SOD (PORK) LOCK FLUSH 100 UNIT/ML IV SOLN
500.0000 [IU] | Freq: Once | INTRAVENOUS | Status: AC | PRN
  Administered 2019-07-08: 500 [IU]
  Filled 2019-07-08: qty 5

## 2019-07-11 NOTE — Progress Notes (Signed)
[place]  [phone]) [phone] [phone]) [phone]  ID: Nichole Martinez OB: 07-21-1951  MR#: [id number]  IHK#:[id number]  Patient Care Team: [name], PA-C as PCP - General (Family Medicine)  CHIEF COMPLAINT: Clinical stage IIa ER/PR positive, HER-2 negative invasive carcinoma of the central portion of the right breast.  INTERVAL HISTORY: Patient returns to clinic today for further evaluation and consideration of cycle 2 of 12 of weekly Taxol.  She had increased fatigue this week, but otherwise tolerated her treatments well. She has no neurologic complaints.  She denies any recent fevers or illnesses. She has a good appetite and denies weight loss.  She has no chest pain, shortness of breath, cough, or hemoptysis.  She denies any nausea, vomiting, constipation, or diarrhea.  She has no urinary complaints.  Patient offers no specific complaints today.  REVIEW OF SYSTEMS:   Review of Systems  Constitutional: Negative.  Negative for fever, malaise/fatigue and weight loss.  Respiratory: Negative.  Negative for cough, hemoptysis and shortness of breath.   Cardiovascular: Negative.  Negative for chest pain and leg swelling.  Gastrointestinal: Negative.  Negative for abdominal pain, diarrhea and nausea.  Genitourinary: Negative.  Negative for dysuria.  Musculoskeletal: Negative.  Negative for back pain and myalgias.  Skin: Negative.  Negative for rash.  Neurological: Negative.  Negative for focal weakness, weakness and headaches.  Psychiatric/Behavioral: Negative.  The patient is not nervous/anxious.     As per HPI. Otherwise, a complete review of systems is negative.  PAST MEDICAL HISTORY: Past Medical History:  Diagnosis Date  . Allergy   . Breast cancer, right ([place]) 2020   Currently taking hormonal chemo tx's.   . Diabetes mellitus without complication ([place]) 7564  . Hyperlipidemia   . Hypertension   . Osteoporosis   . Trigeminal neuralgia      PAST SURGICAL HISTORY: Past Surgical History:  Procedure Laterality Date  . ABDOMINAL HYSTERECTOMY  1978  . BREAST BIOPSY Right 01/06/2019   [place] bx x 2.  Ribbon marker for mass and hydro for Lymph node. Path pending  . BREAST CYST EXCISION Right 1986  . BREAST EXCISIONAL BIOPSY    . IR IMAGING GUIDED PORT INSERTION  05/12/2019  . LAMINECTOMY  1995   L1, L2, L3  . SPINE SURGERY      FAMILY HISTORY: Family History  Adopted: Yes  Family history unknown: Yes    ADVANCED DIRECTIVES (Y/N):  N  HEALTH MAINTENANCE: Social History   Tobacco Use  . Smoking status: Former Smoker    Quit date: 03/16/2000    Years since quitting: 19.3  . Smokeless tobacco: Never Used  Substance Use Topics  . Alcohol use: No  . Drug use: No     Colonoscopy:  PAP:  Bone density:  Lipid panel:  Allergies  Allergen Reactions  . Morphine     Anaphallaxis.    Current Outpatient Medications  Medication Sig Dispense Refill  . ALPRAZolam (XANAX) 1 MG tablet TAKE ONE TABLET BY MOUTH AT BEDTIME AS NEEDED FOR ANXIETY 90 tablet 1  . amLODipine (NORVASC) 5 MG tablet Take 1 tablet (5 mg total) by mouth daily. 90 tablet 3  . amoxicillin-clavulanate (AUGMENTIN XR) 1000-62.5 MG 12 hr tablet Take 2 tablets by mouth 2 (two) times daily.    . baclofen (LIORESAL) 10 MG tablet Take 1 tablet (10 mg total) by mouth daily. 90 tablet 3  . bisoprolol-hydrochlorothiazide (ZIAC) 10-6.25 MG tablet Take 1 tablet by mouth 2 (two) times daily.  180 tablet 3  . buPROPion (WELLBUTRIN XL) 150 MG 24 hr tablet Take 1 tablet (150 mg total) by mouth 2 (two) times a day. 180 tablet 1  . Cholecalciferol (VITAMIN D3) 5000 units CAPS Take by mouth.    . letrozole (FEMARA) 2.5 MG tablet Take 1 tablet (2.5 mg total) by mouth daily. 90 tablet 3  . lidocaine-prilocaine (EMLA) cream Apply to affected area once 30 g 3  . lisinopril-hydrochlorothiazide (ZESTORETIC) 10-12.5 MG tablet Take 1 tablet by mouth daily. 90 tablet 3  . loratadine  (CLARITIN) 10 MG tablet Take 1 tablet (10 mg total) by mouth daily as needed. 90 tablet 3  . meloxicam (MOBIC) 15 MG tablet Take 1 tablet (15 mg total) by mouth daily. 90 tablet 3  . metFORMIN (GLUCOPHAGE) 1000 MG tablet Take 1 tablet (1,000 mg total) by mouth 2 (two) times daily with a meal. 180 tablet 3  . MULTIPLE VITAMINS PO MULTIVITAMINS (Oral Tablet)  1 Every Day for 0 days  Quantity: 0.00;  Refills: 0   Ordered :17-Apr-2011  Nichole Martinez ;  Started 24-February-2009 Active    . OMEGA 3-6-9 FATTY ACIDS PO Reported on 10/04/2015    . ondansetron (ZOFRAN) 8 MG tablet Take 1 tablet (8 mg total) by mouth 2 (two) times daily as needed. 30 tablet 2  . prochlorperazine (COMPAZINE) 10 MG tablet Take 1 tablet (10 mg total) by mouth every 6 (six) hours as needed (Nausea or vomiting). 60 tablet 2  . rizatriptan (MAXALT) 5 MG tablet TAKE ONE TABLET BY MOUTH AS NEEDED FOR MIGRAINE. MAY REPEAT IN 2 HOURS IF NEEDED. 10 tablet 11  . simvastatin (ZOCOR) 20 MG tablet Take 1 tablet (20 mg total) by mouth at bedtime. 90 tablet 3  . Vitamin D, Ergocalciferol, (DRISDOL) 1.25 MG (50000 UT) CAPS capsule Take 1 capsule (50,000 Units total) by mouth every 7 (seven) days. 12 capsule 4  . vitamin E 1000 UNIT capsule Take 1,000 Units by mouth daily.     No current facility-administered medications for this visit.     OBJECTIVE: Vitals:   07/15/19 0853  BP: 97/67  Pulse: 68  Resp: 18  Temp: (!) 96.9 F (36.1 C)  SpO2: 99%     Body mass index is 33.02 kg/m.    ECOG FS:0 - Asymptomatic  General: Well-developed, well-nourished, no acute distress. Eyes: Pink conjunctiva, anicteric sclera. HEENT: Normocephalic, moist mucous membranes. Lungs: Clear to auscultation bilaterally. Heart: Regular rate and rhythm. No rubs, murmurs, or gallops. Abdomen: Soft, nontender, nondistended. No organomegaly noted, normoactive bowel sounds. Musculoskeletal: No edema, cyanosis, or clubbing. Neuro: Alert, answering all questions  appropriately. Cranial nerves grossly intact. Skin: No rashes or petechiae noted. Psych: Normal affect.  LAB RESULTS:  Lab Results  Component Value Date   NA 141 07/15/2019   K 3.4 (L) 07/15/2019   CL 109 07/15/2019   CO2 21 (L) 07/15/2019   GLUCOSE 139 (H) 07/15/2019   BUN 18 07/15/2019   CREATININE 0.89 07/15/2019   CALCIUM 9.1 07/15/2019   PROT 6.4 (L) 07/15/2019   ALBUMIN 3.8 07/15/2019   AST 20 07/15/2019   ALT 14 07/15/2019   ALKPHOS 48 07/15/2019   BILITOT 0.4 07/15/2019   GFRNONAA >60 07/15/2019   GFRAA >60 07/15/2019    Lab Results  Component Value Date   WBC 3.9 (L) 07/15/2019   NEUTROABS 2.6 07/15/2019   HGB 8.8 (L) 07/15/2019   HCT 27.3 (L) 07/15/2019   MCV 82.5 07/15/2019   PLT 249  07/15/2019     STUDIES: No results found.  ASSESSMENT: Clinical stage IIa ER/PR positive, HER-2 negative invasive carcinoma of the central portion of the right breast.  Oncotype DX score 7.  PLAN:   1. Clinical stage IIa ER/PR positive, HER-2 negative invasive carcinoma of the central portion of the right breast: Although patient is clinical stage IIa and typically neoadjuvant chemotherapy is the recommendation, patient did not wish to pursue aggressive immunosuppressive treatment during the COVID-19 pandemic.  Because of this, patient initiated neoadjuvant letrozole in approximately April 2020.  Patient is now more comfortable initiating treatment and has agreed to neoadjuvant chemotherapy using Adriamycin, Cytoxan with Udenyca support followed by weekly Taxol x12.  Recent MRI results noted.  Repeat MUGA on June 09, 2019 revealed an ejection fraction of 52.2% which is unchanged from prior.  Proceed with cycle 2 of 12 of weekly Taxol today.  Return to clinic in 1 week for further evaluation and consideration of cycle 3. 2.  COVID positivity: Resolved.  Patient reports repeat testing is negative.  She is no longer under quarantine.  It is likely that the groundglass densities  seen on her CT scan from March 17, 2019 are residual from her infection.  No intervention is needed at this time. 3.  Nausea and diarrhea: Resolved.   4.  Thyroid nodule: Biopsy reported as benign.  Likely multinodular goiter. 5.  Left breast nodule: 7 mm lesion noted on MRI, monitor. 6.  Anemia: Hemoglobin continues to trend down is now 8.8.  Monitor. 7.  Leukopenia: Mild, monitor.   Patient expressed understanding and was in agreement with this plan. She also understands that She can call clinic at any time with any questions, concerns, or complaints.   Cancer Staging Cancer of central portion of right female breast [facility]) Staging form: Breast, AJCC 8th Edition - Clinical stage from 01/13/2019: Stage IIA (cT2, cN1, cM0, G1, ER+, PR+, HER2-) - Signed by Lloyd Huger, MD on 01/13/2019   Lloyd Huger, MD   07/16/2019 6:15 AM

## 2019-07-14 ENCOUNTER — Other Ambulatory Visit: Payer: Self-pay

## 2019-07-14 NOTE — Progress Notes (Signed)
Patient pre screened for office appointment, no questions or concerns today. 

## 2019-07-15 ENCOUNTER — Other Ambulatory Visit: Payer: Self-pay

## 2019-07-15 ENCOUNTER — Inpatient Hospital Stay: Payer: Medicare Other

## 2019-07-15 ENCOUNTER — Inpatient Hospital Stay (HOSPITAL_BASED_OUTPATIENT_CLINIC_OR_DEPARTMENT_OTHER): Payer: Medicare Other | Admitting: Oncology

## 2019-07-15 VITALS — BP 97/67 | HR 68 | Temp 96.9°F | Resp 18 | Wt 210.8 lb

## 2019-07-15 DIAGNOSIS — C50111 Malignant neoplasm of central portion of right female breast: Secondary | ICD-10-CM

## 2019-07-15 DIAGNOSIS — Z170 Estrogen receptor positive status [ER+]: Secondary | ICD-10-CM

## 2019-07-15 DIAGNOSIS — Z5111 Encounter for antineoplastic chemotherapy: Secondary | ICD-10-CM | POA: Diagnosis not present

## 2019-07-15 DIAGNOSIS — Z5189 Encounter for other specified aftercare: Secondary | ICD-10-CM | POA: Diagnosis not present

## 2019-07-15 LAB — COMPREHENSIVE METABOLIC PANEL
ALT: 14 U/L (ref 0–44)
AST: 20 U/L (ref 15–41)
Albumin: 3.8 g/dL (ref 3.5–5.0)
Alkaline Phosphatase: 48 U/L (ref 38–126)
Anion gap: 11 (ref 5–15)
BUN: 18 mg/dL (ref 8–23)
CO2: 21 mmol/L — ABNORMAL LOW (ref 22–32)
Calcium: 9.1 mg/dL (ref 8.9–10.3)
Chloride: 109 mmol/L (ref 98–111)
Creatinine, Ser: 0.89 mg/dL (ref 0.44–1.00)
GFR calc Af Amer: >60 mL/min (ref >60)
GFR calc non Af Amer: >60 mL/min (ref >60)
Glucose, Bld: 139 mg/dL — ABNORMAL HIGH (ref 70–99)
Potassium: 3.4 mmol/L — ABNORMAL LOW (ref 3.5–5.1)
Sodium: 141 mmol/L (ref 135–145)
Total Bilirubin: 0.4 mg/dL (ref 0.3–1.2)
Total Protein: 6.4 g/dL — ABNORMAL LOW (ref 6.5–8.1)

## 2019-07-15 LAB — CBC WITH DIFFERENTIAL/PLATELET
Abs Immature Granulocytes: 0.06 10*3/uL (ref 0.00–0.07)
Basophils Absolute: 0.1 10*3/uL (ref 0.0–0.1)
Basophils Relative: 2 %
Eosinophils Absolute: 0.1 10*3/uL (ref 0.0–0.5)
Eosinophils Relative: 3 %
HCT: 27.3 % — ABNORMAL LOW (ref 36.0–46.0)
Hemoglobin: 8.8 g/dL — ABNORMAL LOW (ref 12.0–15.0)
Immature Granulocytes: 2 %
Lymphocytes Relative: 20 %
Lymphs Abs: 0.8 10*3/uL (ref 0.7–4.0)
MCH: 26.6 pg (ref 26.0–34.0)
MCHC: 32.2 g/dL (ref 30.0–36.0)
MCV: 82.5 fL (ref 80.0–100.0)
Monocytes Absolute: 0.3 10*3/uL (ref 0.1–1.0)
Monocytes Relative: 8 %
Neutro Abs: 2.6 10*3/uL (ref 1.7–7.7)
Neutrophils Relative %: 65 %
Platelets: 249 10*3/uL (ref 150–400)
RBC: 3.31 MIL/uL — ABNORMAL LOW (ref 3.87–5.11)
RDW: 17.2 % — ABNORMAL HIGH (ref 11.5–15.5)
WBC: 3.9 10*3/uL — ABNORMAL LOW (ref 4.0–10.5)
nRBC: 0 % (ref 0.0–0.2)

## 2019-07-15 MED ORDER — HEPARIN SOD (PORK) LOCK FLUSH 100 UNIT/ML IV SOLN: 500.0000 [IU] | Freq: Once | INTRAVENOUS | Status: DC | PRN

## 2019-07-15 MED ORDER — SODIUM CHLORIDE 0.9 % IV SOLN: 10.0000 mg | Freq: Once | INTRAVENOUS | Status: DC

## 2019-07-15 MED ORDER — DEXAMETHASONE SODIUM PHOSPHATE 10 MG/ML IJ SOLN
10.0000 mg | Freq: Once | INTRAMUSCULAR | Status: AC
  Administered 2019-07-15: 10 mg via INTRAVENOUS
  Filled 2019-07-15: qty 1

## 2019-07-15 MED ORDER — SODIUM CHLORIDE 0.9% FLUSH
10.0000 mL | INTRAVENOUS | Status: DC | PRN
  Administered 2019-07-15: 10 mL via INTRAVENOUS
  Filled 2019-07-15: qty 10

## 2019-07-15 MED ORDER — FAMOTIDINE IN NACL 20-0.9 MG/50ML-% IV SOLN
20.0000 mg | Freq: Once | INTRAVENOUS | Status: AC
  Administered 2019-07-15: 20 mg via INTRAVENOUS
  Filled 2019-07-15: qty 50

## 2019-07-15 MED ORDER — SODIUM CHLORIDE 0.9 % IV SOLN
Freq: Once | INTRAVENOUS | Status: AC
  Administered 2019-07-15: 10:00:00 via INTRAVENOUS
  Filled 2019-07-15: qty 250

## 2019-07-15 MED ORDER — SODIUM CHLORIDE 0.9 % IV SOLN
80.0000 mg/m2 | Freq: Once | INTRAVENOUS | Status: AC
  Administered 2019-07-15: 174 mg via INTRAVENOUS
  Filled 2019-07-15: qty 29

## 2019-07-15 MED ORDER — DIPHENHYDRAMINE HCL 50 MG/ML IJ SOLN
25.0000 mg | Freq: Once | INTRAMUSCULAR | Status: AC
  Administered 2019-07-15: 10:00:00 25 mg via INTRAVENOUS
  Filled 2019-07-15: qty 1

## 2019-07-15 MED ORDER — HEPARIN SOD (PORK) LOCK FLUSH 100 UNIT/ML IV SOLN
500.0000 [IU] | Freq: Once | INTRAVENOUS | Status: AC
  Administered 2019-07-15: 500 [IU] via INTRAVENOUS
  Filled 2019-07-15: qty 5

## 2019-07-18 NOTE — Progress Notes (Signed)
[place] [facility]  Telephone:(336) [phone] [phone]) [phone]  ID: Nichole Martinez OB: Apr 27, 1951  MR#: [id number]  VZC#:[id number]  Patient Care Team: [name], PA-C as PCP - General (Family Medicine)  CHIEF COMPLAINT: Clinical stage IIa ER/PR positive, HER-2 negative invasive carcinoma of the central portion of the right breast.  INTERVAL HISTORY: Patient returns to clinic today for further evaluation and consideration of cycle 3 of 12 of weekly Taxol.  She is tolerating her treatments well without significant side effects.  She has chronic weakness and fatigue, but this does not affect her day-to-day activities. She has no neurologic complaints.  She denies any recent fevers or illnesses. She has a good appetite and denies weight loss.  She has no chest pain, shortness of breath, cough, or hemoptysis.  She denies any nausea, vomiting, constipation, or diarrhea.  She has no urinary complaints.  Patient offers no specific complaints today.  REVIEW OF SYSTEMS:   Review of Systems  Constitutional: Negative.  Negative for fever, malaise/fatigue and weight loss.  Respiratory: Negative.  Negative for cough, hemoptysis and shortness of breath.   Cardiovascular: Negative.  Negative for chest pain and leg swelling.  Gastrointestinal: Negative.  Negative for abdominal pain, diarrhea and nausea.  Genitourinary: Negative.  Negative for dysuria.  Musculoskeletal: Negative.  Negative for back pain and myalgias.  Skin: Negative.  Negative for rash.  Neurological: Negative.  Negative for focal weakness, weakness and headaches.  Psychiatric/Behavioral: Negative.  The patient is not nervous/anxious.     As per HPI. Otherwise, a complete review of systems is negative.  PAST MEDICAL HISTORY: Past Medical History:  Diagnosis Date  . Allergy   . Breast cancer, right ([place]) 2020   Currently taking hormonal chemo tx's.   . Diabetes mellitus without complication ([place]) 1287  .  Hyperlipidemia   . Hypertension   . Osteoporosis   . Trigeminal neuralgia     PAST SURGICAL HISTORY: Past Surgical History:  Procedure Laterality Date  . ABDOMINAL HYSTERECTOMY  1978  . BREAST BIOPSY Right 01/06/2019   [place] bx x 2.  Ribbon marker for mass and hydro for Lymph node. Path pending  . BREAST CYST EXCISION Right 1986  . BREAST EXCISIONAL BIOPSY    . IR IMAGING GUIDED PORT INSERTION  05/12/2019  . LAMINECTOMY  1995   L1, L2, L3  . SPINE SURGERY      FAMILY HISTORY: Family History  Adopted: Yes  Family history unknown: Yes    ADVANCED DIRECTIVES (Y/N):  N  HEALTH MAINTENANCE: Social History   Tobacco Use  . Smoking status: Former Smoker    Quit date: 03/16/2000    Years since quitting: 19.3  . Smokeless tobacco: Never Used  Substance Use Topics  . Alcohol use: No  . Drug use: No     Colonoscopy:  PAP:  Bone density:  Lipid panel:  Allergies  Allergen Reactions  . Morphine     Anaphallaxis.    Current Outpatient Medications  Medication Sig Dispense Refill  . ALPRAZolam (XANAX) 1 MG tablet TAKE ONE TABLET BY MOUTH AT BEDTIME AS NEEDED FOR ANXIETY 90 tablet 1  . amLODipine (NORVASC) 5 MG tablet Take 1 tablet (5 mg total) by mouth daily. 90 tablet 3  . amoxicillin-clavulanate (AUGMENTIN XR) 1000-62.5 MG 12 hr tablet Take 2 tablets by mouth 2 (two) times daily.    . baclofen (LIORESAL) 10 MG tablet Take 1 tablet (10 mg total) by mouth daily. 90 tablet 3  . bisoprolol-hydrochlorothiazide (  ZIAC) 10-6.25 MG tablet Take 1 tablet by mouth 2 (two) times daily. 180 tablet 3  . buPROPion (WELLBUTRIN XL) 150 MG 24 hr tablet Take 1 tablet (150 mg total) by mouth 2 (two) times a day. 180 tablet 1  . Cholecalciferol (VITAMIN D3) 5000 units CAPS Take by mouth.    . letrozole (FEMARA) 2.5 MG tablet Take 1 tablet (2.5 mg total) by mouth daily. 90 tablet 3  . lidocaine-prilocaine (EMLA) cream Apply to affected area once 30 g 3  . lisinopril-hydrochlorothiazide  (ZESTORETIC) 10-12.5 MG tablet Take 1 tablet by mouth daily. 90 tablet 3  . loratadine (CLARITIN) 10 MG tablet Take 1 tablet (10 mg total) by mouth daily as needed. 90 tablet 3  . meloxicam (MOBIC) 15 MG tablet Take 1 tablet (15 mg total) by mouth daily. 90 tablet 3  . metFORMIN (GLUCOPHAGE) 1000 MG tablet Take 1 tablet (1,000 mg total) by mouth 2 (two) times daily with a meal. 180 tablet 3  . MULTIPLE VITAMINS PO MULTIVITAMINS (Oral Tablet)  1 Every Day for 0 days  Quantity: 0.00;  Refills: 0   Ordered :17-Apr-2011  [name] ;  Started 24-February-2009 Active    . OMEGA 3-6-9 FATTY ACIDS PO Reported on 10/04/2015    . ondansetron (ZOFRAN) 8 MG tablet Take 1 tablet (8 mg total) by mouth 2 (two) times daily as needed. 30 tablet 2  . prochlorperazine (COMPAZINE) 10 MG tablet Take 1 tablet (10 mg total) by mouth every 6 (six) hours as needed (Nausea or vomiting). 60 tablet 2  . rizatriptan (MAXALT) 5 MG tablet TAKE ONE TABLET BY MOUTH AS NEEDED FOR MIGRAINE. MAY REPEAT IN 2 HOURS IF NEEDED. 10 tablet 11  . simvastatin (ZOCOR) 20 MG tablet Take 1 tablet (20 mg total) by mouth at bedtime. 90 tablet 3  . Vitamin D, Ergocalciferol, (DRISDOL) 1.25 MG (50000 UT) CAPS capsule Take 1 capsule (50,000 Units total) by mouth every 7 (seven) days. 12 capsule 4  . vitamin E 1000 UNIT capsule Take 1,000 Units by mouth daily.     No current facility-administered medications for this visit.     OBJECTIVE: Vitals:   07/22/19 0832  BP: 98/64  Pulse: (!) 58  Resp: 16  Temp: (!) 96 F (35.6 C)  SpO2: 99%     Body mass index is 33.02 kg/m.    ECOG FS:0 - Asymptomatic  General: Well-developed, well-nourished, no acute distress. Eyes: Pink conjunctiva, anicteric sclera. HEENT: Normocephalic, moist mucous membranes. Lungs: Clear to auscultation bilaterally. Heart: Regular rate and rhythm. No rubs, murmurs, or gallops. Abdomen: Soft, nontender, nondistended. No organomegaly noted, normoactive bowel sounds.  Musculoskeletal: No edema, cyanosis, or clubbing. Neuro: Alert, answering all questions appropriately. Cranial nerves grossly intact. Skin: No rashes or petechiae noted. Psych: Normal affect.  LAB RESULTS:  Lab Results  Component Value Date   NA 137 07/22/2019   K 4.2 07/22/2019   CL 106 07/22/2019   CO2 23 07/22/2019   GLUCOSE 130 (H) 07/22/2019   BUN 22 07/22/2019   CREATININE 0.79 07/22/2019   CALCIUM 9.2 07/22/2019   PROT 6.3 (L) 07/22/2019   ALBUMIN 3.9 07/22/2019   AST 18 07/22/2019   ALT 15 07/22/2019   ALKPHOS 47 07/22/2019   BILITOT 0.6 07/22/2019   GFRNONAA >60 07/22/2019   GFRAA >60 07/22/2019    Lab Results  Component Value Date   WBC 3.8 (L) 07/22/2019   NEUTROABS 2.0 07/22/2019   HGB 9.0 (L) 07/22/2019   HCT  28.2 (L) 07/22/2019   MCV 85.2 07/22/2019   PLT 203 07/22/2019     STUDIES: No results found.  ASSESSMENT: Clinical stage IIa ER/PR positive, HER-2 negative invasive carcinoma of the central portion of the right breast.  Oncotype DX score 7.  PLAN:   1. Clinical stage IIa ER/PR positive, HER-2 negative invasive carcinoma of the central portion of the right breast: Although patient is clinical stage IIa and typically neoadjuvant chemotherapy is the recommendation, patient did not wish to pursue aggressive immunosuppressive treatment during the COVID-19 pandemic.  Because of this, patient initiated neoadjuvant letrozole in approximately April 2020.  Patient is now more comfortable initiating treatment and has agreed to neoadjuvant chemotherapy using Adriamycin, Cytoxan with Udenyca support followed by weekly Taxol x12.  Recent MRI results noted.  Repeat MUGA on June 09, 2019 revealed an ejection fraction of 52.2% which is unchanged from prior.  Proceed with cycle 3 of 12 of weekly Taxol today.  Return to clinic in 1 week for further evaluation and consideration of cycle 4. 2.  COVID positivity: Resolved.  Patient reports repeat testing is negative.  It is likely that the groundglass densities seen on her CT scan from March 17, 2019 are residual from her infection.  No intervention is needed at this time. 3.  Nausea and diarrhea: Resolved.   4.  Thyroid nodule: Biopsy reported as benign.  Likely multinodular goiter. 5.  Left breast nodule: 7 mm lesion noted on MRI, monitor. 6.  Anemia: Chronic and unchanged, monitor. 7.  Leukopenia: Chronic and unchanged, monitor.   Patient expressed understanding and was in agreement with this plan. She also understands that She can call clinic at any time with any questions, concerns, or complaints.   Cancer Staging Cancer of central portion of right female breast [facility]) Staging form: Breast, AJCC 8th Edition - Clinical stage from 01/13/2019: Stage IIA (cT2, cN1, cM0, G1, ER+, PR+, HER2-) - Signed by Lloyd Huger, MD on 01/13/2019   Lloyd Huger, MD   07/22/2019 9:11 AM

## 2019-07-21 ENCOUNTER — Encounter: Payer: Self-pay | Admitting: Oncology

## 2019-07-21 ENCOUNTER — Other Ambulatory Visit: Payer: Self-pay

## 2019-07-21 NOTE — Progress Notes (Signed)
Patient prescreened for appointment. Patient has no concerns or questions.  

## 2019-07-22 ENCOUNTER — Inpatient Hospital Stay (HOSPITAL_BASED_OUTPATIENT_CLINIC_OR_DEPARTMENT_OTHER): Payer: Medicare Other | Admitting: Oncology

## 2019-07-22 ENCOUNTER — Inpatient Hospital Stay: Payer: Medicare Other

## 2019-07-22 ENCOUNTER — Other Ambulatory Visit: Payer: Self-pay

## 2019-07-22 ENCOUNTER — Inpatient Hospital Stay: Payer: Medicare Other | Attending: Oncology

## 2019-07-22 VITALS — BP 98/64 | HR 58 | Temp 96.0°F | Resp 16 | Wt 210.8 lb

## 2019-07-22 DIAGNOSIS — E785 Hyperlipidemia, unspecified: Secondary | ICD-10-CM | POA: Insufficient documentation

## 2019-07-22 DIAGNOSIS — C50111 Malignant neoplasm of central portion of right female breast: Secondary | ICD-10-CM

## 2019-07-22 DIAGNOSIS — Z170 Estrogen receptor positive status [ER+]: Secondary | ICD-10-CM

## 2019-07-22 DIAGNOSIS — Z5111 Encounter for antineoplastic chemotherapy: Secondary | ICD-10-CM | POA: Diagnosis not present

## 2019-07-22 DIAGNOSIS — C50011 Malignant neoplasm of nipple and areola, right female breast: Secondary | ICD-10-CM | POA: Diagnosis present

## 2019-07-22 LAB — COMPREHENSIVE METABOLIC PANEL
ALT: 15 U/L (ref 0–44)
AST: 18 U/L (ref 15–41)
Albumin: 3.9 g/dL (ref 3.5–5.0)
Alkaline Phosphatase: 47 U/L (ref 38–126)
Anion gap: 8 (ref 5–15)
BUN: 22 mg/dL (ref 8–23)
CO2: 23 mmol/L (ref 22–32)
Calcium: 9.2 mg/dL (ref 8.9–10.3)
Chloride: 106 mmol/L (ref 98–111)
Creatinine, Ser: 0.79 mg/dL (ref 0.44–1.00)
GFR calc Af Amer: >60 mL/min (ref >60)
GFR calc non Af Amer: >60 mL/min (ref >60)
Glucose, Bld: 130 mg/dL — ABNORMAL HIGH (ref 70–99)
Potassium: 4.2 mmol/L (ref 3.5–5.1)
Sodium: 137 mmol/L (ref 135–145)
Total Bilirubin: 0.6 mg/dL (ref 0.3–1.2)
Total Protein: 6.3 g/dL — ABNORMAL LOW (ref 6.5–8.1)

## 2019-07-22 LAB — CBC WITH DIFFERENTIAL/PLATELET
Abs Immature Granulocytes: 0.02 10*3/uL (ref 0.00–0.07)
Basophils Absolute: 0.1 10*3/uL (ref 0.0–0.1)
Basophils Relative: 2 %
Eosinophils Absolute: 0.7 10*3/uL — ABNORMAL HIGH (ref 0.0–0.5)
Eosinophils Relative: 18 %
HCT: 28.2 % — ABNORMAL LOW (ref 36.0–46.0)
Hemoglobin: 9 g/dL — ABNORMAL LOW (ref 12.0–15.0)
Immature Granulocytes: 1 %
Lymphocytes Relative: 21 %
Lymphs Abs: 0.8 10*3/uL (ref 0.7–4.0)
MCH: 27.2 pg (ref 26.0–34.0)
MCHC: 31.9 g/dL (ref 30.0–36.0)
MCV: 85.2 fL (ref 80.0–100.0)
Monocytes Absolute: 0.3 10*3/uL (ref 0.1–1.0)
Monocytes Relative: 7 %
Neutro Abs: 2 10*3/uL (ref 1.7–7.7)
Neutrophils Relative %: 51 %
Platelets: 203 10*3/uL (ref 150–400)
RBC: 3.31 MIL/uL — ABNORMAL LOW (ref 3.87–5.11)
RDW: 18.3 % — ABNORMAL HIGH (ref 11.5–15.5)
WBC: 3.8 10*3/uL — ABNORMAL LOW (ref 4.0–10.5)
nRBC: 0 % (ref 0.0–0.2)

## 2019-07-22 MED ORDER — DIPHENHYDRAMINE HCL 50 MG/ML IJ SOLN
25.0000 mg | Freq: Once | INTRAMUSCULAR | Status: AC
  Administered 2019-07-22: 25 mg via INTRAVENOUS
  Filled 2019-07-22: qty 1

## 2019-07-22 MED ORDER — SODIUM CHLORIDE 0.9 % IV SOLN
Freq: Once | INTRAVENOUS | Status: AC
  Administered 2019-07-22: 09:00:00 via INTRAVENOUS
  Filled 2019-07-22: qty 250

## 2019-07-22 MED ORDER — DEXAMETHASONE SODIUM PHOSPHATE 10 MG/ML IJ SOLN
10.0000 mg | Freq: Once | INTRAMUSCULAR | Status: AC
  Administered 2019-07-22: 10 mg via INTRAVENOUS
  Filled 2019-07-22: qty 1

## 2019-07-22 MED ORDER — SODIUM CHLORIDE 0.9 % IV SOLN: 10.0000 mg | Freq: Once | INTRAVENOUS | Status: DC

## 2019-07-22 MED ORDER — FAMOTIDINE IN NACL 20-0.9 MG/50ML-% IV SOLN
20.0000 mg | Freq: Once | INTRAVENOUS | Status: AC
  Administered 2019-07-22: 20 mg via INTRAVENOUS
  Filled 2019-07-22: qty 50

## 2019-07-22 MED ORDER — HEPARIN SOD (PORK) LOCK FLUSH 100 UNIT/ML IV SOLN
500.0000 [IU] | Freq: Once | INTRAVENOUS | Status: AC | PRN
  Administered 2019-07-22: 500 [IU]
  Filled 2019-07-22: qty 5

## 2019-07-22 MED ORDER — SODIUM CHLORIDE 0.9 % IV SOLN
80.0000 mg/m2 | Freq: Once | INTRAVENOUS | Status: AC
  Administered 2019-07-22: 174 mg via INTRAVENOUS
  Filled 2019-07-22: qty 29

## 2019-07-23 NOTE — Progress Notes (Signed)
[place]  [phone] [phone]  ID: Nichole Martinez OB: [birth year]  MR#: [id number]  TGG#:[id number]  Patient Care Team: [name], PA-C as PCP - General (Family Medicine)  CHIEF COMPLAINT: Clinical stage IIa ER/PR positive, HER-2 negative invasive carcinoma of the central portion of the right breast.  INTERVAL HISTORY: Patient returns to clinic today for further evaluation and consideration of cycle 4 of weekly Taxol.  She continues to tolerate her treatments well without significant side effects.  She does not complain of weakness or fatigue today.  She has no neurologic complaints.  She denies any recent fevers or illnesses. She has a good appetite and denies weight loss.  She has no chest pain, shortness of breath, cough, or hemoptysis.  She denies any nausea, vomiting, constipation, or diarrhea.  She has no urinary complaints.  Patient offers no specific complaints today.  REVIEW OF SYSTEMS:   Review of Systems  Constitutional: Negative.  Negative for fever, malaise/fatigue and weight loss.  Respiratory: Negative.  Negative for cough, hemoptysis and shortness of breath.   Cardiovascular: Negative.  Negative for chest pain and leg swelling.  Gastrointestinal: Negative.  Negative for abdominal pain, diarrhea and nausea.  Genitourinary: Negative.  Negative for dysuria.  Musculoskeletal: Negative.  Negative for back pain and myalgias.  Skin: Negative.  Negative for rash.  Neurological: Negative.  Negative for focal weakness, weakness and headaches.  Psychiatric/Behavioral: Negative.  The patient is not nervous/anxious.     As per HPI. Otherwise, a complete review of systems is negative.  PAST MEDICAL HISTORY: Past Medical History:  Diagnosis Date  . Allergy   . Breast cancer, right ([name]) 2020   Currently taking hormonal chemo tx's.   . Diabetes mellitus without complication ([place]) 7035  . Hyperlipidemia   . Hypertension   .  Osteoporosis   . Trigeminal neuralgia     PAST SURGICAL HISTORY: Past Surgical History:  Procedure Laterality Date  . ABDOMINAL HYSTERECTOMY  1978  . BREAST BIOPSY Right 01/06/2019   [place] bx x 2.  Ribbon marker for mass and hydro for Lymph node. Path pending  . BREAST CYST EXCISION Right 1986  . BREAST EXCISIONAL BIOPSY    . IR IMAGING GUIDED PORT INSERTION  05/12/2019  . LAMINECTOMY  1995   L1, L2, L3  . SPINE SURGERY      FAMILY HISTORY: Family History  Adopted: Yes  Family history unknown: Yes    ADVANCED DIRECTIVES (Y/N):  N  HEALTH MAINTENANCE: Social History   Tobacco Use  . Smoking status: Former Smoker    Quit date: 03/16/2000    Years since quitting: 19.3  . Smokeless tobacco: Never Used  Substance Use Topics  . Alcohol use: No  . Drug use: No     Colonoscopy:  PAP:  Bone density:  Lipid panel:  Allergies  Allergen Reactions  . Morphine     Anaphallaxis.    Current Outpatient Medications  Medication Sig Dispense Refill  . ALPRAZolam (XANAX) 1 MG tablet TAKE ONE TABLET BY MOUTH AT BEDTIME AS NEEDED FOR ANXIETY 90 tablet 1  . amLODipine (NORVASC) 5 MG tablet Take 1 tablet (5 mg total) by mouth daily. 90 tablet 3  . amoxicillin-clavulanate (AUGMENTIN XR) 1000-62.5 MG 12 hr tablet Take 2 tablets by mouth 2 (two) times daily.    . baclofen (LIORESAL) 10 MG tablet Take 1 tablet (10 mg total) by mouth daily. 90 tablet 3  . bisoprolol-hydrochlorothiazide (ZIAC) 10-6.25 MG tablet Take  1 tablet by mouth 2 (two) times daily. 180 tablet 3  . buPROPion (WELLBUTRIN XL) 150 MG 24 hr tablet Take 1 tablet (150 mg total) by mouth 2 (two) times a day. 180 tablet 1  . Cholecalciferol (VITAMIN D3) 5000 units CAPS Take by mouth.    . letrozole (FEMARA) 2.5 MG tablet Take 1 tablet (2.5 mg total) by mouth daily. 90 tablet 3  . lidocaine-prilocaine (EMLA) cream Apply to affected area once 30 g 3  . lisinopril-hydrochlorothiazide (ZESTORETIC) 10-12.5 MG tablet Take 1 tablet by  mouth daily. 90 tablet 3  . loratadine (CLARITIN) 10 MG tablet Take 1 tablet (10 mg total) by mouth daily as needed. 90 tablet 3  . meloxicam (MOBIC) 15 MG tablet Take 1 tablet (15 mg total) by mouth daily. 90 tablet 3  . metFORMIN (GLUCOPHAGE) 1000 MG tablet Take 1 tablet (1,000 mg total) by mouth 2 (two) times daily with a meal. 180 tablet 3  . MULTIPLE VITAMINS PO MULTIVITAMINS (Oral Tablet)  1 Every Day for 0 days  Quantity: 0.00;  Refills: 0   Ordered :17-Apr-2011  [name] ;  Started 24-February-2009 Active    . OMEGA 3-6-9 FATTY ACIDS PO Reported on 10/04/2015    . ondansetron (ZOFRAN) 8 MG tablet Take 1 tablet (8 mg total) by mouth 2 (two) times daily as needed. 30 tablet 2  . prochlorperazine (COMPAZINE) 10 MG tablet Take 1 tablet (10 mg total) by mouth every 6 (six) hours as needed (Nausea or vomiting). 60 tablet 2  . rizatriptan (MAXALT) 5 MG tablet TAKE ONE TABLET BY MOUTH AS NEEDED FOR MIGRAINE. MAY REPEAT IN 2 HOURS IF NEEDED. 10 tablet 11  . simvastatin (ZOCOR) 20 MG tablet Take 1 tablet (20 mg total) by mouth at bedtime. 90 tablet 3  . Vitamin D, Ergocalciferol, (DRISDOL) 1.25 MG (50000 UT) CAPS capsule Take 1 capsule (50,000 Units total) by mouth every 7 (seven) days. 12 capsule 4  . vitamin E 1000 UNIT capsule Take 1,000 Units by mouth daily.     No current facility-administered medications for this visit.     OBJECTIVE: Vitals:   07/29/19 0934  BP: 106/78  Pulse: (!) 59  Resp: 18  Temp: 97.6 F (36.4 C)  SpO2: 100%     Body mass index is 32.92 kg/m.    ECOG FS:0 - Asymptomatic  General: Well-developed, well-nourished, no acute distress. Eyes: Pink conjunctiva, anicteric sclera. HEENT: Normocephalic, moist mucous membranes. Breast: Exam deferred today. Lungs: Clear to auscultation bilaterally. Heart: Regular rate and rhythm. No rubs, murmurs, or gallops. Abdomen: Soft, nontender, nondistended. No organomegaly noted, normoactive bowel sounds. Musculoskeletal: No  edema, cyanosis, or clubbing. Neuro: Alert, answering all questions appropriately. Cranial nerves grossly intact. Skin: No rashes or petechiae noted. Psych: Normal affect.  LAB RESULTS:  Lab Results  Component Value Date   NA 137 07/29/2019   K 4.2 07/29/2019   CL 104 07/29/2019   CO2 24 07/29/2019   GLUCOSE 141 (H) 07/29/2019   BUN 24 (H) 07/29/2019   CREATININE 0.85 07/29/2019   CALCIUM 9.2 07/29/2019   PROT 6.3 (L) 07/29/2019   ALBUMIN 3.8 07/29/2019   AST 19 07/29/2019   ALT 15 07/29/2019   ALKPHOS 46 07/29/2019   BILITOT 0.6 07/29/2019   GFRNONAA >60 07/29/2019   GFRAA >60 07/29/2019    Lab Results  Component Value Date   WBC 4.2 07/29/2019   NEUTROABS 2.5 07/29/2019   HGB 9.1 (L) 07/29/2019   HCT 28.5 (L)  07/29/2019   MCV 87.4 07/29/2019   PLT 244 07/29/2019     STUDIES: No results found.  ASSESSMENT: Clinical stage IIa ER/PR positive, HER-2 negative invasive carcinoma of the central portion of the right breast.  Oncotype DX score 7.  PLAN:   1. Clinical stage IIa ER/PR positive, HER-2 negative invasive carcinoma of the central portion of the right breast: Although patient is clinical stage IIa and typically neoadjuvant chemotherapy is the recommendation, patient did not wish to pursue aggressive immunosuppressive treatment during the COVID-19 pandemic.  Because of this, patient initiated neoadjuvant letrozole in approximately April 2020.  Patient is now more comfortable initiating treatment and has agreed to neoadjuvant chemotherapy using Adriamycin, Cytoxan with Udenyca support followed by weekly Taxol x12.  Recent MRI results noted.  Repeat MUGA on June 09, 2019 revealed an ejection fraction of 52.2% which is unchanged from prior.  Proceed with cycle 4 of 12 of weekly Taxol today.  Return to clinic in 1 week for further evaluation and consideration of cycle 5.  2.  COVID positivity: Resolved.  Patient reports repeat testing is negative. It is likely that  the groundglass densities seen on her CT scan from March 17, 2019 are residual from her infection.  No intervention is needed at this time. 3.  Nausea and diarrhea: Resolved.   4.  Thyroid nodule: Biopsy reported as benign.  Likely multinodular goiter. 5.  Left breast nodule: 7 mm lesion noted on MRI, monitor. 6.  Anemia: Hemoglobin unchanged at 9.1. 7.  Leukopenia: Resolved.   Patient expressed understanding and was in agreement with this plan. She also understands that She can call clinic at any time with any questions, concerns, or complaints.   Cancer Staging Cancer of central portion of right female breast [facility]) Staging form: Breast, AJCC 8th Edition - Clinical stage from 01/13/2019: Stage IIA (cT2, cN1, cM0, G1, ER+, PR+, HER2-) - Signed by Lloyd Huger, MD on 01/13/2019   Lloyd Huger, MD   07/29/2019 5:49 PM

## 2019-07-28 ENCOUNTER — Encounter: Payer: Self-pay | Admitting: Oncology

## 2019-07-28 ENCOUNTER — Other Ambulatory Visit: Payer: Self-pay

## 2019-07-28 NOTE — Progress Notes (Signed)
Patient pre screened for office appointment, no questions or concerns today. Patient reminded of upcoming appointment time and date. 

## 2019-07-29 ENCOUNTER — Inpatient Hospital Stay: Payer: Medicare Other

## 2019-07-29 ENCOUNTER — Inpatient Hospital Stay (HOSPITAL_BASED_OUTPATIENT_CLINIC_OR_DEPARTMENT_OTHER): Payer: Medicare Other | Admitting: Oncology

## 2019-07-29 ENCOUNTER — Other Ambulatory Visit: Payer: Self-pay

## 2019-07-29 ENCOUNTER — Encounter: Payer: Self-pay | Admitting: Oncology

## 2019-07-29 VITALS — BP 106/78 | HR 59 | Temp 97.6°F | Resp 18 | Wt 210.2 lb

## 2019-07-29 DIAGNOSIS — C50111 Malignant neoplasm of central portion of right female breast: Secondary | ICD-10-CM

## 2019-07-29 DIAGNOSIS — E785 Hyperlipidemia, unspecified: Secondary | ICD-10-CM | POA: Diagnosis not present

## 2019-07-29 DIAGNOSIS — Z5111 Encounter for antineoplastic chemotherapy: Secondary | ICD-10-CM | POA: Diagnosis not present

## 2019-07-29 DIAGNOSIS — Z170 Estrogen receptor positive status [ER+]: Secondary | ICD-10-CM | POA: Diagnosis not present

## 2019-07-29 LAB — COMPREHENSIVE METABOLIC PANEL
ALT: 15 U/L (ref 0–44)
AST: 19 U/L (ref 15–41)
Albumin: 3.8 g/dL (ref 3.5–5.0)
Alkaline Phosphatase: 46 U/L (ref 38–126)
Anion gap: 9 (ref 5–15)
BUN: 24 mg/dL — ABNORMAL HIGH (ref 8–23)
CO2: 24 mmol/L (ref 22–32)
Calcium: 9.2 mg/dL (ref 8.9–10.3)
Chloride: 104 mmol/L (ref 98–111)
Creatinine, Ser: 0.85 mg/dL (ref 0.44–1.00)
GFR calc Af Amer: >60 mL/min (ref >60)
GFR calc non Af Amer: >60 mL/min (ref >60)
Glucose, Bld: 141 mg/dL — ABNORMAL HIGH (ref 70–99)
Potassium: 4.2 mmol/L (ref 3.5–5.1)
Sodium: 137 mmol/L (ref 135–145)
Total Bilirubin: 0.6 mg/dL (ref 0.3–1.2)
Total Protein: 6.3 g/dL — ABNORMAL LOW (ref 6.5–8.1)

## 2019-07-29 LAB — CBC WITH DIFFERENTIAL/PLATELET
Abs Immature Granulocytes: 0.02 10*3/uL (ref 0.00–0.07)
Basophils Absolute: 0.1 10*3/uL (ref 0.0–0.1)
Basophils Relative: 1 %
Eosinophils Absolute: 0.6 10*3/uL — ABNORMAL HIGH (ref 0.0–0.5)
Eosinophils Relative: 14 %
HCT: 28.5 % — ABNORMAL LOW (ref 36.0–46.0)
Hemoglobin: 9.1 g/dL — ABNORMAL LOW (ref 12.0–15.0)
Immature Granulocytes: 1 %
Lymphocytes Relative: 19 %
Lymphs Abs: 0.8 10*3/uL (ref 0.7–4.0)
MCH: 27.9 pg (ref 26.0–34.0)
MCHC: 31.9 g/dL (ref 30.0–36.0)
MCV: 87.4 fL (ref 80.0–100.0)
Monocytes Absolute: 0.3 10*3/uL (ref 0.1–1.0)
Monocytes Relative: 8 %
Neutro Abs: 2.5 10*3/uL (ref 1.7–7.7)
Neutrophils Relative %: 57 %
Platelets: 244 10*3/uL (ref 150–400)
RBC: 3.26 MIL/uL — ABNORMAL LOW (ref 3.87–5.11)
RDW: 19.3 % — ABNORMAL HIGH (ref 11.5–15.5)
WBC: 4.2 10*3/uL (ref 4.0–10.5)
nRBC: 0.5 % — ABNORMAL HIGH (ref 0.0–0.2)

## 2019-07-29 MED ORDER — SODIUM CHLORIDE 0.9 % IV SOLN
80.0000 mg/m2 | Freq: Once | INTRAVENOUS | Status: AC
  Administered 2019-07-29: 174 mg via INTRAVENOUS
  Filled 2019-07-29: qty 29

## 2019-07-29 MED ORDER — SODIUM CHLORIDE 0.9 % IV SOLN
Freq: Once | INTRAVENOUS | Status: AC
  Administered 2019-07-29: 10:00:00 via INTRAVENOUS
  Filled 2019-07-29: qty 250

## 2019-07-29 MED ORDER — FAMOTIDINE IN NACL 20-0.9 MG/50ML-% IV SOLN
20.0000 mg | Freq: Once | INTRAVENOUS | Status: AC
  Administered 2019-07-29: 20 mg via INTRAVENOUS
  Filled 2019-07-29: qty 50

## 2019-07-29 MED ORDER — DEXAMETHASONE SODIUM PHOSPHATE 10 MG/ML IJ SOLN
10.0000 mg | Freq: Once | INTRAMUSCULAR | Status: AC
  Administered 2019-07-29: 10 mg via INTRAVENOUS
  Filled 2019-07-29: qty 1

## 2019-07-29 MED ORDER — HEPARIN SOD (PORK) LOCK FLUSH 100 UNIT/ML IV SOLN
500.0000 [IU] | Freq: Once | INTRAVENOUS | Status: AC | PRN
  Administered 2019-07-29: 500 [IU]
  Filled 2019-07-29: qty 5

## 2019-07-29 MED ORDER — DIPHENHYDRAMINE HCL 50 MG/ML IJ SOLN
25.0000 mg | Freq: Once | INTRAMUSCULAR | Status: AC
  Administered 2019-07-29: 25 mg via INTRAVENOUS
  Filled 2019-07-29: qty 1

## 2019-07-31 NOTE — Progress Notes (Signed)
[place]  Telephone:(336) [phone] Fax:(336) [phone]  ID: Nichole Martinez OB: 29-Nov-1950  MR#: [id number]  UUE#:[id number]  Patient Care Team: [name], PA-C as PCP - General (Family Medicine)  CHIEF COMPLAINT: Clinical stage IIa ER/PR positive, HER-2 negative invasive carcinoma of the central portion of the right breast.  INTERVAL HISTORY: Patient returns to clinic today for further evaluation and consideration of cycle 5 of weekly Taxol.  She continues to tolerate her treatments well without significant side effects. She does not complain of weakness or fatigue today.  She has no neurologic complaints.  She denies any recent fevers or illnesses. She has a good appetite and denies weight loss.  She has no chest pain, shortness of breath, cough, or hemoptysis.  She denies any nausea, vomiting, constipation, or diarrhea.  She has no urinary complaints.  Patient offers no specific complaints today.    REVIEW OF SYSTEMS:   Review of Systems  Constitutional: Negative.  Negative for fever, malaise/fatigue and weight loss.  Respiratory: Negative.  Negative for cough, hemoptysis and shortness of breath.   Cardiovascular: Negative.  Negative for chest pain and leg swelling.  Gastrointestinal: Negative.  Negative for abdominal pain, diarrhea and nausea.  Genitourinary: Negative.  Negative for dysuria.  Musculoskeletal: Negative.  Negative for back pain and myalgias.  Skin: Negative.  Negative for rash.  Neurological: Negative.  Negative for focal weakness, weakness and headaches.  Psychiatric/Behavioral: Negative.  The patient is not nervous/anxious.     As per HPI. Otherwise, a complete review of systems is negative.  PAST MEDICAL HISTORY: Past Medical History:  Diagnosis Date  . Allergy   . Breast cancer, right ([place]) 2020   Currently taking hormonal chemo tx's.   . Diabetes mellitus without complication ([place]) 9150  . Hyperlipidemia   . Hypertension   .  Osteoporosis   . Trigeminal neuralgia     PAST SURGICAL HISTORY: Past Surgical History:  Procedure Laterality Date  . ABDOMINAL HYSTERECTOMY  1978  . BREAST BIOPSY Right 01/06/2019   [place] bx x 2.  Ribbon marker for mass and hydro for Lymph node. Path pending  . BREAST CYST EXCISION Right 1986  . BREAST EXCISIONAL BIOPSY    . IR IMAGING GUIDED PORT INSERTION  05/12/2019  . LAMINECTOMY  1995   L1, L2, L3  . SPINE SURGERY      FAMILY HISTORY: Family History  Adopted: Yes  Family history unknown: Yes    ADVANCED DIRECTIVES (Y/N):  N  HEALTH MAINTENANCE: Social History   Tobacco Use  . Smoking status: Former Smoker    Quit date: 03/16/2000    Years since quitting: 19.4  . Smokeless tobacco: Never Used  Substance Use Topics  . Alcohol use: No  . Drug use: No     Colonoscopy:  PAP:  Bone density:  Lipid panel:  Allergies  Allergen Reactions  . Morphine     Anaphallaxis.    Current Outpatient Medications  Medication Sig Dispense Refill  . ALPRAZolam (XANAX) 1 MG tablet TAKE ONE TABLET BY MOUTH AT BEDTIME AS NEEDED FOR ANXIETY 90 tablet 1  . amLODipine (NORVASC) 5 MG tablet Take 1 tablet (5 mg total) by mouth daily. 90 tablet 3  . baclofen (LIORESAL) 10 MG tablet Take 1 tablet (10 mg total) by mouth daily. 90 tablet 3  . bisoprolol-hydrochlorothiazide (ZIAC) 10-6.25 MG tablet Take 1 tablet by mouth 2 (two) times daily. 180 tablet 3  . buPROPion (WELLBUTRIN XL) 150 MG 24 hr  tablet Take 1 tablet (150 mg total) by mouth 2 (two) times a day. 180 tablet 1  . Cholecalciferol (VITAMIN D3) 5000 units CAPS Take by mouth.    . letrozole (FEMARA) 2.5 MG tablet Take 1 tablet (2.5 mg total) by mouth daily. 90 tablet 3  . lidocaine-prilocaine (EMLA) cream Apply to affected area once 30 g 3  . lisinopril-hydrochlorothiazide (ZESTORETIC) 10-12.5 MG tablet Take 1 tablet by mouth daily. 90 tablet 3  . loratadine (CLARITIN) 10 MG tablet Take 1 tablet (10 mg total) by mouth daily as  needed. 90 tablet 3  . meloxicam (MOBIC) 15 MG tablet Take 1 tablet (15 mg total) by mouth daily. 90 tablet 3  . metFORMIN (GLUCOPHAGE) 1000 MG tablet Take 1 tablet (1,000 mg total) by mouth 2 (two) times daily with a meal. 180 tablet 3  . MULTIPLE VITAMINS PO MULTIVITAMINS (Oral Tablet)  1 Every Day for 0 days  Quantity: 0.00;  Refills: 0   Ordered :17-Apr-2011  [name] ;  Started 24-February-2009 Active    . OMEGA 3-6-9 FATTY ACIDS PO Reported on 10/04/2015    . ondansetron (ZOFRAN) 8 MG tablet Take 1 tablet (8 mg total) by mouth 2 (two) times daily as needed. 30 tablet 2  . prochlorperazine (COMPAZINE) 10 MG tablet Take 1 tablet (10 mg total) by mouth every 6 (six) hours as needed (Nausea or vomiting). 60 tablet 2  . rizatriptan (MAXALT) 5 MG tablet TAKE ONE TABLET BY MOUTH AS NEEDED FOR MIGRAINE. MAY REPEAT IN 2 HOURS IF NEEDED. 10 tablet 11  . simvastatin (ZOCOR) 20 MG tablet Take 1 tablet (20 mg total) by mouth at bedtime. 90 tablet 3  . Vitamin D, Ergocalciferol, (DRISDOL) 1.25 MG (50000 UT) CAPS capsule Take 1 capsule (50,000 Units total) by mouth every 7 (seven) days. 12 capsule 4  . vitamin E 1000 UNIT capsule Take 1,000 Units by mouth daily.     No current facility-administered medications for this visit.     OBJECTIVE: Vitals:   08/05/19 1134  BP: 119/90  Pulse: (!) 58  Resp: 14  Temp: 97.7 F (36.5 C)  SpO2: 100%     Body mass index is 34.38 kg/m.    ECOG FS:0 - Asymptomatic  General: Well-developed, well-nourished, no acute distress. Eyes: Pink conjunctiva, anicteric sclera. HEENT: Normocephalic, moist mucous membranes. Breast: Exam deferred today. Lungs: Clear to auscultation bilaterally. Heart: Regular rate and rhythm. No rubs, murmurs, or gallops. Abdomen: Soft, nontender, nondistended. No organomegaly noted, normoactive bowel sounds. Musculoskeletal: No edema, cyanosis, or clubbing. Neuro: Alert, answering all questions appropriately. Cranial nerves grossly intact.  Skin: No rashes or petechiae noted. Psych: Normal affect.  LAB RESULTS:  Lab Results  Component Value Date   NA 135 08/05/2019   K 4.2 08/05/2019   CL 106 08/05/2019   CO2 22 08/05/2019   GLUCOSE 127 (H) 08/05/2019   BUN 33 (H) 08/05/2019   CREATININE 0.62 08/05/2019   CALCIUM 9.2 08/05/2019   PROT 6.5 08/05/2019   ALBUMIN 4.0 08/05/2019   AST 15 08/05/2019   ALT 15 08/05/2019   ALKPHOS 45 08/05/2019   BILITOT 0.8 08/05/2019   GFRNONAA >60 08/05/2019   GFRAA >60 08/05/2019    Lab Results  Component Value Date   WBC 5.1 08/05/2019   NEUTROABS 2.7 08/05/2019   HGB 9.3 (L) 08/05/2019   HCT 29.5 (L) 08/05/2019   MCV 88.3 08/05/2019   PLT 208 08/05/2019     STUDIES: No results found.  ASSESSMENT:  Clinical stage IIa ER/PR positive, HER-2 negative invasive carcinoma of the central portion of the right breast.  Oncotype DX score 7.  PLAN:   1. Clinical stage IIa ER/PR positive, HER-2 negative invasive carcinoma of the central portion of the right breast: Although patient is clinical stage IIa and typically neoadjuvant chemotherapy is the recommendation, patient initially did not wish to pursue aggressive immunosuppressive treatment during the COVID-19 pandemic.  Instead, patient received neoadjuvant letrozole from April 2020 through August 2020.  She then proceeded with neoadjuvant chemotherapy using Adriamycin, Cytoxan with Udenyca support followed by weekly Taxol x12.  Patient completed Adriamycin and Cytoxan on June 24, 2019.  Recent MRI results noted.  Repeat MUGA on June 09, 2019 revealed an ejection fraction of 52.2% which is unchanged from prior.  Proceed with cycle 5 of 12 of weekly Taxol today.  Patient has requested Thanksgiving week off, therefore will return to clinic in 2 weeks for consideration of cycle 6. 2.  COVID positivity: Resolved.  Patient reports repeat testing is negative. It is likely that the groundglass densities seen on her CT scan from March 17, 2019 are residual from her infection.  No intervention is needed at this time. 3.  Nausea and diarrhea: Resolved.   4.  Thyroid nodule: Biopsy reported as benign.  Likely multinodular goiter. 5.  Left breast nodule: 7 mm lesion noted on MRI, monitor. 6.  Anemia: Hemoglobin slowly trending up at 9.3.   7.  Leukopenia: Resolved.   Patient expressed understanding and was in agreement with this plan. She also understands that She can call clinic at any time with any questions, concerns, or complaints.   Cancer Staging Cancer of central portion of right female breast [facility]) Staging form: Breast, AJCC 8th Edition - Clinical stage from 01/13/2019: Stage IIA (cT2, cN1, cM0, G1, ER+, PR+, HER2-) - Signed by Lloyd Huger, MD on 01/13/2019   Lloyd Huger, MD   08/06/2019 6:53 AM

## 2019-08-04 ENCOUNTER — Encounter: Payer: Self-pay | Admitting: Physician Assistant

## 2019-08-04 ENCOUNTER — Other Ambulatory Visit: Payer: Self-pay

## 2019-08-04 ENCOUNTER — Ambulatory Visit (INDEPENDENT_AMBULATORY_CARE_PROVIDER_SITE_OTHER): Payer: Medicare Other | Admitting: Physician Assistant

## 2019-08-04 VITALS — BP 108/68 | HR 69 | Temp 97.1°F | Resp 16 | Ht 65.0 in | Wt 208.4 lb

## 2019-08-04 DIAGNOSIS — E559 Vitamin D deficiency, unspecified: Secondary | ICD-10-CM

## 2019-08-04 DIAGNOSIS — E119 Type 2 diabetes mellitus without complications: Secondary | ICD-10-CM | POA: Diagnosis not present

## 2019-08-04 DIAGNOSIS — I10 Essential (primary) hypertension: Secondary | ICD-10-CM

## 2019-08-04 DIAGNOSIS — E7800 Pure hypercholesterolemia, unspecified: Secondary | ICD-10-CM

## 2019-08-04 DIAGNOSIS — Z23 Encounter for immunization: Secondary | ICD-10-CM

## 2019-08-04 MED ORDER — INFLUENZA VAC A&B SA ADJ QUAD 0.5 ML IM PRSY: 0.5000 mL | PREFILLED_SYRINGE | Freq: Once | INTRAMUSCULAR | Status: DC

## 2019-08-04 NOTE — Progress Notes (Signed)
Patient: Nichole Martinez, Female    DOB: Jan 24, 1951, 68 y.o.   MRN: [id number] Visit Date: 08/04/2019  Today's Provider: [name], PA-C   Chief Complaint  Patient presents with  . Follow-up   Subjective:    Nichole Martinez is a 68 y.o. female. She feels poorly. She reports exercising none. She reports she is sleeping fairly well.   She was diagnosed with breast cancer in the right breast in April 2020. She is currently in her second round of Chemo, which will last for 10 weeks. She will then most likely have RXT. She is hoping for a mastectomy in the new year (prefers bilateral). She reports nausea and fatigue following her infusions. States that she has infusions on Thursdays, nausea starts by Saturday and last until about Tuesday. Feels she is tolerating it well enough at this time.  -----------------------------------------------------------  Bone Density ordered by Dr.Finnegan, Christia Reading. Mammogram:06/07/2019 done  Review of Systems  Constitutional: Negative.   HENT: Positive for postnasal drip and rhinorrhea.   Eyes: Negative.   Respiratory: Negative.   Cardiovascular: Negative.   Gastrointestinal: Negative.   Endocrine: Negative.   Genitourinary: Negative.   Musculoskeletal: Positive for joint swelling (on bilateral kness).  Skin: Negative.   Allergic/Immunologic: Positive for immunocompromised state.  Neurological: Negative.   Hematological: Negative.   Psychiatric/Behavioral: Negative.     Social History   Socioeconomic History  . Marital status: Divorced    Spouse name: Not on file  . Number of children: 1  . Years of education: Not on file  . Highest education level: Not on file  Occupational History  . Occupation: Programmer, multimedia: [employer]  . Financial resource strain: Not on file  . Food insecurity    Worry: Not on file    Inability: Not on file  . Transportation needs    Medical: Not on file    Non-medical: Not  on file  Tobacco Use  . Smoking status: Former Smoker    Quit date: 03/16/2000    Years since quitting: 19.3  . Smokeless tobacco: Never Used  Substance and Sexual Activity  . Alcohol use: No  . Drug use: No  . Sexual activity: Not on file  Lifestyle  . Physical activity    Days per week: Not on file    Minutes per session: Not on file  . Stress: Not on file  Relationships  . Social Herbalist on phone: Not on file    Gets together: Not on file    Attends religious service: Not on file    Active member of club or organization: Not on file    Attends meetings of clubs or organizations: Not on file    Relationship status: Not on file  . Intimate partner violence    Fear of current or ex partner: Not on file    Emotionally abused: Not on file    Physically abused: Not on file    Forced sexual activity: Not on file  Other Topics Concern  . Not on file  Social History Narrative  . Not on file    Past Medical History:  Diagnosis Date  . Allergy   . Breast cancer, right ([place]) 2020   Currently taking hormonal chemo tx's.   . Diabetes mellitus without complication ([place]) 0000000  . Hyperlipidemia   . Hypertension   . Osteoporosis   . Trigeminal neuralgia  Patient Active Problem List   Diagnosis Date Noted  . Goals of care, counseling/discussion 04/29/2019  . Cancer of central portion of right female breast ([place]) 01/13/2019  . Migraine 10/04/2015  . Hyperlipemia 04/05/2015  . Allergic rhinitis 01/31/2015  . Arthritis of knee, degenerative 01/31/2015  . Cannot sleep 01/31/2015  . Avitaminosis D 01/31/2015  . Adiposity 02/24/2009  . Diabetes mellitus, type 2 ([place]) 03/07/2008  . Adaptation reaction 02/19/2008  . Trigeminal neuralgia 01/16/2007  . Acid reflux 03/09/2003  . BP (high blood pressure) 01/28/2003    Past Surgical History:  Procedure Laterality Date  . ABDOMINAL HYSTERECTOMY  1978  . BREAST BIOPSY Right 01/06/2019   [place] bx x 2.  Ribbon marker  for mass and hydro for Lymph node. Path pending  . BREAST CYST EXCISION Right 1986  . BREAST EXCISIONAL BIOPSY    . IR IMAGING GUIDED PORT INSERTION  05/12/2019  . LAMINECTOMY  1995   L1, L2, L3  . SPINE SURGERY      Her She was adopted. Family history is unknown by patient.   Current Outpatient Medications:  .  ALPRAZolam (XANAX) 1 MG tablet, TAKE ONE TABLET BY MOUTH AT BEDTIME AS NEEDED FOR ANXIETY, Disp: 90 tablet, Rfl: 1 .  amLODipine (NORVASC) 5 MG tablet, Take 1 tablet (5 mg total) by mouth daily., Disp: 90 tablet, Rfl: 3 .  baclofen (LIORESAL) 10 MG tablet, Take 1 tablet (10 mg total) by mouth daily., Disp: 90 tablet, Rfl: 3 .  bisoprolol-hydrochlorothiazide (ZIAC) 10-6.25 MG tablet, Take 1 tablet by mouth 2 (two) times daily., Disp: 180 tablet, Rfl: 3 .  buPROPion (WELLBUTRIN XL) 150 MG 24 hr tablet, Take 1 tablet (150 mg total) by mouth 2 (two) times a day., Disp: 180 tablet, Rfl: 1 .  Cholecalciferol (VITAMIN D3) 5000 units CAPS, Take by mouth., Disp: , Rfl:  .  letrozole (FEMARA) 2.5 MG tablet, Take 1 tablet (2.5 mg total) by mouth daily., Disp: 90 tablet, Rfl: 3 .  lidocaine-prilocaine (EMLA) cream, Apply to affected area once, Disp: 30 g, Rfl: 3 .  lisinopril-hydrochlorothiazide (ZESTORETIC) 10-12.5 MG tablet, Take 1 tablet by mouth daily., Disp: 90 tablet, Rfl: 3 .  loratadine (CLARITIN) 10 MG tablet, Take 1 tablet (10 mg total) by mouth daily as needed., Disp: 90 tablet, Rfl: 3 .  meloxicam (MOBIC) 15 MG tablet, Take 1 tablet (15 mg total) by mouth daily., Disp: 90 tablet, Rfl: 3 .  metFORMIN (GLUCOPHAGE) 1000 MG tablet, Take 1 tablet (1,000 mg total) by mouth 2 (two) times daily with a meal., Disp: 180 tablet, Rfl: 3 .  MULTIPLE VITAMINS PO, MULTIVITAMINS (Oral Tablet)  1 Every Day for 0 days  Quantity: 0.00;  Refills: 0   Ordered :17-Apr-2011  [name] ;  Started 24-February-2009 Active, Disp: , Rfl:  .  OMEGA 3-6-9 FATTY ACIDS PO, Reported on 10/04/2015, Disp: , Rfl:  .   ondansetron (ZOFRAN) 8 MG tablet, Take 1 tablet (8 mg total) by mouth 2 (two) times daily as needed., Disp: 30 tablet, Rfl: 2 .  prochlorperazine (COMPAZINE) 10 MG tablet, Take 1 tablet (10 mg total) by mouth every 6 (six) hours as needed (Nausea or vomiting)., Disp: 60 tablet, Rfl: 2 .  rizatriptan (MAXALT) 5 MG tablet, TAKE ONE TABLET BY MOUTH AS NEEDED FOR MIGRAINE. MAY REPEAT IN 2 HOURS IF NEEDED., Disp: 10 tablet, Rfl: 11 .  simvastatin (ZOCOR) 20 MG tablet, Take 1 tablet (20 mg total) by mouth at bedtime., Disp: 90 tablet,  Rfl: 3 .  Vitamin D, Ergocalciferol, (DRISDOL) 1.25 MG (50000 UT) CAPS capsule, Take 1 capsule (50,000 Units total) by mouth every 7 (seven) days., Disp: 12 capsule, Rfl: 4 .  vitamin E 1000 UNIT capsule, Take 1,000 Units by mouth daily., Disp: , Rfl:  No current facility-administered medications for this visit.   Facility-Administered Medications Ordered in Other Visits:  .  [START ON 08/05/2019] influenza vaccine adjuvanted (FLUAD) injection 0.5 mL, 0.5 mL, Intramuscular, Once, Grayland Ormond, Kathlene November, MD  Patient Care Team: [name], PA-C as PCP - General (Family Medicine)    Objective:    Vitals: BP 108/68 (BP Location: Left Arm, Patient Position: Sitting, Cuff Size: Large)   Pulse 69   Temp (!) 97.1 F (36.2 C) (Other (Comment))   Resp 16   Ht 5\' 5"  (1.651 m)   Wt 208 lb 6.4 oz (94.5 kg)   BMI 34.68 kg/m   Physical Exam Vitals signs reviewed.  Constitutional:      General: She is not in acute distress.    Appearance: Normal appearance. She is well-developed. She is obese. She is not ill-appearing or diaphoretic.  HENT:     Head: Normocephalic and atraumatic.     Right Ear: Tympanic membrane, ear canal and external ear normal.     Left Ear: Tympanic membrane, ear canal and external ear normal.     Nose: Nose normal.     Mouth/Throat:     Mouth: Mucous membranes are moist.     Pharynx: Oropharynx is clear. No oropharyngeal exudate.  Eyes:      General: No scleral icterus.       Right eye: No discharge.        Left eye: No discharge.     Extraocular Movements: Extraocular movements intact.     Conjunctiva/sclera: Conjunctivae normal.     Pupils: Pupils are equal, round, and reactive to light.  Neck:     Musculoskeletal: Normal range of motion and neck supple.     Thyroid: No thyromegaly.     Vascular: No JVD.     Trachea: No tracheal deviation.  Cardiovascular:     Rate and Rhythm: Normal rate and regular rhythm.     Pulses: Normal pulses.     Heart sounds: Normal heart sounds. No murmur. No friction rub. No gallop.   Pulmonary:     Effort: Pulmonary effort is normal. No respiratory distress.     Breath sounds: Normal breath sounds. No wheezing or rales.  Chest:     Chest wall: No tenderness.  Abdominal:     General: Bowel sounds are normal. There is no distension.     Palpations: Abdomen is soft. There is no mass.     Tenderness: There is no abdominal tenderness. There is no guarding or rebound.  Musculoskeletal: Normal range of motion.        General: No tenderness.     Right lower leg: No edema.     Left lower leg: No edema.  Lymphadenopathy:     Cervical: No cervical adenopathy.  Skin:    General: Skin is warm and dry.     Capillary Refill: Capillary refill takes less than 2 seconds.     Findings: No rash.  Neurological:     General: No focal deficit present.     Mental Status: She is alert and oriented to person, place, and time. Mental status is at baseline.  Psychiatric:        Mood and Affect:  Mood normal. Affect is flat.        Behavior: Behavior normal.        Thought Content: Thought content normal.        Judgment: Judgment normal.     Activities of Daily Living In your present state of health, do you have any difficulty performing the following activities: 08/04/2019 05/12/2019  Hearing? N N  Vision? N N  Difficulty concentrating or making decisions? N N  Walking or climbing stairs? N Y   Dressing or bathing? N N  Doing errands, shopping? N -  Some recent data might be hidden    Fall Risk Assessment Fall Risk  04/20/2019 01/12/2019 01/12/2019 05/07/2018  Falls in the past year? 0 0 0 No  Number falls in past yr: - 0 0 -  Injury with Fall? - 0 0 -     Depression Screen PHQ 2/9 Scores 08/04/2019 05/07/2018 05/06/2017  PHQ - 2 Score 0 0 0  PHQ- 9 Score - 0 0    No flowsheet data found.    Assessment & Plan:     Annual Wellness Visit  Reviewed patient's Family Medical History Reviewed and updated list of patient's medical providers Assessment of cognitive impairment was done Assessed patient's functional ability Established a written schedule for health screening [place] Completed and Reviewed  Exercise Activities and Dietary recommendations Goals   None     Immunization History  Administered Date(s) Administered  . Influenza,inj,Quad PF,6+ Mos 06/17/2015    Health Maintenance  Topic Date Due  . COLONOSCOPY  09/04/2003  . OPHTHALMOLOGY EXAM  03/07/2017  . DEXA SCAN  09/03/2018  . INFLUENZA VACCINE  04/17/2019  . TETANUS/TDAP  03/28/2020 (Originally 09/03/1972)  . PNA vac Low Risk Adult (1 of 2 - PCV13) 03/28/2020 (Originally 09/03/2018)  . HEMOGLOBIN A1C  09/29/2019  . FOOT EXAM  03/28/2020  . MAMMOGRAM  12/29/2020  . Hepatitis C Screening  Completed  . HIV Screening  Completed     Discussed health benefits of physical activity, and encouraged her to engage in regular exercise appropriate for her age and condition.    1. Essential hypertension Stable. Continue Amlodipine 5mg , Ziac 10-6.25mg , Lisinopril-HCTZ 10-12.5mg . Gets CMP done weekly with infusions.   2. Type 2 diabetes mellitus without complication, without long-term current use of insulin (HCC) Stable. Last A1c was 6.0 in July 2020. Continue Metformin 1000mg  BID.  3. Avitaminosis D H/O this and now on chemo and letrozole. Continue high dose Vit D supplementation.  Will check labs as below and f/u pending results. - Vitamin D (25 hydroxy)  4. Pure hypercholesterolemia Stable. Continue Simvastatin 20mg . Will check labs as below and f/u pending results. - Lipid Profile  ------------------------------------------------------------------------------------------------------------    [name], PA-C  [facility]

## 2019-08-04 NOTE — Progress Notes (Signed)
Patient pre screened for office appointment, no questions or concerns today. Patient reminded of upcoming appointment time and date. 

## 2019-08-04 NOTE — Patient Instructions (Signed)

## 2019-08-05 ENCOUNTER — Inpatient Hospital Stay: Payer: Medicare Other

## 2019-08-05 ENCOUNTER — Inpatient Hospital Stay (HOSPITAL_BASED_OUTPATIENT_CLINIC_OR_DEPARTMENT_OTHER): Payer: Medicare Other | Admitting: Oncology

## 2019-08-05 ENCOUNTER — Other Ambulatory Visit: Payer: Self-pay

## 2019-08-05 ENCOUNTER — Encounter: Payer: Self-pay | Admitting: Oncology

## 2019-08-05 VITALS — BP 119/90 | HR 58 | Temp 97.7°F | Resp 14 | Wt 206.6 lb

## 2019-08-05 DIAGNOSIS — Z170 Estrogen receptor positive status [ER+]: Secondary | ICD-10-CM

## 2019-08-05 DIAGNOSIS — Z5111 Encounter for antineoplastic chemotherapy: Secondary | ICD-10-CM | POA: Diagnosis not present

## 2019-08-05 DIAGNOSIS — C50111 Malignant neoplasm of central portion of right female breast: Secondary | ICD-10-CM

## 2019-08-05 DIAGNOSIS — E7800 Pure hypercholesterolemia, unspecified: Secondary | ICD-10-CM

## 2019-08-05 DIAGNOSIS — E785 Hyperlipidemia, unspecified: Secondary | ICD-10-CM | POA: Diagnosis not present

## 2019-08-05 DIAGNOSIS — Z95828 Presence of other vascular implants and grafts: Secondary | ICD-10-CM

## 2019-08-05 DIAGNOSIS — E559 Vitamin D deficiency, unspecified: Secondary | ICD-10-CM

## 2019-08-05 LAB — CBC WITH DIFFERENTIAL/PLATELET
Abs Immature Granulocytes: 0.03 10*3/uL (ref 0.00–0.07)
Basophils Absolute: 0.1 10*3/uL (ref 0.0–0.1)
Basophils Relative: 1 %
Eosinophils Absolute: 1.3 10*3/uL — ABNORMAL HIGH (ref 0.0–0.5)
Eosinophils Relative: 26 %
HCT: 29.5 % — ABNORMAL LOW (ref 36.0–46.0)
Hemoglobin: 9.3 g/dL — ABNORMAL LOW (ref 12.0–15.0)
Immature Granulocytes: 1 %
Lymphocytes Relative: 15 %
Lymphs Abs: 0.8 10*3/uL (ref 0.7–4.0)
MCH: 27.8 pg (ref 26.0–34.0)
MCHC: 31.5 g/dL (ref 30.0–36.0)
MCV: 88.3 fL (ref 80.0–100.0)
Monocytes Absolute: 0.3 10*3/uL (ref 0.1–1.0)
Monocytes Relative: 6 %
Neutro Abs: 2.7 10*3/uL (ref 1.7–7.7)
Neutrophils Relative %: 51 %
Platelets: 208 10*3/uL (ref 150–400)
RBC: 3.34 MIL/uL — ABNORMAL LOW (ref 3.87–5.11)
RDW: 18.7 % — ABNORMAL HIGH (ref 11.5–15.5)
WBC: 5.1 10*3/uL (ref 4.0–10.5)
nRBC: 0 % (ref 0.0–0.2)

## 2019-08-05 LAB — LIPID PANEL
Cholesterol: 168 mg/dL (ref 0–200)
HDL: 43 mg/dL (ref >40)
LDL Cholesterol: 94 mg/dL (ref 0–99)
Total CHOL/HDL Ratio: 3.9 RATIO
Triglycerides: 153 mg/dL — ABNORMAL HIGH (ref <150)
VLDL: 31 mg/dL (ref 0–40)

## 2019-08-05 LAB — COMPREHENSIVE METABOLIC PANEL
ALT: 15 U/L (ref 0–44)
AST: 15 U/L (ref 15–41)
Albumin: 4 g/dL (ref 3.5–5.0)
Alkaline Phosphatase: 45 U/L (ref 38–126)
Anion gap: 7 (ref 5–15)
BUN: 33 mg/dL — ABNORMAL HIGH (ref 8–23)
CO2: 22 mmol/L (ref 22–32)
Calcium: 9.2 mg/dL (ref 8.9–10.3)
Chloride: 106 mmol/L (ref 98–111)
Creatinine, Ser: 0.62 mg/dL (ref 0.44–1.00)
GFR calc Af Amer: >60 mL/min (ref >60)
GFR calc non Af Amer: >60 mL/min (ref >60)
Glucose, Bld: 127 mg/dL — ABNORMAL HIGH (ref 70–99)
Potassium: 4.2 mmol/L (ref 3.5–5.1)
Sodium: 135 mmol/L (ref 135–145)
Total Bilirubin: 0.8 mg/dL (ref 0.3–1.2)
Total Protein: 6.5 g/dL (ref 6.5–8.1)

## 2019-08-05 LAB — VITAMIN D 25 HYDROXY (VIT D DEFICIENCY, FRACTURES): Vit D, 25-Hydroxy: 74.36 ng/mL (ref 30–100)

## 2019-08-05 MED ORDER — DEXAMETHASONE SODIUM PHOSPHATE 10 MG/ML IJ SOLN
10.0000 mg | Freq: Once | INTRAMUSCULAR | Status: AC
  Administered 2019-08-05: 10 mg via INTRAVENOUS
  Filled 2019-08-05: qty 1

## 2019-08-05 MED ORDER — DIPHENHYDRAMINE HCL 50 MG/ML IJ SOLN
25.0000 mg | Freq: Once | INTRAMUSCULAR | Status: AC
  Administered 2019-08-05: 25 mg via INTRAVENOUS
  Filled 2019-08-05: qty 1

## 2019-08-05 MED ORDER — FAMOTIDINE IN NACL 20-0.9 MG/50ML-% IV SOLN
20.0000 mg | Freq: Once | INTRAVENOUS | Status: AC
  Administered 2019-08-05: 20 mg via INTRAVENOUS
  Filled 2019-08-05: qty 50

## 2019-08-05 MED ORDER — SODIUM CHLORIDE 0.9 % IV SOLN
Freq: Once | INTRAVENOUS | Status: AC
  Administered 2019-08-05: 12:00:00 via INTRAVENOUS
  Filled 2019-08-05: qty 250

## 2019-08-05 MED ORDER — SODIUM CHLORIDE 0.9 % IV SOLN
80.0000 mg/m2 | Freq: Once | INTRAVENOUS | Status: AC
  Administered 2019-08-05: 174 mg via INTRAVENOUS
  Filled 2019-08-05: qty 29

## 2019-08-05 MED ORDER — HEPARIN SOD (PORK) LOCK FLUSH 100 UNIT/ML IV SOLN
500.0000 [IU] | Freq: Once | INTRAVENOUS | Status: AC | PRN
  Administered 2019-08-05: 500 [IU]
  Filled 2019-08-05: qty 5

## 2019-08-05 MED ORDER — SODIUM CHLORIDE 0.9% FLUSH
10.0000 mL | Freq: Once | INTRAVENOUS | Status: AC
  Administered 2019-08-05: 10 mL via INTRAVENOUS
  Filled 2019-08-05: qty 10

## 2019-08-10 DIAGNOSIS — C50411 Malignant neoplasm of upper-outer quadrant of right female breast: Secondary | ICD-10-CM | POA: Diagnosis not present

## 2019-08-10 DIAGNOSIS — Z170 Estrogen receptor positive status [ER+]: Secondary | ICD-10-CM | POA: Diagnosis not present

## 2019-08-11 NOTE — Progress Notes (Signed)
[place]  [phone] [phone]  ID: Nichole Martinez OB: 05/06/1951  MR#: [id number]  GUY#:[id number]  Patient Care Team: [name], PA-C as PCP - General (Family Medicine)  CHIEF COMPLAINT: Clinical stage IIa ER/PR positive, HER-2 negative invasive carcinoma of the central portion of the right breast.  INTERVAL HISTORY: Patient returns to clinic today for further evaluation and consideration of cycle 6 of 12 of weekly Taxol.  She requested last week off for the Thanksgiving holiday.  She currently feels well and is asymptomatic.  She is tolerating her treatments well without significant side effects. She has no neurologic complaints.  She denies any recent fevers or illnesses. She has a good appetite and denies weight loss.  She has no chest pain, shortness of breath, cough, or hemoptysis. She denies any nausea, vomiting, constipation, or diarrhea.  She has no urinary complaints.  Patient offers no specific complaints today.  REVIEW OF SYSTEMS:   Review of Systems  Constitutional: Negative.  Negative for fever, malaise/fatigue and weight loss.  Respiratory: Negative.  Negative for cough, hemoptysis and shortness of breath.   Cardiovascular: Negative.  Negative for chest pain and leg swelling.  Gastrointestinal: Negative.  Negative for abdominal pain, diarrhea and nausea.  Genitourinary: Negative.  Negative for dysuria.  Musculoskeletal: Negative.  Negative for back pain and myalgias.  Skin: Negative.  Negative for rash.  Neurological: Negative.  Negative for focal weakness, weakness and headaches.  Psychiatric/Behavioral: Negative.  The patient is not nervous/anxious.     As per HPI. Otherwise, a complete review of systems is negative.  PAST MEDICAL HISTORY: Past Medical History:  Diagnosis Date  . Allergy   . Breast cancer, right ([place]) 2020   Currently taking hormonal chemo tx's.   . Diabetes mellitus without complication  ([place]) 5638  . Hyperlipidemia   . Hypertension   . Osteoporosis   . Trigeminal neuralgia     PAST SURGICAL HISTORY: Past Surgical History:  Procedure Laterality Date  . ABDOMINAL HYSTERECTOMY  1978  . BREAST BIOPSY Right 01/06/2019   [place] bx x 2.  Ribbon marker for mass and hydro for Lymph node. Path pending  . BREAST CYST EXCISION Right 1986  . BREAST EXCISIONAL BIOPSY    . IR IMAGING GUIDED PORT INSERTION  05/12/2019  . LAMINECTOMY  1995   L1, L2, L3  . SPINE SURGERY      FAMILY HISTORY: Family History  Adopted: Yes  Family history unknown: Yes    ADVANCED DIRECTIVES (Y/N):  N  HEALTH MAINTENANCE: Social History   Tobacco Use  . Smoking status: Former Smoker    Quit date: 03/16/2000    Years since quitting: 19.4  . Smokeless tobacco: Never Used  Substance Use Topics  . Alcohol use: No  . Drug use: No     Colonoscopy:  PAP:  Bone density:  Lipid panel:  Allergies  Allergen Reactions  . Morphine     Anaphallaxis.    Current Outpatient Medications  Medication Sig Dispense Refill  . ALPRAZolam (XANAX) 1 MG tablet TAKE ONE TABLET BY MOUTH AT BEDTIME AS NEEDED FOR ANXIETY 90 tablet 1  . amLODipine (NORVASC) 5 MG tablet Take 1 tablet (5 mg total) by mouth daily. 90 tablet 3  . baclofen (LIORESAL) 10 MG tablet Take 1 tablet (10 mg total) by mouth daily. 90 tablet 3  . bisoprolol-hydrochlorothiazide (ZIAC) 10-6.25 MG tablet Take 1 tablet by mouth 2 (two) times daily. 180 tablet 3  . buPROPion (  WELLBUTRIN XL) 150 MG 24 hr tablet Take 1 tablet (150 mg total) by mouth 2 (two) times a day. 180 tablet 1  . Cholecalciferol (VITAMIN D3) 5000 units CAPS Take by mouth.    . letrozole (FEMARA) 2.5 MG tablet Take 1 tablet (2.5 mg total) by mouth daily. 90 tablet 3  . lisinopril-hydrochlorothiazide (ZESTORETIC) 10-12.5 MG tablet Take 1 tablet by mouth daily. 90 tablet 3  . loratadine (CLARITIN) 10 MG tablet Take 1 tablet (10 mg total) by mouth daily as needed. 90 tablet 3  .  meloxicam (MOBIC) 15 MG tablet Take 1 tablet (15 mg total) by mouth daily. 90 tablet 3  . metFORMIN (GLUCOPHAGE) 1000 MG tablet Take 1 tablet (1,000 mg total) by mouth 2 (two) times daily with a meal. 180 tablet 3  . MULTIPLE VITAMINS PO MULTIVITAMINS (Oral Tablet)  1 Every Day for 0 days  Quantity: 0.00;  Refills: 0   Ordered :17-Apr-2011  [name] ;  Started 24-February-2009 Active    . lidocaine-prilocaine (EMLA) cream Apply to affected area once (Patient not taking: Reported on 08/19/2019) 30 g 3  . OMEGA 3-6-9 FATTY ACIDS PO Reported on 10/04/2015    . ondansetron (ZOFRAN) 8 MG tablet Take 1 tablet (8 mg total) by mouth 2 (two) times daily as needed. 30 tablet 2  . prochlorperazine (COMPAZINE) 10 MG tablet Take 1 tablet (10 mg total) by mouth every 6 (six) hours as needed (Nausea or vomiting). 60 tablet 2  . rizatriptan (MAXALT) 5 MG tablet TAKE ONE TABLET BY MOUTH AS NEEDED FOR MIGRAINE. MAY REPEAT IN 2 HOURS IF NEEDED. 10 tablet 11  . simvastatin (ZOCOR) 20 MG tablet Take 1 tablet (20 mg total) by mouth at bedtime. 90 tablet 3  . Vitamin D, Ergocalciferol, (DRISDOL) 1.25 MG (50000 UT) CAPS capsule Take 1 capsule (50,000 Units total) by mouth every 7 (seven) days. 12 capsule 4  . vitamin E 1000 UNIT capsule Take 1,000 Units by mouth daily.     No current facility-administered medications for this visit.     OBJECTIVE: Vitals:   08/19/19 0835  BP: (!) 142/74  Pulse: (!) 55  Resp: 18  Temp: (!) 97.3 F (36.3 C)  SpO2: 100%     Body mass index is 34.73 kg/m.    ECOG FS:0 - Asymptomatic  General: Well-developed, well-nourished, no acute distress. Eyes: Pink conjunctiva, anicteric sclera. HEENT: Normocephalic, moist mucous membranes. Breast: Exam deferred today. Lungs: Clear to auscultation bilaterally. Heart: Regular rate and rhythm. No rubs, murmurs, or gallops. Abdomen: Soft, nontender, nondistended. No organomegaly noted, normoactive bowel sounds. Musculoskeletal: No edema,  cyanosis, or clubbing. Neuro: Alert, answering all questions appropriately. Cranial nerves grossly intact. Skin: No rashes or petechiae noted. Psych: Normal affect.  LAB RESULTS:  Lab Results  Component Value Date   NA 140 08/19/2019   K 3.8 08/19/2019   CL 108 08/19/2019   CO2 25 08/19/2019   GLUCOSE 121 (H) 08/19/2019   BUN 18 08/19/2019   CREATININE 0.69 08/19/2019   CALCIUM 9.3 08/19/2019   PROT 6.2 (L) 08/19/2019   ALBUMIN 3.8 08/19/2019   AST 16 08/19/2019   ALT 15 08/19/2019   ALKPHOS 53 08/19/2019   BILITOT 0.6 08/19/2019   GFRNONAA >60 08/19/2019   GFRAA >60 08/19/2019    Lab Results  Component Value Date   WBC 5.1 08/19/2019   NEUTROABS 2.5 08/19/2019   HGB 9.6 (L) 08/19/2019   HCT 31.4 (L) 08/19/2019   MCV 90.2 08/19/2019  PLT 192 08/19/2019     STUDIES: No results found.  ASSESSMENT: Clinical stage IIa ER/PR positive, HER-2 negative invasive carcinoma of the central portion of the right breast.  Oncotype DX score 7.  PLAN:   1. Clinical stage IIa ER/PR positive, HER-2 negative invasive carcinoma of the central portion of the right breast: Although patient is clinical stage IIa and typically neoadjuvant chemotherapy is the recommendation, patient initially did not wish to pursue aggressive immunosuppressive treatment during the COVID-19 pandemic.  Instead, patient received neoadjuvant letrozole from April 2020 through August 2020.  She then proceeded with neoadjuvant chemotherapy using Adriamycin, Cytoxan with Udenyca support followed by weekly Taxol x12.  Patient completed Adriamycin and Cytoxan on June 24, 2019.  Recent MRI results noted.  Repeat MUGA on June 09, 2019 revealed an ejection fraction of 52.2% which is unchanged from prior.  Proceed with cycle 6 of 12 of weekly Taxol today.  Return to clinic in 1 week for further evaluation and consideration of cycle 7.  2.  COVID positivity: Resolved.  Patient reports repeat testing is negative. It is  likely that the groundglass densities seen on her CT scan from March 17, 2019 are residual from her infection.  No intervention is needed at this time. 3.  Nausea and diarrhea: Resolved.   4.  Thyroid nodule: Biopsy reported as benign.  Likely multinodular goiter. 5.  Left breast nodule: 7 mm lesion noted on MRI, monitor. 6.  Anemia: Hemoglobin slightly improved to 9.6.   7.  Leukopenia: Resolved.   Patient expressed understanding and was in agreement with this plan. She also understands that She can call clinic at any time with any questions, concerns, or complaints.   Cancer Staging Cancer of central portion of right female breast [facility]) Staging form: Breast, AJCC 8th Edition - Clinical stage from 01/13/2019: Stage IIA (cT2, cN1, cM0, G1, ER+, PR+, HER2-) - Signed by Lloyd Huger, MD on 01/13/2019   Lloyd Huger, MD   08/20/2019 9:00 AM

## 2019-08-19 ENCOUNTER — Other Ambulatory Visit: Payer: Medicare Other

## 2019-08-19 ENCOUNTER — Inpatient Hospital Stay: Payer: Medicare Other | Attending: Oncology

## 2019-08-19 ENCOUNTER — Ambulatory Visit: Payer: Medicare Other | Admitting: Oncology

## 2019-08-19 ENCOUNTER — Inpatient Hospital Stay (HOSPITAL_BASED_OUTPATIENT_CLINIC_OR_DEPARTMENT_OTHER): Payer: Medicare Other | Admitting: Oncology

## 2019-08-19 ENCOUNTER — Other Ambulatory Visit: Payer: Self-pay

## 2019-08-19 ENCOUNTER — Encounter: Payer: Self-pay | Admitting: Oncology

## 2019-08-19 ENCOUNTER — Ambulatory Visit: Payer: Medicare Other

## 2019-08-19 ENCOUNTER — Inpatient Hospital Stay: Payer: Medicare Other

## 2019-08-19 VITALS — BP 142/74 | HR 55 | Temp 97.3°F | Resp 18 | Wt 208.7 lb

## 2019-08-19 DIAGNOSIS — I10 Essential (primary) hypertension: Secondary | ICD-10-CM | POA: Diagnosis not present

## 2019-08-19 DIAGNOSIS — Z170 Estrogen receptor positive status [ER+]: Secondary | ICD-10-CM

## 2019-08-19 DIAGNOSIS — C50111 Malignant neoplasm of central portion of right female breast: Secondary | ICD-10-CM

## 2019-08-19 DIAGNOSIS — Z5111 Encounter for antineoplastic chemotherapy: Secondary | ICD-10-CM | POA: Insufficient documentation

## 2019-08-19 DIAGNOSIS — E119 Type 2 diabetes mellitus without complications: Secondary | ICD-10-CM | POA: Diagnosis not present

## 2019-08-19 DIAGNOSIS — Z95828 Presence of other vascular implants and grafts: Secondary | ICD-10-CM

## 2019-08-19 LAB — CBC WITH DIFFERENTIAL/PLATELET
Abs Immature Granulocytes: 0.02 10*3/uL (ref 0.00–0.07)
Basophils Absolute: 0.1 10*3/uL (ref 0.0–0.1)
Basophils Relative: 1 %
Eosinophils Absolute: 1.3 10*3/uL — ABNORMAL HIGH (ref 0.0–0.5)
Eosinophils Relative: 25 %
HCT: 31.4 % — ABNORMAL LOW (ref 36.0–46.0)
Hemoglobin: 9.6 g/dL — ABNORMAL LOW (ref 12.0–15.0)
Immature Granulocytes: 0 %
Lymphocytes Relative: 18 %
Lymphs Abs: 0.9 10*3/uL (ref 0.7–4.0)
MCH: 27.6 pg (ref 26.0–34.0)
MCHC: 30.6 g/dL (ref 30.0–36.0)
MCV: 90.2 fL (ref 80.0–100.0)
Monocytes Absolute: 0.4 10*3/uL (ref 0.1–1.0)
Monocytes Relative: 8 %
Neutro Abs: 2.5 10*3/uL (ref 1.7–7.7)
Neutrophils Relative %: 48 %
Platelets: 192 10*3/uL (ref 150–400)
RBC: 3.48 MIL/uL — ABNORMAL LOW (ref 3.87–5.11)
RDW: 16.9 % — ABNORMAL HIGH (ref 11.5–15.5)
WBC: 5.1 10*3/uL (ref 4.0–10.5)
nRBC: 0 % (ref 0.0–0.2)

## 2019-08-19 LAB — COMPREHENSIVE METABOLIC PANEL
ALT: 15 U/L (ref 0–44)
AST: 16 U/L (ref 15–41)
Albumin: 3.8 g/dL (ref 3.5–5.0)
Alkaline Phosphatase: 53 U/L (ref 38–126)
Anion gap: 7 (ref 5–15)
BUN: 18 mg/dL (ref 8–23)
CO2: 25 mmol/L (ref 22–32)
Calcium: 9.3 mg/dL (ref 8.9–10.3)
Chloride: 108 mmol/L (ref 98–111)
Creatinine, Ser: 0.69 mg/dL (ref 0.44–1.00)
GFR calc Af Amer: >60 mL/min (ref >60)
GFR calc non Af Amer: >60 mL/min (ref >60)
Glucose, Bld: 121 mg/dL — ABNORMAL HIGH (ref 70–99)
Potassium: 3.8 mmol/L (ref 3.5–5.1)
Sodium: 140 mmol/L (ref 135–145)
Total Bilirubin: 0.6 mg/dL (ref 0.3–1.2)
Total Protein: 6.2 g/dL — ABNORMAL LOW (ref 6.5–8.1)

## 2019-08-19 MED ORDER — DEXAMETHASONE SODIUM PHOSPHATE 10 MG/ML IJ SOLN
10.0000 mg | Freq: Once | INTRAMUSCULAR | Status: AC
  Administered 2019-08-19: 10 mg via INTRAVENOUS
  Filled 2019-08-19: qty 1

## 2019-08-19 MED ORDER — SODIUM CHLORIDE 0.9% FLUSH
10.0000 mL | Freq: Once | INTRAVENOUS | Status: AC
  Administered 2019-08-19: 08:00:00 10 mL via INTRAVENOUS
  Filled 2019-08-19: qty 10

## 2019-08-19 MED ORDER — SODIUM CHLORIDE 0.9 % IV SOLN
Freq: Once | INTRAVENOUS | Status: AC
  Administered 2019-08-19: 10:00:00 via INTRAVENOUS
  Filled 2019-08-19: qty 250

## 2019-08-19 MED ORDER — FAMOTIDINE IN NACL 20-0.9 MG/50ML-% IV SOLN
20.0000 mg | Freq: Once | INTRAVENOUS | Status: AC
  Administered 2019-08-19: 20 mg via INTRAVENOUS
  Filled 2019-08-19: qty 50

## 2019-08-19 MED ORDER — HEPARIN SOD (PORK) LOCK FLUSH 100 UNIT/ML IV SOLN
500.0000 [IU] | Freq: Once | INTRAVENOUS | Status: AC | PRN
  Administered 2019-08-19: 500 [IU]
  Filled 2019-08-19: qty 5

## 2019-08-19 MED ORDER — SODIUM CHLORIDE 0.9 % IV SOLN
80.0000 mg/m2 | Freq: Once | INTRAVENOUS | Status: AC
  Administered 2019-08-19: 174 mg via INTRAVENOUS
  Filled 2019-08-19: qty 29

## 2019-08-19 MED ORDER — DIPHENHYDRAMINE HCL 50 MG/ML IJ SOLN
25.0000 mg | Freq: Once | INTRAMUSCULAR | Status: AC
  Administered 2019-08-19: 25 mg via INTRAVENOUS
  Filled 2019-08-19: qty 1

## 2019-08-19 NOTE — Progress Notes (Signed)
Patient here for a follow-up with no complaints.

## 2019-08-22 NOTE — Progress Notes (Signed)
[place]  [phone] [phone]  ID: KWYNN SCHLOTTER OB: 07/10/1951  MR#: [id number]  JJO#:[id number]  Patient Care Team: [name], PA-C as PCP - General (Family Medicine)  CHIEF COMPLAINT: Clinical stage IIa ER/PR positive, HER-2 negative invasive carcinoma of the central portion of the right breast.  INTERVAL HISTORY: Patient returns to clinic today for further evaluation and consideration of cycle 7 of 12 of weekly Taxol.  She continues to feel well and remains asymptomatic.  She is tolerating her treatments well without significant side effects. She has no neurologic complaints.  She denies any recent fevers or illnesses. She has a good appetite and denies weight loss.  She has no chest pain, shortness of breath, cough, or hemoptysis. She denies any nausea, vomiting, constipation, or diarrhea.  She has no urinary complaints.  Patient feels at her baseline offers no specific complaints today.  REVIEW OF SYSTEMS:   Review of Systems  Constitutional: Negative.  Negative for fever, malaise/fatigue and weight loss.  Respiratory: Negative.  Negative for cough, hemoptysis and shortness of breath.   Cardiovascular: Negative.  Negative for chest pain and leg swelling.  Gastrointestinal: Negative.  Negative for abdominal pain, diarrhea and nausea.  Genitourinary: Negative.  Negative for dysuria.  Musculoskeletal: Negative.  Negative for back pain and myalgias.  Skin: Negative.  Negative for rash.  Neurological: Negative.  Negative for focal weakness, weakness and headaches.  Psychiatric/Behavioral: Negative.  The patient is not nervous/anxious.     As per HPI. Otherwise, a complete review of systems is negative.  PAST MEDICAL HISTORY: Past Medical History:  Diagnosis Date  . Allergy   . Breast cancer, right ([place]) 2020   Currently taking hormonal chemo tx's.   . Diabetes mellitus without complication ([place]) 1601  . Hyperlipidemia   .  Hypertension   . Osteoporosis   . Trigeminal neuralgia     PAST SURGICAL HISTORY: Past Surgical History:  Procedure Laterality Date  . ABDOMINAL HYSTERECTOMY  1978  . BREAST BIOPSY Right 01/06/2019   [place] bx x 2.  Ribbon marker for mass and hydro for Lymph node. Path pending  . BREAST CYST EXCISION Right 1986  . BREAST EXCISIONAL BIOPSY    . IR IMAGING GUIDED PORT INSERTION  05/12/2019  . LAMINECTOMY  1995   L1, L2, L3  . SPINE SURGERY      FAMILY HISTORY: Family History  Adopted: Yes  Family history unknown: Yes    ADVANCED DIRECTIVES (Y/N):  N  HEALTH MAINTENANCE: Social History   Tobacco Use  . Smoking status: Former Smoker    Quit date: 03/16/2000    Years since quitting: 19.4  . Smokeless tobacco: Never Used  Substance Use Topics  . Alcohol use: No  . Drug use: No     Colonoscopy:  PAP:  Bone density:  Lipid panel:  Allergies  Allergen Reactions  . Morphine     Anaphallaxis.    Current Outpatient Medications  Medication Sig Dispense Refill  . ALPRAZolam (XANAX) 1 MG tablet TAKE ONE TABLET BY MOUTH AT BEDTIME AS NEEDED FOR ANXIETY 90 tablet 1  . amLODipine (NORVASC) 5 MG tablet Take 1 tablet (5 mg total) by mouth daily. 90 tablet 3  . baclofen (LIORESAL) 10 MG tablet Take 1 tablet (10 mg total) by mouth daily. 90 tablet 3  . bisoprolol-hydrochlorothiazide (ZIAC) 10-6.25 MG tablet Take 1 tablet by mouth 2 (two) times daily. 180 tablet 3  . buPROPion (WELLBUTRIN XL) 150 MG 24  hr tablet Take 1 tablet (150 mg total) by mouth 2 (two) times a day. 180 tablet 1  . Cholecalciferol (VITAMIN D3) 5000 units CAPS Take by mouth.    . letrozole (FEMARA) 2.5 MG tablet Take 1 tablet (2.5 mg total) by mouth daily. 90 tablet 3  . lidocaine-prilocaine (EMLA) cream Apply to affected area once 30 g 3  . lisinopril-hydrochlorothiazide (ZESTORETIC) 10-12.5 MG tablet Take 1 tablet by mouth daily. 90 tablet 3  . loratadine (CLARITIN) 10 MG tablet Take 1 tablet (10 mg total) by  mouth daily as needed. 90 tablet 3  . meloxicam (MOBIC) 15 MG tablet Take 1 tablet (15 mg total) by mouth daily. 90 tablet 3  . metFORMIN (GLUCOPHAGE) 1000 MG tablet Take 1 tablet (1,000 mg total) by mouth 2 (two) times daily with a meal. 180 tablet 3  . MULTIPLE VITAMINS PO MULTIVITAMINS (Oral Tablet)  1 Every Day for 0 days  Quantity: 0.00;  Refills: 0   Ordered :17-Apr-2011  [name] ;  Started 24-February-2009 Active    . OMEGA 3-6-9 FATTY ACIDS PO Reported on 10/04/2015    . ondansetron (ZOFRAN) 8 MG tablet Take 1 tablet (8 mg total) by mouth 2 (two) times daily as needed. 30 tablet 2  . prochlorperazine (COMPAZINE) 10 MG tablet Take 1 tablet (10 mg total) by mouth every 6 (six) hours as needed (Nausea or vomiting). 60 tablet 2  . rizatriptan (MAXALT) 5 MG tablet TAKE ONE TABLET BY MOUTH AS NEEDED FOR MIGRAINE. MAY REPEAT IN 2 HOURS IF NEEDED. 10 tablet 11  . simvastatin (ZOCOR) 20 MG tablet Take 1 tablet (20 mg total) by mouth at bedtime. 90 tablet 3  . Vitamin D, Ergocalciferol, (DRISDOL) 1.25 MG (50000 UT) CAPS capsule Take 1 capsule (50,000 Units total) by mouth every 7 (seven) days. 12 capsule 4  . vitamin E 1000 UNIT capsule Take 1,000 Units by mouth daily.     No current facility-administered medications for this visit.    OBJECTIVE: Vitals:   08/26/19 0845  BP: 118/70  Pulse: 64  Resp: 16  Temp: 98 F (36.7 C)  SpO2: 100%     Body mass index is 34.01 kg/m.    ECOG FS:0 - Asymptomatic  General: Well-developed, well-nourished, no acute distress. Eyes: Pink conjunctiva, anicteric sclera. HEENT: Normocephalic, moist mucous membranes. Breast: Exam deferred today. Lungs: No audible wheezing or coughing. Heart: Regular rate and rhythm. Abdomen: Soft, nontender, no obvious distention. Musculoskeletal: No edema, cyanosis, or clubbing. Neuro: Alert, answering all questions appropriately. Cranial nerves grossly intact. Skin: No rashes or petechiae noted. Psych: Normal affect.   LAB RESULTS:  Lab Results  Component Value Date   NA 137 08/26/2019   K 4.1 08/26/2019   CL 106 08/26/2019   CO2 24 08/26/2019   GLUCOSE 132 (H) 08/26/2019   BUN 23 08/26/2019   CREATININE 0.85 08/26/2019   CALCIUM 9.5 08/26/2019   PROT 6.6 08/26/2019   ALBUMIN 4.0 08/26/2019   AST 16 08/26/2019   ALT 16 08/26/2019   ALKPHOS 58 08/26/2019   BILITOT 0.6 08/26/2019   GFRNONAA >60 08/26/2019   GFRAA >60 08/26/2019    Lab Results  Component Value Date   WBC 5.8 08/26/2019   NEUTROABS 2.5 08/26/2019   HGB 10.3 (L) 08/26/2019   HCT 33.8 (L) 08/26/2019   MCV 90.6 08/26/2019   PLT 213 08/26/2019     STUDIES: No results found.  ASSESSMENT: Clinical stage IIa ER/PR positive, HER-2 negative invasive carcinoma of  the central portion of the right breast.  Oncotype DX score 7.  PLAN:   1. Clinical stage IIa ER/PR positive, HER-2 negative invasive carcinoma of the central portion of the right breast: Although patient is clinical stage IIa and typically neoadjuvant chemotherapy is the recommendation, patient initially did not wish to pursue aggressive immunosuppressive treatment during the COVID-19 pandemic.  Instead, patient received neoadjuvant letrozole from April 2020 through August 2020.  She then proceeded with neoadjuvant chemotherapy using Adriamycin, Cytoxan with Udenyca support followed by weekly Taxol x12.  Patient completed Adriamycin and Cytoxan on June 24, 2019.  Recent MRI results noted.  Repeat MUGA on June 09, 2019 revealed an ejection fraction of 52.2% which is unchanged from prior.  Proceed with cycle 7 of 12 of weekly Taxol today.  Return to clinic in 1 week for further evaluation and consideration of cycle 8. 2.  COVID positivity: Resolved.  Patient reports repeat testing is negative. It is likely that the groundglass densities seen on her CT scan from March 17, 2019 are residual from her infection.  No intervention is needed at this time. 3.  Thyroid nodule:  Biopsy reported as benign.  Likely multinodular goiter. 5.  Left breast nodule: 7 mm lesion noted on MRI, monitor. 6.  Anemia: Hemoglobin continues to trend up and is now 10.3.  Patient expressed understanding and was in agreement with this plan. She also understands that She can call clinic at any time with any questions, concerns, or complaints.   Cancer Staging Cancer of central portion of right female breast [facility]) Staging form: Breast, AJCC 8th Edition - Clinical stage from 01/13/2019: Stage IIA (cT2, cN1, cM0, G1, ER+, PR+, HER2-) - Signed by Lloyd Huger, MD on 01/13/2019   Lloyd Huger, MD   08/26/2019 6:29 PM

## 2019-08-25 ENCOUNTER — Other Ambulatory Visit: Payer: Self-pay

## 2019-08-25 NOTE — Progress Notes (Signed)
Patient pre screened for office appointment, no questions or concerns today. Patient reminded of upcoming appointment time and date. 

## 2019-08-26 ENCOUNTER — Other Ambulatory Visit: Payer: Self-pay

## 2019-08-26 ENCOUNTER — Inpatient Hospital Stay: Payer: Medicare Other

## 2019-08-26 ENCOUNTER — Inpatient Hospital Stay (HOSPITAL_BASED_OUTPATIENT_CLINIC_OR_DEPARTMENT_OTHER): Payer: Medicare Other | Admitting: Oncology

## 2019-08-26 VITALS — BP 118/70 | HR 64 | Temp 98.0°F | Resp 16 | Wt 204.4 lb

## 2019-08-26 DIAGNOSIS — Z170 Estrogen receptor positive status [ER+]: Secondary | ICD-10-CM | POA: Diagnosis not present

## 2019-08-26 DIAGNOSIS — C50111 Malignant neoplasm of central portion of right female breast: Secondary | ICD-10-CM | POA: Diagnosis not present

## 2019-08-26 DIAGNOSIS — Z5111 Encounter for antineoplastic chemotherapy: Secondary | ICD-10-CM | POA: Diagnosis not present

## 2019-08-26 LAB — CBC WITH DIFFERENTIAL/PLATELET
Abs Immature Granulocytes: 0.03 10*3/uL (ref 0.00–0.07)
Basophils Absolute: 0 10*3/uL (ref 0.0–0.1)
Basophils Relative: 1 %
Eosinophils Absolute: 1.6 10*3/uL — ABNORMAL HIGH (ref 0.0–0.5)
Eosinophils Relative: 28 %
HCT: 33.8 % — ABNORMAL LOW (ref 36.0–46.0)
Hemoglobin: 10.3 g/dL — ABNORMAL LOW (ref 12.0–15.0)
Immature Granulocytes: 1 %
Lymphocytes Relative: 23 %
Lymphs Abs: 1.3 10*3/uL (ref 0.7–4.0)
MCH: 27.6 pg (ref 26.0–34.0)
MCHC: 30.5 g/dL (ref 30.0–36.0)
MCV: 90.6 fL (ref 80.0–100.0)
Monocytes Absolute: 0.3 10*3/uL (ref 0.1–1.0)
Monocytes Relative: 6 %
Neutro Abs: 2.5 10*3/uL (ref 1.7–7.7)
Neutrophils Relative %: 41 %
Platelets: 213 10*3/uL (ref 150–400)
RBC: 3.73 MIL/uL — ABNORMAL LOW (ref 3.87–5.11)
RDW: 15.9 % — ABNORMAL HIGH (ref 11.5–15.5)
WBC: 5.8 10*3/uL (ref 4.0–10.5)
nRBC: 0 % (ref 0.0–0.2)

## 2019-08-26 LAB — COMPREHENSIVE METABOLIC PANEL
ALT: 16 U/L (ref 0–44)
AST: 16 U/L (ref 15–41)
Albumin: 4 g/dL (ref 3.5–5.0)
Alkaline Phosphatase: 58 U/L (ref 38–126)
Anion gap: 7 (ref 5–15)
BUN: 23 mg/dL (ref 8–23)
CO2: 24 mmol/L (ref 22–32)
Calcium: 9.5 mg/dL (ref 8.9–10.3)
Chloride: 106 mmol/L (ref 98–111)
Creatinine, Ser: 0.85 mg/dL (ref 0.44–1.00)
GFR calc Af Amer: >60 mL/min (ref >60)
GFR calc non Af Amer: >60 mL/min (ref >60)
Glucose, Bld: 132 mg/dL — ABNORMAL HIGH (ref 70–99)
Potassium: 4.1 mmol/L (ref 3.5–5.1)
Sodium: 137 mmol/L (ref 135–145)
Total Bilirubin: 0.6 mg/dL (ref 0.3–1.2)
Total Protein: 6.6 g/dL (ref 6.5–8.1)

## 2019-08-26 MED ORDER — DEXAMETHASONE SODIUM PHOSPHATE 10 MG/ML IJ SOLN
10.0000 mg | Freq: Once | INTRAMUSCULAR | Status: AC
  Administered 2019-08-26: 10 mg via INTRAVENOUS
  Filled 2019-08-26: qty 1

## 2019-08-26 MED ORDER — SODIUM CHLORIDE 0.9 % IV SOLN
Freq: Once | INTRAVENOUS | Status: AC
  Administered 2019-08-26: 09:00:00 via INTRAVENOUS
  Filled 2019-08-26: qty 250

## 2019-08-26 MED ORDER — SODIUM CHLORIDE 0.9 % IV SOLN
80.0000 mg/m2 | Freq: Once | INTRAVENOUS | Status: AC
  Administered 2019-08-26: 174 mg via INTRAVENOUS
  Filled 2019-08-26: qty 29

## 2019-08-26 MED ORDER — FAMOTIDINE IN NACL 20-0.9 MG/50ML-% IV SOLN
20.0000 mg | Freq: Once | INTRAVENOUS | Status: AC
  Administered 2019-08-26: 20 mg via INTRAVENOUS
  Filled 2019-08-26: qty 50

## 2019-08-26 MED ORDER — DIPHENHYDRAMINE HCL 50 MG/ML IJ SOLN
25.0000 mg | Freq: Once | INTRAMUSCULAR | Status: AC
  Administered 2019-08-26: 25 mg via INTRAVENOUS
  Filled 2019-08-26: qty 1

## 2019-08-26 MED ORDER — SODIUM CHLORIDE 0.9% FLUSH
10.0000 mL | INTRAVENOUS | Status: DC | PRN
  Administered 2019-08-26: 10 mL via INTRAVENOUS
  Filled 2019-08-26: qty 10

## 2019-08-26 MED ORDER — HEPARIN SOD (PORK) LOCK FLUSH 100 UNIT/ML IV SOLN
500.0000 [IU] | Freq: Once | INTRAVENOUS | Status: AC
  Administered 2019-08-26: 500 [IU] via INTRAVENOUS
  Filled 2019-08-26: qty 5

## 2019-08-26 MED ORDER — HEPARIN SOD (PORK) LOCK FLUSH 100 UNIT/ML IV SOLN: 500.0000 [IU] | Freq: Once | INTRAVENOUS | Status: DC | PRN

## 2019-08-29 NOTE — Progress Notes (Signed)
[place]  [phone]) [phone] [phone]) [phone]  ID: ATHINA FAHEY OB: 1951/07/25  MR#: [id number]  YTK#:[id number]  Patient Care Team: [name], PA-C as PCP - General (Family Medicine)  CHIEF COMPLAINT: Clinical stage IIa ER/PR positive, HER-2 negative invasive carcinoma of the central portion of the right breast.  INTERVAL HISTORY: Patient returns to clinic today for further evaluation and consideration of cycle 8 of 12 of weekly Taxol.  She continues to tolerate her treatments well without significant side effects.  She currently feels well and is asymptomatic.  She has no neurologic complaints.  She denies any recent fevers or illnesses. She has a good appetite and denies weight loss.  She has no chest pain, shortness of breath, cough, or hemoptysis. She denies any nausea, vomiting, constipation, or diarrhea.  She has no urinary complaints.  Patient offers no specific complaints today.  REVIEW OF SYSTEMS:   Review of Systems  Constitutional: Negative.  Negative for fever, malaise/fatigue and weight loss.  Respiratory: Negative.  Negative for cough, hemoptysis and shortness of breath.   Cardiovascular: Negative.  Negative for chest pain and leg swelling.  Gastrointestinal: Negative.  Negative for abdominal pain, diarrhea and nausea.  Genitourinary: Negative.  Negative for dysuria.  Musculoskeletal: Negative.  Negative for back pain and myalgias.  Skin: Negative.  Negative for rash.  Neurological: Negative.  Negative for focal weakness, weakness and headaches.  Psychiatric/Behavioral: Negative.  The patient is not nervous/anxious.     As per HPI. Otherwise, a complete review of systems is negative.  PAST MEDICAL HISTORY: Past Medical History:  Diagnosis Date  . Allergy   . Breast cancer, right ([place]) 2020   Currently taking hormonal chemo tx's.   . Diabetes mellitus without complication ([place]) 7517  . Hyperlipidemia   . Hypertension   .  Osteoporosis   . Trigeminal neuralgia     PAST SURGICAL HISTORY: Past Surgical History:  Procedure Laterality Date  . ABDOMINAL HYSTERECTOMY  1978  . BREAST BIOPSY Right 01/06/2019   [place] bx x 2.  Ribbon marker for mass and hydro for Lymph node. Path pending  . BREAST CYST EXCISION Right 1986  . BREAST EXCISIONAL BIOPSY    . IR IMAGING GUIDED PORT INSERTION  05/12/2019  . LAMINECTOMY  1995   L1, L2, L3  . SPINE SURGERY      FAMILY HISTORY: Family History  Adopted: Yes  Family history unknown: Yes    ADVANCED DIRECTIVES (Y/N):  N  HEALTH MAINTENANCE: Social History   Tobacco Use  . Smoking status: Former Smoker    Quit date: 03/16/2000    Years since quitting: 19.4  . Smokeless tobacco: Never Used  Substance Use Topics  . Alcohol use: No  . Drug use: No     Colonoscopy:  PAP:  Bone density:  Lipid panel:  Allergies  Allergen Reactions  . Morphine     Anaphallaxis.    Current Outpatient Medications  Medication Sig Dispense Refill  . ALPRAZolam (XANAX) 1 MG tablet TAKE ONE TABLET BY MOUTH AT BEDTIME AS NEEDED FOR ANXIETY 90 tablet 1  . amLODipine (NORVASC) 5 MG tablet Take 1 tablet (5 mg total) by mouth daily. 90 tablet 3  . baclofen (LIORESAL) 10 MG tablet Take 1 tablet (10 mg total) by mouth daily. 90 tablet 3  . bisoprolol-hydrochlorothiazide (ZIAC) 10-6.25 MG tablet Take 1 tablet by mouth 2 (two) times daily. 180 tablet 3  . buPROPion (WELLBUTRIN XL) 150 MG 24 hr tablet Take  1 tablet (150 mg total) by mouth 2 (two) times a day. 180 tablet 1  . Cholecalciferol (VITAMIN D3) 5000 units CAPS Take by mouth.    . letrozole (FEMARA) 2.5 MG tablet Take 1 tablet (2.5 mg total) by mouth daily. 90 tablet 3  . lidocaine-prilocaine (EMLA) cream Apply to affected area once 30 g 3  . lisinopril-hydrochlorothiazide (ZESTORETIC) 10-12.5 MG tablet Take 1 tablet by mouth daily. 90 tablet 3  . loratadine (CLARITIN) 10 MG tablet Take 1 tablet (10 mg total) by mouth daily as  needed. 90 tablet 3  . meloxicam (MOBIC) 15 MG tablet Take 1 tablet (15 mg total) by mouth daily. 90 tablet 3  . metFORMIN (GLUCOPHAGE) 1000 MG tablet Take 1 tablet (1,000 mg total) by mouth 2 (two) times daily with a meal. 180 tablet 3  . MULTIPLE VITAMINS PO MULTIVITAMINS (Oral Tablet)  1 Every Day for 0 days  Quantity: 0.00;  Refills: 0   Ordered :17-Apr-2011  King, Sandy ;  Started 24-February-2009 Active    . OMEGA 3-6-9 FATTY ACIDS PO Reported on 10/04/2015    . ondansetron (ZOFRAN) 8 MG tablet Take 1 tablet (8 mg total) by mouth 2 (two) times daily as needed. 30 tablet 2  . prochlorperazine (COMPAZINE) 10 MG tablet Take 1 tablet (10 mg total) by mouth every 6 (six) hours as needed (Nausea or vomiting). 60 tablet 2  . rizatriptan (MAXALT) 5 MG tablet TAKE ONE TABLET BY MOUTH AS NEEDED FOR MIGRAINE. MAY REPEAT IN 2 HOURS IF NEEDED. 10 tablet 11  . simvastatin (ZOCOR) 20 MG tablet Take 1 tablet (20 mg total) by mouth at bedtime. 90 tablet 3  . Vitamin D, Ergocalciferol, (DRISDOL) 1.25 MG (50000 UT) CAPS capsule Take 1 capsule (50,000 Units total) by mouth every 7 (seven) days. 12 capsule 4  . vitamin E 1000 UNIT capsule Take 1,000 Units by mouth daily.     No current facility-administered medications for this visit.    OBJECTIVE: Vitals:   09/02/19 1136  BP: (!) 141/80  Pulse: 61  Temp: 98 F (36.7 C)     Body mass index is 34.61 kg/m.    ECOG FS:0 - Asymptomatic  General: Well-developed, well-nourished, no acute distress. Eyes: Pink conjunctiva, anicteric sclera. HEENT: Normocephalic, moist mucous membranes. Lungs: No audible wheezing or coughing. Heart: Regular rate and rhythm. Abdomen: Soft, nontender, no obvious distention. Musculoskeletal: No edema, cyanosis, or clubbing. Neuro: Alert, answering all questions appropriately. Cranial nerves grossly intact. Skin: No rashes or petechiae noted. Psych: Normal affect.  LAB RESULTS:  Lab Results  Component Value Date   NA 141  09/02/2019   K 4.8 09/02/2019   CL 111 09/02/2019   CO2 22 09/02/2019   GLUCOSE 138 (H) 09/02/2019   BUN 23 09/02/2019   CREATININE 0.68 09/02/2019   CALCIUM 9.2 09/02/2019   PROT 6.1 (L) 09/02/2019   ALBUMIN 3.7 09/02/2019   AST 15 09/02/2019   ALT 15 09/02/2019   ALKPHOS 52 09/02/2019   BILITOT 0.4 09/02/2019   GFRNONAA >60 09/02/2019   GFRAA >60 09/02/2019    Lab Results  Component Value Date   WBC 5.0 09/02/2019   NEUTROABS 2.8 09/02/2019   HGB 9.4 (L) 09/02/2019   HCT 31.1 (L) 09/02/2019   MCV 92.6 09/02/2019   PLT 200 09/02/2019     STUDIES: No results found.  ASSESSMENT: Clinical stage IIa ER/PR positive, HER-2 negative invasive carcinoma of the central portion of the right breast.  Oncotype DX score   7.  PLAN:   1. Clinical stage IIa ER/PR positive, HER-2 negative invasive carcinoma of the central portion of the right breast: Although patient is clinical stage IIa and typically neoadjuvant chemotherapy is the recommendation, patient initially did not wish to pursue aggressive immunosuppressive treatment during the COVID-19 pandemic.  Instead, patient received neoadjuvant letrozole from April 2020 through August 2020.  She then proceeded with neoadjuvant chemotherapy using Adriamycin, Cytoxan with Udenyca support followed by weekly Taxol x12.  Patient completed Adriamycin and Cytoxan on June 24, 2019.  Recent MRI results noted.  Repeat MUGA on June 09, 2019 revealed an ejection fraction of 52.2% which is unchanged from prior.  Proceed with cycle 8 of 12 of weekly Taxol today.  Return to clinic in 1 week for further evaluation and consideration of cycle 9.   2.  COVID positivity: Resolved.  Patient reports repeat testing is negative. It is likely that the groundglass densities seen on her CT scan from March 17, 2019 are residual from her infection.  No intervention is needed at this time. 3.  Thyroid nodule: Biopsy reported as benign.  Likely multinodular goiter. 4.   Left breast nodule: 7 mm lesion noted on MRI, monitor. 5.  Anemia: Hemoglobin has trended down slightly to 9.4, monitor.  Patient expressed understanding and was in agreement with this plan. She also understands that She can call clinic at any time with any questions, concerns, or complaints.   Cancer Staging Cancer of central portion of right female breast ([facility]) Staging form: Breast, AJCC 8th Edition - Clinical stage from 01/13/2019: Stage IIA (cT2, cN1, cM0, G1, ER+, PR+, HER2-) - Signed by Finnegan, Timothy J, MD on 01/13/2019   Timothy J Finnegan, MD   09/02/2019 2:04 PM     

## 2019-09-01 ENCOUNTER — Encounter: Payer: Self-pay | Admitting: Oncology

## 2019-09-01 NOTE — Progress Notes (Signed)
Patient prescreened for appointment. Patient has no concerns or questions.  

## 2019-09-02 ENCOUNTER — Inpatient Hospital Stay: Payer: Medicare Other

## 2019-09-02 ENCOUNTER — Other Ambulatory Visit: Payer: Self-pay | Admitting: Oncology

## 2019-09-02 ENCOUNTER — Other Ambulatory Visit: Payer: Self-pay

## 2019-09-02 ENCOUNTER — Inpatient Hospital Stay (HOSPITAL_BASED_OUTPATIENT_CLINIC_OR_DEPARTMENT_OTHER): Payer: Medicare Other | Admitting: Oncology

## 2019-09-02 VITALS — BP 141/80 | HR 61 | Temp 98.0°F | Wt 208.0 lb

## 2019-09-02 DIAGNOSIS — C50111 Malignant neoplasm of central portion of right female breast: Secondary | ICD-10-CM | POA: Diagnosis not present

## 2019-09-02 DIAGNOSIS — Z170 Estrogen receptor positive status [ER+]: Secondary | ICD-10-CM

## 2019-09-02 DIAGNOSIS — Z95828 Presence of other vascular implants and grafts: Secondary | ICD-10-CM

## 2019-09-02 DIAGNOSIS — Z5111 Encounter for antineoplastic chemotherapy: Secondary | ICD-10-CM | POA: Diagnosis not present

## 2019-09-02 LAB — COMPREHENSIVE METABOLIC PANEL
ALT: 15 U/L (ref 0–44)
AST: 15 U/L (ref 15–41)
Albumin: 3.7 g/dL (ref 3.5–5.0)
Alkaline Phosphatase: 52 U/L (ref 38–126)
Anion gap: 8 (ref 5–15)
BUN: 23 mg/dL (ref 8–23)
CO2: 22 mmol/L (ref 22–32)
Calcium: 9.2 mg/dL (ref 8.9–10.3)
Chloride: 111 mmol/L (ref 98–111)
Creatinine, Ser: 0.68 mg/dL (ref 0.44–1.00)
GFR calc Af Amer: >60 mL/min (ref >60)
GFR calc non Af Amer: >60 mL/min (ref >60)
Glucose, Bld: 138 mg/dL — ABNORMAL HIGH (ref 70–99)
Potassium: 4.8 mmol/L (ref 3.5–5.1)
Sodium: 141 mmol/L (ref 135–145)
Total Bilirubin: 0.4 mg/dL (ref 0.3–1.2)
Total Protein: 6.1 g/dL — ABNORMAL LOW (ref 6.5–8.1)

## 2019-09-02 LAB — CBC WITH DIFFERENTIAL/PLATELET
Abs Immature Granulocytes: 0.03 10*3/uL (ref 0.00–0.07)
Basophils Absolute: 0 10*3/uL (ref 0.0–0.1)
Basophils Relative: 1 %
Eosinophils Absolute: 1.2 10*3/uL — ABNORMAL HIGH (ref 0.0–0.5)
Eosinophils Relative: 23 %
HCT: 31.1 % — ABNORMAL LOW (ref 36.0–46.0)
Hemoglobin: 9.4 g/dL — ABNORMAL LOW (ref 12.0–15.0)
Immature Granulocytes: 1 %
Lymphocytes Relative: 15 %
Lymphs Abs: 0.8 10*3/uL (ref 0.7–4.0)
MCH: 28 pg (ref 26.0–34.0)
MCHC: 30.2 g/dL (ref 30.0–36.0)
MCV: 92.6 fL (ref 80.0–100.0)
Monocytes Absolute: 0.2 10*3/uL (ref 0.1–1.0)
Monocytes Relative: 4 %
Neutro Abs: 2.8 10*3/uL (ref 1.7–7.7)
Neutrophils Relative %: 56 %
Platelets: 200 10*3/uL (ref 150–400)
RBC: 3.36 MIL/uL — ABNORMAL LOW (ref 3.87–5.11)
RDW: 15.6 % — ABNORMAL HIGH (ref 11.5–15.5)
WBC: 5 10*3/uL (ref 4.0–10.5)
nRBC: 0 % (ref 0.0–0.2)

## 2019-09-02 MED ORDER — FAMOTIDINE IN NACL 20-0.9 MG/50ML-% IV SOLN
20.0000 mg | Freq: Once | INTRAVENOUS | Status: AC
  Administered 2019-09-02: 20 mg via INTRAVENOUS
  Filled 2019-09-02: qty 50

## 2019-09-02 MED ORDER — DIPHENHYDRAMINE HCL 50 MG/ML IJ SOLN
25.0000 mg | Freq: Once | INTRAMUSCULAR | Status: AC
  Administered 2019-09-02: 25 mg via INTRAVENOUS
  Filled 2019-09-02: qty 1

## 2019-09-02 MED ORDER — HEPARIN SOD (PORK) LOCK FLUSH 100 UNIT/ML IV SOLN
500.0000 [IU] | Freq: Once | INTRAVENOUS | Status: AC | PRN
  Administered 2019-09-02: 500 [IU]
  Filled 2019-09-02: qty 5

## 2019-09-02 MED ORDER — SODIUM CHLORIDE 0.9 % IV SOLN
Freq: Once | INTRAVENOUS | Status: AC
  Filled 2019-09-02: qty 250

## 2019-09-02 MED ORDER — SODIUM CHLORIDE 0.9% FLUSH
10.0000 mL | Freq: Once | INTRAVENOUS | Status: AC
  Administered 2019-09-02: 11:00:00 10 mL via INTRAVENOUS
  Filled 2019-09-02: qty 10

## 2019-09-02 MED ORDER — DEXAMETHASONE SODIUM PHOSPHATE 10 MG/ML IJ SOLN
10.0000 mg | Freq: Once | INTRAMUSCULAR | Status: AC
  Administered 2019-09-02: 10 mg via INTRAVENOUS
  Filled 2019-09-02: qty 1

## 2019-09-02 MED ORDER — HEPARIN SOD (PORK) LOCK FLUSH 100 UNIT/ML IV SOLN
INTRAVENOUS | Status: AC
  Filled 2019-09-02: qty 5

## 2019-09-02 MED ORDER — SODIUM CHLORIDE 0.9 % IV SOLN
80.0000 mg/m2 | Freq: Once | INTRAVENOUS | Status: AC
  Administered 2019-09-02: 174 mg via INTRAVENOUS
  Filled 2019-09-02: qty 29

## 2019-09-02 NOTE — Progress Notes (Signed)
[name]  [phone] [phone]  ID: Nichole Martinez OB: 1951-04-11  MR#: [id number]  KNL#:[id number]  Patient Care Team: [name], PA-C as PCP - General (Family Medicine)  CHIEF COMPLAINT: Clinical stage IIa ER/PR positive, HER-2 negative invasive carcinoma of the central portion of the right breast.  INTERVAL HISTORY: Patient returns clinic today for further evaluation and consideration of cycle 9 of 12 of weekly Taxol.  She continues to feel well and remains asymptomatic.  She is tolerating her treatments without significant side effects. She has no neurologic complaints.  She denies any recent fevers or illnesses. She has a good appetite and denies weight loss.  She has no chest pain, shortness of breath, cough, or hemoptysis. She denies any nausea, vomiting, constipation, or diarrhea.  She has no urinary complaints.  Patient feels that her baseline offers no specific complaints today.  REVIEW OF SYSTEMS:   Review of Systems  Constitutional: Negative.  Negative for fever, malaise/fatigue and weight loss.  Respiratory: Negative.  Negative for cough, hemoptysis and shortness of breath.   Cardiovascular: Negative.  Negative for chest pain and leg swelling.  Gastrointestinal: Negative.  Negative for abdominal pain, diarrhea and nausea.  Genitourinary: Negative.  Negative for dysuria.  Musculoskeletal: Negative.  Negative for back pain and myalgias.  Skin: Negative.  Negative for rash.  Neurological: Negative.  Negative for focal weakness, weakness and headaches.  Psychiatric/Behavioral: Negative.  The patient is not nervous/anxious.     As per HPI. Otherwise, a complete review of systems is negative.  PAST MEDICAL HISTORY: Past Medical History:  Diagnosis Date  . Allergy   . Breast cancer, right ([facility]) 2020   Currently taking hormonal chemo tx's.   . Diabetes mellitus without complication ([place]) 7902  . Hyperlipidemia   .  Hypertension   . Osteoporosis   . Trigeminal neuralgia     PAST SURGICAL HISTORY: Past Surgical History:  Procedure Laterality Date  . ABDOMINAL HYSTERECTOMY  1978  . BREAST BIOPSY Right 01/06/2019   [place] bx x 2.  Ribbon marker for mass and hydro for Lymph node. Path pending  . BREAST CYST EXCISION Right 1986  . BREAST EXCISIONAL BIOPSY    . IR IMAGING GUIDED PORT INSERTION  05/12/2019  . LAMINECTOMY  1995   L1, L2, L3  . SPINE SURGERY      FAMILY HISTORY: Family History  Adopted: Yes  Family history unknown: Yes    ADVANCED DIRECTIVES (Y/N):  N  HEALTH MAINTENANCE: Social History   Tobacco Use  . Smoking status: Former Smoker    Quit date: 03/16/2000    Years since quitting: 19.4  . Smokeless tobacco: Never Used  Substance Use Topics  . Alcohol use: No  . Drug use: No     Colonoscopy:  PAP:  Bone density:  Lipid panel:  Allergies  Allergen Reactions  . Morphine     Anaphallaxis.    Current Outpatient Medications  Medication Sig Dispense Refill  . ALPRAZolam (XANAX) 1 MG tablet TAKE ONE TABLET BY MOUTH AT BEDTIME AS NEEDED FOR ANXIETY 90 tablet 1  . amLODipine (NORVASC) 5 MG tablet Take 1 tablet (5 mg total) by mouth daily. 90 tablet 3  . baclofen (LIORESAL) 10 MG tablet Take 1 tablet (10 mg total) by mouth daily. 90 tablet 3  . bisoprolol-hydrochlorothiazide (ZIAC) 10-6.25 MG tablet Take 1 tablet by mouth 2 (two) times daily. 180 tablet 3  . buPROPion (WELLBUTRIN XL) 150 MG 24 hr tablet  Take 1 tablet (150 mg total) by mouth 2 (two) times a day. 180 tablet 1  . Cholecalciferol (VITAMIN D3) 5000 units CAPS Take by mouth.    . letrozole (FEMARA) 2.5 MG tablet Take 1 tablet (2.5 mg total) by mouth daily. 90 tablet 3  . lidocaine-prilocaine (EMLA) cream Apply to affected area once 30 g 3  . lisinopril-hydrochlorothiazide (ZESTORETIC) 10-12.5 MG tablet Take 1 tablet by mouth daily. 90 tablet 3  . loratadine (CLARITIN) 10 MG tablet Take 1 tablet (10 mg total) by  mouth daily as needed. 90 tablet 3  . meloxicam (MOBIC) 15 MG tablet Take 1 tablet (15 mg total) by mouth daily. 90 tablet 3  . metFORMIN (GLUCOPHAGE) 1000 MG tablet Take 1 tablet (1,000 mg total) by mouth 2 (two) times daily with a meal. 180 tablet 3  . MULTIPLE VITAMINS PO MULTIVITAMINS (Oral Tablet)  1 Every Day for 0 days  Quantity: 0.00;  Refills: 0   Ordered :17-Apr-2011  [name] ;  Started 24-February-2009 Active    . OMEGA 3-6-9 FATTY ACIDS PO Reported on 10/04/2015    . ondansetron (ZOFRAN) 8 MG tablet Take 1 tablet (8 mg total) by mouth 2 (two) times daily as needed. 30 tablet 2  . prochlorperazine (COMPAZINE) 10 MG tablet Take 1 tablet (10 mg total) by mouth every 6 (six) hours as needed (Nausea or vomiting). 60 tablet 2  . rizatriptan (MAXALT) 5 MG tablet TAKE ONE TABLET BY MOUTH AS NEEDED FOR MIGRAINE. MAY REPEAT IN 2 HOURS IF NEEDED. 10 tablet 11  . simvastatin (ZOCOR) 20 MG tablet Take 1 tablet (20 mg total) by mouth at bedtime. 90 tablet 3  . Vitamin D, Ergocalciferol, (DRISDOL) 1.25 MG (50000 UT) CAPS capsule Take 1 capsule (50,000 Units total) by mouth every 7 (seven) days. 12 capsule 4  . vitamin E 1000 UNIT capsule Take 1,000 Units by mouth daily.     No current facility-administered medications for this visit.   Facility-Administered Medications Ordered in Other Visits  Medication Dose Route Frequency Provider Last Rate Last Admin  . heparin lock flush 100 unit/mL  500 Units Intravenous Once Lloyd Huger, MD      . heparin lock flush 100 unit/mL  500 Units Intravenous Once Lloyd Huger, MD      . heparin lock flush 100 unit/mL  500 Units Intracatheter Once PRN Lloyd Huger, MD      . PACLitaxel (TAXOL) 174 mg in sodium chloride 0.9 % 250 mL chemo infusion (</= 84m/m2)  80 mg/m2 (Treatment Plan Recorded) Intravenous Once FLloyd Huger MD 279 mL/hr at 09/09/19 0904 174 mg at 09/09/19 0904  . sodium chloride flush (NS) 0.9 % injection 10 mL  10 mL  Intravenous Once FLloyd Huger MD      . sodium chloride flush (NS) 0.9 % injection 10 mL  10 mL Intravenous PRN FLloyd Huger MD   10 mL at 09/09/19 0732    OBJECTIVE: There were no vitals filed for this visit.   There is no height or weight on file to calculate BMI.    ECOG FS:0 - Asymptomatic  General: Well-developed, well-nourished, no acute distress. Eyes: Pink conjunctiva, anicteric sclera. HEENT: Normocephalic, moist mucous membranes. Lungs: No audible wheezing or coughing. Heart: Regular rate and rhythm. Abdomen: Soft, nontender, no obvious distention. Musculoskeletal: No edema, cyanosis, or clubbing. Neuro: Alert, answering all questions appropriately. Cranial nerves grossly intact. Skin: No rashes or petechiae noted. Psych: Normal affect.  LAB RESULTS:  Lab Results  Component Value Date   NA 139 09/09/2019   K 4.3 09/09/2019   CL 107 09/09/2019   CO2 23 09/09/2019   GLUCOSE 128 (H) 09/09/2019   BUN 26 (H) 09/09/2019   CREATININE 0.75 09/09/2019   CALCIUM 9.3 09/09/2019   PROT 6.3 (L) 09/09/2019   ALBUMIN 4.0 09/09/2019   AST 16 09/09/2019   ALT 15 09/09/2019   ALKPHOS 51 09/09/2019   BILITOT 0.5 09/09/2019   GFRNONAA >60 09/09/2019   GFRAA >60 09/09/2019    Lab Results  Component Value Date   WBC 4.1 09/09/2019   NEUTROABS 2.2 09/09/2019   HGB 10.0 (L) 09/09/2019   HCT 31.9 (L) 09/09/2019   MCV 90.6 09/09/2019   PLT 232 09/09/2019     STUDIES: No results found.  ASSESSMENT: Clinical stage IIa ER/PR positive, HER-2 negative invasive carcinoma of the central portion of the right breast.  Oncotype DX score 7.  PLAN:   1. Clinical stage IIa ER/PR positive, HER-2 negative invasive carcinoma of the central portion of the right breast: Although patient is clinical stage IIa and typically neoadjuvant chemotherapy is the recommendation, patient initially did not wish to pursue aggressive immunosuppressive treatment during the COVID-19  pandemic.  Instead, patient received neoadjuvant letrozole from April 2020 through August 2020.  She then proceeded with neoadjuvant chemotherapy using Adriamycin, Cytoxan with Udenyca support followed by weekly Taxol x12.  Patient completed Adriamycin and Cytoxan on June 24, 2019.  Recent MRI results noted.  Repeat MUGA on June 09, 2019 revealed an ejection fraction of 52.2% which is unchanged from prior.  Proceed with cycle 9 of 12 of weekly Taxol today.  Return to clinic in 1 week for further evaluation and consideration of cycle 10.  Patient has a follow-up appointment with surgery on October 01, 2019. 2.  COVID positivity: Resolved.  Patient reports repeat testing is negative. It is likely that the groundglass densities seen on her CT scan from March 17, 2019 are residual from her infection.  No intervention is needed at this time. 3.  Thyroid nodule: Biopsy reported as benign.  Likely multinodular goiter. 4.  Left breast nodule: 7 mm lesion noted on MRI, monitor. 5.  Anemia: Hemoglobin has trended up slightly to 10.0, monitor.  I spent a total of 25 minutes face-to-face with the patient and reviewing chart data of which greater than 50% of the visit was spent in counseling and coordination of care as detailed above.   Patient expressed understanding and was in agreement with this plan. She also understands that She can call clinic at any time with any questions, concerns, or complaints.   Cancer Staging Cancer of central portion of right female breast [facility]) Staging form: Breast, AJCC 8th Edition - Clinical stage from 01/13/2019: Stage IIA (cT2, cN1, cM0, G1, ER+, PR+, HER2-) - Signed by Lloyd Huger, MD on 01/13/2019   Lloyd Huger, MD   09/09/2019 9:39 AM

## 2019-09-08 ENCOUNTER — Other Ambulatory Visit: Payer: Self-pay

## 2019-09-08 NOTE — Progress Notes (Signed)
Patient pre screened for office appointment, no questions or concerns today. Patient reminded of upcoming appointment time and date. 

## 2019-09-09 ENCOUNTER — Other Ambulatory Visit: Payer: Self-pay

## 2019-09-09 ENCOUNTER — Inpatient Hospital Stay (HOSPITAL_BASED_OUTPATIENT_CLINIC_OR_DEPARTMENT_OTHER): Payer: Medicare Other | Admitting: Oncology

## 2019-09-09 ENCOUNTER — Inpatient Hospital Stay: Payer: Medicare Other

## 2019-09-09 VITALS — BP 144/81 | HR 77 | Temp 98.1°F | Resp 17 | Wt 205.2 lb

## 2019-09-09 DIAGNOSIS — C50111 Malignant neoplasm of central portion of right female breast: Secondary | ICD-10-CM | POA: Diagnosis not present

## 2019-09-09 DIAGNOSIS — Z170 Estrogen receptor positive status [ER+]: Secondary | ICD-10-CM

## 2019-09-09 DIAGNOSIS — Z5111 Encounter for antineoplastic chemotherapy: Secondary | ICD-10-CM | POA: Diagnosis not present

## 2019-09-09 LAB — CBC WITH DIFFERENTIAL/PLATELET
Abs Immature Granulocytes: 0.01 10*3/uL (ref 0.00–0.07)
Basophils Absolute: 0.1 10*3/uL (ref 0.0–0.1)
Basophils Relative: 1 %
Eosinophils Absolute: 0.5 10*3/uL (ref 0.0–0.5)
Eosinophils Relative: 13 %
HCT: 31.9 % — ABNORMAL LOW (ref 36.0–46.0)
Hemoglobin: 10 g/dL — ABNORMAL LOW (ref 12.0–15.0)
Immature Granulocytes: 0 %
Lymphocytes Relative: 26 %
Lymphs Abs: 1.1 10*3/uL (ref 0.7–4.0)
MCH: 28.4 pg (ref 26.0–34.0)
MCHC: 31.3 g/dL (ref 30.0–36.0)
MCV: 90.6 fL (ref 80.0–100.0)
Monocytes Absolute: 0.3 10*3/uL (ref 0.1–1.0)
Monocytes Relative: 6 %
Neutro Abs: 2.2 10*3/uL (ref 1.7–7.7)
Neutrophils Relative %: 54 %
Platelets: 232 10*3/uL (ref 150–400)
RBC: 3.52 MIL/uL — ABNORMAL LOW (ref 3.87–5.11)
RDW: 15.3 % (ref 11.5–15.5)
WBC: 4.1 10*3/uL (ref 4.0–10.5)
nRBC: 0 % (ref 0.0–0.2)

## 2019-09-09 LAB — COMPREHENSIVE METABOLIC PANEL
ALT: 15 U/L (ref 0–44)
AST: 16 U/L (ref 15–41)
Albumin: 4 g/dL (ref 3.5–5.0)
Alkaline Phosphatase: 51 U/L (ref 38–126)
Anion gap: 9 (ref 5–15)
BUN: 26 mg/dL — ABNORMAL HIGH (ref 8–23)
CO2: 23 mmol/L (ref 22–32)
Calcium: 9.3 mg/dL (ref 8.9–10.3)
Chloride: 107 mmol/L (ref 98–111)
Creatinine, Ser: 0.75 mg/dL (ref 0.44–1.00)
GFR calc Af Amer: >60 mL/min (ref >60)
GFR calc non Af Amer: >60 mL/min (ref >60)
Glucose, Bld: 128 mg/dL — ABNORMAL HIGH (ref 70–99)
Potassium: 4.3 mmol/L (ref 3.5–5.1)
Sodium: 139 mmol/L (ref 135–145)
Total Bilirubin: 0.5 mg/dL (ref 0.3–1.2)
Total Protein: 6.3 g/dL — ABNORMAL LOW (ref 6.5–8.1)

## 2019-09-09 MED ORDER — HEPARIN SOD (PORK) LOCK FLUSH 100 UNIT/ML IV SOLN
INTRAVENOUS | Status: AC
  Filled 2019-09-09: qty 5

## 2019-09-09 MED ORDER — SODIUM CHLORIDE 0.9% FLUSH
10.0000 mL | INTRAVENOUS | Status: DC | PRN
  Administered 2019-09-09: 10 mL via INTRAVENOUS
  Filled 2019-09-09: qty 10

## 2019-09-09 MED ORDER — DIPHENHYDRAMINE HCL 50 MG/ML IJ SOLN
25.0000 mg | Freq: Once | INTRAMUSCULAR | Status: AC
  Administered 2019-09-09: 25 mg via INTRAVENOUS
  Filled 2019-09-09: qty 1

## 2019-09-09 MED ORDER — HEPARIN SOD (PORK) LOCK FLUSH 100 UNIT/ML IV SOLN
500.0000 [IU] | Freq: Once | INTRAVENOUS | Status: AC
  Filled 2019-09-09: qty 5

## 2019-09-09 MED ORDER — SODIUM CHLORIDE 0.9 % IV SOLN
Freq: Once | INTRAVENOUS | Status: AC
  Filled 2019-09-09: qty 250

## 2019-09-09 MED ORDER — HEPARIN SOD (PORK) LOCK FLUSH 100 UNIT/ML IV SOLN
500.0000 [IU] | Freq: Once | INTRAVENOUS | Status: DC | PRN
  Filled 2019-09-09: qty 5

## 2019-09-09 MED ORDER — HEPARIN SOD (PORK) LOCK FLUSH 100 UNIT/ML IV SOLN
500.0000 [IU] | Freq: Once | INTRAVENOUS | Status: AC
  Administered 2019-09-09: 500 [IU] via INTRAVENOUS
  Filled 2019-09-09: qty 5

## 2019-09-09 MED ORDER — DEXAMETHASONE SODIUM PHOSPHATE 10 MG/ML IJ SOLN
10.0000 mg | Freq: Once | INTRAMUSCULAR | Status: AC
  Administered 2019-09-09: 10 mg via INTRAVENOUS
  Filled 2019-09-09: qty 1

## 2019-09-09 MED ORDER — FAMOTIDINE IN NACL 20-0.9 MG/50ML-% IV SOLN
20.0000 mg | Freq: Once | INTRAVENOUS | Status: AC
  Administered 2019-09-09: 20 mg via INTRAVENOUS
  Filled 2019-09-09: qty 50

## 2019-09-09 MED ORDER — SODIUM CHLORIDE 0.9 % IV SOLN
80.0000 mg/m2 | Freq: Once | INTRAVENOUS | Status: AC
  Administered 2019-09-09: 174 mg via INTRAVENOUS
  Filled 2019-09-09: qty 29

## 2019-09-09 MED ORDER — SODIUM CHLORIDE 0.9% FLUSH
10.0000 mL | Freq: Once | INTRAVENOUS | Status: AC
  Filled 2019-09-09: qty 10

## 2019-09-09 NOTE — Progress Notes (Signed)
[place]  [phone] [phone]) [phone]  ID: Nichole Martinez OB: [birth year]  MR#: [id number]  UXY#:[id number]  Patient Care Team: Nichole Daring, PA-C as PCP - General (Family Medicine)  CHIEF COMPLAINT: Clinical stage IIa ER/PR positive, HER-2 negative invasive carcinoma of the central portion of the right breast.  INTERVAL HISTORY: Patient returns to clinic today for further evaluation and consideration of cycle 10 of 12 of weekly Taxol.  She continues to tolerate her treatments well without significant side effects.  She currently feels well and is asymptomatic.  She has no neurologic complaints. She denies any recent fevers or illnesses. She has a good appetite and denies weight loss.  She has no chest pain, shortness of breath, cough, or hemoptysis. She denies any nausea, vomiting, constipation, or diarrhea.  She has no urinary complaints.  Patient offers no specific complaints today.  REVIEW OF SYSTEMS:   Review of Systems  Constitutional: Negative.  Negative for fever, malaise/fatigue and weight loss.  Respiratory: Negative.  Negative for cough, hemoptysis and shortness of breath.   Cardiovascular: Negative.  Negative for chest pain and leg swelling.  Gastrointestinal: Negative.  Negative for abdominal pain, diarrhea and nausea.  Genitourinary: Negative.  Negative for dysuria.  Musculoskeletal: Negative.  Negative for back pain and myalgias.  Skin: Negative.  Negative for rash.  Neurological: Negative.  Negative for focal weakness, weakness and headaches.  Psychiatric/Behavioral: Negative.  The patient is not nervous/anxious.     As per HPI. Otherwise, a complete review of systems is negative.  PAST MEDICAL HISTORY: Past Medical History:  Diagnosis Date  . Allergy   . Breast cancer, right ([place]) 2020   Currently taking hormonal chemo tx's.   . Diabetes mellitus without complication ([place]) 1660  . Hyperlipidemia   . Hypertension   .  Osteoporosis   . Trigeminal neuralgia     PAST SURGICAL HISTORY: Past Surgical History:  Procedure Laterality Date  . ABDOMINAL HYSTERECTOMY  1978  . BREAST BIOPSY Right 01/06/2019   [place] bx x 2.  Ribbon marker for mass and hydro for Lymph node. Path pending  . BREAST CYST EXCISION Right 1986  . BREAST EXCISIONAL BIOPSY    . IR IMAGING GUIDED PORT INSERTION  05/12/2019  . LAMINECTOMY  1995   L1, L2, L3  . SPINE SURGERY      FAMILY HISTORY: Family History  Adopted: Yes  Family history unknown: Yes    ADVANCED DIRECTIVES (Y/N):  N  HEALTH MAINTENANCE: Social History   Tobacco Use  . Smoking status: Former Smoker    Quit date: 03/16/2000    Years since quitting: 19.5  . Smokeless tobacco: Never Used  Substance Use Topics  . Alcohol use: No  . Drug use: No     Colonoscopy:  PAP:  Bone density:  Lipid panel:  Allergies  Allergen Reactions  . Morphine     Anaphallaxis.    Current Outpatient Medications  Medication Sig Dispense Refill  . ALPRAZolam (XANAX) 1 MG tablet TAKE ONE TABLET BY MOUTH AT BEDTIME AS NEEDED FOR ANXIETY 90 tablet 1  . amLODipine (NORVASC) 5 MG tablet Take 1 tablet (5 mg total) by mouth daily. 90 tablet 3  . baclofen (LIORESAL) 10 MG tablet Take 1 tablet (10 mg total) by mouth daily. 90 tablet 3  . bisoprolol-hydrochlorothiazide (ZIAC) 10-6.25 MG tablet Take 1 tablet by mouth 2 (two) times daily. 180 tablet 3  . buPROPion (WELLBUTRIN XL) 150 MG 24 hr tablet Take 1  tablet (150 mg total) by mouth 2 (two) times a day. 180 tablet 1  . Cholecalciferol (VITAMIN D3) 5000 units CAPS Take by mouth.    . letrozole (FEMARA) 2.5 MG tablet Take 1 tablet (2.5 mg total) by mouth daily. 90 tablet 3  . lidocaine-prilocaine (EMLA) cream Apply to affected area once 30 g 3  . lisinopril-hydrochlorothiazide (ZESTORETIC) 10-12.5 MG tablet Take 1 tablet by mouth daily. 90 tablet 3  . loratadine (CLARITIN) 10 MG tablet Take 1 tablet (10 mg total) by mouth daily as  needed. 90 tablet 3  . meloxicam (MOBIC) 15 MG tablet Take 1 tablet (15 mg total) by mouth daily. 90 tablet 3  . metFORMIN (GLUCOPHAGE) 1000 MG tablet Take 1 tablet (1,000 mg total) by mouth 2 (two) times daily with a meal. 180 tablet 3  . MULTIPLE VITAMINS PO MULTIVITAMINS (Oral Tablet)  1 Every Day for 0 days  Quantity: 0.00;  Refills: 0   Ordered :17-Apr-2011  [name] ;  Started 24-February-2009 Active    . OMEGA 3-6-9 FATTY ACIDS PO Reported on 10/04/2015    . ondansetron (ZOFRAN) 8 MG tablet Take 1 tablet (8 mg total) by mouth 2 (two) times daily as needed. 30 tablet 2  . prochlorperazine (COMPAZINE) 10 MG tablet Take 1 tablet (10 mg total) by mouth every 6 (six) hours as needed (Nausea or vomiting). 60 tablet 2  . rizatriptan (MAXALT) 5 MG tablet TAKE ONE TABLET BY MOUTH AS NEEDED FOR MIGRAINE. MAY REPEAT IN 2 HOURS IF NEEDED. 10 tablet 11  . simvastatin (ZOCOR) 20 MG tablet Take 1 tablet (20 mg total) by mouth at bedtime. 90 tablet 3  . Vitamin D, Ergocalciferol, (DRISDOL) 1.25 MG (50000 UT) CAPS capsule Take 1 capsule (50,000 Units total) by mouth every 7 (seven) days. 12 capsule 4  . vitamin E 1000 UNIT capsule Take 1,000 Units by mouth daily.     No current facility-administered medications for this visit.   Facility-Administered Medications Ordered in Other Visits  Medication Dose Route Frequency Provider Last Rate Last Admin  . heparin lock flush 100 unit/mL  500 Units Intravenous Once Lloyd Huger, MD      . sodium chloride flush (NS) 0.9 % injection 10 mL  10 mL Intravenous Once Lloyd Huger, MD      Graduate for companion yesterday dog  OBJECTIVE: Vitals:   09/16/19 1323  BP: 119/77  Pulse: 72  Resp: 18  Temp: (!) 97.2 F (36.2 C)  SpO2: 100%     Body mass index is 33.27 kg/m.    ECOG FS:0 - Asymptomatic  General: Well-developed, well-nourished, no acute distress. Eyes: Pink conjunctiva, anicteric sclera. HEENT: Normocephalic, moist mucous  membranes. Lungs: No audible wheezing or coughing. Heart: Regular rate and rhythm. Abdomen: Soft, nontender, no obvious distention. Musculoskeletal: No edema, cyanosis, or clubbing. Neuro: Alert, answering all questions appropriately. Cranial nerves grossly intact. Skin: No rashes or petechiae noted. Psych: Normal affect.   LAB RESULTS:  Lab Results  Component Value Date   NA 137 09/16/2019   K 3.8 09/16/2019   CL 105 09/16/2019   CO2 23 09/16/2019   GLUCOSE 150 (H) 09/16/2019   BUN 19 09/16/2019   CREATININE 0.73 09/16/2019   CALCIUM 9.4 09/16/2019   PROT 6.5 09/16/2019   ALBUMIN 3.9 09/16/2019   AST 16 09/16/2019   ALT 15 09/16/2019   ALKPHOS 54 09/16/2019   BILITOT 0.5 09/16/2019   GFRNONAA >60 09/16/2019   GFRAA >60 09/16/2019  Lab Results  Component Value Date   WBC 3.4 (L) 09/16/2019   NEUTROABS 1.6 (L) 09/16/2019   HGB 10.5 (L) 09/16/2019   HCT 33.6 (L) 09/16/2019   MCV 90.1 09/16/2019   PLT 220 09/16/2019     STUDIES: No results found.  ASSESSMENT: Clinical stage IIa ER/PR positive, HER-2 negative invasive carcinoma of the central portion of the right breast.  Oncotype DX score 7.  PLAN:   1. Clinical stage IIa ER/PR positive, HER-2 negative invasive carcinoma of the central portion of the right breast: Although patient is clinical stage IIa and typically neoadjuvant chemotherapy is the recommendation, patient initially did not wish to pursue aggressive immunosuppressive treatment during the COVID-19 pandemic.  Instead, patient received neoadjuvant letrozole from April 2020 through August 2020.  She then proceeded with neoadjuvant chemotherapy using Adriamycin, Cytoxan with Udenyca support followed by weekly Taxol x12.  Patient completed Adriamycin and Cytoxan on June 24, 2019.  Recent MRI results noted.  Repeat MUGA on June 09, 2019 revealed an ejection fraction of 52.2% which is unchanged from prior.  Proceed with cycle 10 of 12 of weekly Taxol  today.  Return to clinic in 1 week for further evaluation and consideration of cycle 11. Patient has a follow-up appointment with surgery on October 01, 2019. 2.  COVID positivity: Resolved.  Patient reports repeat testing is negative. It is likely that the groundglass densities seen on her CT scan from March 17, 2019 are residual from her infection.  No intervention is needed at this time. 3.  Thyroid nodule: Biopsy reported as benign.  Likely multinodular goiter. 4.  Left breast nodule: 7 mm lesion noted on MRI, monitor. 5.  Anemia: Hemoglobin slowly trending up and is now 10.5. 6.  Neutropenia: Mild, monitor.  Patient expressed understanding and was in agreement with this plan. She also understands that She can call clinic at any time with any questions, concerns, or complaints.   Cancer Staging Cancer of central portion of right female breast [facility]) Staging form: Breast, AJCC 8th Edition - Clinical stage from 01/13/2019: Stage IIA (cT2, cN1, cM0, G1, ER+, PR+, HER2-) - Signed by Lloyd Huger, MD on 01/13/2019   Lloyd Huger, MD   09/17/2019 9:32 AM

## 2019-09-16 ENCOUNTER — Inpatient Hospital Stay (HOSPITAL_BASED_OUTPATIENT_CLINIC_OR_DEPARTMENT_OTHER): Payer: Medicare Other | Admitting: Oncology

## 2019-09-16 ENCOUNTER — Inpatient Hospital Stay: Payer: Medicare Other

## 2019-09-16 ENCOUNTER — Encounter: Payer: Self-pay | Admitting: Oncology

## 2019-09-16 ENCOUNTER — Other Ambulatory Visit: Payer: Self-pay

## 2019-09-16 VITALS — BP 119/77 | HR 72 | Temp 97.2°F | Resp 18 | Wt 199.9 lb

## 2019-09-16 DIAGNOSIS — Z170 Estrogen receptor positive status [ER+]: Secondary | ICD-10-CM | POA: Diagnosis not present

## 2019-09-16 DIAGNOSIS — C50111 Malignant neoplasm of central portion of right female breast: Secondary | ICD-10-CM | POA: Diagnosis not present

## 2019-09-16 DIAGNOSIS — Z5111 Encounter for antineoplastic chemotherapy: Secondary | ICD-10-CM | POA: Diagnosis not present

## 2019-09-16 DIAGNOSIS — Z95828 Presence of other vascular implants and grafts: Secondary | ICD-10-CM

## 2019-09-16 LAB — CBC WITH DIFFERENTIAL/PLATELET
Abs Immature Granulocytes: 0.01 10*3/uL (ref 0.00–0.07)
Basophils Absolute: 0 10*3/uL (ref 0.0–0.1)
Basophils Relative: 1 %
Eosinophils Absolute: 0.5 10*3/uL (ref 0.0–0.5)
Eosinophils Relative: 13 %
HCT: 33.6 % — ABNORMAL LOW (ref 36.0–46.0)
Hemoglobin: 10.5 g/dL — ABNORMAL LOW (ref 12.0–15.0)
Immature Granulocytes: 0 %
Lymphocytes Relative: 33 %
Lymphs Abs: 1.1 10*3/uL (ref 0.7–4.0)
MCH: 28.2 pg (ref 26.0–34.0)
MCHC: 31.3 g/dL (ref 30.0–36.0)
MCV: 90.1 fL (ref 80.0–100.0)
Monocytes Absolute: 0.2 10*3/uL (ref 0.1–1.0)
Monocytes Relative: 6 %
Neutro Abs: 1.6 10*3/uL — ABNORMAL LOW (ref 1.7–7.7)
Neutrophils Relative %: 47 %
Platelets: 220 10*3/uL (ref 150–400)
RBC: 3.73 MIL/uL — ABNORMAL LOW (ref 3.87–5.11)
RDW: 14.9 % (ref 11.5–15.5)
WBC: 3.4 10*3/uL — ABNORMAL LOW (ref 4.0–10.5)
nRBC: 0 % (ref 0.0–0.2)

## 2019-09-16 LAB — COMPREHENSIVE METABOLIC PANEL
ALT: 15 U/L (ref 0–44)
AST: 16 U/L (ref 15–41)
Albumin: 3.9 g/dL (ref 3.5–5.0)
Alkaline Phosphatase: 54 U/L (ref 38–126)
Anion gap: 9 (ref 5–15)
BUN: 19 mg/dL (ref 8–23)
CO2: 23 mmol/L (ref 22–32)
Calcium: 9.4 mg/dL (ref 8.9–10.3)
Chloride: 105 mmol/L (ref 98–111)
Creatinine, Ser: 0.73 mg/dL (ref 0.44–1.00)
GFR calc Af Amer: >60 mL/min (ref >60)
GFR calc non Af Amer: >60 mL/min (ref >60)
Glucose, Bld: 150 mg/dL — ABNORMAL HIGH (ref 70–99)
Potassium: 3.8 mmol/L (ref 3.5–5.1)
Sodium: 137 mmol/L (ref 135–145)
Total Bilirubin: 0.5 mg/dL (ref 0.3–1.2)
Total Protein: 6.5 g/dL (ref 6.5–8.1)

## 2019-09-16 MED ORDER — HEPARIN SOD (PORK) LOCK FLUSH 100 UNIT/ML IV SOLN
500.0000 [IU] | Freq: Once | INTRAVENOUS | Status: AC | PRN
  Administered 2019-09-16: 500 [IU]
  Filled 2019-09-16: qty 5

## 2019-09-16 MED ORDER — SODIUM CHLORIDE 0.9% FLUSH
10.0000 mL | Freq: Once | INTRAVENOUS | Status: AC
  Administered 2019-09-16: 13:00:00 10 mL via INTRAVENOUS
  Filled 2019-09-16: qty 10

## 2019-09-16 MED ORDER — DIPHENHYDRAMINE HCL 50 MG/ML IJ SOLN
25.0000 mg | Freq: Once | INTRAMUSCULAR | Status: AC
  Administered 2019-09-16: 14:00:00 25 mg via INTRAVENOUS
  Filled 2019-09-16: qty 1

## 2019-09-16 MED ORDER — DEXAMETHASONE SODIUM PHOSPHATE 10 MG/ML IJ SOLN
10.0000 mg | Freq: Once | INTRAMUSCULAR | Status: AC
  Administered 2019-09-16: 10 mg via INTRAVENOUS
  Filled 2019-09-16: qty 1

## 2019-09-16 MED ORDER — SODIUM CHLORIDE 0.9 % IV SOLN
Freq: Once | INTRAVENOUS | Status: AC
  Filled 2019-09-16: qty 250

## 2019-09-16 MED ORDER — HEPARIN SOD (PORK) LOCK FLUSH 100 UNIT/ML IV SOLN
INTRAVENOUS | Status: AC
  Filled 2019-09-16: qty 5

## 2019-09-16 MED ORDER — FAMOTIDINE IN NACL 20-0.9 MG/50ML-% IV SOLN
20.0000 mg | Freq: Once | INTRAVENOUS | Status: AC
  Administered 2019-09-16: 14:00:00 20 mg via INTRAVENOUS
  Filled 2019-09-16: qty 50

## 2019-09-16 MED ORDER — SODIUM CHLORIDE 0.9 % IV SOLN
80.0000 mg/m2 | Freq: Once | INTRAVENOUS | Status: AC
  Administered 2019-09-16: 174 mg via INTRAVENOUS
  Filled 2019-09-16: qty 29

## 2019-09-17 NOTE — Progress Notes (Signed)
[place]  [phone] [phone]) [phone]  ID: ASHANTE YELLIN OB: 12-29-1950  MR#: [id number]  JHE#:[id number]  Patient Care Team: [name], PA-C as PCP - General (Family Medicine)  CHIEF COMPLAINT: Clinical stage IIa ER/PR positive, HER-2 negative invasive carcinoma of the central portion of the right breast.  INTERVAL HISTORY: Patient returns to clinic today for further evaluation and consideration of cycle 11 of 12 of weekly Taxol.  She continues to tolerate her treatments well without side effects.  She currently feels well and is asymptomatic. She has no neurologic complaints. She denies any recent fevers or illnesses. She has a good appetite and denies weight loss.  She has no chest pain, shortness of breath, cough, or hemoptysis. She denies any nausea, vomiting, constipation, or diarrhea.  She has no urinary complaints.  Patient offers no specific complaints today.  REVIEW OF SYSTEMS:   Review of Systems  Constitutional: Negative.  Negative for fever, malaise/fatigue and weight loss.  Respiratory: Negative.  Negative for cough, hemoptysis and shortness of breath.   Cardiovascular: Negative.  Negative for chest pain and leg swelling.  Gastrointestinal: Negative.  Negative for abdominal pain, diarrhea and nausea.  Genitourinary: Negative.  Negative for dysuria.  Musculoskeletal: Negative.  Negative for back pain and myalgias.  Skin: Negative.  Negative for rash.  Neurological: Negative.  Negative for focal weakness, weakness and headaches.  Psychiatric/Behavioral: Negative.  The patient is not nervous/anxious.     As per HPI. Otherwise, a complete review of systems is negative.  PAST MEDICAL HISTORY: Past Medical History:  Diagnosis Date  . Allergy   . Breast cancer, right ([place]) 2020   Currently taking hormonal chemo tx's.   . Diabetes mellitus without complication ([place]) 1856  . Hyperlipidemia   . Hypertension   . Osteoporosis     . Trigeminal neuralgia     PAST SURGICAL HISTORY: Past Surgical History:  Procedure Laterality Date  . ABDOMINAL HYSTERECTOMY  1978  . BREAST BIOPSY Right 01/06/2019   [place] bx x 2.  Ribbon marker for mass and hydro for Lymph node. Path pending  . BREAST CYST EXCISION Right 1986  . BREAST EXCISIONAL BIOPSY    . IR IMAGING GUIDED PORT INSERTION  05/12/2019  . LAMINECTOMY  1995   L1, L2, L3  . SPINE SURGERY      FAMILY HISTORY: Family History  Adopted: Yes  Family history unknown: Yes    ADVANCED DIRECTIVES (Y/N):  N  HEALTH MAINTENANCE: Social History   Tobacco Use  . Smoking status: Former Smoker    Quit date: 03/16/2000    Years since quitting: 19.5  . Smokeless tobacco: Never Used  Substance Use Topics  . Alcohol use: No  . Drug use: No     Colonoscopy:  PAP:  Bone density:  Lipid panel:  Allergies  Allergen Reactions  . Morphine     Anaphallaxis.    Current Outpatient Medications  Medication Sig Dispense Refill  . ALPRAZolam (XANAX) 1 MG tablet TAKE ONE TABLET BY MOUTH AT BEDTIME AS NEEDED FOR ANXIETY 90 tablet 1  . amLODipine (NORVASC) 5 MG tablet Take 1 tablet (5 mg total) by mouth daily. 90 tablet 3  . baclofen (LIORESAL) 10 MG tablet Take 1 tablet (10 mg total) by mouth daily. 90 tablet 3  . bisoprolol-hydrochlorothiazide (ZIAC) 10-6.25 MG tablet Take 1 tablet by mouth 2 (two) times daily. 180 tablet 3  . buPROPion (WELLBUTRIN XL) 150 MG 24 hr tablet Take 1 tablet (  150 mg total) by mouth 2 (two) times a day. 180 tablet 1  . Cholecalciferol (VITAMIN D3) 5000 units CAPS Take by mouth.    . letrozole (FEMARA) 2.5 MG tablet Take 1 tablet (2.5 mg total) by mouth daily. 90 tablet 3  . lidocaine-prilocaine (EMLA) cream Apply to affected area once 30 g 3  . lisinopril-hydrochlorothiazide (ZESTORETIC) 10-12.5 MG tablet Take 1 tablet by mouth daily. 90 tablet 3  . loratadine (CLARITIN) 10 MG tablet Take 1 tablet (10 mg total) by mouth daily as needed. 90 tablet 3   . meloxicam (MOBIC) 15 MG tablet Take 1 tablet (15 mg total) by mouth daily. 90 tablet 3  . metFORMIN (GLUCOPHAGE) 1000 MG tablet Take 1 tablet (1,000 mg total) by mouth 2 (two) times daily with a meal. 180 tablet 3  . MULTIPLE VITAMINS PO MULTIVITAMINS (Oral Tablet)  1 Every Day for 0 days  Quantity: 0.00;  Refills: 0   Ordered :17-Apr-2011  [name] ;  Started 24-February-2009 Active    . OMEGA 3-6-9 FATTY ACIDS PO Reported on 10/04/2015    . ondansetron (ZOFRAN) 8 MG tablet Take 1 tablet (8 mg total) by mouth 2 (two) times daily as needed. 30 tablet 2  . prochlorperazine (COMPAZINE) 10 MG tablet Take 1 tablet (10 mg total) by mouth every 6 (six) hours as needed (Nausea or vomiting). 60 tablet 2  . rizatriptan (MAXALT) 5 MG tablet TAKE ONE TABLET BY MOUTH AS NEEDED FOR MIGRAINE. MAY REPEAT IN 2 HOURS IF NEEDED. 10 tablet 11  . simvastatin (ZOCOR) 20 MG tablet Take 1 tablet (20 mg total) by mouth at bedtime. 90 tablet 3  . Vitamin D, Ergocalciferol, (DRISDOL) 1.25 MG (50000 UT) CAPS capsule Take 1 capsule (50,000 Units total) by mouth every 7 (seven) days. 12 capsule 4  . vitamin E 1000 UNIT capsule Take 1,000 Units by mouth daily.     No current facility-administered medications for this visit.   Facility-Administered Medications Ordered in Other Visits  Medication Dose Route Frequency Provider Last Rate Last Admin  . dexamethasone (DECADRON) injection 10 mg  10 mg Intravenous Once Lloyd Huger, MD      . diphenhydrAMINE (BENADRYL) injection 25 mg  25 mg Intravenous Once Lloyd Huger, MD      . famotidine (PEPCID) IVPB 20 mg premix  20 mg Intravenous Once Lloyd Huger, MD      . heparin lock flush 100 unit/mL  500 Units Intravenous Once Lloyd Huger, MD      . heparin lock flush 100 unit/mL  500 Units Intracatheter Once PRN Lloyd Huger, MD      . PACLitaxel (TAXOL) 174 mg in sodium chloride 0.9 % 250 mL chemo infusion (</= 67m/m2)  80 mg/m2 (Treatment Plan  Recorded) Intravenous Once FLloyd Huger MD      . sodium chloride flush (NS) 0.9 % injection 10 mL  10 mL Intravenous Once FLloyd Huger MD      . sodium chloride flush (NS) 0.9 % injection 10 mL  10 mL Intracatheter PRN FLloyd Huger MD      Graduate for companion yesterday dog  OBJECTIVE: Vitals:   09/23/19 0837  BP: (!) 110/57  Pulse: 64  Resp: 18  Temp: (!) 97.1 F (36.2 C)  SpO2: 98%     Body mass index is 33.9 kg/m.    ECOG FS:0 - Asymptomatic  General: Well-developed, well-nourished, no acute distress. Eyes: Pink conjunctiva, anicteric sclera. HEENT:  Normocephalic, moist mucous membranes. Lungs: No audible wheezing or coughing. Heart: Regular rate and rhythm. Abdomen: Soft, nontender, no obvious distention. Musculoskeletal: No edema, cyanosis, or clubbing. Neuro: Alert, answering all questions appropriately. Cranial nerves grossly intact. Skin: No rashes or petechiae noted. Psych: Normal affect.  LAB RESULTS:  Lab Results  Component Value Date   NA 138 09/23/2019   K 4.0 09/23/2019   CL 106 09/23/2019   CO2 23 09/23/2019   GLUCOSE 133 (H) 09/23/2019   BUN 23 09/23/2019   CREATININE 0.74 09/23/2019   CALCIUM 9.4 09/23/2019   PROT 6.5 09/23/2019   ALBUMIN 3.9 09/23/2019   AST 14 (L) 09/23/2019   ALT 15 09/23/2019   ALKPHOS 55 09/23/2019   BILITOT 0.3 09/23/2019   GFRNONAA >60 09/23/2019   GFRAA >60 09/23/2019    Lab Results  Component Value Date   WBC 4.9 09/23/2019   NEUTROABS 3.1 09/23/2019   HGB 10.8 (L) 09/23/2019   HCT 34.6 (L) 09/23/2019   MCV 90.6 09/23/2019   PLT 243 09/23/2019     STUDIES: No results found.  ASSESSMENT: Clinical stage IIa ER/PR positive, HER-2 negative invasive carcinoma of the central portion of the right breast.  Oncotype DX score 7.  PLAN:   1. Clinical stage IIa ER/PR positive, HER-2 negative invasive carcinoma of the central portion of the right breast: Although patient is clinical stage IIa  and typically neoadjuvant chemotherapy is the recommendation, patient initially did not wish to pursue aggressive immunosuppressive treatment during the COVID-19 pandemic.  Instead, patient received neoadjuvant letrozole from April 2020 through August 2020.  She then proceeded with neoadjuvant chemotherapy using Adriamycin, Cytoxan with Udenyca support followed by weekly Taxol x12.  Patient completed Adriamycin and Cytoxan on June 24, 2019.  Recent MRI results noted.  Repeat MUGA on June 09, 2019 revealed an ejection fraction of 52.2% which is unchanged from prior.  Proceed with cycle 11 of 12 of weekly Taxol today.  Return to clinic in 1 week for further evaluation and consideration of her 12th and final treatment.  Patient has a follow-up appointment with surgery on October 01, 2019. 2.  COVID positivity: Resolved.  Patient reports repeat testing is negative. It is likely that the groundglass densities seen on her CT scan from March 17, 2019 are residual from her infection.  No intervention is needed at this time. 3.  Thyroid nodule: Biopsy reported as benign.  Likely multinodular goiter. 4.  Left breast nodule: 7 mm lesion noted on MRI, monitor.  Patient reports she is likely going to pursue bilateral mastectomy. 5.  Anemia: Hemoglobin slowly improving and is now 10.8. 6.  Neutropenia: Resolved.  Patient expressed understanding and was in agreement with this plan. She also understands that She can call clinic at any time with any questions, concerns, or complaints.   Cancer Staging Cancer of central portion of right female breast [facility]) Staging form: Breast, AJCC 8th Edition - Clinical stage from 01/13/2019: Stage IIA (cT2, cN1, cM0, G1, ER+, PR+, HER2-) - Signed by Lloyd Huger, MD on 01/13/2019   Lloyd Huger, MD   09/23/2019 9:08 AM

## 2019-09-22 ENCOUNTER — Encounter: Payer: Self-pay | Admitting: Oncology

## 2019-09-22 ENCOUNTER — Other Ambulatory Visit: Payer: Self-pay

## 2019-09-22 NOTE — Progress Notes (Signed)
Patient prescreened for appointment. Patient has no concerns or questions.  

## 2019-09-23 ENCOUNTER — Other Ambulatory Visit: Payer: Self-pay

## 2019-09-23 ENCOUNTER — Inpatient Hospital Stay: Payer: Medicare PPO

## 2019-09-23 ENCOUNTER — Inpatient Hospital Stay: Payer: Medicare PPO | Attending: Oncology

## 2019-09-23 ENCOUNTER — Inpatient Hospital Stay (HOSPITAL_BASED_OUTPATIENT_CLINIC_OR_DEPARTMENT_OTHER): Payer: Medicare PPO | Admitting: Oncology

## 2019-09-23 VITALS — BP 110/57 | HR 64 | Temp 97.1°F | Resp 18 | Wt 203.7 lb

## 2019-09-23 DIAGNOSIS — Z170 Estrogen receptor positive status [ER+]: Secondary | ICD-10-CM

## 2019-09-23 DIAGNOSIS — C50111 Malignant neoplasm of central portion of right female breast: Secondary | ICD-10-CM | POA: Insufficient documentation

## 2019-09-23 DIAGNOSIS — Z5111 Encounter for antineoplastic chemotherapy: Secondary | ICD-10-CM | POA: Insufficient documentation

## 2019-09-23 DIAGNOSIS — I10 Essential (primary) hypertension: Secondary | ICD-10-CM | POA: Insufficient documentation

## 2019-09-23 LAB — COMPREHENSIVE METABOLIC PANEL
ALT: 15 U/L (ref 0–44)
AST: 14 U/L — ABNORMAL LOW (ref 15–41)
Albumin: 3.9 g/dL (ref 3.5–5.0)
Alkaline Phosphatase: 55 U/L (ref 38–126)
Anion gap: 9 (ref 5–15)
BUN: 23 mg/dL (ref 8–23)
CO2: 23 mmol/L (ref 22–32)
Calcium: 9.4 mg/dL (ref 8.9–10.3)
Chloride: 106 mmol/L (ref 98–111)
Creatinine, Ser: 0.74 mg/dL (ref 0.44–1.00)
GFR calc Af Amer: >60 mL/min (ref >60)
GFR calc non Af Amer: >60 mL/min (ref >60)
Glucose, Bld: 133 mg/dL — ABNORMAL HIGH (ref 70–99)
Potassium: 4 mmol/L (ref 3.5–5.1)
Sodium: 138 mmol/L (ref 135–145)
Total Bilirubin: 0.3 mg/dL (ref 0.3–1.2)
Total Protein: 6.5 g/dL (ref 6.5–8.1)

## 2019-09-23 LAB — CBC WITH DIFFERENTIAL/PLATELET
Abs Immature Granulocytes: 0.03 10*3/uL (ref 0.00–0.07)
Basophils Absolute: 0 10*3/uL (ref 0.0–0.1)
Basophils Relative: 1 %
Eosinophils Absolute: 0.2 10*3/uL (ref 0.0–0.5)
Eosinophils Relative: 4 %
HCT: 34.6 % — ABNORMAL LOW (ref 36.0–46.0)
Hemoglobin: 10.8 g/dL — ABNORMAL LOW (ref 12.0–15.0)
Immature Granulocytes: 1 %
Lymphocytes Relative: 25 %
Lymphs Abs: 1.2 10*3/uL (ref 0.7–4.0)
MCH: 28.3 pg (ref 26.0–34.0)
MCHC: 31.2 g/dL (ref 30.0–36.0)
MCV: 90.6 fL (ref 80.0–100.0)
Monocytes Absolute: 0.3 10*3/uL (ref 0.1–1.0)
Monocytes Relative: 6 %
Neutro Abs: 3.1 10*3/uL (ref 1.7–7.7)
Neutrophils Relative %: 63 %
Platelets: 243 10*3/uL (ref 150–400)
RBC: 3.82 MIL/uL — ABNORMAL LOW (ref 3.87–5.11)
RDW: 14.8 % (ref 11.5–15.5)
WBC: 4.9 10*3/uL (ref 4.0–10.5)
nRBC: 0 % (ref 0.0–0.2)

## 2019-09-23 MED ORDER — DIPHENHYDRAMINE HCL 50 MG/ML IJ SOLN
25.0000 mg | Freq: Once | INTRAMUSCULAR | Status: AC
  Administered 2019-09-23: 25 mg via INTRAVENOUS
  Filled 2019-09-23: qty 1

## 2019-09-23 MED ORDER — HEPARIN SOD (PORK) LOCK FLUSH 100 UNIT/ML IV SOLN
INTRAVENOUS | Status: AC
  Filled 2019-09-23: qty 5

## 2019-09-23 MED ORDER — SODIUM CHLORIDE 0.9 % IV SOLN
80.0000 mg/m2 | Freq: Once | INTRAVENOUS | Status: AC
  Administered 2019-09-23: 174 mg via INTRAVENOUS
  Filled 2019-09-23: qty 29

## 2019-09-23 MED ORDER — DEXAMETHASONE SODIUM PHOSPHATE 10 MG/ML IJ SOLN
10.0000 mg | Freq: Once | INTRAMUSCULAR | Status: AC
  Administered 2019-09-23: 10 mg via INTRAVENOUS
  Filled 2019-09-23: qty 1

## 2019-09-23 MED ORDER — FAMOTIDINE IN NACL 20-0.9 MG/50ML-% IV SOLN
20.0000 mg | Freq: Once | INTRAVENOUS | Status: AC
  Administered 2019-09-23: 20 mg via INTRAVENOUS
  Filled 2019-09-23: qty 50

## 2019-09-23 MED ORDER — SODIUM CHLORIDE 0.9% FLUSH
10.0000 mL | INTRAVENOUS | Status: DC | PRN
  Administered 2019-09-23: 10 mL
  Filled 2019-09-23: qty 10

## 2019-09-23 MED ORDER — HEPARIN SOD (PORK) LOCK FLUSH 100 UNIT/ML IV SOLN
500.0000 [IU] | Freq: Once | INTRAVENOUS | Status: AC | PRN
  Administered 2019-09-23: 500 [IU]
  Filled 2019-09-23: qty 5

## 2019-09-23 MED ORDER — SODIUM CHLORIDE 0.9 % IV SOLN
Freq: Once | INTRAVENOUS | Status: AC
  Filled 2019-09-23: qty 250

## 2019-09-25 NOTE — Progress Notes (Signed)
[place]  Telephone:(336) [phone] Fax:(336) [phone]  ID: Nichole Martinez OB: 01/17/1951  MR#: [id number]  FTD#:[id number]  Patient Care Team: [name], PA-C as PCP - General (Family Medicine)  CHIEF COMPLAINT: Clinical stage IIa ER/PR positive, HER-2 negative invasive carcinoma of the central portion of the right breast.  INTERVAL HISTORY: Patient returns to clinic today for further evaluation and consideration of her 12th and final cycle of weekly Taxol.  She continues to feel well and remains asymptomatic.  She is tolerating her treatments without significant side effects.  She denies peripheral neuropathy and has no neurologic complaints. She denies any recent fevers or illnesses. She has a good appetite and denies weight loss.  She has no chest pain, shortness of breath, cough, or hemoptysis. She denies any nausea, vomiting, constipation, or diarrhea.  She has no urinary complaints.  Patient offers no specific complaints today.  REVIEW OF SYSTEMS:   Review of Systems  Constitutional: Negative.  Negative for fever, malaise/fatigue and weight loss.  Respiratory: Negative.  Negative for cough, hemoptysis and shortness of breath.   Cardiovascular: Negative.  Negative for chest pain and leg swelling.  Gastrointestinal: Negative.  Negative for abdominal pain, diarrhea and nausea.  Genitourinary: Negative.  Negative for dysuria.  Musculoskeletal: Negative.  Negative for back pain and myalgias.  Skin: Negative.  Negative for rash.  Neurological: Negative.  Negative for focal weakness, weakness and headaches.  Psychiatric/Behavioral: Negative.  The patient is not nervous/anxious.     As per HPI. Otherwise, a complete review of systems is negative.  PAST MEDICAL HISTORY: Past Medical History:  Diagnosis Date  . Allergy   . Breast cancer, right ([place]) 2020   Currently taking hormonal chemo tx's.   . Diabetes mellitus without complication ([place]) 0623  .  Hyperlipidemia   . Hypertension   . Osteoporosis   . Trigeminal neuralgia     PAST SURGICAL HISTORY: Past Surgical History:  Procedure Laterality Date  . ABDOMINAL HYSTERECTOMY  1978  . BREAST BIOPSY Right 01/06/2019   [place] bx x 2.  Ribbon marker for mass and hydro for Lymph node. Path pending  . BREAST CYST EXCISION Right 1986  . BREAST EXCISIONAL BIOPSY    . IR IMAGING GUIDED PORT INSERTION  05/12/2019  . LAMINECTOMY  1995   L1, L2, L3  . SPINE SURGERY      FAMILY HISTORY: Family History  Adopted: Yes  Family history unknown: Yes    ADVANCED DIRECTIVES (Y/N):  N  HEALTH MAINTENANCE: Social History   Tobacco Use  . Smoking status: Former Smoker    Quit date: 03/16/2000    Years since quitting: 19.5  . Smokeless tobacco: Never Used  Substance Use Topics  . Alcohol use: No  . Drug use: No     Colonoscopy:  PAP:  Bone density:  Lipid panel:  Allergies  Allergen Reactions  . Morphine     Anaphallaxis.    Current Outpatient Medications  Medication Sig Dispense Refill  . ALPRAZolam (XANAX) 1 MG tablet TAKE ONE TABLET BY MOUTH AT BEDTIME AS NEEDED FOR ANXIETY 90 tablet 1  . amLODipine (NORVASC) 5 MG tablet Take 1 tablet (5 mg total) by mouth daily. 90 tablet 3  . baclofen (LIORESAL) 10 MG tablet Take 1 tablet (10 mg total) by mouth daily. 90 tablet 3  . bisoprolol-hydrochlorothiazide (ZIAC) 10-6.25 MG tablet Take 1 tablet by mouth 2 (two) times daily. 180 tablet 3  . buPROPion (WELLBUTRIN XL) 150 MG 24  hr tablet Take 1 tablet (150 mg total) by mouth 2 (two) times a day. 180 tablet 1  . Cholecalciferol (VITAMIN D3) 5000 units CAPS Take by mouth.    . letrozole (FEMARA) 2.5 MG tablet Take 1 tablet (2.5 mg total) by mouth daily. 90 tablet 3  . lidocaine-prilocaine (EMLA) cream Apply to affected area once 30 g 3  . lisinopril-hydrochlorothiazide (ZESTORETIC) 10-12.5 MG tablet Take 1 tablet by mouth daily. 90 tablet 3  . loratadine (CLARITIN) 10 MG tablet Take 1 tablet  (10 mg total) by mouth daily as needed. 90 tablet 3  . meloxicam (MOBIC) 15 MG tablet Take 1 tablet (15 mg total) by mouth daily. 90 tablet 3  . metFORMIN (GLUCOPHAGE) 1000 MG tablet Take 1 tablet (1,000 mg total) by mouth 2 (two) times daily with a meal. 180 tablet 3  . MULTIPLE VITAMINS PO MULTIVITAMINS (Oral Tablet)  1 Every Day for 0 days  Quantity: 0.00;  Refills: 0   Ordered :17-Apr-2011  [name] ;  Started 24-February-2009 Active    . OMEGA 3-6-9 FATTY ACIDS PO Reported on 10/04/2015    . ondansetron (ZOFRAN) 8 MG tablet Take 1 tablet (8 mg total) by mouth 2 (two) times daily as needed. 30 tablet 2  . prochlorperazine (COMPAZINE) 10 MG tablet Take 1 tablet (10 mg total) by mouth every 6 (six) hours as needed (Nausea or vomiting). 60 tablet 2  . rizatriptan (MAXALT) 5 MG tablet TAKE ONE TABLET BY MOUTH AS NEEDED FOR MIGRAINE. MAY REPEAT IN 2 HOURS IF NEEDED. 10 tablet 11  . simvastatin (ZOCOR) 20 MG tablet Take 1 tablet (20 mg total) by mouth at bedtime. 90 tablet 3  . Vitamin D, Ergocalciferol, (DRISDOL) 1.25 MG (50000 UT) CAPS capsule Take 1 capsule (50,000 Units total) by mouth every 7 (seven) days. 12 capsule 4  . vitamin E 1000 UNIT capsule Take 1,000 Units by mouth daily.     No current facility-administered medications for this visit.   Facility-Administered Medications Ordered in Other Visits  Medication Dose Route Frequency Provider Last Rate Last Admin  . dexamethasone (DECADRON) injection 10 mg  10 mg Intravenous Once Lloyd Huger, MD      . diphenhydrAMINE (BENADRYL) injection 25 mg  25 mg Intravenous Once Lloyd Huger, MD      . famotidine (PEPCID) IVPB 20 mg premix  20 mg Intravenous Once Lloyd Huger, MD      . heparin lock flush 100 unit/mL  500 Units Intravenous Once Lloyd Huger, MD      . heparin lock flush 100 unit/mL  500 Units Intravenous Once Lloyd Huger, MD      . heparin lock flush 100 unit/mL  500 Units Intracatheter Once PRN  Lloyd Huger, MD      . PACLitaxel (TAXOL) 174 mg in sodium chloride 0.9 % 250 mL chemo infusion (</= 50m/m2)  80 mg/m2 (Treatment Plan Recorded) Intravenous Once FLloyd Huger MD      . sodium chloride flush (NS) 0.9 % injection 10 mL  10 mL Intravenous Once FLloyd Huger MD      Graduate for companion yesterday dog  OBJECTIVE: Vitals:   09/30/19 0836  BP: (!) 125/57  Pulse: (!) 58  Resp: 18  Temp: (!) 96.4 F (35.8 C)  SpO2: 98%     Body mass index is 34.01 kg/m.    ECOG FS:0 - Asymptomatic  General: Well-developed, well-nourished, no acute distress. Eyes: Pink conjunctiva, anicteric  sclera. HEENT: Normocephalic, moist mucous membranes. Lungs: No audible wheezing or coughing. Heart: Regular rate and rhythm. Abdomen: Soft, nontender, no obvious distention. Musculoskeletal: No edema, cyanosis, or clubbing. Neuro: Alert, answering all questions appropriately. Cranial nerves grossly intact. Skin: No rashes or petechiae noted. Psych: Normal affect.   LAB RESULTS:  Lab Results  Component Value Date   NA 139 09/30/2019   K 4.2 09/30/2019   CL 107 09/30/2019   CO2 24 09/30/2019   GLUCOSE 125 (H) 09/30/2019   BUN 21 09/30/2019   CREATININE 0.72 09/30/2019   CALCIUM 9.5 09/30/2019   PROT 6.2 (L) 09/30/2019   ALBUMIN 3.8 09/30/2019   AST 15 09/30/2019   ALT 13 09/30/2019   ALKPHOS 52 09/30/2019   BILITOT 0.4 09/30/2019   GFRNONAA >60 09/30/2019   GFRAA >60 09/30/2019    Lab Results  Component Value Date   WBC 4.3 09/30/2019   NEUTROABS 2.4 09/30/2019   HGB 10.3 (L) 09/30/2019   HCT 32.9 (L) 09/30/2019   MCV 89.4 09/30/2019   PLT 247 09/30/2019     STUDIES: No results found.  ASSESSMENT: Clinical stage IIa ER/PR positive, HER-2 negative invasive carcinoma of the central portion of the right breast.  Oncotype DX score 7.  PLAN:   1. Clinical stage IIa ER/PR positive, HER-2 negative invasive carcinoma of the central portion of the right  breast: Although patient is clinical stage IIa and typically neoadjuvant chemotherapy is the recommendation, patient initially did not wish to pursue aggressive immunosuppressive treatment during the COVID-19 pandemic.  Instead, patient received neoadjuvant letrozole from April 2020 through August 2020.  She then proceeded with neoadjuvant chemotherapy using Adriamycin, Cytoxan with Udenyca support followed by weekly Taxol x12.  Patient completed Adriamycin and Cytoxan on June 24, 2019.  Recent MRI results noted.  Repeat MUGA on June 09, 2019 revealed an ejection fraction of 52.2% which is unchanged from prior.  Proceed with cycle 12 of 12 of weekly Taxol today.  Patient has an appointment with surgery tomorrow for treatment planning.  Patient will return to clinic approximately 2 weeks after her bilateral mastectomy for further evaluation and discussion of her final pathology results.   2.  COVID positivity: Resolved.  Patient reports repeat testing is negative. It is likely that the groundglass densities seen on her CT scan from March 17, 2019 are residual from her infection.  No intervention is needed at this time. 3.  Thyroid nodule: Biopsy reported as benign.  Likely multinodular goiter. 4.  Left breast nodule: 7 mm lesion noted on MRI, monitor.  Patient reports she is likely going to pursue bilateral mastectomy. 5.  Anemia: Hemoglobin has trended down slightly to 10.3, monitor. 6.  Neutropenia: Resolved.  Patient expressed understanding and was in agreement with this plan. She also understands that She can call clinic at any time with any questions, concerns, or complaints.   Cancer Staging Cancer of central portion of right female breast [facility]) Staging form: Breast, AJCC 8th Edition - Clinical stage from 01/13/2019: Stage IIA (cT2, cN1, cM0, G1, ER+, PR+, HER2-) - Signed by Lloyd Huger, MD on 01/13/2019   Lloyd Huger, MD   09/30/2019 9:43 AM

## 2019-09-29 ENCOUNTER — Encounter: Payer: Self-pay | Admitting: Oncology

## 2019-09-29 NOTE — Progress Notes (Signed)
Patient prescreened for appointment. Patient has no concerns or questions.  

## 2019-09-30 ENCOUNTER — Other Ambulatory Visit: Payer: Self-pay

## 2019-09-30 ENCOUNTER — Inpatient Hospital Stay: Payer: Medicare PPO

## 2019-09-30 ENCOUNTER — Inpatient Hospital Stay (HOSPITAL_BASED_OUTPATIENT_CLINIC_OR_DEPARTMENT_OTHER): Payer: Medicare PPO | Admitting: Oncology

## 2019-09-30 VITALS — BP 125/57 | HR 58 | Temp 96.4°F | Resp 18 | Wt 204.4 lb

## 2019-09-30 DIAGNOSIS — Z170 Estrogen receptor positive status [ER+]: Secondary | ICD-10-CM | POA: Diagnosis not present

## 2019-09-30 DIAGNOSIS — C50111 Malignant neoplasm of central portion of right female breast: Secondary | ICD-10-CM

## 2019-09-30 DIAGNOSIS — Z5111 Encounter for antineoplastic chemotherapy: Secondary | ICD-10-CM | POA: Diagnosis not present

## 2019-09-30 LAB — CBC WITH DIFFERENTIAL/PLATELET
Abs Immature Granulocytes: 0.03 10*3/uL (ref 0.00–0.07)
Basophils Absolute: 0 10*3/uL (ref 0.0–0.1)
Basophils Relative: 1 %
Eosinophils Absolute: 0.3 10*3/uL (ref 0.0–0.5)
Eosinophils Relative: 7 %
HCT: 32.9 % — ABNORMAL LOW (ref 36.0–46.0)
Hemoglobin: 10.3 g/dL — ABNORMAL LOW (ref 12.0–15.0)
Immature Granulocytes: 1 %
Lymphocytes Relative: 32 %
Lymphs Abs: 1.3 10*3/uL (ref 0.7–4.0)
MCH: 28 pg (ref 26.0–34.0)
MCHC: 31.3 g/dL (ref 30.0–36.0)
MCV: 89.4 fL (ref 80.0–100.0)
Monocytes Absolute: 0.2 10*3/uL (ref 0.1–1.0)
Monocytes Relative: 5 %
Neutro Abs: 2.4 10*3/uL (ref 1.7–7.7)
Neutrophils Relative %: 54 %
Platelets: 247 10*3/uL (ref 150–400)
RBC: 3.68 MIL/uL — ABNORMAL LOW (ref 3.87–5.11)
RDW: 14.7 % (ref 11.5–15.5)
WBC: 4.3 10*3/uL (ref 4.0–10.5)
nRBC: 0 % (ref 0.0–0.2)

## 2019-09-30 LAB — COMPREHENSIVE METABOLIC PANEL
ALT: 13 U/L (ref 0–44)
AST: 15 U/L (ref 15–41)
Albumin: 3.8 g/dL (ref 3.5–5.0)
Alkaline Phosphatase: 52 U/L (ref 38–126)
Anion gap: 8 (ref 5–15)
BUN: 21 mg/dL (ref 8–23)
CO2: 24 mmol/L (ref 22–32)
Calcium: 9.5 mg/dL (ref 8.9–10.3)
Chloride: 107 mmol/L (ref 98–111)
Creatinine, Ser: 0.72 mg/dL (ref 0.44–1.00)
GFR calc Af Amer: >60 mL/min (ref >60)
GFR calc non Af Amer: >60 mL/min (ref >60)
Glucose, Bld: 125 mg/dL — ABNORMAL HIGH (ref 70–99)
Potassium: 4.2 mmol/L (ref 3.5–5.1)
Sodium: 139 mmol/L (ref 135–145)
Total Bilirubin: 0.4 mg/dL (ref 0.3–1.2)
Total Protein: 6.2 g/dL — ABNORMAL LOW (ref 6.5–8.1)

## 2019-09-30 MED ORDER — HEPARIN SOD (PORK) LOCK FLUSH 100 UNIT/ML IV SOLN
INTRAVENOUS | Status: AC
  Filled 2019-09-30: qty 5

## 2019-09-30 MED ORDER — HEPARIN SOD (PORK) LOCK FLUSH 100 UNIT/ML IV SOLN
500.0000 [IU] | Freq: Once | INTRAVENOUS | Status: AC
  Administered 2019-09-30: 12:00:00 500 [IU] via INTRAVENOUS
  Filled 2019-09-30: qty 5

## 2019-09-30 MED ORDER — SODIUM CHLORIDE 0.9% FLUSH
10.0000 mL | Freq: Once | INTRAVENOUS | Status: AC
  Administered 2019-09-30: 10 mL via INTRAVENOUS
  Filled 2019-09-30: qty 10

## 2019-09-30 MED ORDER — SODIUM CHLORIDE 0.9 % IV SOLN
80.0000 mg/m2 | Freq: Once | INTRAVENOUS | Status: AC
  Administered 2019-09-30: 174 mg via INTRAVENOUS
  Filled 2019-09-30: qty 29

## 2019-09-30 MED ORDER — DIPHENHYDRAMINE HCL 50 MG/ML IJ SOLN
25.0000 mg | Freq: Once | INTRAMUSCULAR | Status: AC
  Administered 2019-09-30: 25 mg via INTRAVENOUS
  Filled 2019-09-30: qty 1

## 2019-09-30 MED ORDER — HEPARIN SOD (PORK) LOCK FLUSH 100 UNIT/ML IV SOLN
500.0000 [IU] | Freq: Once | INTRAVENOUS | Status: DC | PRN
  Filled 2019-09-30: qty 5

## 2019-09-30 MED ORDER — SODIUM CHLORIDE 0.9 % IV SOLN
Freq: Once | INTRAVENOUS | Status: AC
  Filled 2019-09-30: qty 250

## 2019-09-30 MED ORDER — FAMOTIDINE IN NACL 20-0.9 MG/50ML-% IV SOLN
20.0000 mg | Freq: Once | INTRAVENOUS | Status: AC
  Administered 2019-09-30: 20 mg via INTRAVENOUS
  Filled 2019-09-30: qty 50

## 2019-09-30 MED ORDER — DEXAMETHASONE SODIUM PHOSPHATE 10 MG/ML IJ SOLN
10.0000 mg | Freq: Once | INTRAMUSCULAR | Status: AC
  Administered 2019-09-30: 10 mg via INTRAVENOUS
  Filled 2019-09-30: qty 1

## 2019-10-01 ENCOUNTER — Ambulatory Visit: Payer: Self-pay | Admitting: General Surgery

## 2019-10-01 DIAGNOSIS — C50111 Malignant neoplasm of central portion of right female breast: Secondary | ICD-10-CM

## 2019-10-01 DIAGNOSIS — Z170 Estrogen receptor positive status [ER+]: Secondary | ICD-10-CM

## 2019-10-07 ENCOUNTER — Other Ambulatory Visit: Payer: Self-pay | Admitting: General Surgery

## 2019-10-07 DIAGNOSIS — C50411 Malignant neoplasm of upper-outer quadrant of right female breast: Secondary | ICD-10-CM

## 2019-10-07 DIAGNOSIS — C50111 Malignant neoplasm of central portion of right female breast: Secondary | ICD-10-CM

## 2019-10-07 DIAGNOSIS — Z170 Estrogen receptor positive status [ER+]: Secondary | ICD-10-CM

## 2019-10-16 ENCOUNTER — Other Ambulatory Visit (HOSPITAL_COMMUNITY): Payer: Medicare PPO

## 2019-10-27 ENCOUNTER — Ambulatory Visit
Admission: RE | Admit: 2019-10-27 | Discharge: 2019-10-27 | Disposition: A | Discharge status: Home / Self Care | Payer: Medicare PPO | Source: Ambulatory Visit | Attending: General Surgery | Admitting: General Surgery

## 2019-10-27 ENCOUNTER — Other Ambulatory Visit: Payer: Self-pay

## 2019-10-27 DIAGNOSIS — C50411 Malignant neoplasm of upper-outer quadrant of right female breast: Secondary | ICD-10-CM | POA: Diagnosis present

## 2019-10-27 MED ORDER — GADOBUTROL 1 MMOL/ML IV SOLN
9.0000 mL | Freq: Once | INTRAVENOUS | Status: AC | PRN
  Administered 2019-10-27: 9 mL via INTRAVENOUS

## 2019-10-27 NOTE — Progress Notes (Signed)
Port access consult: Arrived to MRI dept and PIV site had already been established.

## 2019-11-10 ENCOUNTER — Encounter (HOSPITAL_BASED_OUTPATIENT_CLINIC_OR_DEPARTMENT_OTHER): Payer: Self-pay | Admitting: General Surgery

## 2019-11-10 ENCOUNTER — Other Ambulatory Visit: Payer: Self-pay

## 2019-11-11 ENCOUNTER — Encounter (HOSPITAL_BASED_OUTPATIENT_CLINIC_OR_DEPARTMENT_OTHER)
Admission: RE | Admit: 2019-11-11 | Discharge: 2019-11-11 | Disposition: A | Discharge status: Home / Self Care | Payer: Medicare PPO | Source: Ambulatory Visit | Attending: General Surgery | Admitting: General Surgery

## 2019-11-11 DIAGNOSIS — M1990 Unspecified osteoarthritis, unspecified site: Secondary | ICD-10-CM | POA: Diagnosis not present

## 2019-11-11 DIAGNOSIS — F329 Major depressive disorder, single episode, unspecified: Secondary | ICD-10-CM | POA: Diagnosis not present

## 2019-11-11 DIAGNOSIS — C773 Secondary and unspecified malignant neoplasm of axilla and upper limb lymph nodes: Secondary | ICD-10-CM | POA: Diagnosis not present

## 2019-11-11 DIAGNOSIS — Z7984 Long term (current) use of oral hypoglycemic drugs: Secondary | ICD-10-CM | POA: Diagnosis not present

## 2019-11-11 DIAGNOSIS — Z170 Estrogen receptor positive status [ER+]: Secondary | ICD-10-CM | POA: Diagnosis not present

## 2019-11-11 DIAGNOSIS — K219 Gastro-esophageal reflux disease without esophagitis: Secondary | ICD-10-CM | POA: Diagnosis not present

## 2019-11-11 DIAGNOSIS — Z885 Allergy status to narcotic agent status: Secondary | ICD-10-CM | POA: Diagnosis not present

## 2019-11-11 DIAGNOSIS — Z791 Long term (current) use of non-steroidal anti-inflammatories (NSAID): Secondary | ICD-10-CM | POA: Diagnosis not present

## 2019-11-11 DIAGNOSIS — I10 Essential (primary) hypertension: Secondary | ICD-10-CM | POA: Diagnosis not present

## 2019-11-11 DIAGNOSIS — Z79899 Other long term (current) drug therapy: Secondary | ICD-10-CM | POA: Diagnosis not present

## 2019-11-11 DIAGNOSIS — Z87891 Personal history of nicotine dependence: Secondary | ICD-10-CM | POA: Diagnosis not present

## 2019-11-11 DIAGNOSIS — E119 Type 2 diabetes mellitus without complications: Secondary | ICD-10-CM | POA: Diagnosis not present

## 2019-11-11 DIAGNOSIS — C50411 Malignant neoplasm of upper-outer quadrant of right female breast: Secondary | ICD-10-CM | POA: Diagnosis not present

## 2019-11-11 DIAGNOSIS — Z886 Allergy status to analgesic agent status: Secondary | ICD-10-CM | POA: Diagnosis not present

## 2019-11-11 DIAGNOSIS — C50911 Malignant neoplasm of unspecified site of right female breast: Secondary | ICD-10-CM | POA: Diagnosis present

## 2019-11-11 DIAGNOSIS — R519 Headache, unspecified: Secondary | ICD-10-CM | POA: Diagnosis not present

## 2019-11-11 DIAGNOSIS — F419 Anxiety disorder, unspecified: Secondary | ICD-10-CM | POA: Diagnosis not present

## 2019-11-11 DIAGNOSIS — E7800 Pure hypercholesterolemia, unspecified: Secondary | ICD-10-CM | POA: Diagnosis not present

## 2019-11-11 DIAGNOSIS — Z90710 Acquired absence of both cervix and uterus: Secondary | ICD-10-CM | POA: Diagnosis not present

## 2019-11-11 LAB — BASIC METABOLIC PANEL
Anion gap: 9 (ref 5–15)
BUN: 24 mg/dL — ABNORMAL HIGH (ref 8–23)
CO2: 25 mmol/L (ref 22–32)
Calcium: 9.6 mg/dL (ref 8.9–10.3)
Chloride: 104 mmol/L (ref 98–111)
Creatinine, Ser: 0.78 mg/dL (ref 0.44–1.00)
GFR calc Af Amer: >60 mL/min (ref >60)
GFR calc non Af Amer: >60 mL/min (ref >60)
Glucose, Bld: 131 mg/dL — ABNORMAL HIGH (ref 70–99)
Potassium: 4.4 mmol/L (ref 3.5–5.1)
Sodium: 138 mmol/L (ref 135–145)

## 2019-11-11 NOTE — Progress Notes (Signed)

## 2019-11-13 ENCOUNTER — Other Ambulatory Visit (HOSPITAL_COMMUNITY)
Admission: RE | Admit: 2019-11-13 | Discharge: 2019-11-13 | Disposition: A | Discharge status: Home / Self Care | Payer: Medicare PPO | Source: Ambulatory Visit | Attending: General Surgery | Admitting: General Surgery

## 2019-11-13 DIAGNOSIS — Z01812 Encounter for preprocedural laboratory examination: Secondary | ICD-10-CM | POA: Diagnosis present

## 2019-11-13 DIAGNOSIS — Z20822 Contact with and (suspected) exposure to covid-19: Secondary | ICD-10-CM | POA: Diagnosis not present

## 2019-11-13 LAB — SARS CORONAVIRUS 2 (TAT 6-24 HRS): SARS Coronavirus 2: NEGATIVE

## 2019-11-16 ENCOUNTER — Other Ambulatory Visit: Payer: Self-pay

## 2019-11-16 ENCOUNTER — Ambulatory Visit
Admission: RE | Admit: 2019-11-16 | Discharge: 2019-11-16 | Disposition: A | Discharge status: Home / Self Care | Payer: Medicare PPO | Source: Ambulatory Visit | Attending: General Surgery | Admitting: General Surgery

## 2019-11-16 ENCOUNTER — Other Ambulatory Visit: Payer: Self-pay | Admitting: General Surgery

## 2019-11-16 DIAGNOSIS — C50111 Malignant neoplasm of central portion of right female breast: Secondary | ICD-10-CM

## 2019-11-16 DIAGNOSIS — Z170 Estrogen receptor positive status [ER+]: Secondary | ICD-10-CM

## 2019-11-17 ENCOUNTER — Encounter (HOSPITAL_BASED_OUTPATIENT_CLINIC_OR_DEPARTMENT_OTHER): Payer: Self-pay | Admitting: General Surgery

## 2019-11-17 ENCOUNTER — Ambulatory Visit (HOSPITAL_BASED_OUTPATIENT_CLINIC_OR_DEPARTMENT_OTHER)
Admission: RE | Admit: 2019-11-17 | Discharge: 2019-11-18 | Disposition: A | Discharge status: Home / Self Care | Payer: Medicare PPO | Attending: General Surgery | Admitting: General Surgery

## 2019-11-17 ENCOUNTER — Ambulatory Visit
Admission: RE | Admit: 2019-11-17 | Discharge: 2019-11-17 | Disposition: A | Discharge status: Home / Self Care | Payer: Medicare PPO | Source: Ambulatory Visit | Attending: General Surgery | Admitting: General Surgery

## 2019-11-17 ENCOUNTER — Ambulatory Visit (HOSPITAL_BASED_OUTPATIENT_CLINIC_OR_DEPARTMENT_OTHER): Payer: Medicare PPO | Admitting: Anesthesiology

## 2019-11-17 ENCOUNTER — Other Ambulatory Visit: Payer: Self-pay

## 2019-11-17 ENCOUNTER — Encounter (HOSPITAL_BASED_OUTPATIENT_CLINIC_OR_DEPARTMENT_OTHER)
Admission: RE | Disposition: A | Discharge status: Home / Self Care | Payer: Self-pay | Source: Home / Self Care | Attending: General Surgery

## 2019-11-17 ENCOUNTER — Ambulatory Visit (HOSPITAL_COMMUNITY)
Admission: RE | Admit: 2019-11-17 | Discharge: 2019-11-17 | Disposition: A | Discharge status: Home / Self Care | Payer: Medicare PPO | Source: Ambulatory Visit | Attending: General Surgery | Admitting: General Surgery

## 2019-11-17 DIAGNOSIS — Z170 Estrogen receptor positive status [ER+]: Secondary | ICD-10-CM | POA: Insufficient documentation

## 2019-11-17 DIAGNOSIS — Z87891 Personal history of nicotine dependence: Secondary | ICD-10-CM | POA: Insufficient documentation

## 2019-11-17 DIAGNOSIS — E119 Type 2 diabetes mellitus without complications: Secondary | ICD-10-CM | POA: Insufficient documentation

## 2019-11-17 DIAGNOSIS — R519 Headache, unspecified: Secondary | ICD-10-CM | POA: Insufficient documentation

## 2019-11-17 DIAGNOSIS — Z7984 Long term (current) use of oral hypoglycemic drugs: Secondary | ICD-10-CM | POA: Insufficient documentation

## 2019-11-17 DIAGNOSIS — C50111 Malignant neoplasm of central portion of right female breast: Secondary | ICD-10-CM

## 2019-11-17 DIAGNOSIS — C773 Secondary and unspecified malignant neoplasm of axilla and upper limb lymph nodes: Secondary | ICD-10-CM | POA: Insufficient documentation

## 2019-11-17 DIAGNOSIS — M1990 Unspecified osteoarthritis, unspecified site: Secondary | ICD-10-CM | POA: Insufficient documentation

## 2019-11-17 DIAGNOSIS — C50411 Malignant neoplasm of upper-outer quadrant of right female breast: Secondary | ICD-10-CM | POA: Insufficient documentation

## 2019-11-17 DIAGNOSIS — Z90710 Acquired absence of both cervix and uterus: Secondary | ICD-10-CM | POA: Diagnosis not present

## 2019-11-17 DIAGNOSIS — C50911 Malignant neoplasm of unspecified site of right female breast: Secondary | ICD-10-CM | POA: Diagnosis present

## 2019-11-17 DIAGNOSIS — E7800 Pure hypercholesterolemia, unspecified: Secondary | ICD-10-CM | POA: Insufficient documentation

## 2019-11-17 DIAGNOSIS — Z791 Long term (current) use of non-steroidal anti-inflammatories (NSAID): Secondary | ICD-10-CM | POA: Insufficient documentation

## 2019-11-17 DIAGNOSIS — Z886 Allergy status to analgesic agent status: Secondary | ICD-10-CM | POA: Insufficient documentation

## 2019-11-17 DIAGNOSIS — F329 Major depressive disorder, single episode, unspecified: Secondary | ICD-10-CM | POA: Insufficient documentation

## 2019-11-17 DIAGNOSIS — Z79899 Other long term (current) drug therapy: Secondary | ICD-10-CM | POA: Insufficient documentation

## 2019-11-17 DIAGNOSIS — K219 Gastro-esophageal reflux disease without esophagitis: Secondary | ICD-10-CM | POA: Insufficient documentation

## 2019-11-17 DIAGNOSIS — Z885 Allergy status to narcotic agent status: Secondary | ICD-10-CM | POA: Insufficient documentation

## 2019-11-17 DIAGNOSIS — I10 Essential (primary) hypertension: Secondary | ICD-10-CM | POA: Insufficient documentation

## 2019-11-17 DIAGNOSIS — F419 Anxiety disorder, unspecified: Secondary | ICD-10-CM | POA: Insufficient documentation

## 2019-11-17 HISTORY — PX: MASTECTOMY WITH AXILLARY LYMPH NODE DISSECTION: SHX5661

## 2019-11-17 HISTORY — PX: SENTINEL NODE BIOPSY: SHX6608

## 2019-11-17 HISTORY — DX: Depression, unspecified: F32.A

## 2019-11-17 HISTORY — DX: Dyspnea, unspecified: R06.00

## 2019-11-17 LAB — GLUCOSE, CAPILLARY
Glucose-Capillary: 114 mg/dL — ABNORMAL HIGH (ref 70–99)
Glucose-Capillary: 142 mg/dL — ABNORMAL HIGH (ref 70–99)
Glucose-Capillary: 122 mg/dL — ABNORMAL HIGH (ref 70–99)

## 2019-11-17 SURGERY — MASTECTOMY WITH AXILLARY LYMPH NODE DISSECTION
Anesthesia: General | Site: Breast | Laterality: Right

## 2019-11-17 MED ORDER — BACLOFEN 10 MG PO TABS: 10.0000 mg | ORAL_TABLET | Freq: Every day | ORAL | Status: DC

## 2019-11-17 MED ORDER — PHENYLEPHRINE 40 MCG/ML (10ML) SYRINGE FOR IV PUSH (FOR BLOOD PRESSURE SUPPORT)
PREFILLED_SYRINGE | INTRAVENOUS | Status: AC
  Filled 2019-11-17: qty 10

## 2019-11-17 MED ORDER — ONDANSETRON 4 MG PO TBDP: 4.0000 mg | ORAL_TABLET | Freq: Four times a day (QID) | ORAL | Status: DC | PRN

## 2019-11-17 MED ORDER — GABAPENTIN 300 MG PO CAPS
ORAL_CAPSULE | ORAL | Status: AC
  Filled 2019-11-17: qty 1

## 2019-11-17 MED ORDER — SCOPOLAMINE 1 MG/3DAYS TD PT72: 1.0000 | MEDICATED_PATCH | TRANSDERMAL | Status: DC

## 2019-11-17 MED ORDER — SODIUM CHLORIDE 0.9 % IV SOLN: INTRAVENOUS | Status: DC

## 2019-11-17 MED ORDER — TECHNETIUM TC 99M SULFUR COLLOID FILTERED
1.0000 | Freq: Once | INTRAVENOUS | Status: AC | PRN
  Administered 2019-11-17: 1 via INTRADERMAL

## 2019-11-17 MED ORDER — HYDROCODONE-ACETAMINOPHEN 5-325 MG PO TABS: 1.0000 | ORAL_TABLET | Freq: Four times a day (QID) | ORAL | 0 refills | Status: DC | PRN

## 2019-11-17 MED ORDER — GLYCOPYRROLATE 0.2 MG/ML IJ SOLN
INTRAMUSCULAR | Status: DC | PRN
  Administered 2019-11-17: .2 mg via INTRAVENOUS

## 2019-11-17 MED ORDER — BUPIVACAINE LIPOSOME 1.3 % IJ SUSP
INTRAMUSCULAR | Status: DC | PRN
  Administered 2019-11-17 (×2): 10 mL

## 2019-11-17 MED ORDER — ONDANSETRON HCL 4 MG/2ML IJ SOLN: 4.0000 mg | Freq: Four times a day (QID) | INTRAMUSCULAR | Status: DC | PRN

## 2019-11-17 MED ORDER — MIDAZOLAM HCL 2 MG/2ML IJ SOLN
INTRAMUSCULAR | Status: AC
  Filled 2019-11-17: qty 2

## 2019-11-17 MED ORDER — ALPRAZOLAM 1 MG PO TABS: 1.0000 mg | ORAL_TABLET | Freq: Every evening | ORAL | Status: DC | PRN

## 2019-11-17 MED ORDER — LIDOCAINE 2% (20 MG/ML) 5 ML SYRINGE
INTRAMUSCULAR | Status: AC
  Filled 2019-11-17: qty 5

## 2019-11-17 MED ORDER — LACTATED RINGERS IV SOLN: INTRAVENOUS | Status: DC

## 2019-11-17 MED ORDER — CEFAZOLIN SODIUM-DEXTROSE 2-4 GM/100ML-% IV SOLN
INTRAVENOUS | Status: AC
  Filled 2019-11-17: qty 100

## 2019-11-17 MED ORDER — DEXAMETHASONE SODIUM PHOSPHATE 4 MG/ML IJ SOLN
INTRAMUSCULAR | Status: DC | PRN
  Administered 2019-11-17: 5 mg via INTRAVENOUS

## 2019-11-17 MED ORDER — FENTANYL CITRATE (PF) 100 MCG/2ML IJ SOLN
INTRAMUSCULAR | Status: DC | PRN
  Administered 2019-11-17 (×4): 25 ug via INTRAVENOUS

## 2019-11-17 MED ORDER — ACETAMINOPHEN 500 MG PO TABS
1000.0000 mg | ORAL_TABLET | ORAL | Status: AC
  Administered 2019-11-17: 11:00:00 1000 mg via ORAL

## 2019-11-17 MED ORDER — PROPOFOL 10 MG/ML IV BOLUS
INTRAVENOUS | Status: DC | PRN
  Administered 2019-11-17: 200 mg via INTRAVENOUS

## 2019-11-17 MED ORDER — METFORMIN HCL 500 MG PO TABS
1000.0000 mg | ORAL_TABLET | Freq: Two times a day (BID) | ORAL | Status: DC
  Administered 2019-11-17: 1000 mg via ORAL
  Filled 2019-11-17 (×2): qty 2

## 2019-11-17 MED ORDER — LIDOCAINE 2% (20 MG/ML) 5 ML SYRINGE
INTRAMUSCULAR | Status: DC | PRN
  Administered 2019-11-17: 60 mg via INTRAVENOUS

## 2019-11-17 MED ORDER — EPHEDRINE SULFATE-NACL 50-0.9 MG/10ML-% IV SOSY
PREFILLED_SYRINGE | INTRAVENOUS | Status: DC | PRN
  Administered 2019-11-17 (×2): 10 mg via INTRAVENOUS
  Administered 2019-11-17: 15 mg via INTRAVENOUS
  Administered 2019-11-17: 10 mg via INTRAVENOUS
  Administered 2019-11-17: 5 mg via INTRAVENOUS
  Administered 2019-11-17 (×3): 10 mg via INTRAVENOUS
  Administered 2019-11-17 (×2): 5 mg via INTRAVENOUS

## 2019-11-17 MED ORDER — BISOPROLOL-HYDROCHLOROTHIAZIDE 10-6.25 MG PO TABS: 1.0000 | ORAL_TABLET | Freq: Two times a day (BID) | ORAL | Status: DC

## 2019-11-17 MED ORDER — HYDROCODONE-ACETAMINOPHEN 5-325 MG PO TABS: 1.0000 | ORAL_TABLET | ORAL | Status: DC | PRN

## 2019-11-17 MED ORDER — PANTOPRAZOLE SODIUM 40 MG IV SOLR: 40.0000 mg | Freq: Every day | INTRAVENOUS | Status: DC

## 2019-11-17 MED ORDER — EPHEDRINE 5 MG/ML INJ
INTRAVENOUS | Status: AC
  Filled 2019-11-17: qty 10

## 2019-11-17 MED ORDER — ONDANSETRON HCL 4 MG/2ML IJ SOLN
INTRAMUSCULAR | Status: DC | PRN
  Administered 2019-11-17: 4 mg via INTRAVENOUS

## 2019-11-17 MED ORDER — CHLORHEXIDINE GLUCONATE CLOTH 2 % EX PADS: 6.0000 | MEDICATED_PAD | Freq: Once | CUTANEOUS | Status: DC

## 2019-11-17 MED ORDER — SIMVASTATIN 20 MG PO TABS: 20.0000 mg | ORAL_TABLET | Freq: Every day | ORAL | Status: DC

## 2019-11-17 MED ORDER — PROMETHAZINE HCL 25 MG/ML IJ SOLN: 6.2500 mg | INTRAMUSCULAR | Status: DC | PRN

## 2019-11-17 MED ORDER — BUPROPION HCL ER (XL) 150 MG PO TB24: 150.0000 mg | ORAL_TABLET | Freq: Every day | ORAL | Status: DC

## 2019-11-17 MED ORDER — LISINOPRIL-HYDROCHLOROTHIAZIDE 10-12.5 MG PO TABS: 1.0000 | ORAL_TABLET | Freq: Every day | ORAL | Status: DC

## 2019-11-17 MED ORDER — FENTANYL CITRATE (PF) 100 MCG/2ML IJ SOLN
INTRAMUSCULAR | Status: AC
  Filled 2019-11-17: qty 2

## 2019-11-17 MED ORDER — METHOCARBAMOL 500 MG PO TABS: 500.0000 mg | ORAL_TABLET | Freq: Four times a day (QID) | ORAL | Status: DC | PRN

## 2019-11-17 MED ORDER — AMLODIPINE BESYLATE 5 MG PO TABS: 5.0000 mg | ORAL_TABLET | Freq: Every day | ORAL | Status: DC

## 2019-11-17 MED ORDER — FENTANYL CITRATE (PF) 100 MCG/2ML IJ SOLN: 25.0000 ug | INTRAMUSCULAR | Status: DC | PRN

## 2019-11-17 MED ORDER — GLYCOPYRROLATE PF 0.2 MG/ML IJ SOSY
PREFILLED_SYRINGE | INTRAMUSCULAR | Status: AC
  Filled 2019-11-17: qty 1

## 2019-11-17 MED ORDER — TRAMADOL HCL 50 MG PO TABS: 50.0000 mg | ORAL_TABLET | Freq: Four times a day (QID) | ORAL | 1 refills | Status: DC | PRN

## 2019-11-17 MED ORDER — SODIUM CHLORIDE (PF) 0.9 % IJ SOLN
INTRAVENOUS | Status: DC | PRN
  Administered 2019-11-17: 5 mL via INTRAMUSCULAR

## 2019-11-17 MED ORDER — ACETAMINOPHEN 500 MG PO TABS
ORAL_TABLET | ORAL | Status: AC
  Filled 2019-11-17: qty 2

## 2019-11-17 MED ORDER — CELECOXIB 200 MG PO CAPS
ORAL_CAPSULE | ORAL | Status: AC
  Filled 2019-11-17: qty 1

## 2019-11-17 MED ORDER — ONDANSETRON HCL 4 MG/2ML IJ SOLN
INTRAMUSCULAR | Status: AC
  Filled 2019-11-17: qty 2

## 2019-11-17 MED ORDER — GABAPENTIN 300 MG PO CAPS
300.0000 mg | ORAL_CAPSULE | ORAL | Status: AC
  Administered 2019-11-17: 300 mg via ORAL

## 2019-11-17 MED ORDER — MIDAZOLAM HCL 2 MG/2ML IJ SOLN
1.0000 mg | INTRAMUSCULAR | Status: DC | PRN
  Administered 2019-11-17: 2 mg via INTRAVENOUS

## 2019-11-17 MED ORDER — FENTANYL CITRATE (PF) 100 MCG/2ML IJ SOLN
50.0000 ug | INTRAMUSCULAR | Status: DC | PRN
  Administered 2019-11-17: 100 ug via INTRAVENOUS

## 2019-11-17 MED ORDER — BUPIVACAINE HCL (PF) 0.25 % IJ SOLN
INTRAMUSCULAR | Status: DC | PRN
  Administered 2019-11-17 (×2): 15 mL via EPIDURAL

## 2019-11-17 MED ORDER — CELECOXIB 200 MG PO CAPS
200.0000 mg | ORAL_CAPSULE | ORAL | Status: AC
  Administered 2019-11-17: 200 mg via ORAL

## 2019-11-17 MED ORDER — CEFAZOLIN SODIUM-DEXTROSE 2-4 GM/100ML-% IV SOLN
2.0000 g | INTRAVENOUS | Status: AC
  Administered 2019-11-17: 2 g via INTRAVENOUS

## 2019-11-17 SURGICAL SUPPLY — 54 items
APPLIER CLIP 9.375 MED OPEN (MISCELLANEOUS) ×6
BINDER BREAST LRG (GAUZE/BANDAGES/DRESSINGS) IMPLANT
BINDER BREAST XLRG (GAUZE/BANDAGES/DRESSINGS) ×3 IMPLANT
BINDER BREAST XXLRG (GAUZE/BANDAGES/DRESSINGS) IMPLANT
BIOPATCH RED 1 DISK 7.0 (GAUZE/BANDAGES/DRESSINGS) ×6 IMPLANT
BLADE SURG 10 STRL SS (BLADE) ×6 IMPLANT
BLADE SURG 15 STRL LF DISP TIS (BLADE) ×2 IMPLANT
BLADE SURG 15 STRL SS (BLADE) ×1
CANISTER SUCT 1200ML W/VALVE (MISCELLANEOUS) ×6 IMPLANT
CHLORAPREP W/TINT 26 (MISCELLANEOUS) ×6 IMPLANT
CLIP APPLIE 9.375 MED OPEN (MISCELLANEOUS) ×4 IMPLANT
COVER BACK TABLE 60X90IN (DRAPES) ×3 IMPLANT
COVER MAYO STAND STRL (DRAPES) ×3 IMPLANT
COVER PROBE W GEL 5X96 (DRAPES) ×3 IMPLANT
COVER WAND RF STERILE (DRAPES) IMPLANT
DECANTER SPIKE VIAL GLASS SM (MISCELLANEOUS) IMPLANT
DERMABOND ADVANCED (GAUZE/BANDAGES/DRESSINGS) ×4
DERMABOND ADVANCED .7 DNX12 (GAUZE/BANDAGES/DRESSINGS) ×8 IMPLANT
DEVICE DISSECT PLASMABLAD 3.0S (MISCELLANEOUS) ×2 IMPLANT
DRAIN CHANNEL 19F RND (DRAIN) ×6 IMPLANT
DRAPE LAPAROSCOPIC ABDOMINAL (DRAPES) ×3 IMPLANT
DRAPE UTILITY XL STRL (DRAPES) ×3 IMPLANT
DRSG PAD ABDOMINAL 8X10 ST (GAUZE/BANDAGES/DRESSINGS) ×6 IMPLANT
ELECT COATED BLADE 2.86 ST (ELECTRODE) IMPLANT
ELECT REM PT RETURN 9FT ADLT (ELECTROSURGICAL) ×3
ELECTRODE REM PT RTRN 9FT ADLT (ELECTROSURGICAL) ×2 IMPLANT
EVACUATOR SILICONE 100CC (DRAIN) ×6 IMPLANT
GAUZE SPONGE 4X4 12PLY STRL LF (GAUZE/BANDAGES/DRESSINGS) ×3 IMPLANT
GLOVE BIO SURGEON STRL SZ7.5 (GLOVE) ×3 IMPLANT
GLOVE BIOGEL PI IND STRL 7.0 (GLOVE) ×4 IMPLANT
GLOVE BIOGEL PI INDICATOR 7.0 (GLOVE) ×2
GLOVE SURG SS PI 6.5 STRL IVOR (GLOVE) ×3 IMPLANT
GOWN STRL REUS W/ TWL LRG LVL3 (GOWN DISPOSABLE) ×6 IMPLANT
GOWN STRL REUS W/TWL LRG LVL3 (GOWN DISPOSABLE) ×3
ILLUMINATOR WAVEGUIDE N/F (MISCELLANEOUS) IMPLANT
LIGHT WAVEGUIDE WIDE FLAT (MISCELLANEOUS) IMPLANT
NEEDLE HYPO 25X1 1.5 SAFETY (NEEDLE) ×3 IMPLANT
NS IRRIG 1000ML POUR BTL (IV SOLUTION) ×3 IMPLANT
PACK BASIN DAY SURGERY FS (CUSTOM PROCEDURE TRAY) ×3 IMPLANT
PENCIL SMOKE EVACUATOR (MISCELLANEOUS) IMPLANT
PIN SAFETY STERILE (MISCELLANEOUS) ×3 IMPLANT
PLASMABLADE 3.0S (MISCELLANEOUS) ×3
SLEEVE SCD COMPRESS KNEE MED (MISCELLANEOUS) ×3 IMPLANT
SPONGE LAP 18X18 RF (DISPOSABLE) ×6 IMPLANT
SUT ETHILON 2 0 FS 18 (SUTURE) ×6 IMPLANT
SUT MNCRL AB 4-0 PS2 18 (SUTURE) ×6 IMPLANT
SUT SILK 2 0 SH (SUTURE) IMPLANT
SUT VIC AB 0 SH 27 (SUTURE) ×6 IMPLANT
SUT VICRYL 3-0 CR8 SH (SUTURE) ×6 IMPLANT
SYR CONTROL 10ML LL (SYRINGE) ×3 IMPLANT
TOWEL GREEN STERILE FF (TOWEL DISPOSABLE) ×3 IMPLANT
TRAY FOLEY W/BAG SLVR 14FR LF (SET/KITS/TRAYS/PACK) IMPLANT
TUBE CONNECTING 20X1/4 (TUBING) ×3 IMPLANT
YANKAUER SUCT BULB TIP NO VENT (SUCTIONS) ×3 IMPLANT

## 2019-11-17 NOTE — Anesthesia Procedure Notes (Signed)
Anesthesia Regional Block: Pectoralis block   Pre-Anesthetic Checklist: ,, timeout performed, Correct Patient, Correct Site, Correct Laterality, Correct Procedure, Correct Position, site marked, Risks and benefits discussed,  Surgical consent,  Pre-op evaluation,  At surgeon's request and post-op pain management  Laterality: Right  Prep: chloraprep       Needles:  Injection technique: Single-shot  Needle Type: Echogenic Stimulator Needle     Needle Length: 10cm  Needle Gauge: 21     Additional Needles:   Narrative:  Start time: 11/17/2019 11:53 AM End time: 11/17/2019 12:01 PM Injection made incrementally with aspirations every 5 mL.  Performed by: Personally

## 2019-11-17 NOTE — Progress Notes (Signed)
Assisted nuc med tech with nuc med injections. Side rails up, monitors on throughout procedure. See vital signs in flow sheet. Tolerated Procedure well. 

## 2019-11-17 NOTE — Anesthesia Postprocedure Evaluation (Signed)
Anesthesia Post Note  Patient: Nichole Martinez  Procedure(s) Performed: BILATERAL MASTECTOMIES WITH RIGHT TARGETED SEED NODE DISSECTION (Bilateral Breast) RIGHT SENTINEL NODE BIOPSY (Right Axilla)     Patient location during evaluation: PACU Anesthesia Type: General Level of consciousness: awake and alert Pain management: pain level controlled Vital Signs Assessment: post-procedure vital signs reviewed and stable Respiratory status: spontaneous breathing, nonlabored ventilation, respiratory function stable and patient connected to nasal cannula oxygen Cardiovascular status: blood pressure returned to baseline and stable Postop Assessment: no apparent nausea or vomiting Anesthetic complications: no    Last Vitals:  Vitals:   11/17/19 1600 11/17/19 1615  BP: 119/81 120/71  Pulse: 67 66  Resp: 12 13  Temp:    SpO2: 100% 100%    Last Pain:  Vitals:   11/17/19 1615  TempSrc:   PainSc: Asleep                 Yarah Fuente S

## 2019-11-17 NOTE — Transfer of Care (Signed)
Immediate Anesthesia Transfer of Care Note  Patient: Nichole Martinez  Procedure(s) Performed: BILATERAL MASTECTOMIES WITH RIGHT TARGETED SEED NODE DISSECTION (Bilateral Breast) RIGHT SENTINEL NODE BIOPSY (Right Axilla)  Patient Location: PACU  Anesthesia Type:GA combined with regional for post-op pain  Level of Consciousness: drowsy  Airway & Oxygen Therapy: Patient Spontanous Breathing and Patient connected to face mask oxygen  Post-op Assessment: Report given to RN and Post -op Vital signs reviewed and stable  Post vital signs: Reviewed and stable  Last Vitals:  Vitals Value Taken Time  BP 116/72 11/17/19 1545  Temp    Pulse 69 11/17/19 1546  Resp 14 11/17/19 1546  SpO2 97 % 11/17/19 1546  Vitals shown include unvalidated device data.  Last Pain:  Vitals:   11/17/19 1121  TempSrc: Oral  PainSc: 0-No pain         Complications: No apparent anesthesia complications

## 2019-11-17 NOTE — Anesthesia Procedure Notes (Signed)
Anesthesia Regional Block: TAP block   Pre-Anesthetic Checklist: ,, timeout performed, Correct Patient, Correct Site, Correct Laterality, Correct Procedure, Correct Position, site marked, Risks and benefits discussed,  Surgical consent,  Pre-op evaluation,  At surgeon's request and post-op pain management  Laterality: Left  Prep: chloraprep       Needles:  Injection technique: Single-shot  Needle Type: Echogenic Stimulator Needle     Needle Length: 10cm  Needle Gauge: 21     Additional Needles:   Narrative:  Start time: 11/17/2019 12:07 PM End time: 11/17/2019 12:14 PM Injection made incrementally with aspirations every 5 mL.  Performed by: Personally

## 2019-11-17 NOTE — Progress Notes (Signed)
Assisted Dr. Singer with right, left, ultrasound guided, pectoralis block. Side rails up, monitors on throughout procedure. See vital signs in flow sheet. Tolerated Procedure well. °

## 2019-11-17 NOTE — H&P (Signed)
Nichole Martinez  Location: [facility] Patient #: [id number] DOB: Oct 18, 1950 Divorced / Language: Undefined / Race: White Female   History of Present Illness  The patient is a 69 year old female who presents for a follow-up for Breast cancer. the patient is a 69 year old white female who has a known right breast cancer that initially measured 3.2 cm with 3 abnormal lymph nodes one of which was positive. She has been treated with neoadjuvant chemotherapy and has tolerated it well. Her last dose of chemotherapy was yesterday. She returns today to discuss the surgery and plan for definitive surgery. She has elected for right mastectomy with sentinel node mapping and targeted node dissection as well as left prophylactic mastectomy.   Problem List/Past Medical LEFT BREAST MASS (N63.20)  MALIGNANT NEOPLASM OF UPPER-OUTER QUADRANT OF RIGHT BREAST IN FEMALE, ESTROGEN RECEPTOR POSITIVE (C50.411)   Past Surgical History  Breast Biopsy  Right. Breast Mass; Local Excision  Right. Hysterectomy (not due to cancer) - Complete  Sentinel Lymph Node Biopsy  Spinal Surgery - Lower Back   Diagnostic Studies History  Colonoscopy  never Mammogram  within last year Pap Smear  1-5 years ago  Allergies  Morphine Sulfate *ANALGESICS - OPIOID*  Anaphylaxis.  Medication History  ALPRAZolam (1MG  Tablet, Oral) Active. amLODIPine Besylate (5MG  Tablet, Oral) Active. Baclofen (10MG  Tablet, Oral) Active. Bisoprolol-hydroCHLOROthiazide (10-6.25MG  Tablet, Oral) Active. buPROPion HCl ER (XL) (150MG  Tablet ER 24HR, Oral) Active. Letrozole (2.5MG  Tablet, Oral) Active. Lidocaine-Prilocaine (2.5-2.5% Cream, External) Active. Lisinopril-hydroCHLOROthiazide (10-12.5MG  Tablet, Oral) Active. Loratadine (10MG  Tablet, Oral) Active. Meloxicam (15MG  Tablet, Oral) Active. metFORMIN HCl (1000MG  Tablet, Oral) Active. Ondansetron HCl (8MG  Tablet, Oral) Active. Prochlorperazine Maleate (10MG   Tablet, Oral) Active. Rizatriptan Benzoate (5MG  Tablet, Oral) Active. Simvastatin (20MG  Tablet, Oral) Active. Vitamin D (Ergocalciferol) (1.25 MG(50000 UT) Capsule, Oral) Active. Vitamin E (1000UNIT Capsule, Oral) Active. Vitamin D3 (125 MCG(5000 UT) Tablet Chewable, Oral) Active. Multi-Vitamin (Oral) Active. Omega 3-6-9 (Oral) Active. Medications Reconciled  Social History  Caffeine use  Coffee, Tea. No alcohol use  No drug use  Tobacco use  Former smoker.  Family History  Family history unknown  First Degree Relatives   Pregnancy / Birth History  Age at menarche  5 years. Age of menopause  59-50 Gravida  1 Maternal age  19-25 Para  1  Other Problems  Anxiety Disorder  Arthritis  Breast Cancer  Diabetes Mellitus  High blood pressure  Hypercholesterolemia  Kidney Stone     Review of Systems  General Not Present- Appetite Loss, Chills, Fatigue, Fever, Night Sweats, Weight Gain and Weight Loss. Skin Not Present- Change in Wart/Mole, Dryness, Hives, Jaundice, New Lesions, Non-Healing Wounds, Rash and Ulcer. HEENT Present- Seasonal Allergies and Wears glasses/contact lenses. Not Present- Earache, Hearing Loss, Hoarseness, Nose Bleed, Oral Ulcers, Ringing in the Ears, Sinus Pain, Sore Throat, Visual Disturbances and Yellow Eyes. Breast Present- Breast Mass. Not Present- Breast Pain, Nipple Discharge and Skin Changes. Cardiovascular Not Present- Chest Pain, Difficulty Breathing Lying Down, Leg Cramps, Palpitations, Rapid Heart Rate, Shortness of Breath and Swelling of Extremities. Gastrointestinal Not Present- Abdominal Pain, Bloating, Bloody Stool, Change in Bowel Habits, Chronic diarrhea, Constipation, Difficulty Swallowing, Excessive gas, Gets full quickly at meals, Hemorrhoids, Indigestion, Nausea, Rectal Pain and Vomiting. Female Genitourinary Not Present- Frequency, Nocturia, Painful Urination, Pelvic Pain and Urgency. Musculoskeletal Present-  Joint Pain, Joint Stiffness and Swelling of Extremities. Not Present- Back Pain, Muscle Pain and Muscle Weakness. Neurological Not Present- Decreased Memory, Fainting, Headaches, Numbness, Seizures, Tingling, Tremor, Trouble walking and Weakness.  Psychiatric Present- Anxiety and Change in Sleep Pattern. Not Present- Bipolar, Depression, Fearful and Frequent crying. Endocrine Not Present- Cold Intolerance, Excessive Hunger, Hair Changes, Heat Intolerance, Hot flashes and New Diabetes. Hematology Not Present- Blood Thinners, Easy Bruising, Excessive bleeding, Gland problems, HIV and Persistent Infections.  Vitals  Weight: 203.25 lb Height: 65in Body Surface Area: 1.99 m Body Mass Index: 33.82 kg/m        Physical Exam  General Mental Status-Alert. General Appearance-Consistent with stated age. Hydration-Well hydrated. Voice-Normal.  Head and Neck Head-normocephalic, atraumatic with no lesions or palpable masses. Trachea-midline. Thyroid Gland Characteristics - normal size and consistency.  Eye Eyeball - Bilateral-Extraocular movements intact. Sclera/Conjunctiva - Bilateral-No scleral icterus.  Chest and Lung Exam Chest and lung exam reveals -quiet, even and easy respiratory effort with no use of accessory muscles and on auscultation, normal breath sounds, no adventitious sounds and normal vocal resonance. Inspection Chest Wall - Normal. Back - normal.  Breast Note: there is no palpable mass in the right breast although right at the upper edge of the areola there is some slight retraction of the skin. There is no palpable mass in the left breast. There is a mobile palpable enlarged lymph node in the right axilla. No other palpable lymphadenopathy   Cardiovascular Cardiovascular examination reveals -normal heart sounds, regular rate and rhythm with no murmurs and normal pedal pulses bilaterally.  Abdomen Inspection Inspection of the abdomen  reveals - No Hernias. Skin - Scar - no surgical scars. Palpation/Percussion Palpation and Percussion of the abdomen reveal - Soft, Non Tender, No Rebound tenderness, No Rigidity (guarding) and No hepatosplenomegaly. Auscultation Auscultation of the abdomen reveals - Bowel sounds normal.  Neurologic Neurologic evaluation reveals -alert and oriented x 3 with no impairment of recent or remote memory. Mental Status-Normal.  Musculoskeletal Normal Exam - Left-Upper Extremity Strength Normal and Lower Extremity Strength Normal. Normal Exam - Right-Upper Extremity Strength Normal and Lower Extremity Strength Normal.  Lymphatic Head & Neck  General Head & Neck Lymphatics: Bilateral - Description - Normal. Axillary  General Axillary Region: Bilateral - Description - Normal. Tenderness - Non Tender. Femoral & Inguinal  Generalized Femoral & Inguinal Lymphatics: Bilateral - Description - Normal. Tenderness - Non Tender.    Assessment & Plan  MALIGNANT NEOPLASM OF UPPER-OUTER QUADRANT OF RIGHT BREAST IN FEMALE, ESTROGEN RECEPTOR POSITIVE (C50.411) Impression: the patient has a known locally advanced right breast cancer with a positive node. She has responded well to neoadjuvant chemotherapy. The cancer in the breast is no longer palpable. She still has 1 abnormal palpable lymph node in the right axilla. She does not want a full node dissection. She has elected for right mastectomy with targeted node dissection and sentinel node biopsy as well as left prophylactic mastectomy. I have discussed with her in detail the risks and benefits of the operation as well as some of the technical aspects and she understands and wishes to proceed. This patient encounter took 20 minutes today to perform the following: take history, perform exam, review outside records, interpret imaging, counsel the patient on their diagnosis and document encounter, findings & plan in the EHR Current Plans MRI BREAST  BILATERAL WO [place] [facility]:[id number]) Addendum Note(Arlyss Weathersby S. Marlou Starks MD; 10/01/2019 4:04 PM) She has requested not to have surgery at [facility] so we will plan for this at [facility]

## 2019-11-17 NOTE — Discharge Instructions (Signed)
Post Anesthesia Home Care Instructions  Activity: Get plenty of rest for the remainder of the day. A responsible individual must stay with you for 24 hours following the procedure.  For the next 24 hours, DO NOT: -Drive a car -Paediatric nurse -Drink alcoholic beverages -Take any medication unless instructed by your physician -Make any legal decisions or sign important papers.  Meals: Start with liquid foods such as gelatin or soup. Progress to regular foods as tolerated. Avoid greasy, spicy, heavy foods. If nausea and/or vomiting occur, drink only clear liquids until the nausea and/or vomiting subsides. Call your physician if vomiting continues.  Special Instructions/Symptoms: Your throat may feel dry or sore from the anesthesia or the breathing tube placed in your throat during surgery. If this causes discomfort, gargle with warm salt water. The discomfort should disappear within 24 hours.  If you had a scopolamine patch placed behind your ear for the management of post- operative nausea and/or vomiting:  1. The medication in the patch is effective for 72 hours, after which it should be removed.  Wrap patch in a tissue and discard in the trash. Wash hands thoroughly with soap and water. 2. You may remove the patch earlier than 72 hours if you experience unpleasant side effects which may include dry mouth, dizziness or visual disturbances. 3. Avoid touching the patch. Wash your hands with soap and water after contact with the patch.    About my Jackson-Pratt Bulb Drain  What is a Jackson-Pratt bulb? A Jackson-Pratt is a soft, round device used to collect drainage. It is connected to a long, thin drainage catheter, which is held in place by one or two small stiches near your surgical incision site. When the bulb is squeezed, it forms a vacuum, forcing the drainage to empty into the bulb.  Emptying the Jackson-Pratt bulb- To empty the bulb: 1. Release the plug on the top of the  bulb. 2. Pour the bulb's contents into a measuring container which your nurse will provide. 3. Record the time emptied and amount of drainage. Empty the drain(s) as often as your     doctor or nurse recommends.  Date                  Time                    Amount (Drain 1)                 Amount (Drain 2)  _____________________________________________________________________  _____________________________________________________________________  _____________________________________________________________________  _____________________________________________________________________  _____________________________________________________________________  _____________________________________________________________________  _____________________________________________________________________  _____________________________________________________________________  Squeezing the Jackson-Pratt Bulb- To squeeze the bulb: 1. Make sure the plug at the top of the bulb is open. 2. Squeeze the bulb tightly in your fist. You will hear air squeezing from the bulb. 3. Replace the plug while the bulb is squeezed. 4. Use a safety pin to attach the bulb to your clothing. This will keep the catheter from     pulling at the bulb insertion site.  When to call your doctor- Call your doctor if:  Drain site becomes red, swollen or hot.  You have a fever greater than 101 degrees F.  There is oozing at the drain site.  Drain falls out (apply a guaze bandage over the drain hole and secure it with tape).  Drainage increases daily not related to activity patterns. (You will usually have more drainage when you are active than when you are resting.)  Drainage has a  bad odor.  Information for Discharge Teaching: EXPAREL (bupivacaine liposome injectable suspension)   Your surgeon or anesthesiologist gave you EXPAREL(bupivacaine) to help control your pain after surgery.   EXPAREL is a local  anesthetic that provides pain relief by numbing the tissue around the surgical site.  EXPAREL is designed to release pain medication over time and can control pain for up to 72 hours.  Depending on how you respond to EXPAREL, you may require less pain medication during your recovery.  Possible side effects:  Temporary loss of sensation or ability to move in the area where bupivacaine was injected.  Nausea, vomiting, constipation  Rarely, numbness and tingling in your mouth or lips, lightheadedness, or anxiety may occur.  Call your doctor right away if you think you may be experiencing any of these sensations, or if you have other questions regarding possible side effects.  Follow all other discharge instructions given to you by your surgeon or nurse. Eat a healthy diet and drink plenty of water or other fluids.  If you return to the hospital for any reason within 96 hours following the administration of EXPAREL, it is important for health care providers to know that you have received this anesthetic. A teal colored band has been placed on your arm with the date, time and amount of EXPAREL you have received in order to alert and inform your health care providers. Please leave this armband in place for the full 96 hours following administration, and then you may remove the band.Information for Discharge Teaching: EXPAREL (bupivacaine liposome injectable suspension)

## 2019-11-17 NOTE — Anesthesia Procedure Notes (Signed)
Procedure Name: LMA Insertion Date/Time: 11/17/2019 12:43 PM Performed by: Lieutenant Diego, CRNA Pre-anesthesia Checklist: Patient identified, Emergency Drugs available, Suction available and Patient being monitored Patient Re-evaluated:Patient Re-evaluated prior to induction Oxygen Delivery Method: Circle system utilized Preoxygenation: Pre-oxygenation with 100% oxygen Induction Type: IV induction Ventilation: Mask ventilation without difficulty LMA: LMA inserted LMA Size: 4.0 Number of attempts: 1 Placement Confirmation: positive ETCO2 and breath sounds checked- equal and bilateral Tube secured with: Tape Dental Injury: Teeth and Oropharynx as per pre-operative assessment

## 2019-11-17 NOTE — Op Note (Signed)
11/17/2019  3:16 PM  PATIENT:  Nichole Martinez  69 y.o. female  PRE-OPERATIVE DIAGNOSIS:  RIGHT BREAST CANCER  POST-OPERATIVE DIAGNOSIS:  RIGHT BREAST CANCER  PROCEDURE:  Procedure(s): RIGHT MASTECTOMY WITH RIGHT radioactive seed localized targeted node dissection and deep right axillary sentinel node mapping with injection of blue dye Left prophylactic mastectomy   SURGEON:  Surgeon(s) and Role:    Jovita Kussmaul, MD - Primary  PHYSICIAN ASSISTANT:   ASSISTANTS: none   ANESTHESIA:   local and general  EBL:  minimal   BLOOD ADMINISTERED:none  DRAINS: (2) Jackson-Pratt drain(s) with closed bulb suction in the prepectoral space   LOCAL MEDICATIONS USED:  OTHER exparel pec block  SPECIMEN:  Source of Specimen:  Right mastectomy, right targeted lymph node with radioactive seed, right sentinel node, left mastectomy  DISPOSITION OF SPECIMEN:  PATHOLOGY  COUNTS:  YES  TOURNIQUET:  * No tourniquets in log *  DICTATION: .Dragon Dictation   After informed consent was obtained the patient was brought to the operating room and placed in the supine position on the operating table.  After adequate induction of general anesthesia the patient's bilateral chest, breast, and axillary area were prepped with ChloraPrep, allowed to dry, and draped in usual sterile manner.  An appropriate timeout was performed.  Earlier in the day the patient underwent injection of 1 mCi of technetium sulfur colloid in the subareolar position on the right.  Previously an I-125 seed was placed in a right axillary lymph node that was originally positive.  At this point, 2 cc of methylene blue and 3 cc of injectable saline were also injected in the subareolar position on the right.  Attention was first turned to the left breast.  An elliptical incision was made around the nipple and areola complex in order to minimize the excess skin with a 10 blade knife.  The incision was carried through the skin and subcutaneous  tissue sharply with the PlasmaBlade.  Breast hooks were used to elevate the skin flaps anteriorly towards the ceiling.  Thin skin flaps were then created circumferentially by dissection between the breast tissue and the subcutaneous fat.  This dissection was carried all the way to the chest wall circumferentially.  Next the breast was removed from the pectoralis muscle with the pectoralis fascia.  Once this was accomplished the entire breast was removed from the patient.  It was marked with a stitch on the lateral skin.  The breast was then sent to pathology for further evaluation.  Hemostasis was achieved using the PlasmaBlade.  2 small medial vessels were controlled with figure-of-eight Vicryl stitches.  Several small lateral vessels were controlled with clips.  The lateral axillary tissue was then tacked to the medial chest wall with interrupted 0 Vicryl stitches.  The wound was irrigated with copious amounts of saline.  A small stab incision was made inferior to the operative bed and near the anterior axillary line.  A tonsil clamp was placed through this opening and used to bring a 19 [place] round Blake drain into the operative bed.  The drain was anchored to the skin with a 2-0 nylon stitch.  Next the superior and inferior skin flaps were grossly reapproximated with interrupted 3-0 Vicryl stitches.  The skin was then closed with a running 4-0 Monocryl subcuticular stitch.  The drain was placed to bulb suction and there was a good seal.  The skin flaps were healthy.  Attention was then turned to the right breast.  A  similar elliptical incision was made around the nipple and areola complex with a 10 blade knife in order to minimize the excess skin.  The incision was carried through the skin and subcutaneous tissue sharply with the PlasmaBlade.  Breast hooks were then used to elevate the skin flaps anteriorly towards the ceiling.  Thin skin flaps were then created circumferentially by dissecting between the  breast tissue and the subcutaneous fat.  This dissection was carried all the way to the chest wall circumferentially with the PlasmaBlade.  Next the breast was removed from the pectoralis muscle with the pectoralis fascia.  Once the dissection reached the right axilla then the neoprobe was set to I-125 in the area of radioactivity was readily identified.  This lymph node and radioactive seed were excised sharply with the PlasmaBlade.  A specimen radiograph confirmed the seed in the specimen.  This was sent as the targeted node to pathology.  The neoprobe was set to technetium.  The deep right axillary space was already opened.  1 other small lymph node was also identified that had very mild radioactivity with the neoprobe set to technetium.  This was removed sharply with the PlasmaBlade and the surrounding small vessels were controlled with clips.  Ex vivo counts on this node were approximately 20.  This was sent as sentinel node #1.  No other hot or palpable lymph nodes were identified in the right axilla.  On examining the right axilla I could identify most of the structures including the thoracodorsal and long thoracic nerves, axillary vein, the serratus muscle, and latissimus muscle.  There was not much lymphoid tissue left in the axilla after the specimens were removed.  The breast was then removed the rest of the way from the chest wall.  Once the breast was removed it was oriented with a stitch on the lateral skin and sent to pathology for further evaluation.  Hemostasis was achieved using the PlasmaBlade.  The wound was irrigated with copious amounts of saline.  2 small medial vessels were controlled with figure-of-eight 3-0 Vicryl stitches.  Several lateral vessels were controlled with clips.  The lateral axillary tissue was then tacked to the chest wall with interrupted 0 Vicryl stitches.  A small stab incision was then made near the anterior axillary line inferior to the operative bed.  A tonsil clamp was  placed through this opening and used to bring a 19 [place] round Blake drain into the operative bed.  The drain was anchored to the skin with a 2-0 nylon stitch.  Next the superior and inferior skin flaps were grossly reapproximated with interrupted 3-0 Vicryl stitches.  The skin was then closed with a running 4-0 Monocryl subcuticular stitch.  The drain was placed to bulb suction and there was a good seal.  The skin flaps appeared healthy.  Dermabond dressings were applied.  The patient tolerated the procedure well.  At the end of the case all needle sponge and instrument counts were correct.  The patient was then awakened and taken to recovery in stable condition.  PLAN OF CARE: Admit for overnight observation  PATIENT DISPOSITION:  PACU - hemodynamically stable.   Delay start of Pharmacological VTE agent (>24hrs) due to surgical blood loss or risk of bleeding: not applicable

## 2019-11-17 NOTE — Anesthesia Preprocedure Evaluation (Addendum)
Anesthesia Evaluation  Patient identified by MRN, date of birth, ID band Patient awake    Reviewed: Allergy & Precautions, NPO status , Patient's Chart, lab work & pertinent test results  Airway Mallampati: II  TM Distance: >3 FB Neck ROM: Full    Dental no notable dental hx. (+) Dental Advisory Given   Pulmonary neg pulmonary ROS, former smoker,    Pulmonary exam normal        Cardiovascular hypertension, Pt. on medications and Pt. on home beta blockers Normal cardiovascular exam     Neuro/Psych  Headaches, PSYCHIATRIC DISORDERS Depression    GI/Hepatic Neg liver ROS, GERD  ,  Endo/Other  negative endocrine ROSdiabetes  Renal/GU negative Renal ROS  negative genitourinary   Musculoskeletal negative musculoskeletal ROS (+)   Abdominal   Peds negative pediatric ROS (+)  Hematology negative hematology ROS (+)   Anesthesia Other Findings   Reproductive/Obstetrics negative OB ROS                            Anesthesia Physical Anesthesia Plan  ASA: II  Anesthesia Plan: General   Post-op Pain Management:  Regional for Post-op pain   Induction: Intravenous  PONV Risk Score and Plan: 4 or greater and Ondansetron, Dexamethasone, Midazolam and Scopolamine patch - Pre-op  Airway Management Planned: LMA  Additional Equipment:   Intra-op Plan:   Post-operative Plan: Extubation in OR  Informed Consent: I have reviewed the patients History and Physical, chart, labs and discussed the procedure including the risks, benefits and alternatives for the proposed anesthesia with the patient or authorized representative who has indicated his/her understanding and acceptance.     Dental advisory given  Plan Discussed with: CRNA and Anesthesiologist  Anesthesia Plan Comments:         Anesthesia Quick Evaluation

## 2019-11-18 NOTE — Progress Notes (Signed)
1 Day Post-Op   Subjective/Chief Complaint: No complaints   Objective: Vital signs in last 24 hours: Temp:  [96.8 F (36 C)-98.5 F (36.9 C)] 97.1 F (36.2 C) (03/04 0200) Pulse Rate:  [50-77] 72 (03/04 0200) Resp:  [10-22] 18 (03/04 0200) BP: (111-144)/(69-91) 115/73 (03/04 0200) SpO2:  [91 %-100 %] 98 % (03/04 0500) Weight:  [87.1 kg] 87.1 kg (03/03 1121)    Intake/Output from previous day: 03/03 0701 - 03/04 0700 In: 2270 [P.O.:570; I.V.:1700] Out: 1635 [Urine:1400; Drains:185; Blood:50] Intake/Output this shift: No intake/output data recorded.  General appearance: alert and cooperative Resp: clear to auscultation bilaterally Chest wall: skin flaps look good Cardio: regular rate and rhythm GI: soft, non-tender; bowel sounds normal; no masses,  no organomegaly  Lab Results:  No results for input(s): WBC, HGB, HCT, PLT in the last 72 hours. BMET No results for input(s): NA, K, CL, CO2, GLUCOSE, BUN, CREATININE, CALCIUM in the last 72 hours. PT/INR No results for input(s): LABPROT, INR in the last 72 hours. ABG No results for input(s): PHART, HCO3 in the last 72 hours.  Invalid input(s): PCO2, PO2  Studies/Results: NM Sentinel Node Inj-No Rpt (Breast)  Result Date: 11/17/2019 Sulfur colloid was injected by the nuclear medicine technologist for melanoma sentinel node.   MM Breast Surgical Specimen  Result Date: 11/17/2019 CLINICAL DATA:  Surgical excision of a previously biopsied metastatic right axillary lymph node. The patient is also having a right mastectomy for invasive mammary carcinoma. EXAM: SPECIMEN RADIOGRAPH OF THE RIGHT AXILLA COMPARISON:  Previous exam(s). FINDINGS: Status post excision of the right axilla. The radioactive seed and coil shaped biopsy marker clip are present, completely intact. There is also a surgical clip in the periphery of the specimen. IMPRESSION: Specimen radiograph of the right axilla. Electronically Signed   By: Claudie Revering M.D.    On: 11/17/2019 14:39   [place] RT RADIOACTIVE SEED LOC  Result Date: 11/16/2019 CLINICAL DATA:  Localization of a previously biopsied lymph node prior to surgery EXAM: ULTRASOUND GUIDED RADIOACTIVE SEED LOCALIZATION OF THE RIGHT BREAST COMPARISON:  Previous exam(s). FINDINGS: Patient presents for radioactive seed localization prior to surgery. I met with the patient and we discussed the procedure of seed localization including benefits and alternatives. We discussed the high likelihood of a successful procedure. We discussed the risks of the procedure including infection, bleeding, tissue injury and further surgery. We discussed the low dose of radioactivity involved in the procedure. Informed, written consent was given. The usual time-out protocol was performed immediately prior to the procedure. Using ultrasound guidance, sterile technique, 1% lidocaine and an I-125 radioactive seed, the previously biopsied lymph node was localized using a lateral approach. The follow-up mammogram images confirm the seed in the expected location and were marked for the surgeon. Follow-up survey of the patient confirms presence of the radioactive seed. Order number of I-125 seed:  [id number]. Total activity:  1.308 millicuries reference Date: October 05, 2019 The patient tolerated the procedure well and was released from the [facility]. She was given instructions regarding seed removal. IMPRESSION: Radioactive seed localization right breast. No apparent complications. Electronically Signed   By: Dorise Bullion III M.D   On: 11/16/2019 13:21   MM CLIP PLACEMENT RIGHT  Result Date: 11/16/2019 CLINICAL DATA:  Localization of a previously biopsied lymph node prior to surgery EXAM: ULTRASOUND GUIDED RADIOACTIVE SEED LOCALIZATION OF THE RIGHT BREAST COMPARISON:  Previous exam(s). FINDINGS: Patient presents for radioactive seed localization prior to surgery. I met with the patient  and we discussed the procedure of seed localization  including benefits and alternatives. We discussed the high likelihood of a successful procedure. We discussed the risks of the procedure including infection, bleeding, tissue injury and further surgery. We discussed the low dose of radioactivity involved in the procedure. Informed, written consent was given. The usual time-out protocol was performed immediately prior to the procedure. Using ultrasound guidance, sterile technique, 1% lidocaine and an I-125 radioactive seed, the previously biopsied lymph node was localized using a lateral approach. The follow-up mammogram images confirm the seed in the expected location and were marked for the surgeon. Follow-up survey of the patient confirms presence of the radioactive seed. Order number of I-125 seed:  [id number]. Total activity:  8.588 millicuries reference Date: October 05, 2019 The patient tolerated the procedure well and was released from the [facility]. She was given instructions regarding seed removal. IMPRESSION: Radioactive seed localization right breast. No apparent complications. Electronically Signed   By: Dorise Bullion III M.D   On: 11/16/2019 13:21    Anti-infectives: Anti-infectives (From admission, onward)   Start     Dose/Rate Route Frequency Ordered Stop   11/17/19 1115  ceFAZolin (ANCEF) IVPB 2g/100 mL premix     2 g 200 mL/hr over 30 Minutes Intravenous On call to O.R. 11/17/19 1104 11/17/19 1252      Assessment/Plan: s/p Procedure(s): BILATERAL MASTECTOMIES WITH RIGHT TARGETED SEED NODE DISSECTION (Bilateral) RIGHT SENTINEL NODE BIOPSY (Right) Advance diet Discharge  LOS: 0 days    Autumn Messing III 11/18/2019

## 2019-11-23 LAB — SURGICAL PATHOLOGY

## 2019-11-23 NOTE — Discharge Summary (Signed)
Physician Discharge Summary  Patient ID: Nichole Martinez MRN: [id number] DOB/AGE: July 12, 1951 69 y.o.  Admit date: 11/17/2019 Discharge date: 11/23/2019  Admission Diagnoses:  Discharge Diagnoses:  Active Problems:   Cancer of right female breast [facility])   Discharged Condition: good  Hospital Course: the pt underwent bilateral mastectomy for breast cancer. She tolerated surgery well. On pod 1 she was ready for d/c home  Consults: None  Significant Diagnostic Studies: none  Treatments: surgery: as above  Discharge Exam: Blood pressure 115/73, pulse 78, temperature (!) 97.1 F (36.2 C), resp. rate 18, height 5\' 5"  (1.651 m), weight 87.1 kg, SpO2 95 %. Chest wall: skin flaps look good  Disposition: Discharge disposition: 01-Home or Self Care       Discharge Instructions    Call MD for:  difficulty breathing, headache or visual disturbances   Complete by: As directed    Call MD for:  difficulty breathing, headache or visual disturbances   Complete by: As directed    Call MD for:  extreme fatigue   Complete by: As directed    Call MD for:  extreme fatigue   Complete by: As directed    Call MD for:  hives   Complete by: As directed    Call MD for:  hives   Complete by: As directed    Call MD for:  persistant dizziness or light-headedness   Complete by: As directed    Call MD for:  persistant dizziness or light-headedness   Complete by: As directed    Call MD for:  persistant nausea and vomiting   Complete by: As directed    Call MD for:  persistant nausea and vomiting   Complete by: As directed    Call MD for:  redness, tenderness, or signs of infection (pain, swelling, redness, odor or green/yellow discharge around incision site)   Complete by: As directed    Call MD for:  redness, tenderness, or signs of infection (pain, swelling, redness, odor or green/yellow discharge around incision site)   Complete by: As directed    Call MD for:  severe uncontrolled pain    Complete by: As directed    Call MD for:  severe uncontrolled pain   Complete by: As directed    Call MD for:  temperature >100.4   Complete by: As directed    Call MD for:  temperature >100.4   Complete by: As directed    Diet - low sodium heart healthy   Complete by: As directed    Diet - low sodium heart healthy   Complete by: As directed    Discharge instructions   Complete by: As directed    Sponge bathe while drains are in.  No overhead activity.  Empty drains, record output, recharge bulb twice a day.  Wear binder most of the time   Discharge instructions   Complete by: As directed    Sponge bathe while drains are in. No overhead activity. Wear binder most of the time. Empty drains, record output, recharge bulb twice a day   Increase activity slowly   Complete by: As directed    Increase activity slowly   Complete by: As directed    No wound care   Complete by: As directed    No wound care   Complete by: As directed      Allergies as of 11/18/2019      Reactions   Morphine    Anaphallaxis.      Medication List  TAKE these medications   ALPRAZolam 1 MG tablet Commonly known as: XANAX TAKE ONE TABLET BY MOUTH AT BEDTIME AS NEEDED FOR ANXIETY   amLODipine 5 MG tablet Commonly known as: NORVASC Take 1 tablet (5 mg total) by mouth daily.   baclofen 10 MG tablet Commonly known as: LIORESAL Take 1 tablet (10 mg total) by mouth daily.   bisoprolol-hydrochlorothiazide 10-6.25 MG tablet Commonly known as: ZIAC Take 1 tablet by mouth 2 (two) times daily.   buPROPion 150 MG 24 hr tablet Commonly known as: WELLBUTRIN XL Take 1 tablet (150 mg total) by mouth 2 (two) times a day.   HYDROcodone-acetaminophen 5-325 MG tablet Commonly known as: NORCO/VICODIN Take 1-2 tablets by mouth every 6 (six) hours as needed for moderate pain or severe pain.   letrozole 2.5 MG tablet Commonly known as: FEMARA Take 1 tablet (2.5 mg total) by mouth daily.    lidocaine-prilocaine cream Commonly known as: EMLA Apply to affected area once   lisinopril-hydrochlorothiazide 10-12.5 MG tablet Commonly known as: ZESTORETIC Take 1 tablet by mouth daily.   loratadine 10 MG tablet Commonly known as: CLARITIN Take 1 tablet (10 mg total) by mouth daily as needed.   meloxicam 15 MG tablet Commonly known as: MOBIC Take 1 tablet (15 mg total) by mouth daily.   metFORMIN 1000 MG tablet Commonly known as: GLUCOPHAGE Take 1 tablet (1,000 mg total) by mouth 2 (two) times daily with a meal.   MULTIPLE VITAMINS PO MULTIVITAMINS (Oral Tablet)  1 Every Day for 0 days  Quantity: 0.00;  Refills: 0   Ordered :17-Apr-2011  [name] ;  Started 24-February-2009 Active   OMEGA 3-6-9 FATTY ACIDS PO Reported on 10/04/2015   ondansetron 8 MG tablet Commonly known as: Zofran Take 1 tablet (8 mg total) by mouth 2 (two) times daily as needed.   prochlorperazine 10 MG tablet Commonly known as: COMPAZINE Take 1 tablet (10 mg total) by mouth every 6 (six) hours as needed (Nausea or vomiting).   rizatriptan 5 MG tablet Commonly known as: MAXALT TAKE ONE TABLET BY MOUTH AS NEEDED FOR MIGRAINE. MAY REPEAT IN 2 HOURS IF NEEDED.   simvastatin 20 MG tablet Commonly known as: ZOCOR Take 1 tablet (20 mg total) by mouth at bedtime.   traMADol 50 MG tablet Commonly known as: ULTRAM Take 1-2 tablets (50-100 mg total) by mouth every 6 (six) hours as needed.   Vitamin D (Ergocalciferol) 1.25 MG (50000 UNIT) Caps capsule Commonly known as: DRISDOL Take 1 capsule (50,000 Units total) by mouth every 7 (seven) days.   Vitamin D3 125 MCG (5000 UT) Caps Take by mouth.   vitamin E 1000 UNIT capsule Take 1,000 Units by mouth daily.      Follow-up Information    Autumn Messing III, MD In 2 weeks.   Specialty: General Surgery Contact information: [place] [address] [phone]           Signed: Autumn Messing III 11/23/2019, 8:32 AM

## 2019-11-25 NOTE — Progress Notes (Signed)
[place]  [phone] [phone]  ID: Nichole Martinez OB: 10-24-1950  MR#: [id number]  AQT#:[id number]  Patient Care Team: [name], PA-C as PCP - General (Family Medicine)  CHIEF COMPLAINT: Clinical stage IIa ER/PR positive, HER-2 negative invasive carcinoma of the central portion of the right breast.  INTERVAL HISTORY: Patient returns to clinic today for further evaluation and postoperative follow-up. She had bilateral mastectomy completed on November 17, 2019 and tolerated her procedure well. She currently feels well and is asymptomatic. She is tolerating letrozole without significant side effects. She has no neurologic complaints. She denies any recent fevers or illnesses. She has a good appetite and denies weight loss.  She has no chest pain, shortness of breath, cough, or hemoptysis. She denies any nausea, vomiting, constipation, or diarrhea.  She has no urinary complaints. Patient offers no specific complaints today.  REVIEW OF SYSTEMS:   Review of Systems  Constitutional: Negative.  Negative for fever, malaise/fatigue and weight loss.  Respiratory: Negative.  Negative for cough, hemoptysis and shortness of breath.   Cardiovascular: Negative.  Negative for chest pain and leg swelling.  Gastrointestinal: Negative.  Negative for abdominal pain, diarrhea and nausea.  Genitourinary: Negative.  Negative for dysuria.  Musculoskeletal: Negative.  Negative for back pain and myalgias.  Skin: Negative.  Negative for rash.  Neurological: Negative.  Negative for focal weakness, weakness and headaches.  Psychiatric/Behavioral: Negative.  The patient is not nervous/anxious.     As per HPI. Otherwise, a complete review of systems is negative.  PAST MEDICAL HISTORY: Past Medical History:  Diagnosis Date  . Allergy   . Breast cancer, right ([place]) 2020   Currently taking hormonal chemo tx's.   . Depression   . Diabetes mellitus without  complication ([place]) 5625  . Dyspnea    due to weight  . Hyperlipidemia   . Hypertension   . Osteoporosis   . Trigeminal neuralgia     PAST SURGICAL HISTORY: Past Surgical History:  Procedure Laterality Date  . ABDOMINAL HYSTERECTOMY  1978  . BREAST BIOPSY Right 01/06/2019   [place] bx x 2.  Ribbon marker for mass and hydro for Lymph node. Path pending  . BREAST CYST EXCISION Right 1986  . BREAST EXCISIONAL BIOPSY    . IR IMAGING GUIDED PORT INSERTION  05/12/2019  . LAMINECTOMY  1995   L1, L2, L3  . MASTECTOMY WITH AXILLARY LYMPH NODE DISSECTION Bilateral 11/17/2019   Procedure: BILATERAL MASTECTOMIES WITH RIGHT TARGETED SEED NODE DISSECTION;  Surgeon: Jovita Kussmaul, MD;  Location: [facility];  Service: General;  Laterality: Bilateral;  . SENTINEL NODE BIOPSY Right 11/17/2019   Procedure: RIGHT SENTINEL NODE BIOPSY;  Surgeon: Jovita Kussmaul, MD;  Location: [facility];  Service: General;  Laterality: Right;  . SPINE SURGERY      FAMILY HISTORY: Family History  Adopted: Yes  Family history unknown: Yes    ADVANCED DIRECTIVES (Y/N):  N  HEALTH MAINTENANCE: Social History   Tobacco Use  . Smoking status: Former Smoker    Quit date: 03/16/2000    Years since quitting: 19.7  . Smokeless tobacco: Never Used  Substance Use Topics  . Alcohol use: No  . Drug use: No     Colonoscopy:  PAP:  Bone density:  Lipid panel:  Allergies  Allergen Reactions  . Morphine     Anaphallaxis.    Current Outpatient Medications  Medication Sig Dispense Refill  . ALPRAZolam (XANAX) 1 MG  tablet TAKE ONE TABLET BY MOUTH AT BEDTIME AS NEEDED FOR ANXIETY 90 tablet 1  . amLODipine (NORVASC) 5 MG tablet Take 1 tablet (5 mg total) by mouth daily. 90 tablet 3  . baclofen (LIORESAL) 10 MG tablet Take 1 tablet (10 mg total) by mouth daily. 90 tablet 3  . bisoprolol-hydrochlorothiazide (ZIAC) 10-6.25 MG tablet Take 1 tablet by mouth 2 (two) times daily. 180 tablet 3  .  buPROPion (WELLBUTRIN XL) 150 MG 24 hr tablet Take 1 tablet (150 mg total) by mouth 2 (two) times a day. 180 tablet 1  . Cholecalciferol (VITAMIN D3) 5000 units CAPS Take by mouth.    Marland Kitchen lisinopril-hydrochlorothiazide (ZESTORETIC) 10-12.5 MG tablet Take 1 tablet by mouth daily. 90 tablet 3  . loratadine (CLARITIN) 10 MG tablet Take 1 tablet (10 mg total) by mouth daily as needed. 90 tablet 3  . meloxicam (MOBIC) 15 MG tablet Take 1 tablet (15 mg total) by mouth daily. 90 tablet 3  . metFORMIN (GLUCOPHAGE) 1000 MG tablet Take 1 tablet (1,000 mg total) by mouth 2 (two) times daily with a meal. 180 tablet 3  . MULTIPLE VITAMINS PO MULTIVITAMINS (Oral Tablet)  1 Every Day for 0 days  Quantity: 0.00;  Refills: 0   Ordered :17-Apr-2011  [name] ;  Started 24-February-2009 Active    . OMEGA 3-6-9 FATTY ACIDS PO Reported on 10/04/2015    . ondansetron (ZOFRAN) 8 MG tablet Take 1 tablet (8 mg total) by mouth 2 (two) times daily as needed. 30 tablet 2  . rizatriptan (MAXALT) 5 MG tablet TAKE ONE TABLET BY MOUTH AS NEEDED FOR MIGRAINE. MAY REPEAT IN 2 HOURS IF NEEDED. 10 tablet 11  . simvastatin (ZOCOR) 20 MG tablet Take 1 tablet (20 mg total) by mouth at bedtime. 90 tablet 3  . Vitamin D, Ergocalciferol, (DRISDOL) 1.25 MG (50000 UT) CAPS capsule Take 1 capsule (50,000 Units total) by mouth every 7 (seven) days. 12 capsule 4  . vitamin E 1000 UNIT capsule Take 1,000 Units by mouth daily.    Marland Kitchen HYDROcodone-acetaminophen (NORCO/VICODIN) 5-325 MG tablet Take 1-2 tablets by mouth every 6 (six) hours as needed for moderate pain or severe pain. (Patient not taking: Reported on 12/01/2019) 15 tablet 0  . letrozole (FEMARA) 2.5 MG tablet Take 1 tablet (2.5 mg total) by mouth daily. 90 tablet 3  . lidocaine-prilocaine (EMLA) cream Apply to affected area once (Patient not taking: Reported on 12/01/2019) 30 g 3  . prochlorperazine (COMPAZINE) 10 MG tablet Take 1 tablet (10 mg total) by mouth every 6 (six) hours as needed  (Nausea or vomiting). (Patient not taking: Reported on 12/01/2019) 60 tablet 2  . traMADol (ULTRAM) 50 MG tablet Take 1-2 tablets (50-100 mg total) by mouth every 6 (six) hours as needed. (Patient not taking: Reported on 12/01/2019) 30 tablet 1   No current facility-administered medications for this visit.   Facility-Administered Medications Ordered in Other Visits  Medication Dose Route Frequency Provider Last Rate Last Admin  . heparin lock flush 100 unit/mL  500 Units Intravenous Once Lloyd Huger, MD      . sodium chloride flush (NS) 0.9 % injection 10 mL  10 mL Intravenous Once Grayland Ormond Kathlene November, MD      Graduate for companion yesterday dog  OBJECTIVE: Vitals:   12/02/19 1011  BP: 131/85  Pulse: 63  Temp: (!) 97.5 F (36.4 C)     Body mass index is 31.12 kg/m.    ECOG FS:0 -  Asymptomatic  General: Well-developed, well-nourished, no acute distress. Eyes: Pink conjunctiva, anicteric sclera. HEENT: Normocephalic, moist mucous membranes. Breast: Bilateral mastectomy. Lungs: No audible wheezing or coughing. Heart: Regular rate and rhythm. Abdomen: Soft, nontender, no obvious distention. Musculoskeletal: No edema, cyanosis, or clubbing. Neuro: Alert, answering all questions appropriately. Cranial nerves grossly intact. Skin: No rashes or petechiae noted. Psych: Normal affect.   LAB RESULTS:  Lab Results  Component Value Date   NA 138 11/11/2019   K 4.4 11/11/2019   CL 104 11/11/2019   CO2 25 11/11/2019   GLUCOSE 131 (H) 11/11/2019   BUN 24 (H) 11/11/2019   CREATININE 0.78 11/11/2019   CALCIUM 9.6 11/11/2019   PROT 6.2 (L) 09/30/2019   ALBUMIN 3.8 09/30/2019   AST 15 09/30/2019   ALT 13 09/30/2019   ALKPHOS 52 09/30/2019   BILITOT 0.4 09/30/2019   GFRNONAA >60 11/11/2019   GFRAA >60 11/11/2019    Lab Results  Component Value Date   WBC 4.3 09/30/2019   NEUTROABS 2.4 09/30/2019   HGB 10.3 (L) 09/30/2019   HCT 32.9 (L) 09/30/2019   MCV 89.4 09/30/2019    PLT 247 09/30/2019     STUDIES: NM Sentinel Node Inj-No Rpt (Breast)  Result Date: 11/17/2019 Sulfur colloid was injected by the nuclear medicine technologist for melanoma sentinel node.   MM Breast Surgical Specimen  Result Date: 11/17/2019 CLINICAL DATA:  Surgical excision of a previously biopsied metastatic right axillary lymph node. The patient is also having a right mastectomy for invasive mammary carcinoma. EXAM: SPECIMEN RADIOGRAPH OF THE RIGHT AXILLA COMPARISON:  Previous exam(s). FINDINGS: Status post excision of the right axilla. The radioactive seed and coil shaped biopsy marker clip are present, completely intact. There is also a surgical clip in the periphery of the specimen. IMPRESSION: Specimen radiograph of the right axilla. Electronically Signed   By: Claudie Revering M.D.   On: 11/17/2019 14:39   [place] RT RADIOACTIVE SEED LOC  Result Date: 11/16/2019 CLINICAL DATA:  Localization of a previously biopsied lymph node prior to surgery EXAM: ULTRASOUND GUIDED RADIOACTIVE SEED LOCALIZATION OF THE RIGHT BREAST COMPARISON:  Previous exam(s). FINDINGS: Patient presents for radioactive seed localization prior to surgery. I met with the patient and we discussed the procedure of seed localization including benefits and alternatives. We discussed the high likelihood of a successful procedure. We discussed the risks of the procedure including infection, bleeding, tissue injury and further surgery. We discussed the low dose of radioactivity involved in the procedure. Informed, written consent was given. The usual time-out protocol was performed immediately prior to the procedure. Using ultrasound guidance, sterile technique, 1% lidocaine and an I-125 radioactive seed, the previously biopsied lymph node was localized using a lateral approach. The follow-up mammogram images confirm the seed in the expected location and were marked for the surgeon. Follow-up survey of the patient confirms presence of the  radioactive seed. Order number of I-125 seed:  [id number]. Total activity:  6.644 millicuries reference Date: October 05, 2019 The patient tolerated the procedure well and was released from the [facility]. She was given instructions regarding seed removal. IMPRESSION: Radioactive seed localization right breast. No apparent complications. Electronically Signed   By: Dorise Bullion III M.D   On: 11/16/2019 13:21   MM CLIP PLACEMENT RIGHT  Result Date: 11/16/2019 CLINICAL DATA:  Localization of a previously biopsied lymph node prior to surgery EXAM: ULTRASOUND GUIDED RADIOACTIVE SEED LOCALIZATION OF THE RIGHT BREAST COMPARISON:  Previous exam(s). FINDINGS: Patient presents for radioactive seed  localization prior to surgery. I met with the patient and we discussed the procedure of seed localization including benefits and alternatives. We discussed the high likelihood of a successful procedure. We discussed the risks of the procedure including infection, bleeding, tissue injury and further surgery. We discussed the low dose of radioactivity involved in the procedure. Informed, written consent was given. The usual time-out protocol was performed immediately prior to the procedure. Using ultrasound guidance, sterile technique, 1% lidocaine and an I-125 radioactive seed, the previously biopsied lymph node was localized using a lateral approach. The follow-up mammogram images confirm the seed in the expected location and were marked for the surgeon. Follow-up survey of the patient confirms presence of the radioactive seed. Order number of I-125 seed:  [id number]. Total activity:  4.098 millicuries reference Date: October 05, 2019 The patient tolerated the procedure well and was released from the [facility]. She was given instructions regarding seed removal. IMPRESSION: Radioactive seed localization right breast. No apparent complications. Electronically Signed   By: Dorise Bullion III M.D   On: 11/16/2019 13:21     ASSESSMENT: Clinical stage IIa ER/PR positive, HER-2 negative invasive carcinoma of the central portion of the right breast.  Oncotype DX score 7.  PLAN:   1. Clinical stage IIa ER/PR positive, HER-2 negative invasive carcinoma of the central portion of the right breast: Although patient is clinical stage IIa and typically neoadjuvant chemotherapy is the recommendation, patient initially did not wish to pursue aggressive immunosuppressive treatment during the COVID-19 pandemic.  Instead, patient received neoadjuvant letrozole from April 2020 through August 2020.  She then proceeded with neoadjuvant chemotherapy using Adriamycin, Cytoxan with Udenyca support followed by weekly Taxol x12. Patient completed her chemotherapy on September 30, 2019. She subsequently underwent bilateral mastectomy on November 17, 2019. Residual disease was noted in the breast and patient had evidence of micrometastasis in one lymph node. Because of her bilateral mastectomy, she did not require adjuvant XRT. Continue letrozole for a minimum of 5 years through April 2025. Given her high risk disease, will likely extend treatment 7 to 10 years. Patient will require bone mineral density in the next 1 to 2 weeks. Return to clinic with a video assisted telemedicine visit in 3 months.  2.  COVID positivity: Resolved.  Patient reports repeat testing is negative. It is likely that the groundglass densities seen on her CT scan from March 17, 2019 are residual from her infection.  No intervention is needed at this time. 3.  Thyroid nodule: Biopsy reported as benign.  Likely multinodular goiter. 4.  Left breast nodule: Bilateral mastectomy as above. No evidence of malignancy noted. 5.  Anemia: Patient's last hemoglobin on September 30, 2019 was reported at 10.3.   Patient expressed understanding and was in agreement with this plan. She also understands that She can call clinic at any time with any questions, concerns, or complaints.   Cancer  Staging Cancer of central portion of right female breast [facility]) Staging form: Breast, AJCC 8th Edition - Clinical stage from 01/13/2019: Stage IIA (cT2, cN1, cM0, G1, ER+, PR+, HER2-) - Signed by Lloyd Huger, MD on 01/13/2019   Lloyd Huger, MD   12/02/2019 11:36 AM

## 2019-12-01 ENCOUNTER — Encounter: Payer: Self-pay | Admitting: Oncology

## 2019-12-02 ENCOUNTER — Other Ambulatory Visit: Payer: Self-pay

## 2019-12-02 ENCOUNTER — Inpatient Hospital Stay: Payer: Medicare PPO | Attending: Oncology | Admitting: Oncology

## 2019-12-02 VITALS — BP 131/85 | HR 63 | Temp 97.5°F | Wt 187.0 lb

## 2019-12-02 DIAGNOSIS — C773 Secondary and unspecified malignant neoplasm of axilla and upper limb lymph nodes: Secondary | ICD-10-CM | POA: Diagnosis not present

## 2019-12-02 DIAGNOSIS — Z79899 Other long term (current) drug therapy: Secondary | ICD-10-CM | POA: Insufficient documentation

## 2019-12-02 DIAGNOSIS — Z87891 Personal history of nicotine dependence: Secondary | ICD-10-CM | POA: Insufficient documentation

## 2019-12-02 DIAGNOSIS — C50111 Malignant neoplasm of central portion of right female breast: Secondary | ICD-10-CM | POA: Insufficient documentation

## 2019-12-02 DIAGNOSIS — Z170 Estrogen receptor positive status [ER+]: Secondary | ICD-10-CM | POA: Diagnosis not present

## 2019-12-02 DIAGNOSIS — Z79811 Long term (current) use of aromatase inhibitors: Secondary | ICD-10-CM | POA: Insufficient documentation

## 2019-12-02 MED ORDER — LETROZOLE 2.5 MG PO TABS: 2.5000 mg | ORAL_TABLET | Freq: Every day | ORAL | 3 refills | Status: DC

## 2019-12-09 ENCOUNTER — Other Ambulatory Visit: Payer: Self-pay | Admitting: Physician Assistant

## 2019-12-09 DIAGNOSIS — F4322 Adjustment disorder with anxiety: Secondary | ICD-10-CM

## 2019-12-09 NOTE — Telephone Encounter (Signed)
Requested  medications are  due for refill today yes  Requested medications are on the active medication list yes  Last refill 06/24/2019  Future visit scheduled no  Last visit: 06/2019  Notes to clinic Not Delegated

## 2019-12-10 ENCOUNTER — Encounter: Payer: Self-pay | Admitting: Rehabilitation

## 2019-12-10 ENCOUNTER — Other Ambulatory Visit: Payer: Self-pay

## 2019-12-10 ENCOUNTER — Ambulatory Visit: Payer: Medicare PPO | Attending: General Surgery | Admitting: Rehabilitation

## 2019-12-10 DIAGNOSIS — R600 Localized edema: Secondary | ICD-10-CM | POA: Insufficient documentation

## 2019-12-10 DIAGNOSIS — Z9013 Acquired absence of bilateral breasts and nipples: Secondary | ICD-10-CM | POA: Diagnosis present

## 2019-12-10 NOTE — Telephone Encounter (Signed)
LOV  08/04/19  LRF  03/29/19  90 x 1

## 2019-12-11 NOTE — Therapy (Signed)
[place], [place], [address] Phone: [phone]   Fax:  [phone]  Physical Therapy Evaluation  Patient Details  Name: Nichole Martinez MRN: [id number] Date of Birth: April 12, 1951 Referring Provider (PT): Dr. Marlou Starks   Encounter Date: 12/10/2019  PT End of Session - 12/10/19 1704    Visit Number  1    Number of Visits  9    Date for PT Re-Evaluation  01/14/20    PT Start Time  0800    PT Stop Time  0853    PT Time Calculation (min)  53 min    Activity Tolerance  Patient tolerated treatment well    Behavior During Therapy  [facility] for tasks assessed/performed       Past Medical History:  Diagnosis Date  . Allergy   . Breast cancer, right ([place]) 2020   Currently taking hormonal chemo tx's.   . Depression   . Diabetes mellitus without complication ([place]) 0000000  . Dyspnea    due to weight  . Hyperlipidemia   . Hypertension   . Osteoporosis   . Trigeminal neuralgia     Past Surgical History:  Procedure Laterality Date  . ABDOMINAL HYSTERECTOMY  1978  . BREAST BIOPSY Right 01/06/2019   [place] bx x 2.  Ribbon marker for mass and hydro for Lymph node. Path pending  . BREAST CYST EXCISION Right 1986  . BREAST EXCISIONAL BIOPSY    . IR IMAGING GUIDED PORT INSERTION  05/12/2019  . LAMINECTOMY  1995   L1, L2, L3  . MASTECTOMY WITH AXILLARY LYMPH NODE DISSECTION Bilateral 11/17/2019   Procedure: BILATERAL MASTECTOMIES WITH RIGHT TARGETED SEED NODE DISSECTION;  Surgeon: Jovita Kussmaul, MD;  Location: [facility];  Service: General;  Laterality: Bilateral;  . SENTINEL NODE BIOPSY Right 11/17/2019   Procedure: RIGHT SENTINEL NODE BIOPSY;  Surgeon: Jovita Kussmaul, MD;  Location: [facility];  Service: General;  Laterality: Right;  . SPINE SURGERY      There were no vitals filed for this visit.       [facility] PT Assessment - 12/11/19 0001      Assessment   Medical Diagnosis  Rt breast  cancer    Referring Provider (PT)  Dr. Marlou Starks    Onset Date/Surgical Date  11/17/19    Hand Dominance  Right      Precautions   Precaution Comments  lymphedema       Restrictions   Weight Bearing Restrictions  No      Home Environment   Living Environment  Private residence    Living Arrangements  Alone    Available Help at Discharge  Family    Type of [place]      Prior Function   Level of [place]  Retired    Loss adjuster, chartered    Leisure  waiting on knee replacement      Cognition   Overall Cognitive Status  Within Functional Limits for tasks assessed      Observation/Other Assessments   Observations  healing bil mastectomy incisions with some glue present on both sides but no drainage; wearing post op binder      Sensation   Additional Comments  able to see fluid move across the whole chest wall from lateral to midline bilateral pt reports that surgeon is aware of this but possibly not how large it is.  Posture/Postural Control   Posture/Postural Control  No significant limitations      AROM   Overall AROM Comments  some pull left nipple region with AROM    Right Shoulder Flexion  155 Degrees    Right Shoulder ABduction  174 Degrees    Right Shoulder Internal Rotation  70 Degrees    Right Shoulder External Rotation  85 Degrees    Right Shoulder Horizontal ABduction  30 Degrees    Left Shoulder Flexion  153 Degrees    Left Shoulder ABduction  172 Degrees    Left Shoulder Internal Rotation  70 Degrees    Left Shoulder External Rotation  85 Degrees    Left Shoulder Horizontal ABduction  30 Degrees      Special Tests   Other special tests  L-DEX performed with a baseline score of -1.6 which is within normal range        LYMPHEDEMA/ONCOLOGY QUESTIONNAIRE - 12/11/19 1701      Type   Cancer Type  Rt breast cancer      Surgeries   Mastectomy Date  11/17/19    Number Lymph Nodes Removed  5   1 positive      Treatment   Active Chemotherapy Treatment  No    Past Chemotherapy Treatment  Yes    Active Radiation Treatment  No    Past Radiation Treatment  No    Current Hormone Treatment  Yes    Drug Name  letrozole      What other symptoms do you have   Are you Having Heaviness or Tightness  Yes    Are you having Pain  No    Is it Hard or Difficult finding clothes that fit  No      Lymphedema Assessments   Lymphedema Assessments  Upper extremities      Right Upper Extremity Lymphedema   15 cm Proximal to Olecranon Process  32.9 cm    10 cm Proximal to Olecranon Process  32.2 cm    Olecranon Process  28.5 cm    15 cm Proximal to Ulnar Styloid Process  26.5 cm    10 cm Proximal to Ulnar Styloid Process  24.6 cm    Just Proximal to Ulnar Styloid Process  17.8 cm    Across Hand at [employer]  21.5 cm    At [place] of 2nd Digit  6.5 cm      Left Upper Extremity Lymphedema   15 cm Proximal to Olecranon Process  32 cm    10 cm Proximal to Olecranon Process  31.5 cm    Olecranon Process  27.8 cm    15 cm Proximal to Ulnar Styloid Process  26.5 cm    10 cm Proximal to Ulnar Styloid Process  24 cm    Just Proximal to Ulnar Styloid Process  18 cm    Across Hand at [employer]  21.5 cm    At [place] of 2nd Digit  6.7 cm             Objective measurements completed on examination: See above findings.      [place] Adult PT Treatment/Exercise - 12/11/19 0001      Self-Care   Self-Care  Other Self-Care Comments    Other Self-Care Comments   Discussed wearing compression binder or tank as much as possible and how to perform self MLD consisting of axillary circles bilaterally, inguinal circles bilaterally, axillo inguinal anatamoses and deep resisted breathing and to  let Dr. Marlou Starks check in in about 1 week if no changes in the chest space fluid to see if aspiriation is appropriate vs more PT for MLD.                    PT Long Term Goals - 12/10/19 1712      PT LONG TERM GOAL  #1   Title  Pt will be set with appropriate compression to manage chest edema    Time  5    Period  Weeks    Status  New      PT LONG TERM GOAL #2   Title  Pt will be educated on risk reduction strategies for lymphedema    Time  5    Period  Weeks    Status  New      PT LONG TERM GOAL #3   Title  Pt will demonstrate no fluid movement across the chest with lateral palpation bilaterally    Time  5    Period  Weeks    Status  New      PT LONG TERM GOAL #4   Title  Pt will be ind with final HEP    Time  5    Period  Weeks    Status  New             Plan - 12/10/19 1705    Clinical Impression Statement  Pt presents around 3 weeks post bilateral mastectomy and 5 lymph nodes removed on the Right with excellent ROM still with some pull on the left at the incision but not limited.  Minimal pain present overall.  Pt describing what seems more like post op edema in the Rt upper quadrant with an L-DEX score of -1.6 falling in a normal range for analysis of fluid composition of the Rt UE.  Pt does have significant seroma / general post op edema bilateral chest wall evident with wave like motion from lateral to medial on both sides.  pt is already wearing her post op binder and has compression on the way which pt was encouraged to wear as much as possible.  Pt was also instructed in basic self MLD to include breathing, axillary and inguinal circles and bilateral axillo inguinal pathways to assist with fluid drainage.  Pt will follow up with Dr. Marlou Starks in about 1 week to see how the chest wall edema is progressing and may return for MLD.  Pt is agreeable with this plan.    Personal Factors and Comorbidities  Age;Comorbidity 1    Comorbidities  SLNB    Examination-Participation Restrictions  Yard Work;Cleaning    Stability/Clinical Decision Making  Evolving/Moderate complexity    Clinical Decision Making  Moderate    Rehab Potential  Good    PT Frequency  2x / week    PT Duration  4 weeks    if indicated after next MD visit   PT Treatment/Interventions  ADLs/Self Care Home Management;Therapeutic exercise;Patient/family education;Manual techniques;Manual lymph drainage;Taping;Scar mobilization;Passive range of motion    PT Next Visit Plan  If pt returns assess chest wall edema and start MLD, may also return for exercise progression/strength ABC    Recommended Other Services  strength ABC class    Consulted and Agree with Plan of Care  Patient       Patient will benefit from skilled therapeutic intervention in order to improve the following deficits and impairments:  Decreased skin integrity, Decreased scar mobility, Increased edema  Visit Diagnosis: H/O bilateral mastectomy  Localized edema     Problem List Patient Active Problem List   Diagnosis Date Noted  . Cancer of right female breast ([place]) 11/17/2019  . Goals of care, counseling/discussion 04/29/2019  . Cancer of central portion of right female breast ([place]) 01/13/2019  . Migraine 10/04/2015  . Hyperlipemia 04/05/2015  . Allergic rhinitis 01/31/2015  . Arthritis of knee, degenerative 01/31/2015  . Cannot sleep 01/31/2015  . Avitaminosis D 01/31/2015  . Adiposity 02/24/2009  . Diabetes mellitus, type 2 ([place]) 03/07/2008  . Adaptation reaction 02/19/2008  . Trigeminal neuralgia 01/16/2007  . Acid reflux 03/09/2003  . BP (high blood pressure) 01/28/2003    Stark Bray 12/11/2019, 5:15 PM  [place], [place], [address] Phone: [phone]   Fax:  [phone]  Name: Nichole Martinez MRN: [id number] Date of Birth: [birth year]

## 2019-12-11 NOTE — Patient Instructions (Signed)
Wear post op binder or new garments coming in the mail as much as possible Schedule with [facility] if your garments do not work Start some self MLD and breathing as instructed Let Dr. Marlou Starks know about the amount of chest wall edema for direction

## 2019-12-14 ENCOUNTER — Ambulatory Visit
Admission: RE | Admit: 2019-12-14 | Discharge: 2019-12-14 | Disposition: A | Discharge status: Home / Self Care | Payer: Medicare PPO | Source: Ambulatory Visit | Attending: Oncology | Admitting: Oncology

## 2019-12-14 DIAGNOSIS — M85851 Other specified disorders of bone density and structure, right thigh: Secondary | ICD-10-CM | POA: Insufficient documentation

## 2019-12-14 DIAGNOSIS — C50111 Malignant neoplasm of central portion of right female breast: Secondary | ICD-10-CM | POA: Diagnosis present

## 2019-12-14 DIAGNOSIS — Z170 Estrogen receptor positive status [ER+]: Secondary | ICD-10-CM | POA: Insufficient documentation

## 2019-12-23 ENCOUNTER — Telehealth: Payer: Self-pay

## 2019-12-23 DIAGNOSIS — C50111 Malignant neoplasm of central portion of right female breast: Secondary | ICD-10-CM

## 2019-12-23 DIAGNOSIS — Z170 Estrogen receptor positive status [ER+]: Secondary | ICD-10-CM

## 2019-12-23 NOTE — Telephone Encounter (Signed)
Survivorship Care Plan visit completed.  Treatment summary reviewed and mailed to patient.  ASCO answers booklet reviewed and mailed to patient.  CARE program and Cancer Transitions discussed with patient along with other resources cancer center offers to patients and caregivers.  Pt signed up for Cancer Transitions.   Patient verbalized understanding.  SCP packet mailed.  Patient in agreement for APP to have a Virtual visit to introduce them to the Survivorship Clinic.  Encouraged patient to call for any questions or concerns.

## 2019-12-27 ENCOUNTER — Ambulatory Visit: Payer: Self-pay | Admitting: General Surgery

## 2019-12-30 ENCOUNTER — Encounter (HOSPITAL_BASED_OUTPATIENT_CLINIC_OR_DEPARTMENT_OTHER): Payer: Self-pay | Admitting: General Surgery

## 2019-12-30 ENCOUNTER — Other Ambulatory Visit: Payer: Self-pay

## 2020-01-05 ENCOUNTER — Inpatient Hospital Stay: Payer: Medicare PPO | Attending: Oncology | Admitting: Oncology

## 2020-01-05 DIAGNOSIS — C50111 Malignant neoplasm of central portion of right female breast: Secondary | ICD-10-CM

## 2020-01-05 DIAGNOSIS — Z170 Estrogen receptor positive status [ER+]: Secondary | ICD-10-CM | POA: Diagnosis not present

## 2020-01-05 NOTE — Progress Notes (Signed)
Survivorship Clinic Consult Note [facility]  [phone] [phone]  CLINIC:  Survivorship  REASON FOR VISIT:  Survivorship surveillance visit for patient with history of Breast Cancer   I connected with Nichole Martinez on 01/05/20 at  9:30 AM EDT by telephone visit and verified that I am speaking with the correct person using two identifiers.   I discussed the limitations, risks, security and privacy concerns of performing an evaluation and management service by telemedicine and the availability of in-person appointments. I also discussed with the patient that there may be a patient responsible charge related to this service. The patient expressed understanding and agreed to proceed.   Other persons participating in the visit and their role in the encounter: None  Patient's location: Home Provider's location: Office  BRIEF ONCOLOGIC HISTORY:  Oncology History  Cancer of central portion of right female breast ([place])  01/13/2019 Initial Diagnosis   Cancer of central portion of right female breast ([place])   01/13/2019 Cancer Staging   Staging form: Breast, AJCC 8th Edition - Clinical stage from 01/13/2019: Stage IIA (cT2, cN1, cM0, G1, ER+, PR+, HER2-) - Signed by Lloyd Huger, MD on 01/13/2019   05/13/2019 -  Chemotherapy   The patient had DOXOrubicin (ADRIAMYCIN) chemo injection 132 mg, 60 mg/m2 = 132 mg, Intravenous,  Once, 4 of 4 cycles Administration: 132 mg (05/13/2019), 132 mg (05/27/2019), 132 mg (06/10/2019), 132 mg (06/24/2019) palonosetron (ALOXI) injection 0.25 mg, 0.25 mg, Intravenous,  Once, 4 of 4 cycles Administration: 0.25 mg (05/13/2019), 0.25 mg (05/27/2019), 0.25 mg (06/10/2019), 0.25 mg (06/24/2019) pegfilgrastim-cbqv (UDENYCA) injection 6 mg, 6 mg, Subcutaneous, Once, 4 of 4 cycles Administration: 6 mg (05/14/2019), 6 mg (05/28/2019), 6 mg (06/11/2019), 6 mg (06/25/2019) cyclophosphamide (CYTOXAN) 1,320 mg in sodium chloride 0.9  % 250 mL chemo infusion, 600 mg/m2 = 1,320 mg, Intravenous,  Once, 4 of 4 cycles Administration: 1,320 mg (05/13/2019), 1,320 mg (05/27/2019), 1,320 mg (06/10/2019), 1,320 mg (06/24/2019) PACLitaxel (TAXOL) 174 mg in sodium chloride 0.9 % 250 mL chemo infusion (</= 64m/m2), 80 mg/m2 = 174 mg, Intravenous,  Once, 12 of 12 cycles Administration: 174 mg (07/08/2019), 174 mg (07/15/2019), 174 mg (07/22/2019), 174 mg (07/29/2019), 174 mg (08/05/2019), 174 mg (08/19/2019), 174 mg (08/26/2019), 174 mg (09/02/2019), 174 mg (09/09/2019), 174 mg (09/16/2019), 174 mg (09/23/2019), 174 mg (09/30/2019) fosaprepitant (EMEND) 150 mg, dexamethasone (DECADRON) 12 mg in sodium chloride 0.9 % 145 mL IVPB, , Intravenous,  Once, 4 of 4 cycles Administration:  (05/13/2019),  (05/27/2019),  (06/10/2019),  (06/24/2019)  for chemotherapy treatment.     INTERVAL HISTORY:  [name] a 69year old female with past medical history significant for hyperlipidemia, hypertension, migraine, GERD, type 2 diabetes and recently diagnosed with stage IIa ER/PR positive HER-2/neu negative right breast cancer.  She was diagnosed in April 2020 at the start of the pandemic and given the uncertainty declined neoadjuvant chemotherapy due to possibly becoming immunocompromised.  She proceeded with neoadjuvant letrozole from April through August 2020.  Proceeded with neoadjuvant chemotherapy with Adriamycin, Cytoxan and he did not have followed by 12 cycles of weekly Taxol.  She completed chemo on 09/30/2019.  Had bilateral mastectomy on 11/17/2019.  Residual disease was noted in the breast and patient had evidence of micrometastasis in one lymph node.  Because of her bilateral mastectomy, she did not require adjuvant XRT.  She will continue letrozole for a minimum of 5 years through April 2025.  Given her high risk disease, will likely  extend treatment for 7 to 10 years.  Patient has done well since chemo and double mastectomy.  She denies any concerns at  this time.  She has met with occupational therapy for lymphedema prevention.  She was given compression devices to manage chest edema and prevention of lymphedema.  ADDITIONAL REVIEW OF SYSTEMS:  Review of Systems  Constitutional: Negative.  Negative for chills, fever, malaise/fatigue and weight loss.  HENT: Negative for congestion, ear pain and tinnitus.   Eyes: Negative.  Negative for blurred vision and double vision.  Respiratory: Negative.  Negative for cough, sputum production and shortness of breath.   Cardiovascular: Negative.  Negative for chest pain, palpitations and leg swelling.  Gastrointestinal: Negative.  Negative for abdominal pain, constipation, diarrhea, nausea and vomiting.  Genitourinary: Negative for dysuria, frequency and urgency.  Musculoskeletal: Negative for back pain and falls.  Skin: Negative.  Negative for rash.  Neurological: Negative.  Negative for weakness and headaches.  Endo/Heme/Allergies: Negative.  Does not bruise/bleed easily.  Psychiatric/Behavioral: Negative.  Negative for depression. The patient is not nervous/anxious and does not have insomnia.      PAST MEDICAL & SURGICAL HISTORY:  Past Medical History:  Diagnosis Date  . Allergy   . Breast cancer, right ([place]) 2020   Currently taking hormonal chemo tx's.   . Depression   . Diabetes mellitus without complication ([place]) 5956  . Dyspnea    due to weight  . Hyperlipidemia   . Hypertension   . Osteoporosis   . Trigeminal neuralgia    Past Surgical History:  Procedure Laterality Date  . ABDOMINAL HYSTERECTOMY  1978  . BREAST BIOPSY Right 01/06/2019   [place] bx x 2.  Ribbon marker for mass and hydro for Lymph node. Path pending  . BREAST CYST EXCISION Right 1986  . BREAST EXCISIONAL BIOPSY    . IR IMAGING GUIDED PORT INSERTION  05/12/2019  . LAMINECTOMY  1995   L1, L2, L3  . MASTECTOMY WITH AXILLARY LYMPH NODE DISSECTION Bilateral 11/17/2019   Procedure: BILATERAL MASTECTOMIES WITH RIGHT TARGETED  SEED NODE DISSECTION;  Surgeon: Jovita Kussmaul, MD;  Location: [place];  Service: General;  Laterality: Bilateral;  . SENTINEL NODE BIOPSY Right 11/17/2019   Procedure: RIGHT SENTINEL NODE BIOPSY;  Surgeon: Jovita Kussmaul, MD;  Location: [place];  Service: General;  Laterality: Right;  . SPINE SURGERY      SOCIAL HISTORY:  None   CURRENT MEDICATIONS:  Current Outpatient Medications on File Prior to Visit  Medication Sig Dispense Refill  . ALPRAZolam (XANAX) 1 MG tablet TAKE 1 TABLET BY MOUTH AT BEDTIME AS NEEDED FOR ANXIETY 90 tablet 1  . amLODipine (NORVASC) 5 MG tablet Take 1 tablet (5 mg total) by mouth daily. 90 tablet 3  . baclofen (LIORESAL) 10 MG tablet Take 1 tablet (10 mg total) by mouth daily. 90 tablet 3  . bisoprolol-hydrochlorothiazide (ZIAC) 10-6.25 MG tablet Take 1 tablet by mouth 2 (two) times daily. 180 tablet 3  . buPROPion (WELLBUTRIN XL) 150 MG 24 hr tablet Take 1 tablet (150 mg total) by mouth 2 (two) times a day. 180 tablet 1  . Cholecalciferol (VITAMIN D3) 5000 units CAPS Take by mouth.    Marland Kitchen HYDROcodone-acetaminophen (NORCO/VICODIN) 5-325 MG tablet Take 1-2 tablets by mouth every 6 (six) hours as needed for moderate pain or severe pain. 15 tablet 0  . letrozole (FEMARA) 2.5 MG tablet Take 1 tablet (2.5 mg total) by mouth daily. 90 tablet 3  .  lidocaine-prilocaine (EMLA) cream Apply to affected area once 30 g 3  . lisinopril-hydrochlorothiazide (ZESTORETIC) 10-12.5 MG tablet Take 1 tablet by mouth daily. 90 tablet 3  . loratadine (CLARITIN) 10 MG tablet Take 1 tablet (10 mg total) by mouth daily as needed. 90 tablet 3  . meloxicam (MOBIC) 15 MG tablet Take 1 tablet (15 mg total) by mouth daily. 90 tablet 3  . metFORMIN (GLUCOPHAGE) 1000 MG tablet Take 1 tablet (1,000 mg total) by mouth 2 (two) times daily with a meal. 180 tablet 3  . MULTIPLE VITAMINS PO MULTIVITAMINS (Oral Tablet)  1 Every Day for 0 days  Quantity: 0.00;  Refills: 0    Ordered :17-Apr-2011  [name] ;  Started 24-February-2009 Active    . OMEGA 3-6-9 FATTY ACIDS PO Reported on 10/04/2015    . rizatriptan (MAXALT) 5 MG tablet TAKE ONE TABLET BY MOUTH AS NEEDED FOR MIGRAINE. MAY REPEAT IN 2 HOURS IF NEEDED. 10 tablet 11  . simvastatin (ZOCOR) 20 MG tablet Take 1 tablet (20 mg total) by mouth at bedtime. 90 tablet 3  . Vitamin D, Ergocalciferol, (DRISDOL) 1.25 MG (50000 UT) CAPS capsule Take 1 capsule (50,000 Units total) by mouth every 7 (seven) days. 12 capsule 4  . vitamin E 1000 UNIT capsule Take 1,000 Units by mouth daily.    . [DISCONTINUED] rizatriptan (MAXALT) 5 MG tablet TAKE 1 TABLET BY MOUTH AS NEEDED FOR MIGRAINE. MAY REPEAT IN 2 HOURS IF NEEDED. 10 tablet 0   Current Facility-Administered Medications on File Prior to Visit  Medication Dose Route Frequency Provider Last Rate Last Admin  . heparin lock flush 100 unit/mL  500 Units Intravenous Once Lloyd Huger, MD      . sodium chloride flush (NS) 0.9 % injection 10 mL  10 mL Intravenous Once Lloyd Huger, MD        ALLERGIES:  Allergies  Allergen Reactions  . Morphine Anaphylaxis    Anaphallaxis.    PHYSICAL EXAM:  Limited due to virtual platform  LABORATORY DATA:  Lab Results  Component Value Date   WBC 4.3 09/30/2019   HGB 10.3 (L) 09/30/2019   HCT 32.9 (L) 09/30/2019   MCV 89.4 09/30/2019   PLT 247 09/30/2019      Chemistry      Component Value Date/Time   NA 138 11/11/2019 0919   NA 139 03/29/2019 0928   K 4.4 11/11/2019 0919   CL 104 11/11/2019 0919   CO2 25 11/11/2019 0919   BUN 24 (H) 11/11/2019 0919   BUN 21 03/29/2019 0928   CREATININE 0.78 11/11/2019 0919   GLU 111 04/27/2014 0000      Component Value Date/Time   CALCIUM 9.6 11/11/2019 0919   ALKPHOS 52 09/30/2019 0816   AST 15 09/30/2019 0816   ALT 13 09/30/2019 0816   BILITOT 0.4 09/30/2019 0816   BILITOT 0.3 03/29/2019 0928      DIAGNOSTIC IMAGING:  Mammogram 10/27/2019  IMPRESSION: 1.  Decreased size of known RIGHT breast cancer, now measuring 1.3 x 1.5 x 1.4 cm and less confluent. This mass previously measured 2.1 x 1.6 x 1.7 cm. 2. Decreased size of RIGHT axillary lymph node with known biopsy-proven metastasis, now normal in appearance except for biopsy clip artifact. 3. No suspicious LEFT breast findings. The previously identified mass within the OUTER LEFT breast is not visualized. No further imaging follow-up of this area is recommended.  RECOMMENDATION: Treatment plan  BI-RADS CATEGORY  6: Known biopsy-proven malignancy.  ASSESSMENT & PLAN:  Nichole Martinez is a pleasant 69 y.o. female with history of stage IIa ER/PR positive HER-2/neu negative breast cancer status post double mastectomy by Dr. Marlou Starks.  She completed neoadjuvant chemotherapy using Adriamycin, Cytoxan with the Udenyca support followed by weekly Taxol x2 September 30, 2019.  Had bilateral mastectomy on November 17, 2019.  Residual disease was noted on the breast and patient had evidence of micrometastasis in one lymph node.  She did not require adjuvant XRT. She was started on letrozole and will be on it for 5 to 10 years.  She presents to survivorship clinic today for survivorship care plan visit and to address any acute survivorship concerns since completing treatment.    1. History of stage IIa breast cancer: Today, she received a copy of her survivorship care plan (SCP) document, which was reviewed with her in detail.  The SCP details her cancer treatment history and potential late/long-term side effects of those treatments.  We discussed the follow-up schedule she can anticipate with interval imaging for surveillance of her cancer.  I have also shared a copy of her treatment summary/SCP with her PCP.  Nichole Martinez will return to the survivorship clinic as needed; she will return to [place] [facility] for surveillance visit with Dr. Grayland Ormond in June 2021.  2. Problem at visit: None  3. Smoking  cessation: I commended Nichole Martinez continued efforts to remain tobacco-free.  We discussed that one of the most important risk reduction strategies in preventing cancer recurrence in lung cancer patients is smoking cessation.  He is committed to abstaining from tobacco.  4. Physical activity/Healthy eating: Getting adequate physical activity and maintaining a healthy diet as a cancer survivor is important for overall wellness and reduces the risk of cancer recurrence. We discussed the CARE program which is a fitness program that is offered to cancer survivors free of charge.  We also reviewed the American Cancer Society's booklet with recommendations for nutrition and physical activity.    4. Health promotion/Cancer screening:  Nichole Martinez is reportedly up-to-date on her colonoscopy, pap smear,  skin screenings, and vaccinations.  I encouraged her to talk with his PCP about arranging appropriate cancer screening tests, as appropriate.   5. Support services/Counseling: Nichole Martinez was seen today in in effort to address both the physical and social concerns of our cancer survivors at [facility]. It is not uncommon for this period of the patient's cancer care trajectory to be one of many emotions and stressors.  I provided support today through active listening, validation of concerns, and expressive supportive counseling.  Nichole Martinez was encouraged to take advantage of our support services programs and support groups to better cope in her new life as a cancer survivor after completing anti-cancer treatment.   NCCN Guidelines and Recommendations:  NCCN guideline recommendations for invasive breast cancer ER/PR positive HER-2/neu negative disease  1.  Interval history and physical exam 1-4 times per year as clinically appropriate for 5 years, then annually.  2.  Mammogram every 12 months.  3, endocrine therapy: Assess and encourage adherence; patients taking tamoxifen should have  annual gynecological  assessment.   4.  Patient is on an AI or those who experienced ovarian failure secondary to treatment should have monitoring of bone health with a bone mineral density at baseline and periodically thereafter.  Dispo:  -RTC for follow-up with oncologist Dr. Grayland Ormond in June 2021.  A total of 30 minutes was spent in face-to-face  care of this patient, with greater than 50% of that time spent in counseling and care coordination.    Rulon Abide, AGNP-C [place] [facility] [place] (office) 01/05/20 9:29 AM

## 2020-01-06 ENCOUNTER — Other Ambulatory Visit (HOSPITAL_COMMUNITY): Payer: Medicare PPO

## 2020-02-11 ENCOUNTER — Ambulatory Visit: Payer: Medicare PPO | Admitting: Physician Assistant

## 2020-02-11 ENCOUNTER — Other Ambulatory Visit: Payer: Self-pay

## 2020-02-11 ENCOUNTER — Encounter: Payer: Self-pay | Admitting: Physician Assistant

## 2020-02-11 VITALS — BP 119/74 | HR 67 | Temp 97.1°F | Resp 16 | Wt 187.2 lb

## 2020-02-11 DIAGNOSIS — Z853 Personal history of malignant neoplasm of breast: Secondary | ICD-10-CM

## 2020-02-11 DIAGNOSIS — M85851 Other specified disorders of bone density and structure, right thigh: Secondary | ICD-10-CM

## 2020-02-11 MED ORDER — ALENDRONATE SODIUM 70 MG PO TABS: 70.0000 mg | ORAL_TABLET | ORAL | 0 refills | Status: DC

## 2020-02-11 NOTE — Patient Instructions (Signed)
Alendronate tablets What is this medicine? ALENDRONATE (a LEN droe nate) slows calcium loss from bones. It helps to make normal healthy bone and to slow bone loss in people with Paget's disease and osteoporosis. It may be used in others at risk for bone loss. This medicine may be used for other purposes; ask your health care provider or pharmacist if you have questions. COMMON BRAND NAME(S): Fosamax What should I tell my health care provider before I take this medicine? They need to know if you have any of these conditions:  dental disease  esophagus, stomach, or intestine problems, like acid reflux or GERD  kidney disease  low blood calcium  low vitamin D  problems sitting or standing 30 minutes  trouble swallowing  an unusual or allergic reaction to alendronate, other medicines, foods, dyes, or preservatives  pregnant or trying to get pregnant  breast-feeding How should I use this medicine? You must take this medicine exactly as directed or you will lower the amount of the medicine you absorb into your body or you may cause yourself harm. Take this medicine by mouth first thing in the morning, after you are up for the day. Do not eat or drink anything before you take your medicine. Swallow the tablet with a full glass (6 to 8 fluid ounces) of plain water. Do not take this medicine with any other drink. Do not chew or crush the tablet. After taking this medicine, do not eat breakfast, drink, or take any medicines or vitamins for at least 30 minutes. Sit or stand up for at least 30 minutes after you take this medicine; do not lie down. Do not take your medicine more often than directed. Talk to your pediatrician regarding the use of this medicine in children. Special care may be needed. Overdosage: If you think you have taken too much of this medicine contact a poison control center or emergency room at once. NOTE: This medicine is only for you. Do not share this medicine with  others. What if I miss a dose? If you miss a dose, do not take it later in the day. Continue your normal schedule starting the next morning. Do not take double or extra doses. What may interact with this medicine?  aluminum hydroxide  antacids  aspirin  calcium supplements  drugs for inflammation like ibuprofen, naproxen, and others  iron supplements  magnesium supplements  vitamins with minerals This list may not describe all possible interactions. Give your health care provider a list of all the medicines, herbs, non-prescription drugs, or dietary supplements you use. Also tell them if you smoke, drink alcohol, or use illegal drugs. Some items may interact with your medicine. What should I watch for while using this medicine? Visit your doctor or health care professional for regular checks ups. It may be some time before you see benefit from this medicine. Do not stop taking your medicine except on your doctor's advice. Your doctor or health care professional may order blood tests and other tests to see how you are doing. You should make sure you get enough calcium and vitamin D while you are taking this medicine, unless your doctor tells you not to. Discuss the foods you eat and the vitamins you take with your health care professional. Some people who take this medicine have severe bone, joint, and/or muscle pain. This medicine may also increase your risk for a broken thigh bone. Tell your doctor right away if you have pain in your upper leg or  groin. Tell your doctor if you have any pain that does not go away or that gets worse. This medicine can make you more sensitive to the sun. If you get a rash while taking this medicine, sunlight may cause the rash to get worse. Keep out of the sun. If you cannot avoid being in the sun, wear protective clothing and use sunscreen. Do not use sun lamps or tanning beds/booths. What side effects may I notice from receiving this medicine? Side effects  that you should report to your doctor or health care professional as soon as possible:  allergic reactions like skin rash, itching or hives, swelling of the face, lips, or tongue  black or tarry stools  bone, muscle or joint pain  changes in vision  chest pain  heartburn or stomach pain  jaw pain, especially after dental work  pain or trouble when swallowing  redness, blistering, peeling or loosening of the skin, including inside the mouth Side effects that usually do not require medical attention (report to your doctor or health care professional if they continue or are bothersome):  changes in taste  diarrhea or constipation  eye pain or itching  headache  nausea or vomiting  stomach gas or fullness This list may not describe all possible side effects. Call your doctor for medical advice about side effects. You may report side effects to FDA at 1-800-FDA-1088. Where should I keep my medicine? Keep out of the reach of children. Store at room temperature of 15 and 30 degrees C (59 and 86 degrees F). Throw away any unused medicine after the expiration date. NOTE: This sheet is a summary. It may not cover all possible information. If you have questions about this medicine, talk to your doctor, pharmacist, or health care provider.  2020 Elsevier/Gold Standard (2011-03-01 08:56:09)  

## 2020-02-11 NOTE — Progress Notes (Signed)
Established patient visit   Patient: Nichole Martinez   DOB: [birth year]   69 y.o. Female  MRN: [id number] Visit Date: 02/11/2020  Today's healthcare provider: [name], PA-C   Chief Complaint  Patient presents with  . Follow-up    Bone Density   Subjective    HPI Patient coming in to discuss bone density results. Had bone denisty in March 2021 with Oncology, Dr. Grayland Ormond. She was diagnosed with breast cancer and has underwent some chemotherapy and mastectomy. She did not require radiation. She is on Femara (letrozole) for the next 5 years and was deemed cancer-free in March 2021.   Patient Active Problem List   Diagnosis Date Noted  . Cancer of right female breast ([place]) 11/17/2019  . Goals of care, counseling/discussion 04/29/2019  . Cancer of central portion of right female breast ([place]) 01/13/2019  . Migraine 10/04/2015  . Hyperlipemia 04/05/2015  . Allergic rhinitis 01/31/2015  . Arthritis of knee, degenerative 01/31/2015  . Cannot sleep 01/31/2015  . Avitaminosis D 01/31/2015  . Adiposity 02/24/2009  . Diabetes mellitus, type 2 ([place]) 03/07/2008  . Adaptation reaction 02/19/2008  . Trigeminal neuralgia 01/16/2007  . Acid reflux 03/09/2003  . BP (high blood pressure) 01/28/2003   Past Medical History:  Diagnosis Date  . Allergy   . Breast cancer, right ([place]) 2020   Currently taking hormonal chemo tx's.   . Depression   . Diabetes mellitus without complication ([place]) 0000000  . Dyspnea    due to weight  . Hyperlipidemia   . Hypertension   . Osteoporosis   . Trigeminal neuralgia       Medications: Outpatient Medications Prior to Visit  Medication Sig  . ALPRAZolam (XANAX) 1 MG tablet TAKE 1 TABLET BY MOUTH AT BEDTIME AS NEEDED FOR ANXIETY  . amLODipine (NORVASC) 5 MG tablet Take 1 tablet (5 mg total) by mouth daily.  . baclofen (LIORESAL) 10 MG tablet Take 1 tablet (10 mg total) by mouth daily.  . bisoprolol-hydrochlorothiazide (ZIAC) 10-6.25 MG  tablet Take 1 tablet by mouth 2 (two) times daily.  Marland Kitchen buPROPion (WELLBUTRIN XL) 150 MG 24 hr tablet Take 1 tablet (150 mg total) by mouth 2 (two) times a day.  . Cholecalciferol (VITAMIN D3) 5000 units CAPS Take by mouth.  . letrozole (FEMARA) 2.5 MG tablet Take 1 tablet (2.5 mg total) by mouth daily.  Marland Kitchen lisinopril-hydrochlorothiazide (ZESTORETIC) 10-12.5 MG tablet Take 1 tablet by mouth daily.  Marland Kitchen loratadine (CLARITIN) 10 MG tablet Take 1 tablet (10 mg total) by mouth daily as needed.  . meloxicam (MOBIC) 15 MG tablet Take 1 tablet (15 mg total) by mouth daily.  . metFORMIN (GLUCOPHAGE) 1000 MG tablet Take 1 tablet (1,000 mg total) by mouth 2 (two) times daily with a meal.  . MULTIPLE VITAMINS PO MULTIVITAMINS (Oral Tablet)  1 Every Day for 0 days  Quantity: 0.00;  Refills: 0   Ordered :17-Apr-2011  [name] ;  Started 24-February-2009 Active  . OMEGA 3-6-9 FATTY ACIDS PO Reported on 10/04/2015  . rizatriptan (MAXALT) 5 MG tablet TAKE ONE TABLET BY MOUTH AS NEEDED FOR MIGRAINE. MAY REPEAT IN 2 HOURS IF NEEDED.  Marland Kitchen simvastatin (ZOCOR) 20 MG tablet Take 1 tablet (20 mg total) by mouth at bedtime.  . Vitamin D, Ergocalciferol, (DRISDOL) 1.25 MG (50000 UT) CAPS capsule Take 1 capsule (50,000 Units total) by mouth every 7 (seven) days.  . vitamin E 1000 UNIT capsule Take 1,000 Units by mouth daily.  . [  DISCONTINUED] HYDROcodone-acetaminophen (NORCO/VICODIN) 5-325 MG tablet Take 1-2 tablets by mouth every 6 (six) hours as needed for moderate pain or severe pain.  . [DISCONTINUED] lidocaine-prilocaine (EMLA) cream Apply to affected area once   Facility-Administered Medications Prior to Visit  Medication Dose Route Frequency Provider  . heparin lock flush 100 unit/mL  500 Units Intravenous Once Lloyd Huger, MD  . sodium chloride flush (NS) 0.9 % injection 10 mL  10 mL Intravenous Once Lloyd Huger, MD    Review of Systems  Constitutional: Negative.   Respiratory: Negative.     Cardiovascular: Negative.   Neurological: Negative.   Psychiatric/Behavioral: Negative.     Last CBC Lab Results  Component Value Date   WBC 4.3 09/30/2019   HGB 10.3 (L) 09/30/2019   HCT 32.9 (L) 09/30/2019   MCV 89.4 09/30/2019   MCH 28.0 09/30/2019   RDW 14.7 09/30/2019   PLT 247 0000000   Last metabolic panel Lab Results  Component Value Date   GLUCOSE 131 (H) 11/11/2019   NA 138 11/11/2019   K 4.4 11/11/2019   CL 104 11/11/2019   CO2 25 11/11/2019   BUN 24 (H) 11/11/2019   CREATININE 0.78 11/11/2019   GFRNONAA >60 11/11/2019   GFRAA >60 11/11/2019   CALCIUM 9.6 11/11/2019   PROT 6.2 (L) 09/30/2019   ALBUMIN 3.8 09/30/2019   LABGLOB 1.8 03/29/2019   AGRATIO 2.2 03/29/2019   BILITOT 0.4 09/30/2019   ALKPHOS 52 09/30/2019   AST 15 09/30/2019   ALT 13 09/30/2019   ANIONGAP 9 11/11/2019      Objective    BP 119/74 (BP Location: Left Arm, Patient Position: Sitting, Cuff Size: Normal)   Pulse 67   Temp (!) 97.1 F (36.2 C) (Temporal)   Resp 16   Wt 187 lb 3.2 oz (84.9 kg)   BMI 32.13 kg/m  BP Readings from Last 3 Encounters:  02/11/20 119/74  12/02/19 131/85  11/18/19 115/73   Wt Readings from Last 3 Encounters:  02/11/20 187 lb 3.2 oz (84.9 kg)  12/02/19 187 lb (84.8 kg)  11/17/19 192 lb 0.3 oz (87.1 kg)      Physical Exam Vitals reviewed.  Constitutional:      Appearance: Normal appearance. She is well-developed.  HENT:     Head: Normocephalic and atraumatic.  Pulmonary:     Effort: Pulmonary effort is normal. No respiratory distress.  Musculoskeletal:     Cervical back: Normal range of motion and neck supple.  Neurological:     Mental Status: She is alert.  Psychiatric:        Mood and Affect: Mood normal.        Behavior: Behavior normal.        Thought Content: Thought content normal.        Judgment: Judgment normal.     EXAM: DUAL X-RAY ABSORPTIOMETRY (DXA) FOR BONE MINERAL DENSITY  IMPRESSION: Your patient Nichole Martinez  completed a BMD test on 12/14/2019 using the [place] (software version: 14.10) manufactured by [employer]. The following summarizes the results of our evaluation. Technologist: SCE PATIENT BIOGRAPHICAL: Name: Nichole, Martinez Patient ID: [id number] Birth Date: [birth year] Height: 63.5 in. Gender: Female Exam Date: 12/14/2019 Weight: 190.2 lbs. Indications: Breast CA, Caucasian, Diabetic, Height Loss, High Risk Meds, History of Chemo, History of Spinal Surgery, Hysterectomy, Osteoarthritis, Postmenopausal Fractures: Treatments: Calcium, Claritin, Femara, meloxicam, Metformin, Multi-Vitamin, Vitamin D DENSITOMETRY RESULTS: Site      Region  Measured Date Measured Age WHO Classification Young Adult T-score BMD         %Change vs. Previous Significant Change (*) AP Spine L1-L2 12/14/2019 66.2 Normal -0.3 1.133 g/cm2 - -  DualFemur Total Right 12/14/2019 66.2 Osteopenia -1.9 0.765 g/cm2 - - ASSESSMENT: The BMD measured at Femur Total Right is 0.765 g/cm2 with a T-score of -1.9. This patient is considered osteopenic according to [facility]) criteria. The scan quality is good. L3 and L4 was excluded due to degenerative changes.  World Pharmacologist [facility]) criteria for post-menopausal, Caucasian Women: Normal:                   T-score at or above -1 SD Osteopenia/low bone mass: T-score between -1 and -2.5 SD Osteoporosis:             T-score at or below -2.5 SD  RECOMMENDATIONS: 1. All patients should optimize calcium and vitamin D intake. 2. Consider FDA-approved medical therapies in postmenopausal women and men aged 40 years and older, based on the following: a. A hip or vertebral(clinical or morphometric) fracture b. T-score < -2.5 at the femoral neck or spine after appropriate evaluation to exclude secondary causes c. Low bone mass (T-score between -1.0 and -2.5 at the femoral neck or spine) and a 10-year probability  of a hip fracture > 3% or a 10-year probability of a major osteoporosis-related fracture > 20% based on the [place]-adapted WHO algorithm 3. Clinician judgment and/or patient preferences may indicate treatment for people with 10-year fracture probabilities above or below these levels  FOLLOW-UP:  People with diagnosed cases of osteoporosis or at high risk for fracture should have regular bone mineral density tests. For patients eligible for Medicare, routine testing is allowed once every 2 years. The testing frequency can be increased to one year for patients who have rapidly progressing disease, those who are receiving or discontinuing medical therapy to restore bone mass, or have additional risk factors.  I have reviewed this report, and agree with the above findings.  [facility] Dear Dr Grayland Ormond,  Your patient [name] completed a FRAX assessment on 12/14/2019 using the [place] (analysis version: 14.10) manufactured by [employer]. The following summarizes the results of our evaluation.  PATIENT BIOGRAPHICAL: Name: Aabha, Klosky Patient ID: [id number] Birth Date: [birth year] Height:    63.5 in. Gender:     Female    [age]       Weight:    190.2 lbs. Ethnicity:  White                            Exam Date: 12/14/2019  FRAX* RESULTS:  (version: 3.5) 10-year Probability of Fracture1 Major Osteoporotic Fracture2 Hip Fracture 8.9% 1.0% Population: [place] (Caucasian) Risk Factors: None  Based on Femur (Left) Neck BMD  1 -The 10-year probability of fracture may be lower than reported if the patient has received treatment. 2 -Major Osteoporotic Fracture: Clinical Spine, Forearm, Hip or Shoulder  *FRAX is a Materials engineer of the [employer] for Metabolic Bone Disease, a [place] (WHO) [facility].  ASSESSMENT: The probability of a major osteoporotic fracture  is 8.9% within the next ten years.  The probability of a hip fracture is 1.0% within the next ten years.  Electronically Signed   By: Lowella Grip III M.D.   On:  12/14/2019 13:37  No results found for any visits on 02/11/20.  Assessment & Plan     1. Osteopenia of neck of right femur Patient was found to be osteopenic in the right femur. Lumbar spine was normal. Patient desires to be more aggressive since she will be on an aromatase inhibitor for the next 5 years. Will start with Fosamax as below to make sure patient tolerates the bisphosphonate class. If tolerating will change to once monthly Boniva. She is in agreement. Will repeat BMD in one year.  - alendronate (FOSAMAX) 70 MG tablet; Take 1 tablet (70 mg total) by mouth once a week. Take with a full glass of water on an empty stomach.  Dispense: 4 tablet; Refill: 0  2. Personal history of breast cancer See above medical treatment plan. - alendronate (FOSAMAX) 70 MG tablet; Take 1 tablet (70 mg total) by mouth once a week. Take with a full glass of water on an empty stomach.  Dispense: 4 tablet; Refill: 0'  Return if symptoms worsen or fail to improve.      Reynolds Bowl, PA-C, have reviewed all documentation for this visit. The documentation on 02/11/20 for the exam, diagnosis, procedures, and orders are all accurate and complete.   [name]  [facility] [phone] (phone) [phone] (fax)  [place]

## 2020-02-18 ENCOUNTER — Encounter (HOSPITAL_BASED_OUTPATIENT_CLINIC_OR_DEPARTMENT_OTHER): Payer: Self-pay | Admitting: General Surgery

## 2020-02-22 ENCOUNTER — Encounter (HOSPITAL_BASED_OUTPATIENT_CLINIC_OR_DEPARTMENT_OTHER)
Admission: RE | Admit: 2020-02-22 | Discharge: 2020-02-22 | Disposition: A | Discharge status: Home / Self Care | Payer: Medicare PPO | Source: Ambulatory Visit

## 2020-02-22 ENCOUNTER — Other Ambulatory Visit (HOSPITAL_COMMUNITY)
Admission: RE | Admit: 2020-02-22 | Discharge: 2020-02-22 | Disposition: A | Discharge status: Home / Self Care | Payer: Medicare PPO | Source: Ambulatory Visit | Attending: General Surgery | Admitting: General Surgery

## 2020-02-22 DIAGNOSIS — Z20822 Contact with and (suspected) exposure to covid-19: Secondary | ICD-10-CM | POA: Insufficient documentation

## 2020-02-22 DIAGNOSIS — Z79899 Other long term (current) drug therapy: Secondary | ICD-10-CM | POA: Diagnosis not present

## 2020-02-22 DIAGNOSIS — F419 Anxiety disorder, unspecified: Secondary | ICD-10-CM | POA: Diagnosis not present

## 2020-02-22 DIAGNOSIS — Z791 Long term (current) use of non-steroidal anti-inflammatories (NSAID): Secondary | ICD-10-CM | POA: Diagnosis not present

## 2020-02-22 DIAGNOSIS — Z01812 Encounter for preprocedural laboratory examination: Secondary | ICD-10-CM | POA: Insufficient documentation

## 2020-02-22 DIAGNOSIS — Z853 Personal history of malignant neoplasm of breast: Secondary | ICD-10-CM | POA: Diagnosis not present

## 2020-02-22 DIAGNOSIS — M96843 Postprocedural seroma of a musculoskeletal structure following other procedure: Secondary | ICD-10-CM | POA: Diagnosis not present

## 2020-02-22 DIAGNOSIS — I10 Essential (primary) hypertension: Secondary | ICD-10-CM | POA: Diagnosis not present

## 2020-02-22 DIAGNOSIS — M1990 Unspecified osteoarthritis, unspecified site: Secondary | ICD-10-CM | POA: Diagnosis not present

## 2020-02-22 DIAGNOSIS — Z452 Encounter for adjustment and management of vascular access device: Secondary | ICD-10-CM | POA: Diagnosis present

## 2020-02-22 DIAGNOSIS — Z87891 Personal history of nicotine dependence: Secondary | ICD-10-CM | POA: Diagnosis not present

## 2020-02-22 DIAGNOSIS — Z7984 Long term (current) use of oral hypoglycemic drugs: Secondary | ICD-10-CM | POA: Diagnosis not present

## 2020-02-22 DIAGNOSIS — E119 Type 2 diabetes mellitus without complications: Secondary | ICD-10-CM | POA: Diagnosis not present

## 2020-02-22 DIAGNOSIS — Z885 Allergy status to narcotic agent status: Secondary | ICD-10-CM | POA: Diagnosis not present

## 2020-02-22 DIAGNOSIS — E7800 Pure hypercholesterolemia, unspecified: Secondary | ICD-10-CM | POA: Diagnosis not present

## 2020-02-22 DIAGNOSIS — Z9013 Acquired absence of bilateral breasts and nipples: Secondary | ICD-10-CM | POA: Diagnosis not present

## 2020-02-22 LAB — BASIC METABOLIC PANEL
Anion gap: 8 (ref 5–15)
BUN: 19 mg/dL (ref 8–23)
CO2: 27 mmol/L (ref 22–32)
Calcium: 9.6 mg/dL (ref 8.9–10.3)
Chloride: 104 mmol/L (ref 98–111)
Creatinine, Ser: 0.79 mg/dL (ref 0.44–1.00)
GFR calc Af Amer: >60 mL/min (ref >60)
GFR calc non Af Amer: >60 mL/min (ref >60)
Glucose, Bld: 120 mg/dL — ABNORMAL HIGH (ref 70–99)
Potassium: 4.8 mmol/L (ref 3.5–5.1)
Sodium: 139 mmol/L (ref 135–145)

## 2020-02-22 LAB — SARS CORONAVIRUS 2 (TAT 6-24 HRS): SARS Coronavirus 2: NEGATIVE

## 2020-02-22 NOTE — Progress Notes (Signed)

## 2020-02-24 NOTE — Anesthesia Preprocedure Evaluation (Addendum)
Anesthesia Evaluation  Patient identified by MRN, date of birth, ID band Patient awake    Reviewed: Allergy & Precautions, NPO status , Patient's Chart, lab work & pertinent test results, reviewed documented beta blocker date and time   Airway Mallampati: II  TM Distance: >3 FB Neck ROM: Full    Dental  (+) Missing   Pulmonary former smoker,    Pulmonary exam normal breath sounds clear to auscultation       Cardiovascular hypertension, Pt. on medications and Pt. on home beta blockers Normal cardiovascular exam Rhythm:Regular Rate:Normal     Neuro/Psych  Headaches, PSYCHIATRIC DISORDERS Depression    GI/Hepatic negative GI ROS, Neg liver ROS,   Endo/Other  diabetes, Oral Hypoglycemic Agents  Renal/GU negative Renal ROS     Musculoskeletal negative musculoskeletal ROS (+)   Abdominal (+) + obese,   Peds  Hematology HLD   Anesthesia Other Findings history of breast cancer  Reproductive/Obstetrics                            Anesthesia Physical Anesthesia Plan  ASA: III  Anesthesia Plan: MAC   Post-op Pain Management:    Induction: Intravenous  PONV Risk Score and Plan: 2 and Ondansetron, Dexamethasone, Propofol infusion, Treatment may vary due to age or medical condition and Midazolam  Airway Management Planned: Simple Face Mask  Additional Equipment:   Intra-op Plan:   Post-operative Plan:   Informed Consent:     Dental advisory given  Plan Discussed with: CRNA  Anesthesia Plan Comments:        Anesthesia Quick Evaluation

## 2020-02-25 ENCOUNTER — Ambulatory Visit (HOSPITAL_BASED_OUTPATIENT_CLINIC_OR_DEPARTMENT_OTHER): Payer: Medicare PPO | Admitting: Anesthesiology

## 2020-02-25 ENCOUNTER — Encounter (HOSPITAL_BASED_OUTPATIENT_CLINIC_OR_DEPARTMENT_OTHER): Payer: Self-pay | Admitting: General Surgery

## 2020-02-25 ENCOUNTER — Ambulatory Visit (HOSPITAL_BASED_OUTPATIENT_CLINIC_OR_DEPARTMENT_OTHER)
Admission: RE | Admit: 2020-02-25 | Discharge: 2020-02-25 | Disposition: A | Discharge status: Home / Self Care | Payer: Medicare PPO | Attending: General Surgery | Admitting: General Surgery

## 2020-02-25 ENCOUNTER — Encounter (HOSPITAL_BASED_OUTPATIENT_CLINIC_OR_DEPARTMENT_OTHER)
Admission: RE | Disposition: A | Discharge status: Home / Self Care | Payer: Self-pay | Source: Home / Self Care | Attending: General Surgery

## 2020-02-25 DIAGNOSIS — Z9013 Acquired absence of bilateral breasts and nipples: Secondary | ICD-10-CM | POA: Insufficient documentation

## 2020-02-25 DIAGNOSIS — Z791 Long term (current) use of non-steroidal anti-inflammatories (NSAID): Secondary | ICD-10-CM | POA: Insufficient documentation

## 2020-02-25 DIAGNOSIS — M96843 Postprocedural seroma of a musculoskeletal structure following other procedure: Secondary | ICD-10-CM | POA: Insufficient documentation

## 2020-02-25 DIAGNOSIS — Z79899 Other long term (current) drug therapy: Secondary | ICD-10-CM | POA: Insufficient documentation

## 2020-02-25 DIAGNOSIS — Z853 Personal history of malignant neoplasm of breast: Secondary | ICD-10-CM | POA: Insufficient documentation

## 2020-02-25 DIAGNOSIS — F419 Anxiety disorder, unspecified: Secondary | ICD-10-CM | POA: Insufficient documentation

## 2020-02-25 DIAGNOSIS — Z7984 Long term (current) use of oral hypoglycemic drugs: Secondary | ICD-10-CM | POA: Insufficient documentation

## 2020-02-25 DIAGNOSIS — Z885 Allergy status to narcotic agent status: Secondary | ICD-10-CM | POA: Insufficient documentation

## 2020-02-25 DIAGNOSIS — I10 Essential (primary) hypertension: Secondary | ICD-10-CM | POA: Insufficient documentation

## 2020-02-25 DIAGNOSIS — M1990 Unspecified osteoarthritis, unspecified site: Secondary | ICD-10-CM | POA: Insufficient documentation

## 2020-02-25 DIAGNOSIS — Z452 Encounter for adjustment and management of vascular access device: Secondary | ICD-10-CM | POA: Insufficient documentation

## 2020-02-25 DIAGNOSIS — E7800 Pure hypercholesterolemia, unspecified: Secondary | ICD-10-CM | POA: Insufficient documentation

## 2020-02-25 DIAGNOSIS — E119 Type 2 diabetes mellitus without complications: Secondary | ICD-10-CM | POA: Insufficient documentation

## 2020-02-25 DIAGNOSIS — Z87891 Personal history of nicotine dependence: Secondary | ICD-10-CM | POA: Insufficient documentation

## 2020-02-25 HISTORY — PX: PORT-A-CATH REMOVAL: SHX5289

## 2020-02-25 LAB — GLUCOSE, CAPILLARY
Glucose-Capillary: 100 mg/dL — ABNORMAL HIGH (ref 70–99)
Glucose-Capillary: 99 mg/dL (ref 70–99)

## 2020-02-25 SURGERY — REMOVAL PORT-A-CATH
Anesthesia: Monitor Anesthesia Care | Site: Chest | Laterality: Left

## 2020-02-25 MED ORDER — FENTANYL CITRATE (PF) 100 MCG/2ML IJ SOLN: 25.0000 ug | INTRAMUSCULAR | Status: DC | PRN

## 2020-02-25 MED ORDER — FENTANYL CITRATE (PF) 100 MCG/2ML IJ SOLN
50.0000 ug | INTRAMUSCULAR | Status: DC | PRN
  Administered 2020-02-25: 50 ug via INTRAVENOUS

## 2020-02-25 MED ORDER — ACETAMINOPHEN 500 MG PO TABS
ORAL_TABLET | ORAL | Status: AC
  Filled 2020-02-25: qty 2

## 2020-02-25 MED ORDER — ONDANSETRON HCL 4 MG/2ML IJ SOLN
INTRAMUSCULAR | Status: AC
  Filled 2020-02-25: qty 2

## 2020-02-25 MED ORDER — ONDANSETRON HCL 4 MG/2ML IJ SOLN: 4.0000 mg | Freq: Once | INTRAMUSCULAR | Status: DC | PRN

## 2020-02-25 MED ORDER — ONDANSETRON HCL 4 MG/2ML IJ SOLN
INTRAMUSCULAR | Status: DC | PRN
  Administered 2020-02-25: 4 mg via INTRAVENOUS

## 2020-02-25 MED ORDER — PROPOFOL 10 MG/ML IV BOLUS
INTRAVENOUS | Status: AC
  Filled 2020-02-25: qty 20

## 2020-02-25 MED ORDER — DEXAMETHASONE SODIUM PHOSPHATE 10 MG/ML IJ SOLN
INTRAMUSCULAR | Status: AC
  Filled 2020-02-25: qty 1

## 2020-02-25 MED ORDER — ACETAMINOPHEN 500 MG PO TABS
1000.0000 mg | ORAL_TABLET | Freq: Once | ORAL | Status: AC
  Administered 2020-02-25: 1000 mg via ORAL

## 2020-02-25 MED ORDER — CHLORHEXIDINE GLUCONATE CLOTH 2 % EX PADS: 6.0000 | MEDICATED_PAD | Freq: Once | CUTANEOUS | Status: DC

## 2020-02-25 MED ORDER — BUPIVACAINE HCL (PF) 0.25 % IJ SOLN
INTRAMUSCULAR | Status: DC | PRN
  Administered 2020-02-25: 10 mL

## 2020-02-25 MED ORDER — FENTANYL CITRATE (PF) 100 MCG/2ML IJ SOLN
INTRAMUSCULAR | Status: AC
  Filled 2020-02-25: qty 2

## 2020-02-25 MED ORDER — LIDOCAINE-EPINEPHRINE 2 %-1:100000 IJ SOLN
INTRAMUSCULAR | Status: AC
  Filled 2020-02-25: qty 1.7

## 2020-02-25 MED ORDER — CEFAZOLIN SODIUM-DEXTROSE 2-4 GM/100ML-% IV SOLN
INTRAVENOUS | Status: AC
  Filled 2020-02-25: qty 100

## 2020-02-25 MED ORDER — PROPOFOL 10 MG/ML IV BOLUS
INTRAVENOUS | Status: DC | PRN
  Administered 2020-02-25 (×2): 10 mg via INTRAVENOUS

## 2020-02-25 MED ORDER — LIDOCAINE 2% (20 MG/ML) 5 ML SYRINGE
INTRAMUSCULAR | Status: AC
  Filled 2020-02-25: qty 5

## 2020-02-25 MED ORDER — MIDAZOLAM HCL 2 MG/2ML IJ SOLN
1.0000 mg | INTRAMUSCULAR | Status: DC | PRN
  Administered 2020-02-25: 2 mg via INTRAVENOUS

## 2020-02-25 MED ORDER — DEXAMETHASONE SODIUM PHOSPHATE 4 MG/ML IJ SOLN
INTRAMUSCULAR | Status: DC | PRN
  Administered 2020-02-25: 5 mg via INTRAVENOUS

## 2020-02-25 MED ORDER — KETOROLAC TROMETHAMINE 15 MG/ML IJ SOLN: 15.0000 mg | Freq: Once | INTRAMUSCULAR | Status: DC | PRN

## 2020-02-25 MED ORDER — LACTATED RINGERS IV SOLN: INTRAVENOUS | Status: DC

## 2020-02-25 MED ORDER — MIDAZOLAM HCL 2 MG/2ML IJ SOLN
INTRAMUSCULAR | Status: AC
  Filled 2020-02-25: qty 2

## 2020-02-25 SURGICAL SUPPLY — 33 items
BLADE SURG 15 STRL LF DISP TIS (BLADE) ×1 IMPLANT
BLADE SURG 15 STRL SS (BLADE) ×2
CHLORAPREP W/TINT 26 (MISCELLANEOUS) ×3 IMPLANT
COVER BACK TABLE 60X90IN (DRAPES) ×3 IMPLANT
COVER MAYO STAND STRL (DRAPES) ×3 IMPLANT
COVER WAND RF STERILE (DRAPES) IMPLANT
DECANTER SPIKE VIAL GLASS SM (MISCELLANEOUS) ×3 IMPLANT
DERMABOND ADVANCED (GAUZE/BANDAGES/DRESSINGS) ×2
DERMABOND ADVANCED .7 DNX12 (GAUZE/BANDAGES/DRESSINGS) ×1 IMPLANT
DRAPE LAPAROTOMY 100X72 PEDS (DRAPES) ×3 IMPLANT
DRAPE UTILITY XL STRL (DRAPES) ×3 IMPLANT
ELECT COATED BLADE 2.86 ST (ELECTRODE) IMPLANT
ELECT REM PT RETURN 9FT ADLT (ELECTROSURGICAL) ×3
ELECTRODE REM PT RTRN 9FT ADLT (ELECTROSURGICAL) ×1 IMPLANT
GLOVE BIO SURGEON STRL SZ 6.5 (GLOVE) ×2 IMPLANT
GLOVE BIO SURGEON STRL SZ7.5 (GLOVE) ×3 IMPLANT
GLOVE BIO SURGEONS STRL SZ 6.5 (GLOVE) ×1
GLOVE BIOGEL PI IND STRL 6.5 (GLOVE) ×1 IMPLANT
GLOVE BIOGEL PI IND STRL 7.0 (GLOVE) ×1 IMPLANT
GLOVE BIOGEL PI INDICATOR 6.5 (GLOVE) ×2
GLOVE BIOGEL PI INDICATOR 7.0 (GLOVE) ×2
GLOVE SURG SS PI 7.0 STRL IVOR (GLOVE) ×3 IMPLANT
GOWN STRL REUS W/ TWL LRG LVL3 (GOWN DISPOSABLE) ×3 IMPLANT
GOWN STRL REUS W/TWL LRG LVL3 (GOWN DISPOSABLE) ×6
NEEDLE HYPO 25X1 1.5 SAFETY (NEEDLE) ×3 IMPLANT
PENCIL SMOKE EVACUATOR (MISCELLANEOUS) ×3 IMPLANT
SET BASIN DAY SURGERY F.S. (CUSTOM PROCEDURE TRAY) ×3 IMPLANT
SLEEVE SCD COMPRESS KNEE MED (MISCELLANEOUS) IMPLANT
SUT MON AB 4-0 PC3 18 (SUTURE) ×3 IMPLANT
SUT VIC AB 3-0 SH 27 (SUTURE) ×2
SUT VIC AB 3-0 SH 27X BRD (SUTURE) ×1 IMPLANT
SYR CONTROL 10ML LL (SYRINGE) ×3 IMPLANT
TOWEL GREEN STERILE FF (TOWEL DISPOSABLE) ×3 IMPLANT

## 2020-02-25 NOTE — Op Note (Signed)
02/25/2020  9:03 AM  PATIENT:  Nichole Martinez  69 y.o. female  PRE-OPERATIVE DIAGNOSIS:  history of breast cancer  POST-OPERATIVE DIAGNOSIS:  history of breast cancer  PROCEDURE:  Procedure(s): REMOVAL PORT-A-CATH (Left)  SURGEON:  Surgeon(s) and Role:    * Jovita Kussmaul, MD - Primary  PHYSICIAN ASSISTANT:   ASSISTANTS: none   ANESTHESIA:   local and IV sedation  EBL:  minimal   BLOOD ADMINISTERED:none  DRAINS: none   LOCAL MEDICATIONS USED:  MARCAINE     SPECIMEN:  No Specimen  DISPOSITION OF SPECIMEN:  N/A  COUNTS:  YES  TOURNIQUET:  * No tourniquets in log *  DICTATION: .Dragon Dictation   After informed consent was obtained the patient was brought to the operating room and placed in the supine position on the operating table.  After adequate IV sedation had been given the patient's left chest was prepped with ChloraPrep, allowed to dry, and draped in usual sterile manner.  An appropriate timeout was performed.  The area around the port was infiltrated with quarter percent Marcaine.  A small incision was then made through her previous incision with a 15 blade knife.  The incision was carried through the subcutaneous tissue sharply with a 15 blade knife until the capsule surrounding the port was opened.  The 2 anchoring stitches were divided and removed.  The port was then gently pushed out of its pocket and with gentle traction was removed from the patient without difficulty.  Pressure was held for several minutes until the area was completely hemostatic.  The deep layer of the wound was then closed with interrupted 3-0 Vicryl stitches.  The skin was closed with a running 4-0 Monocryl subcuticular stitch.  Dermabond dressings were applied.  The patient tolerated the procedure well.  At the end of the case all needle sponge and instrument counts were correct.  The patient was then awakened and taken to recovery in stable condition.  PLAN OF CARE: Discharge to home after  PACU  PATIENT DISPOSITION:  PACU - hemodynamically stable.   Delay start of Pharmacological VTE agent (>24hrs) due to surgical blood loss or risk of bleeding: not applicable

## 2020-02-25 NOTE — H&P (Signed)
Nichole Martinez  Location: [facility] Surgery Patient #: [id number] DOB: 1951-01-29 Divorced / Language: Undefined / Race: White Female   History of Present Illness  The patient is a 68 year old female who presents for a follow-up for Breast cancer. The patient is a 69 year old white female who is about 3 weeks status post right mastectomy and sentinel node biopsy with targeted node dissection for a T2 N1 A right breast cancer that was ER and PR positive and HER-2 negative. She also had a left prophylactic mastectomy. She tolerated the surgery well. His any significant chest wall pain.   Problem List/Past Medical  LEFT BREAST MASS (N63.20)  MALIGNANT NEOPLASM OF UPPER-OUTER QUADRANT OF RIGHT BREAST IN FEMALE, ESTROGEN RECEPTOR POSITIVE (C50.411)   Past Surgical History Breast Biopsy  Right. Breast Mass; Local Excision  Right. Hysterectomy (not due to cancer) - Complete  Sentinel Lymph Node Biopsy  Spinal Surgery - Lower Back   Diagnostic Studies History  Colonoscopy  never Mammogram  within last year Pap Smear  1-5 years ago  Allergies  Morphine Sulfate *ANALGESICS - OPIOID*  Anaphylaxis.  Medication History  Lidocaine-Prilocaine (2.5-2.5% Cream, External) Active. ALPRAZolam (1MG Tablet, Oral) Active. amLODIPine Besylate (5MG Tablet, Oral) Active. Baclofen (10MG Tablet, Oral) Active. Bisoprolol-hydroCHLOROthiazide (10-6.25MG Tablet, Oral) Active. buPROPion HCl ER (XL) (150MG Tablet ER 24HR, Oral) Active. Letrozole (2.5MG Tablet, Oral) Active. Lisinopril-hydroCHLOROthiazide (10-12.5MG Tablet, Oral) Active. Loratadine (10MG Tablet, Oral) Active. Meloxicam (15MG Tablet, Oral) Active. metFORMIN HCl (1000MG Tablet, Oral) Active. Ondansetron HCl (8MG Tablet, Oral) Active. Prochlorperazine Maleate (10MG Tablet, Oral) Active. Rizatriptan Benzoate (5MG Tablet, Oral) Active. Simvastatin (20MG Tablet, Oral) Active. Vitamin D (Ergocalciferol) (1.25  MG(50000 UT) Capsule, Oral) Active. Vitamin E (1000UNIT Capsule, Oral) Active. Vitamin D3 (125 MCG(5000 UT) Tablet Chewable, Oral) Active. Multi-Vitamin (Oral) Active. Omega 3-6-9 (Oral) Active. Medications Reconciled  Social History Caffeine use  Coffee, Tea. No alcohol use  No drug use  Tobacco use  Former smoker.  Family History  Family history unknown  First Degree Relatives   Pregnancy / Birth History  Age at menarche  82 years. Age of menopause  54-50 Gravida  1 Maternal age  24-25 Para  19  Other Problems  Anxiety Disorder  Arthritis  Breast Cancer  Diabetes Mellitus  High blood pressure  Hypercholesterolemia  Kidney Stone   Vitals 12/07/2019 11:45 AM Weight: 188.25 lb Height: 65in Body Surface Area: 1.93 m Body Mass Index: 31.33 kg/m  Pulse: 96 (Regular)  BP: 140/80(Sitting, Left Arm, Standard)       Physical Exam Eddie Dibbles S. Marlou Starks MD; 12/08/2019 1:04 PM) Breast Note: Both mastectomy incisions are healing nicely with no sign of infection. There is a minimal amount of seroma fluid beneath each skin flap but likely not large enough to hit reliably with a needle     Assessment & Plan  MALIGNANT NEOPLASM OF UPPER-OUTER QUADRANT OF RIGHT BREAST IN FEMALE, ESTROGEN RECEPTOR POSITIVE (C50.411) Impression: The patient is about 3 weeks status post right mastectomy for breast cancer with left prophylactic mastectomy. Since her drains were removed she has developed a very small amount of seroma fluid. At this point I would recommend continuing to watch this. If it gets smaller than it may not need aspiration and if it gets larger it'll be easier to hit. I will plan to see her back in about 2 weeks to check her progress. She will continue to wear a compressive garment. Current Plans Instructed to keep follow-up appointment as scheduled  Now for port removal.

## 2020-02-25 NOTE — Discharge Instructions (Signed)
  Post Anesthesia Home Care Instructions  Activity: Get plenty of rest for the remainder of the day. A responsible individual must stay with you for 24 hours following the procedure.  For the next 24 hours, DO NOT: -Drive a car -Paediatric nurse -Drink alcoholic beverages -Take any medication unless instructed by your physician -Make any legal decisions or sign important papers.  Meals: Start with liquid foods such as gelatin or soup. Progress to regular foods as tolerated. Avoid greasy, spicy, heavy foods. If nausea and/or vomiting occur, drink only clear liquids until the nausea and/or vomiting subsides. Call your physician if vomiting continues.  Special Instructions/Symptoms: Your throat may feel dry or sore from the anesthesia or the breathing tube placed in your throat during surgery. If this causes discomfort, gargle with warm salt water. The discomfort should disappear within 24 hours.  If you had a scopolamine patch placed behind your ear for the management of post- operative nausea and/or vomiting:  1. The medication in the patch is effective for 72 hours, after which it should be removed.  Wrap patch in a tissue and discard in the trash. Wash hands thoroughly with soap and water. 2. You may remove the patch earlier than 72 hours if you experience unpleasant side effects which may include dry mouth, dizziness or visual disturbances. 3. Avoid touching the patch. Wash your hands with soap and water after contact with the patch.  No tylenol today until after 2pm if needed.

## 2020-02-25 NOTE — Anesthesia Postprocedure Evaluation (Signed)
Anesthesia Post Note  Patient: Nichole Martinez  Procedure(s) Performed: REMOVAL PORT-A-CATH (Left Chest)     Patient location during evaluation: PACU Anesthesia Type: MAC Level of consciousness: awake and alert and oriented Pain management: pain level controlled Vital Signs Assessment: post-procedure vital signs reviewed and stable Respiratory status: spontaneous breathing, nonlabored ventilation and respiratory function stable Cardiovascular status: blood pressure returned to baseline Postop Assessment: no apparent nausea or vomiting Anesthetic complications: no   No complications documented.  Last Vitals:  Vitals:   02/25/20 0914 02/25/20 0941  BP: (!) 135/94 (!) 147/83  Pulse: (!) 48 (!) 48  Resp: 13 16  Temp: 36.7 C 36.7 C  SpO2: 98% 96%    Last Pain:  Vitals:   02/25/20 0941  TempSrc: Oral  PainSc: 0-No pain                 Brennan Bailey

## 2020-02-25 NOTE — Transfer of Care (Signed)
Immediate Anesthesia Transfer of Care Note  Patient: Nichole Martinez  Procedure(s) Performed: REMOVAL PORT-A-CATH (Left Chest)  Patient Location: PACU  Anesthesia Type:MAC  Level of Consciousness: awake and alert   Airway & Oxygen Therapy: Patient Spontanous Breathing and Patient connected to face mask oxygen  Post-op Assessment: Report given to RN and Post -op Vital signs reviewed and stable  Post vital signs: Reviewed and stable  Last Vitals:  Vitals Value Taken Time  BP 135/94 02/25/20 0906  Temp    Pulse 49 02/25/20 0908  Resp 10 02/25/20 0908  SpO2 100 % 02/25/20 0908  Vitals shown include unvalidated device data.  Last Pain:  Vitals:   02/25/20 0745  TempSrc: Oral         Complications: No complications documented.

## 2020-02-25 NOTE — Interval H&P Note (Signed)
History and Physical Interval Note:  02/25/2020 8:05 AM  Nichole Martinez  has presented today for surgery, with the diagnosis of history of breast cancer.  The various methods of treatment have been discussed with the patient and family. After consideration of risks, benefits and other options for treatment, the patient has consented to  Procedure(s): REMOVAL PORT-A-CATH (N/A) as a surgical intervention.  The patient's history has been reviewed, patient examined, no change in status, stable for surgery.  I have reviewed the patient's chart and labs.  Questions were answered to the patient's satisfaction.     Autumn Messing III

## 2020-02-28 ENCOUNTER — Encounter (HOSPITAL_BASED_OUTPATIENT_CLINIC_OR_DEPARTMENT_OTHER): Payer: Self-pay | Admitting: General Surgery

## 2020-02-29 NOTE — Progress Notes (Signed)
[place]  [phone] [phone]  ID: Nichole Martinez OB: 03-25-1951  MR#: [id number]  ERD#:[id number]  Patient Care Team: [name] as PCP - General (Family Medicine) Rico Junker, RN as Registered Nurse Marlyn Corporal, Clearnce Sorrel, PA-C as Physician Assistant (Family Medicine) Lloyd Huger, MD as Consulting Physician (Oncology) Jovita Kussmaul, MD as Consulting Physician (General Surgery)  I connected with Nichole Martinez on 03/04/20 at 10:15 AM EDT by video enabled telemedicine visit and verified that I am speaking with the correct person using two identifiers.   I discussed the limitations, risks, security and privacy concerns of performing an evaluation and management service by telemedicine and the availability of in-person appointments. I also discussed with the patient that there may be a patient responsible charge related to this service. The patient expressed understanding and agreed to proceed.   Other persons participating in the visit and their role in the encounter: Patient, MD.  Patient's location: Home. Provider's location: Clinic.  CHIEF COMPLAINT: Clinical stage IIa ER/PR positive, HER-2 negative invasive carcinoma of the central portion of the right breast.  INTERVAL HISTORY: Patient agreed to video assisted telemedicine visit routine 40-monthevaluation.  She continues to feel well and remains asymptomatic.  She is tolerating letrozole without significant side effects. She has no neurologic complaints. She denies any recent fevers or illnesses. She has a good appetite and denies weight loss.  She has no chest pain, shortness of breath, cough, or hemoptysis. She denies any nausea, vomiting, constipation, or diarrhea.  She has no urinary complaints.  Patient offers no specific complaints today.  REVIEW OF SYSTEMS:   Review of Systems  Constitutional: Negative.  Negative for fever, malaise/fatigue and weight  loss.  Respiratory: Negative.  Negative for cough, hemoptysis and shortness of breath.   Cardiovascular: Negative.  Negative for chest pain and leg swelling.  Gastrointestinal: Negative.  Negative for abdominal pain, diarrhea and nausea.  Genitourinary: Negative.  Negative for dysuria.  Musculoskeletal: Negative.  Negative for back pain and myalgias.  Skin: Negative.  Negative for rash.  Neurological: Negative.  Negative for focal weakness, weakness and headaches.  Psychiatric/Behavioral: Negative.  The patient is not nervous/anxious.     As per HPI. Otherwise, a complete review of systems is negative.  PAST MEDICAL HISTORY: Past Medical History:  Diagnosis Date  . Allergy   . Breast cancer, right ([place] 2020   Currently taking hormonal chemo tx's.   . Depression   . Diabetes mellitus without complication ([place] [address] . Dyspnea    due to weight  . Hyperlipidemia   . Hypertension   . Osteoporosis   . Trigeminal neuralgia     PAST SURGICAL HISTORY: Past Surgical History:  Procedure Laterality Date  . ABDOMINAL HYSTERECTOMY  1978  . BREAST BIOPSY Right 01/06/2019   uKoreabx x 2.  Ribbon marker for mass and hydro for Lymph node. Path pending  . BREAST CYST EXCISION Right 1986  . BREAST EXCISIONAL BIOPSY    . IR IMAGING GUIDED PORT INSERTION  05/12/2019  . LAMINECTOMY  1995   L1, L2, L3  . MASTECTOMY WITH AXILLARY LYMPH NODE DISSECTION Bilateral 11/17/2019   Procedure: BILATERAL MASTECTOMIES WITH RIGHT TARGETED SEED NODE DISSECTION;  Surgeon: TJovita Kussmaul MD;  [place]  Service: General;  Laterality: Bilateral;  . PORT-A-CATH REMOVAL Left 02/25/2020   Procedure: REMOVAL PORT-A-CATH;  Surgeon: TJovita Kussmaul MD;  [place]  Service: General;  Laterality: Left;  . SENTINEL NODE BIOPSY Right 11/17/2019   Procedure: RIGHT SENTINEL NODE BIOPSY;  Surgeon: Jovita Kussmaul, MD;  Location[place];  Service: General;   Laterality: Right;  . SPINE SURGERY      FAMILY HISTORY: Family History  Adopted: Yes  Family history unknown: Yes    ADVANCED DIRECTIVES (Y/N):  N  HEALTH MAINTENANCE: Social History   Tobacco Use  . Smoking status: Former Smoker    Quit date: 03/16/2000    Years since quitting: 19.9  . Smokeless tobacco: Never Used  Vaping Use  . Vaping Use: Never used  Substance Use Topics  . Alcohol use: No  . Drug use: No     Colonoscopy:  PAP:  Bone density:  Lipid panel:  Allergies  Allergen Reactions  . Morphine Anaphylaxis    Anaphallaxis.    Current Outpatient Medications  Medication Sig Dispense Refill  . alendronate (FOSAMAX) 70 MG tablet Take 1 tablet (70 mg total) by mouth once a week. Take with a full glass of water on an empty stomach. 4 tablet 0  . ALPRAZolam (XANAX) 1 MG tablet TAKE 1 TABLET BY MOUTH AT BEDTIME AS NEEDED FOR ANXIETY 90 tablet 1  . amLODipine (NORVASC) 5 MG tablet Take 1 tablet (5 mg total) by mouth daily. 90 tablet 3  . baclofen (LIORESAL) 10 MG tablet Take 1 tablet (10 mg total) by mouth daily. 90 tablet 3  . bisoprolol-hydrochlorothiazide (ZIAC) 10-6.25 MG tablet Take 1 tablet by mouth 2 (two) times daily. 180 tablet 3  . buPROPion (WELLBUTRIN XL) 150 MG 24 hr tablet Take 1 tablet (150 mg total) by mouth 2 (two) times a day. 180 tablet 1  . Cholecalciferol (VITAMIN D3) 5000 units CAPS Take by mouth.    . letrozole (FEMARA) 2.5 MG tablet Take 1 tablet (2.5 mg total) by mouth daily. 90 tablet 3  . lisinopril-hydrochlorothiazide (ZESTORETIC) 10-12.5 MG tablet Take 1 tablet by mouth daily. 90 tablet 3  . loratadine (CLARITIN) 10 MG tablet Take 1 tablet (10 mg total) by mouth daily as needed. 90 tablet 3  . meloxicam (MOBIC) 15 MG tablet Take 1 tablet (15 mg total) by mouth daily. 90 tablet 3  . metFORMIN (GLUCOPHAGE) 1000 MG tablet Take 1 tablet (1,000 mg total) by mouth 2 (two) times daily with a meal. 180 tablet 3  . MULTIPLE VITAMINS PO  MULTIVITAMINS (Oral Tablet)  1 Every Day for 0 days  Quantity: 0.00;  Refills: 0   Ordered :17-Apr-2011  [name] ;  Started 24-February-2009 Active    . OMEGA 3-6-9 FATTY ACIDS PO Reported on 10/04/2015    . ondansetron (ZOFRAN) 8 MG tablet Take 8 mg by mouth 2 (two) times daily as needed.    . rizatriptan (MAXALT) 5 MG tablet TAKE ONE TABLET BY MOUTH AS NEEDED FOR MIGRAINE. MAY REPEAT IN 2 HOURS IF NEEDED. 10 tablet 11  . simvastatin (ZOCOR) 20 MG tablet Take 1 tablet (20 mg total) by mouth at bedtime. 90 tablet 3  . Vitamin D, Ergocalciferol, (DRISDOL) 1.25 MG (50000 UT) CAPS capsule Take 1 capsule (50,000 Units total) by mouth every 7 (seven) days. 12 capsule 4  . vitamin E 1000 UNIT capsule Take 1,000 Units by mouth daily.     No current facility-administered medications for this visit.   Facility-Administered Medications Ordered in Other Visits  Medication Dose Route Frequency Provider Last Rate Last Admin  . heparin lock flush 100 unit/mL  500 Units Intravenous Once Lloyd Huger, MD      . sodium chloride flush (NS) 0.9 % injection 10 mL  10 mL Intravenous Once Grayland Ormond Kathlene November, MD      Graduate for companion yesterday dog  OBJECTIVE: Vitals:     There is no height or weight on file to calculate BMI.    ECOG FS:0 - Asymptomatic  General: Well-developed, well-nourished, no acute distress. HEENT: Normocephalic. Neuro: Alert, answering all questions appropriately. Cranial nerves grossly intact. Psych: Normal affect.  LAB RESULTS:  Lab Results  Component Value Date   NA 139 02/22/2020   K 4.8 02/22/2020   CL 104 02/22/2020   CO2 27 02/22/2020   GLUCOSE 120 (H) 02/22/2020   BUN 19 02/22/2020   CREATININE 0.79 02/22/2020   CALCIUM 9.6 02/22/2020   PROT 6.2 (L) 09/30/2019   ALBUMIN 3.8 09/30/2019   AST 15 09/30/2019   ALT 13 09/30/2019   ALKPHOS 52 09/30/2019   BILITOT 0.4 09/30/2019   GFRNONAA >60 02/22/2020   GFRAA >60 02/22/2020    Lab Results  Component  Value Date   WBC 4.3 09/30/2019   NEUTROABS 2.4 09/30/2019   HGB 10.3 (L) 09/30/2019   HCT 32.9 (L) 09/30/2019   MCV 89.4 09/30/2019   PLT 247 09/30/2019     STUDIES: No results found.  ASSESSMENT: Clinical stage IIa ER/PR positive, HER-2 negative invasive carcinoma of the central portion of the right breast.  Oncotype DX score 7.  PLAN:   1. Clinical stage IIa ER/PR positive, HER-2 negative invasive carcinoma of the central portion of the right breast: Although patient is clinical stage IIa and typically neoadjuvant chemotherapy is the recommendation, patient initially did not wish to pursue aggressive immunosuppressive treatment during the COVID-19 pandemic.  Instead, patient received neoadjuvant letrozole from April 2020 through August 2020.  She then proceeded with neoadjuvant chemotherapy using Adriamycin, Cytoxan with Udenyca support followed by weekly Taxol x12. Patient completed her chemotherapy on September 30, 2019. She subsequently underwent bilateral mastectomy on November 17, 2019. Residual disease was noted in the breast and patient had evidence of micrometastasis in one lymph node. Because of her bilateral mastectomy, she did not require adjuvant XRT. Continue letrozole for a minimum of 5 years through April 2025. Given her high risk disease, will likely extend treatment 7 to 10 years.  No intervention is needed at this time.  Return to clinic in 6 months with video assisted telemedicine visit. 2.  Osteopenia: Bone mineral density on December 14, 2019 reported T score of -1.9.  Continue calcium and vitamin D supplementation.  Repeat in March 2022. 3.  COVID positivity: Resolved.  Patient reports repeat testing is negative. It is likely that the groundglass densities seen on her CT scan from March 17, 2019 are residual from her infection.  No intervention is needed at this time. 3.  Thyroid nodule: Biopsy reported as benign.  Likely multinodular goiter. 4.  Left breast nodule: Bilateral  mastectomy as above. No evidence of malignancy noted. 5.  Anemia: Patient's last hemoglobin on September 30, 2019 was reported at 10.3.   I provided 20 minutes of face-to-face video visit time during this encounter which included chart review, counseling, and coordination of care as documented above.   Patient expressed understanding and was in agreement with this plan. She also understands that She can call clinic at any time with any questions, concerns, or complaints.   Cancer Staging Cancer of central portion of right female breast ([place])  Staging form: Breast, AJCC 8th Edition - Clinical stage from 01/13/2019: Stage IIA (cT2, cN1, cM0, G1, ER+, PR+, HER2-) - Signed by Lloyd Huger, MD on 01/13/2019   Lloyd Huger, MD   03/04/2020 10:39 AM

## 2020-03-03 ENCOUNTER — Encounter: Payer: Self-pay | Admitting: Oncology

## 2020-03-03 ENCOUNTER — Inpatient Hospital Stay: Payer: Medicare PPO | Attending: Oncology | Admitting: Oncology

## 2020-03-03 DIAGNOSIS — Z170 Estrogen receptor positive status [ER+]: Secondary | ICD-10-CM

## 2020-03-03 DIAGNOSIS — C50111 Malignant neoplasm of central portion of right female breast: Secondary | ICD-10-CM

## 2020-03-13 ENCOUNTER — Ambulatory Visit: Payer: Medicare PPO

## 2020-03-23 ENCOUNTER — Other Ambulatory Visit: Payer: Self-pay | Admitting: Physician Assistant

## 2020-03-23 DIAGNOSIS — M85851 Other specified disorders of bone density and structure, right thigh: Secondary | ICD-10-CM

## 2020-03-23 DIAGNOSIS — Z853 Personal history of malignant neoplasm of breast: Secondary | ICD-10-CM

## 2020-03-27 ENCOUNTER — Encounter: Payer: Self-pay | Admitting: Physician Assistant

## 2020-04-05 DIAGNOSIS — M1712 Unilateral primary osteoarthritis, left knee: Secondary | ICD-10-CM | POA: Insufficient documentation

## 2020-04-05 DIAGNOSIS — M1711 Unilateral primary osteoarthritis, right knee: Secondary | ICD-10-CM | POA: Insufficient documentation

## 2020-04-07 ENCOUNTER — Other Ambulatory Visit: Payer: Self-pay

## 2020-04-07 ENCOUNTER — Encounter: Payer: Self-pay | Admitting: Physician Assistant

## 2020-04-07 ENCOUNTER — Ambulatory Visit: Payer: Medicare PPO | Admitting: Physician Assistant

## 2020-04-07 VITALS — BP 121/81 | HR 64 | Temp 97.7°F | Wt 187.0 lb

## 2020-04-07 DIAGNOSIS — M179 Osteoarthritis of knee, unspecified: Secondary | ICD-10-CM

## 2020-04-07 DIAGNOSIS — M1710 Unilateral primary osteoarthritis, unspecified knee: Secondary | ICD-10-CM

## 2020-04-07 MED ORDER — GABAPENTIN 100 MG PO CAPS: 100.0000 mg | ORAL_CAPSULE | Freq: Three times a day (TID) | ORAL | 3 refills | Status: DC

## 2020-04-07 NOTE — Assessment & Plan Note (Addendum)
Discussed options for pain control before her upcoming knee replacement surgeries. Pt would like to try gabapentin 100mg  up to three times a day as needed.  Advised pt to call if the medicine needs to be adjusted.

## 2020-04-07 NOTE — Progress Notes (Signed)
I,Laura E Walsh,acting as a Education administrator for [employer], PA-C.,have documented all relevant documentation on the behalf of [name], PA-C,as directed by  [name], PA-C while in the presence of [name], [place].  Established patient visit   Patient: Nichole Martinez   DOB: Nov 06, 1950   69 y.o. Female  MRN: [id number] Visit Date: 04/07/2020  Today's healthcare provider: [name], PA-C   Chief Complaint  Patient presents with  . Knee Pain   Subjective    Knee Pain  The pain is present in the right knee and left knee. The quality of the pain is described as aching. Associated symptoms include an inability to bear weight. Pertinent negatives include no loss of motion, loss of sensation, muscle weakness, numbness or tingling. She has tried acetaminophen for the symptoms. The treatment provided no relief.    Pt is scheduled to have both knee replaced: one in two months and the other shortly after.  She would like to talk about possible options for pain control now until she can make it to her surgery.  She states she has spoken is a few friends that were in the same situation and they suggested to try gabapentin.    Medications: Outpatient Medications Prior to Visit  Medication Sig  . alendronate (FOSAMAX) 70 MG tablet TAKE 1 TABLET BY MOUTH ONCE A WEEK. TAKE WITH A FULL GLASS OF WATER ON AN EMPTY STOMACH.  Marland Kitchen ALPRAZolam (XANAX) 1 MG tablet TAKE 1 TABLET BY MOUTH AT BEDTIME AS NEEDED FOR ANXIETY  . amLODipine (NORVASC) 5 MG tablet Take 1 tablet (5 mg total) by mouth daily.  . baclofen (LIORESAL) 10 MG tablet Take 1 tablet (10 mg total) by mouth daily.  . bisoprolol-hydrochlorothiazide (ZIAC) 10-6.25 MG tablet Take 1 tablet by mouth 2 (two) times daily.  Marland Kitchen buPROPion (WELLBUTRIN XL) 150 MG 24 hr tablet Take 1 tablet (150 mg total) by mouth 2 (two) times a day.  . Cholecalciferol (VITAMIN D3) 5000 units CAPS Take by mouth.  . letrozole (FEMARA)  2.5 MG tablet Take 1 tablet (2.5 mg total) by mouth daily.  Marland Kitchen lisinopril-hydrochlorothiazide (ZESTORETIC) 10-12.5 MG tablet Take 1 tablet by mouth daily.  Marland Kitchen loratadine (CLARITIN) 10 MG tablet Take 1 tablet (10 mg total) by mouth daily as needed.  . meloxicam (MOBIC) 15 MG tablet Take 1 tablet (15 mg total) by mouth daily.  . metFORMIN (GLUCOPHAGE) 1000 MG tablet Take 1 tablet (1,000 mg total) by mouth 2 (two) times daily with a meal.  . MULTIPLE VITAMINS PO MULTIVITAMINS (Oral Tablet)  1 Every Day for 0 days  Quantity: 0.00;  Refills: 0   Ordered :17-Apr-2011  [name] ;  Started 24-February-2009 Active  . ondansetron (ZOFRAN) 8 MG tablet Take 8 mg by mouth 2 (two) times daily as needed.  . rizatriptan (MAXALT) 5 MG tablet TAKE ONE TABLET BY MOUTH AS NEEDED FOR MIGRAINE. MAY REPEAT IN 2 HOURS IF NEEDED.  Marland Kitchen simvastatin (ZOCOR) 20 MG tablet Take 1 tablet (20 mg total) by mouth at bedtime.  . Vitamin D, Ergocalciferol, (DRISDOL) 1.25 MG (50000 UT) CAPS capsule Take 1 capsule (50,000 Units total) by mouth every 7 (seven) days.  . vitamin E 1000 UNIT capsule Take 1,000 Units by mouth daily.  . OMEGA 3-6-9 FATTY ACIDS PO Reported on 10/04/2015   Facility-Administered Medications Prior to Visit  Medication Dose Route Frequency Provider  . heparin lock flush 100 unit/mL  500 Units Intravenous  Once Lloyd Huger, MD  . sodium chloride flush (NS) 0.9 % injection 10 mL  10 mL Intravenous Once Lloyd Huger, MD    Review of Systems  Constitutional: Negative.   Respiratory: Negative.   Cardiovascular: Negative.   Gastrointestinal: Negative.   Musculoskeletal: Positive for arthralgias, gait problem and joint swelling.  Neurological: Negative for tingling and numbness.      Objective    BP 121/81 (BP Location: Left Arm, Patient Position: Sitting, Cuff Size: Large)   Pulse 64   Temp 97.7 F (36.5 C) (Temporal)   Wt 187 lb (84.8 kg)   BMI 32.10 kg/m    Physical Exam Vitals reviewed.    Constitutional:      Appearance: Normal appearance. She is obese.  Musculoskeletal:     Right knee: Swelling present. Decreased range of motion.     Left knee: Swelling present. Decreased range of motion.  Skin:    General: Skin is warm and dry.  Neurological:     General: No focal deficit present.     Mental Status: She is alert and oriented to person, place, and time. Mental status is at baseline.  Psychiatric:        Mood and Affect: Mood normal.        Behavior: Behavior normal.        Thought Content: Thought content normal.        Judgment: Judgment normal.     No results found for any visits on 04/07/20.  Assessment & Plan     Problem List Items Addressed This Visit      Musculoskeletal and Integument   Arthritis of knee, degenerative - Primary    Discussed options for pain control before her upcoming knee replacement surgeries. Pt would like to try gabapentin 100mg  up to three times a day as needed.  Advised pt to call if the medicine needs to be adjusted.        Relevant Medications   gabapentin (NEURONTIN) 100 MG capsule      Return if symptoms worsen or fail to improve.      Reynolds Bowl, PA-C, have reviewed all documentation for this visit. The documentation on 04/11/20 for the exam, diagnosis, procedures, and orders are all accurate and complete.   [name]  [facility] [phone] (phone) [phone] (fax)  [place]

## 2020-04-07 NOTE — Patient Instructions (Signed)

## 2020-04-10 ENCOUNTER — Ambulatory Visit: Payer: Medicare PPO

## 2020-04-11 ENCOUNTER — Encounter: Payer: Self-pay | Admitting: Physician Assistant

## 2020-04-12 ENCOUNTER — Encounter: Payer: Self-pay | Admitting: Physician Assistant

## 2020-04-16 ENCOUNTER — Other Ambulatory Visit: Payer: Self-pay | Admitting: Physician Assistant

## 2020-04-16 DIAGNOSIS — Z853 Personal history of malignant neoplasm of breast: Secondary | ICD-10-CM

## 2020-04-16 DIAGNOSIS — Z96659 Presence of unspecified artificial knee joint: Secondary | ICD-10-CM | POA: Insufficient documentation

## 2020-04-16 DIAGNOSIS — M85851 Other specified disorders of bone density and structure, right thigh: Secondary | ICD-10-CM

## 2020-04-16 NOTE — Telephone Encounter (Signed)
Requested Prescriptions  Pending Prescriptions Disp Refills   alendronate (FOSAMAX) 70 MG tablet [Pharmacy Med Name: Alendronate Sodium 70 MG Oral Tablet] 4 tablet 0    Sig: TAKE 1 TABLET BY MOUTH ONCE A WEEK .TAKE  WITH  A  FULL  A  GLASS  OF  WATER  ON  AN  EMPTY  STOMACH     Endocrinology:  Bisphosphonates Passed - 04/16/2020  2:45 PM      Passed - Ca in normal range and within 360 days    Calcium  Date Value Ref Range Status  02/22/2020 9.6 8.9 - 10.3 mg/dL Final         Passed - Vitamin D in normal range and within 360 days    Vit D, 25-Hydroxy  Date Value Ref Range Status  08/05/2019 74.36 30 - 100 ng/mL Final    Comment:    (NOTE) Vitamin D deficiency has been defined by the [place] practice guideline as a level of serum 25-OH  vitamin D less than 20 ng/mL (1,2). The Endocrine Society went on to  further define vitamin D insufficiency as a level between 21 and 29  ng/mL (2). 1. IOM (Institute of Medicine). 2010. Dietary reference intakes for  calcium and D. [place]: The [employer]. 2. Holick MF, [place], Bischoff-Ferrari HA, et al. Evaluation,  treatment, and prevention of vitamin D deficiency: an Endocrine  Society clinical practice guideline, JCEM. 2011 Jul; 96(7): 1911-30. Performed at [facility], [place] [address] [place], [place] [address]          Passed - Valid encounter within last 12 months    Recent Outpatient Visits          1 week ago Osteoarthritis of knee, unspecified laterality, unspecified osteoarthritis type   [place], [place]   2 months ago Osteopenia of neck of right femur   [place], [place], [place]   8 months ago Essential hypertension   [place], [place], [place]   1 year ago Type 2 diabetes mellitus without complication, without long-term current use of insulin [facility])   [place], Clearnce Sorrel, [place]   1 year ago Breast mass in female   [facility], Clearnce Sorrel, [place]      Future Appointments            In 4 months Grayland Ormond, Kathlene November, MD [facility]

## 2020-04-21 ENCOUNTER — Other Ambulatory Visit: Payer: Self-pay | Admitting: Physician Assistant

## 2020-04-21 DIAGNOSIS — M179 Osteoarthritis of knee, unspecified: Secondary | ICD-10-CM

## 2020-04-21 DIAGNOSIS — I10 Essential (primary) hypertension: Secondary | ICD-10-CM

## 2020-04-21 NOTE — Telephone Encounter (Signed)
Requested  medications are  due for refill today yes  Requested medications are on the active medication list yes  Last refill 4/10  Last visit Nov 2020 (that addressed BP)  Future visit scheduled no  Notes to clinic Failed protocol of visit within 6 months

## 2020-04-28 HISTORY — PX: REPLACEMENT TOTAL KNEE: SUR1224

## 2020-05-18 ENCOUNTER — Telehealth: Payer: Self-pay

## 2020-05-18 NOTE — Telephone Encounter (Signed)
Copied from [place] [id number]. Topic: General - Other >> May 18, 2020 10:41 AM Celene Kras wrote: Reason for CRM: Pt called and is requesting to set up a physical. Pt states that medicare is still allowing her one cpe, but it will not allow me to schedule anything other than an AWV for pt. Please advise.

## 2020-05-19 NOTE — Telephone Encounter (Signed)
Patient has appointment already scheduled for 11/19

## 2020-05-25 ENCOUNTER — Other Ambulatory Visit: Payer: Self-pay | Admitting: Physician Assistant

## 2020-05-25 DIAGNOSIS — M85851 Other specified disorders of bone density and structure, right thigh: Secondary | ICD-10-CM

## 2020-05-25 DIAGNOSIS — Z853 Personal history of malignant neoplasm of breast: Secondary | ICD-10-CM

## 2020-05-25 DIAGNOSIS — M179 Osteoarthritis of knee, unspecified: Secondary | ICD-10-CM

## 2020-05-30 ENCOUNTER — Ambulatory Visit: Payer: Medicare Other | Admitting: Podiatry

## 2020-06-21 HISTORY — PX: REPLACEMENT TOTAL KNEE: SUR1224

## 2020-06-23 ENCOUNTER — Other Ambulatory Visit: Payer: Self-pay | Admitting: Physician Assistant

## 2020-06-23 DIAGNOSIS — E7800 Pure hypercholesterolemia, unspecified: Secondary | ICD-10-CM

## 2020-06-23 DIAGNOSIS — I10 Essential (primary) hypertension: Secondary | ICD-10-CM

## 2020-06-23 NOTE — Telephone Encounter (Signed)
Requested medication (s) are due for refill today: yes   Requested medication (s) are on the active medication list: yes   Last refill:  03/29/2019 #90 3 refills ; zocor 20 mg and zestoretic 10-12.5 mg   Future visit scheduled: yes  Notes to clinic:   expired medications      Requested Prescriptions  Pending Prescriptions Disp Refills   simvastatin (ZOCOR) 20 MG tablet [Pharmacy Med Name: Simvastatin 20 MG Oral Tablet] 90 tablet 0    Sig: TAKE 1 TABLET BY MOUTH AT BEDTIME      Cardiovascular:  Antilipid - Statins Failed - 06/23/2020  3:43 PM      Failed - Triglycerides in normal range and within 360 days    Triglycerides  Date Value Ref Range Status  08/05/2019 153 (H) <150 mg/dL Final          Passed - Total Cholesterol in normal range and within 360 days    Cholesterol, Total  Date Value Ref Range Status  03/29/2019 160 100 - 199 mg/dL Final   Cholesterol  Date Value Ref Range Status  08/05/2019 168 0 - 200 mg/dL Final          Passed - LDL in normal range and within 360 days    LDL Calculated  Date Value Ref Range Status  03/29/2019 56 0 - 99 mg/dL Final   LDL Cholesterol  Date Value Ref Range Status  08/05/2019 94 0 - 99 mg/dL Final    Comment:           Total Cholesterol/HDL:CHD Risk Coronary Heart Disease Risk Table                     Men   Women  1/2 Average Risk   3.4   3.3  Average Risk       5.0   4.4  2 X Average Risk   9.6   7.1  3 X Average Risk  23.4   11.0        Use the calculated Patient Ratio above and the CHD Risk Table to determine the patient's CHD Risk.        ATP III CLASSIFICATION (LDL):  <100     mg/dL   Optimal  100-129  mg/dL   Near or Above                    Optimal  130-159  mg/dL   Borderline  160-189  mg/dL   High  >190     mg/dL   Very High Performed at [facility], [address] [place], [place] [address]           Passed - HDL in normal range and within 360 days    HDL  Date Value Ref Range Status   08/05/2019 43 >40 mg/dL Final  03/29/2019 47 >39 mg/dL Final          Passed - Patient is not pregnant      Passed - Valid encounter within last 12 months    Recent Outpatient Visits           2 months ago Osteoarthritis of knee, unspecified laterality, unspecified osteoarthritis type   [place], [place]   4 months ago Osteopenia of neck of right femur   [place], [place], [place]   10 months ago Essential hypertension   [place], [place], [place]   1  year ago Type 2 diabetes mellitus without complication, without long-term current use of insulin [facility])   [place], [place], [place]   1 year ago Breast mass in female   [place], [place]       Future Appointments             In 2 months Grayland Ormond, Kathlene November, MD [facility]              lisinopril-hydrochlorothiazide (ZESTORETIC) 10-12.5 MG tablet [Pharmacy Med Name: Lisinopril-hydroCHLOROthiazide 10-12.5 MG Oral Tablet] 90 tablet 0    Sig: Take 1 tablet by mouth once daily      Cardiovascular:  ACEI + Diuretic Combos Passed - 06/23/2020  3:43 PM      Passed - Na in normal range and within 180 days    Sodium  Date Value Ref Range Status  02/22/2020 139 135 - 145 mmol/L Final  03/29/2019 139 134 - 144 mmol/L Final          Passed - K in normal range and within 180 days    Potassium  Date Value Ref Range Status  02/22/2020 4.8 3.5 - 5.1 mmol/L Final          Passed - Cr in normal range and within 180 days    Creatinine, Ser  Date Value Ref Range Status  02/22/2020 0.79 0.44 - 1.00 mg/dL Final          Passed - Ca in normal range and within 180 days    Calcium  Date Value Ref Range Status  02/22/2020 9.6 8.9 - 10.3 mg/dL Final          Passed - Patient is not pregnant      Passed - Last BP in normal range    BP  Readings from Last 1 Encounters:  04/07/20 121/81          Passed - Valid encounter within last 6 months    Recent Outpatient Visits           2 months ago Osteoarthritis of knee, unspecified laterality, unspecified osteoarthritis type   [place], [place]   4 months ago Osteopenia of neck of right femur   [place], [place], [place]   10 months ago Essential hypertension   [place], [place], [place]   1 year ago Type 2 diabetes mellitus without complication, without long-term current use of insulin [facility])   [place], [place], [place]   1 year ago Breast mass in female   [facility], Clearnce Sorrel, PA-C       Future Appointments             In 2 months Grayland Ormond, Kathlene November, MD [facility]

## 2020-07-03 ENCOUNTER — Other Ambulatory Visit: Payer: Self-pay | Admitting: Physician Assistant

## 2020-07-03 DIAGNOSIS — Z853 Personal history of malignant neoplasm of breast: Secondary | ICD-10-CM

## 2020-07-03 DIAGNOSIS — M85851 Other specified disorders of bone density and structure, right thigh: Secondary | ICD-10-CM

## 2020-07-03 DIAGNOSIS — F4322 Adjustment disorder with anxiety: Secondary | ICD-10-CM

## 2020-07-03 MED ORDER — ALENDRONATE SODIUM 70 MG PO TABS: ORAL_TABLET | ORAL | 1 refills | Status: DC

## 2020-07-03 NOTE — Telephone Encounter (Signed)
[facility] faxed refill request for the following medications:   alendronate (FOSAMAX) 70 MG tablet   Please advise.  

## 2020-07-03 NOTE — Telephone Encounter (Signed)
Requested medication (s) are due for refill today:yes  Requested medication (s) are on the active medication list:yes  Last refill: 12/10/19  #90  1 refill  Future visit scheduled     yes 08/04/20  Notes to clinic: not delegated  Requested Prescriptions  Pending Prescriptions Disp Refills   ALPRAZolam (XANAX) 1 MG tablet [Pharmacy Med Name: ALPRAZolam 1 MG Oral Tablet] 30 tablet 0    Sig: TAKE 1 TABLET BY MOUTH AT BEDTIME AS NEEDED FOR ANXIETY      Not Delegated - Psychiatry:  Anxiolytics/Hypnotics Failed - 07/03/2020 12:50 PM      Failed - This refill cannot be delegated      Failed - Urine Drug Screen completed in last 360 days.      Passed - Valid encounter within last 6 months    Recent Outpatient Visits           2 months ago Osteoarthritis of knee, unspecified laterality, unspecified osteoarthritis type   [place], [place]   4 months ago Osteopenia of neck of right femur   [facility] [place] M[place] M, [place]   11 months ago Essential hypertension   [place], [place], [place]   1 year ago Type 2 diabetes mellitus without complication, without long-term current use of insulin [facility])   [place], Clearnce Sorrel, [place]   1 year ago Breast mass in female   [facility], Clearnce Sorrel, [place]       Future Appointments             In 2 months [place]land Ormond, Kathlene November, MD [facility]

## 2020-07-04 ENCOUNTER — Other Ambulatory Visit: Payer: Self-pay | Admitting: Physician Assistant

## 2020-07-04 DIAGNOSIS — G43109 Migraine with aura, not intractable, without status migrainosus: Secondary | ICD-10-CM

## 2020-07-05 ENCOUNTER — Encounter (HOSPITAL_COMMUNITY): Payer: Self-pay

## 2020-07-18 DIAGNOSIS — R269 Unspecified abnormalities of gait and mobility: Secondary | ICD-10-CM | POA: Diagnosis not present

## 2020-07-18 DIAGNOSIS — M6281 Muscle weakness (generalized): Secondary | ICD-10-CM | POA: Diagnosis not present

## 2020-07-18 DIAGNOSIS — M25661 Stiffness of right knee, not elsewhere classified: Secondary | ICD-10-CM | POA: Diagnosis not present

## 2020-07-18 DIAGNOSIS — M1711 Unilateral primary osteoarthritis, right knee: Secondary | ICD-10-CM | POA: Diagnosis not present

## 2020-07-20 DIAGNOSIS — M1711 Unilateral primary osteoarthritis, right knee: Secondary | ICD-10-CM | POA: Diagnosis not present

## 2020-07-20 DIAGNOSIS — R269 Unspecified abnormalities of gait and mobility: Secondary | ICD-10-CM | POA: Diagnosis not present

## 2020-07-20 DIAGNOSIS — M6281 Muscle weakness (generalized): Secondary | ICD-10-CM | POA: Diagnosis not present

## 2020-07-20 DIAGNOSIS — M25661 Stiffness of right knee, not elsewhere classified: Secondary | ICD-10-CM | POA: Diagnosis not present

## 2020-07-25 DIAGNOSIS — R269 Unspecified abnormalities of gait and mobility: Secondary | ICD-10-CM | POA: Diagnosis not present

## 2020-07-25 DIAGNOSIS — M1711 Unilateral primary osteoarthritis, right knee: Secondary | ICD-10-CM | POA: Diagnosis not present

## 2020-07-25 DIAGNOSIS — M6281 Muscle weakness (generalized): Secondary | ICD-10-CM | POA: Diagnosis not present

## 2020-07-25 DIAGNOSIS — M25661 Stiffness of right knee, not elsewhere classified: Secondary | ICD-10-CM | POA: Diagnosis not present

## 2020-08-04 ENCOUNTER — Other Ambulatory Visit: Payer: Self-pay

## 2020-08-04 ENCOUNTER — Ambulatory Visit (INDEPENDENT_AMBULATORY_CARE_PROVIDER_SITE_OTHER): Payer: Medicare PPO | Admitting: Physician Assistant

## 2020-08-04 ENCOUNTER — Encounter: Payer: Self-pay | Admitting: Physician Assistant

## 2020-08-04 VITALS — BP 117/77 | HR 58 | Temp 98.4°F | Ht 63.0 in | Wt 184.0 lb

## 2020-08-04 DIAGNOSIS — M85851 Other specified disorders of bone density and structure, right thigh: Secondary | ICD-10-CM | POA: Diagnosis not present

## 2020-08-04 DIAGNOSIS — G43109 Migraine with aura, not intractable, without status migrainosus: Secondary | ICD-10-CM

## 2020-08-04 DIAGNOSIS — Z853 Personal history of malignant neoplasm of breast: Secondary | ICD-10-CM

## 2020-08-04 DIAGNOSIS — E559 Vitamin D deficiency, unspecified: Secondary | ICD-10-CM | POA: Diagnosis not present

## 2020-08-04 DIAGNOSIS — E7800 Pure hypercholesterolemia, unspecified: Secondary | ICD-10-CM

## 2020-08-04 DIAGNOSIS — E119 Type 2 diabetes mellitus without complications: Secondary | ICD-10-CM

## 2020-08-04 DIAGNOSIS — M179 Osteoarthritis of knee, unspecified: Secondary | ICD-10-CM

## 2020-08-04 DIAGNOSIS — Z0000 Encounter for general adult medical examination without abnormal findings: Secondary | ICD-10-CM | POA: Diagnosis not present

## 2020-08-04 DIAGNOSIS — I10 Essential (primary) hypertension: Secondary | ICD-10-CM | POA: Diagnosis not present

## 2020-08-04 DIAGNOSIS — Z1211 Encounter for screening for malignant neoplasm of colon: Secondary | ICD-10-CM

## 2020-08-04 DIAGNOSIS — F4322 Adjustment disorder with anxiety: Secondary | ICD-10-CM

## 2020-08-04 DIAGNOSIS — M1710 Unilateral primary osteoarthritis, unspecified knee: Secondary | ICD-10-CM

## 2020-08-04 MED ORDER — AMLODIPINE BESYLATE 5 MG PO TABS: 5.0000 mg | ORAL_TABLET | Freq: Every day | ORAL | 3 refills | Status: DC

## 2020-08-04 MED ORDER — RIZATRIPTAN BENZOATE 10 MG PO TABS: 10.0000 mg | ORAL_TABLET | ORAL | 11 refills | Status: DC | PRN

## 2020-08-04 MED ORDER — SIMVASTATIN 20 MG PO TABS: 20.0000 mg | ORAL_TABLET | Freq: Every day | ORAL | 3 refills | Status: DC

## 2020-08-04 MED ORDER — ALENDRONATE SODIUM 70 MG PO TABS: ORAL_TABLET | ORAL | 3 refills | Status: DC

## 2020-08-04 MED ORDER — BACLOFEN 10 MG PO TABS: 10.0000 mg | ORAL_TABLET | Freq: Every day | ORAL | 3 refills | Status: DC

## 2020-08-04 MED ORDER — BISOPROLOL-HYDROCHLOROTHIAZIDE 10-6.25 MG PO TABS: 1.0000 | ORAL_TABLET | Freq: Two times a day (BID) | ORAL | 3 refills | Status: DC

## 2020-08-04 MED ORDER — LORATADINE 10 MG PO TABS: 10.0000 mg | ORAL_TABLET | Freq: Every day | ORAL | 3 refills | Status: DC | PRN

## 2020-08-04 MED ORDER — LISINOPRIL-HYDROCHLOROTHIAZIDE 10-12.5 MG PO TABS: 1.0000 | ORAL_TABLET | Freq: Every day | ORAL | 3 refills | Status: DC

## 2020-08-04 MED ORDER — BUPROPION HCL ER (XL) 150 MG PO TB24: 150.0000 mg | ORAL_TABLET | Freq: Two times a day (BID) | ORAL | 3 refills | Status: DC

## 2020-08-04 MED ORDER — METFORMIN HCL 1000 MG PO TABS: 1000.0000 mg | ORAL_TABLET | Freq: Two times a day (BID) | ORAL | 3 refills | Status: DC

## 2020-08-04 MED ORDER — ALPRAZOLAM 1 MG PO TABS: 1.0000 mg | ORAL_TABLET | Freq: Every evening | ORAL | 1 refills | Status: DC | PRN

## 2020-08-04 MED ORDER — VITAMIN D (ERGOCALCIFEROL) 1.25 MG (50000 UNIT) PO CAPS: 50000.0000 [IU] | ORAL_CAPSULE | ORAL | 4 refills | Status: DC

## 2020-08-04 NOTE — Progress Notes (Signed)
Annual Wellness Visit     Patient: Nichole Martinez, Female    DOB: October 21, 1950, 69 y.o.   MRN: [id number] Visit Date: 08/04/2020  Today's Provider: [name], PA-C   No chief complaint on file.  Subjective    Nichole Martinez is a 69 y.o. female who presents today for her Annual Wellness Visit. She reports consuming a general diet. Exercises regularly She generally feels well. She reports sleeping fairly well. She does not have additional problems to discuss today.   Eye Exam: [facility]  HPI   Patient Active Problem List   Diagnosis Date Noted   Cancer of right female breast ([place]) 11/17/2019   Goals of care, counseling/discussion 04/29/2019   Cancer of central portion of right female breast ([place]) 01/13/2019   Migraine 10/04/2015   Hyperlipemia 04/05/2015   Allergic rhinitis 01/31/2015   Arthritis of knee, degenerative 01/31/2015   Cannot sleep 01/31/2015   Avitaminosis D 01/31/2015   Adiposity 02/24/2009   Diabetes mellitus, type 2 ([place]) 03/07/2008   Adaptation reaction 02/19/2008   Trigeminal neuralgia 01/16/2007   Acid reflux 03/09/2003   BP (high blood pressure) 01/28/2003   Past Medical History:  Diagnosis Date   Allergy    Breast cancer, right ([place]) 2020   Currently taking hormonal chemo tx's.    Depression    Diabetes mellitus without complication ([place]) 4098   Dyspnea    due to weight   Hyperlipidemia    Hypertension    Osteoporosis    Trigeminal neuralgia    Social History   Tobacco Use   Smoking status: Former Smoker    Quit date: 03/16/2000    Years since quitting: 20.4   Smokeless tobacco: Never Used  Vaping Use   Vaping Use: Never used  Substance Use Topics   Alcohol use: No   Drug use: No   Allergies  Allergen Reactions   Morphine Anaphylaxis    Anaphallaxis.     Medications: Outpatient Medications Prior to Visit  Medication Sig   alendronate (FOSAMAX) 70 MG tablet TAKE 1 TABLET BY  MOUTH ONCE A WEEK ON AN EMPTY STOMACH WITH A FULL  GLASS  OF  WATER   ALPRAZolam (XANAX) 1 MG tablet TAKE 1 TABLET BY MOUTH AT BEDTIME AS NEEDED FOR ANXIETY   amLODipine (NORVASC) 5 MG tablet Take 1 tablet by mouth once daily   baclofen (LIORESAL) 10 MG tablet Take 1 tablet by mouth once daily   bisoprolol-hydrochlorothiazide (ZIAC) 10-6.25 MG tablet Take 1 tablet by mouth 2 (two) times daily.   buPROPion (WELLBUTRIN XL) 150 MG 24 hr tablet Take 1 tablet by mouth twice daily   EQ LORATADINE 10 MG tablet TAKE 1 TABLET BY MOUTH ONCE DAILY AS NEEDED   letrozole (FEMARA) 2.5 MG tablet Take 1 tablet (2.5 mg total) by mouth daily.   lisinopril-hydrochlorothiazide (ZESTORETIC) 10-12.5 MG tablet Take 1 tablet by mouth once daily   metFORMIN (GLUCOPHAGE) 1000 MG tablet Take 1 tablet (1,000 mg total) by mouth 2 (two) times daily with a meal.   MULTIPLE VITAMINS PO MULTIVITAMINS (Oral Tablet)  1 Every Day for 0 days  Quantity: 0.00;  Refills: 0   Ordered :17-Apr-2011  [name] ;  Started 24-February-2009 Active   OMEGA 3-6-9 FATTY ACIDS PO Reported on 10/04/2015   ondansetron (ZOFRAN) 8 MG tablet Take 8 mg by mouth 2 (two) times daily as needed.   rizatriptan (MAXALT) 5 MG tablet TAKE 1 TABLET BY MOUTH AS NEEDED  FOR  MIGRAINE.  MAY  REPEAT  IN  2  HOURS  IF  NEEDED   simvastatin (ZOCOR) 20 MG tablet TAKE 1 TABLET BY MOUTH AT BEDTIME   Vitamin D, Ergocalciferol, (DRISDOL) 1.25 MG (50000 UT) CAPS capsule Take 1 capsule (50,000 Units total) by mouth every 7 (seven) days.   vitamin E 1000 UNIT capsule Take 1,000 Units by mouth daily.   celecoxib (CELEBREX) 200 MG capsule    Cholecalciferol (VITAMIN D3) 5000 units CAPS Take by mouth.   gabapentin (NEURONTIN) 100 MG capsule Take 1 capsule (100 mg total) by mouth 3 (three) times daily.   meloxicam (MOBIC) 15 MG tablet Take 1 tablet by mouth once daily   Facility-Administered Medications Prior to Visit  Medication Dose Route Frequency Provider     heparin lock flush 100 unit/mL  500 Units Intravenous Once Lloyd Huger, MD   sodium chloride flush (NS) 0.9 % injection 10 mL  10 mL Intravenous Once Lloyd Huger, MD    Allergies  Allergen Reactions   Morphine Anaphylaxis    Anaphallaxis.    Patient Care Team: [name], PA-C as PCP - General (Family Medicine) Rico Junker, RN as Registered Nurse Marlyn Corporal, [place], PA-C as Physician Assistant (Family Medicine) Lloyd Huger, MD as Consulting Physician (Oncology) Jovita Kussmaul, MD as Consulting Physician (General Surgery)  Review of Systems  Constitutional: Negative.   HENT: Negative.   Eyes: Negative.   Respiratory: Negative.   Cardiovascular: Negative.   Gastrointestinal: Negative.   Endocrine: Negative.   Genitourinary: Negative.   Musculoskeletal: Negative.   Skin: Negative.   Allergic/Immunologic: Negative.   Neurological: Negative.   Hematological: Negative.   Psychiatric/Behavioral: Negative.       Objective    Vitals: BP 117/77 (BP Location: Left Arm, Patient Position: Sitting, Cuff Size: Large)    Pulse (!) 58    Temp 98.4 F (36.9 C) (Oral)    Ht 5\' 3"  (1.6 m)    Wt 184 lb (83.5 kg)    SpO2 100%    BMI 32.59 kg/m    Physical Exam Vitals reviewed.  Constitutional:      General: She is not in acute distress.    Appearance: Normal appearance. She is well-developed. She is obese. She is not ill-appearing or diaphoretic.  HENT:     Head: Normocephalic and atraumatic.     Right Ear: Tympanic membrane, ear canal and external ear normal.     Left Ear: Tympanic membrane, ear canal and external ear normal.  Eyes:     General: No scleral icterus.       Right eye: No discharge.        Left eye: No discharge.     Extraocular Movements: Extraocular movements intact.     Conjunctiva/sclera: Conjunctivae normal.     Pupils: Pupils are equal, round, and reactive to light.  Neck:     Thyroid: No thyromegaly.      Vascular: No carotid bruit or JVD.     Trachea: No tracheal deviation.  Cardiovascular:     Rate and Rhythm: Normal rate and regular rhythm.     Pulses: Normal pulses.     Heart sounds: Normal heart sounds. No murmur heard.  No friction rub. No gallop.   Pulmonary:     Effort: Pulmonary effort is normal. No respiratory distress.     Breath sounds: Normal breath sounds. No wheezing or rales.  Chest:     Chest wall: No  tenderness.  Abdominal:     General: Abdomen is flat. Bowel sounds are normal. There is no distension.     Palpations: Abdomen is soft. There is no mass.     Tenderness: There is no abdominal tenderness. There is no guarding or rebound.  Musculoskeletal:        General: No tenderness. Normal range of motion.     Cervical back: Normal range of motion and neck supple. No tenderness.     Right lower leg: No edema.     Left lower leg: No edema.  Lymphadenopathy:     Cervical: No cervical adenopathy.  Skin:    General: Skin is warm and dry.     Capillary Refill: Capillary refill takes less than 2 seconds.     Findings: No rash.  Neurological:     General: No focal deficit present.     Mental Status: She is alert and oriented to person, place, and time. Mental status is at baseline.  Psychiatric:        Mood and Affect: Mood normal.        Behavior: Behavior normal.        Thought Content: Thought content normal.        Judgment: Judgment normal.     Most recent functional status assessment: In your present state of health, do you have any difficulty performing the following activities: 08/04/2020  Hearing? N  Vision? N  Difficulty concentrating or making decisions? N  Walking or climbing stairs? N  Dressing or bathing? N  Doing errands, shopping? N  Some recent data might be hidden   Most recent fall risk assessment: Fall Risk  08/04/2020  Falls in the past year? 0  Number falls in past yr: 0  Injury with Fall? 0  Risk for fall due to : No Fall Risks    Follow up Falls evaluation completed    Most recent depression screenings: PHQ 2/9 Scores 08/04/2020 08/04/2019  PHQ - 2 Score 0 0  PHQ- 9 Score 2 -   Most recent cognitive screening: No flowsheet data found. Most recent Audit-C alcohol use screening Alcohol Use Disorder Test (AUDIT) 08/04/2020  1. How often do you have a drink containing alcohol? 0  2. How many drinks containing alcohol do you have on a typical day when you are drinking? 0  3. How often do you have six or more drinks on one occasion? 0  AUDIT-C Score 0  Alcohol Brief Interventions/Follow-up AUDIT Score <7 follow-up not indicated   A score of 3 or more in women, and 4 or more in men indicates increased risk for alcohol abuse, EXCEPT if all of the points are from question 1   No results found for any visits on 08/04/20.  Assessment & Plan     Annual wellness visit done today including the all of the following: Reviewed patient's Family Medical History Reviewed and updated list of patient's medical providers Assessment of cognitive impairment was done Assessed patient's functional ability Established a written schedule for health screening [place] Completed and Reviewed  Exercise Activities and Dietary recommendations Goals   None     Immunization History  Administered Date(s) Administered   Influenza,inj,Quad PF,6+ Mos 06/17/2015   Influenza-Unspecified 06/16/2020   Moderna SARS-COVID-2 Vaccination 12/15/2019, 01/12/2020    Health Maintenance  Topic Date Due   COLONOSCOPY  Never done   OPHTHALMOLOGY EXAM  03/07/2017   PNA vac Low Risk Adult (1 of 2 - PCV13) Never  done   HEMOGLOBIN A1C  09/29/2019   FOOT EXAM  03/28/2020   TETANUS/TDAP  08/04/2021 (Originally [birth year]/1972)   MAMMOGRAM  12/29/2020   INFLUENZA VACCINE  Completed   DEXA SCAN  Completed   COVID-19 Vaccine  Completed   Hepatitis C Screening  Completed     Discussed health benefits of physical  activity, and encouraged her to engage in regular exercise appropriate for her age and condition.    1. Annual physical exam Normal physical exam today. Will check labs as below and f/u pending lab results. If labs are stable and WNL she will not need to have these rechecked for one year at her next annual physical exam. She is to call the office in the meantime if she has any acute issue, questions or concerns.  2. Colon cancer screening Due for colon cancer screening. No family history. Has never had colonoscopy. Cologuard ordered as below. - Cologuard  3. Primary hypertension Stable. Continue Amlodipine 5mg , Bisoprolol-HCTZ 10-6.25mg , Lisinopril-HCTZ 10-12.5mg . Will check labs as below and f/u pending results. - CBC w/Diff/Platelet - Comprehensive Metabolic Panel (CMET) - TSH - Lipid Panel With LDL/HDL Ratio - HgB A1c  4. Type 2 diabetes mellitus without complication, without long-term current use of insulin (HCC) Stable. Diagnosis pulled for medication refill. Continue current medical treatment plan. .mbl - CBC w/Diff/Platelet - Comprehensive Metabolic Panel (CMET) - TSH - Lipid Panel With LDL/HDL Ratio - HgB A1c - metFORMIN (GLUCOPHAGE) 1000 MG tablet; Take 1 tablet (1,000 mg total) by mouth 2 (two) times daily with a meal.  Dispense: 180 tablet; Refill: 3  5. Avitaminosis D Stable. Diagnosis pulled for medication refill. Continue current medical treatment plan. Will check labs as below and f/u pending results. - CBC w/Diff/Platelet - Vitamin D (25 hydroxy) - Vitamin D, Ergocalciferol, (DRISDOL) 1.25 MG (50000 UNIT) CAPS capsule; Take 1 capsule (50,000 Units total) by mouth every 7 (seven) days.  Dispense: 12 capsule; Refill: 4  6. Pure hypercholesterolemia Stable. Diagnosis pulled for medication refill. Continue current medical treatment plan. Will check labs as below and f/u pending results. - CBC w/Diff/Platelet - Comprehensive Metabolic Panel (CMET) - TSH - Lipid Panel  With LDL/HDL Ratio - HgB A1c - simvastatin (ZOCOR) 20 MG tablet; Take 1 tablet (20 mg total) by mouth at bedtime.  Dispense: 90 tablet; Refill: 3  7. Migraine with aura and without status migrainosus, not intractable Stable. Diagnosis pulled for medication refill. Continue current medical treatment plan. - rizatriptan (MAXALT) 10 MG tablet; Take 1 tablet (10 mg total) by mouth as needed for migraine. May repeat in 2 hours if needed  Dispense: 10 tablet; Refill: 11  8. Osteopenia of neck of right femur Stable. Diagnosis pulled for medication refill. Continue current medical treatment plan. - alendronate (FOSAMAX) 70 MG tablet; TAKE 1 TABLET BY MOUTH ONCE A WEEK ON AN EMPTY STOMACH WITH A FULL  GLASS  OF  WATER  Dispense: 12 tablet; Refill: 3  9. Personal history of breast cancer Stable. Diagnosis pulled for medication refill. Continue current medical treatment plan. - alendronate (FOSAMAX) 70 MG tablet; TAKE 1 TABLET BY MOUTH ONCE A WEEK ON AN EMPTY STOMACH WITH A FULL  GLASS  OF  WATER  Dispense: 12 tablet; Refill: 3  10. Adjustment disorder with anxious mood Stable. Diagnosis pulled for medication refill. Continue current medical treatment plan. - ALPRAZolam (XANAX) 1 MG tablet; Take 1 tablet (1 mg total) by mouth at bedtime as needed for anxiety.  Dispense: 90 tablet; Refill: 1  11. Essential hypertension Stable. Diagnosis pulled for medication refill. Continue current medical treatment plan. - amLODipine (NORVASC) 5 MG tablet; Take 1 tablet (5 mg total) by mouth daily.  Dispense: 90 tablet; Refill: 3 - lisinopril-hydrochlorothiazide (ZESTORETIC) 10-12.5 MG tablet; Take 1 tablet by mouth daily.  Dispense: 90 tablet; Refill: 3 - bisoprolol-hydrochlorothiazide (ZIAC) 10-6.25 MG tablet; Take 1 tablet by mouth 2 (two) times daily.  Dispense: 180 tablet; Refill: 3  12. Osteoarthritis of knee, unspecified laterality, unspecified osteoarthritis type Stable. Diagnosis pulled for medication  refill. Continue current medical treatment plan. - baclofen (LIORESAL) 10 MG tablet; Take 1 tablet (10 mg total) by mouth daily.  Dispense: 90 tablet; Refill: 3   No follow-ups on file.     Reynolds Bowl, PA-C, have reviewed all documentation for this visit. The documentation on 08/09/20 for the exam, diagnosis, procedures, and orders are all accurate and complete.   [name]  [facility] [phone] (phone) [phone] (fax)  [place]

## 2020-08-04 NOTE — Patient Instructions (Signed)
Preventive Care 67 Years and Older, Female Preventive care refers to lifestyle choices and visits with your health care provider that can promote health and wellness. This includes:  A yearly physical exam. This is also called an annual well check.  Regular dental and eye exams.  Immunizations.  Screening for certain conditions.  Healthy lifestyle choices, such as diet and exercise. What can I expect for my preventive care visit? Physical exam Your health care provider will check:  Height and weight. These may be used to calculate body mass index (BMI), which is a measurement that tells if you are at a healthy weight.  Heart rate and blood pressure.  Your skin for abnormal spots. Counseling Your health care provider may ask you questions about:  Alcohol, tobacco, and drug use.  Emotional well-being.  Home and relationship well-being.  Sexual activity.  Eating habits.  History of falls.  Memory and ability to understand (cognition).  Work and work Statistician.  Pregnancy and menstrual history. What immunizations do I need?  Influenza (flu) vaccine  This is recommended every year. Tetanus, diphtheria, and pertussis (Tdap) vaccine  You may need a Td booster every 10 years. Varicella (chickenpox) vaccine  You may need this vaccine if you have not already been vaccinated. Zoster (shingles) vaccine  You may need this after age 67. Pneumococcal conjugate (PCV13) vaccine  One dose is recommended after age 67. Pneumococcal polysaccharide (PPSV23) vaccine  One dose is recommended after age 67. Measles, mumps, and rubella (MMR) vaccine  You may need at least one dose of MMR if you were born in 1957 or later. You may also need a second dose. Meningococcal conjugate (MenACWY) vaccine  You may need this if you have certain conditions. Hepatitis A vaccine  You may need this if you have certain conditions or if you travel or work in places where you may be exposed  to hepatitis A. Hepatitis B vaccine  You may need this if you have certain conditions or if you travel or work in places where you may be exposed to hepatitis B. Haemophilus influenzae type b (Hib) vaccine  You may need this if you have certain conditions. You may receive vaccines as individual doses or as more than one vaccine together in one shot (combination vaccines). Talk with your health care provider about the risks and benefits of combination vaccines. What tests do I need? Blood tests  Lipid and cholesterol levels. These may be checked every 5 years, or more frequently depending on your overall health.  Hepatitis C test.  Hepatitis B test. Screening  Lung cancer screening. You may have this screening every year starting at age 67 if you have a 30-pack-year history of smoking and currently smoke or have quit within the past 15 years.  Colorectal cancer screening. All adults should have this screening starting at age 67 and continuing until age 45. Your health care provider may recommend screening at age 67 if you are at increased risk. You will have tests every 1-10 years, depending on your results and the type of screening test.  Diabetes screening. This is done by checking your blood sugar (glucose) after you have not eaten for a while (fasting). You may have this done every 1-3 years.  Mammogram. This may be done every 1-2 years. Talk with your health care provider about how often you should have regular mammograms.  BRCA-related cancer screening. This may be done if you have a family history of breast, ovarian, tubal, or peritoneal cancers.  Other tests  Sexually transmitted disease (STD) testing.  Bone density scan. This is done to screen for osteoporosis. You may have this done starting at age 67. Follow these instructions at home: Eating and drinking  Eat a diet that includes fresh fruits and vegetables, whole grains, lean protein, and low-fat dairy products. Limit  your intake of foods with high amounts of sugar, saturated fats, and salt.  Take vitamin and mineral supplements as recommended by your health care provider.  Do not drink alcohol if your health care provider tells you not to drink.  If you drink alcohol: ? Limit how much you have to 0-1 drink a day. ? Be aware of how much alcohol is in your drink. In the [place] one drink equals one 12 oz bottle of beer (355 mL), one 5 oz glass of wine (148 mL), or one 1 oz glass of hard liquor (44 mL). Lifestyle  Take daily care of your teeth and gums.  Stay active. Exercise for at least 30 minutes on 5 or more days each week.  Do not use any products that contain nicotine or tobacco, such as cigarettes, e-cigarettes, and chewing tobacco. If you need help quitting, ask your health care provider.  If you are sexually active, practice safe sex. Use a condom or other form of protection in order to prevent STIs (sexually transmitted infections).  Talk with your health care provider about taking a low-dose aspirin or statin. What's next?  Go to your health care provider once a year for a well check visit.  Ask your health care provider how often you should have your eyes and teeth checked.  Stay up to date on all vaccines. This information is not intended to replace advice given to you by your health care provider. Make sure you discuss any questions you have with your health care provider. Document Revised: 08/27/2018 Document Reviewed: 08/27/2018 Elsevier Patient Education  2020 [employer].

## 2020-08-05 LAB — CBC WITH DIFFERENTIAL/PLATELET
Basophils Absolute: 0.1 10*3/uL (ref 0.0–0.2)
Basos: 1 %
EOS (ABSOLUTE): 2.2 10*3/uL — ABNORMAL HIGH (ref 0.0–0.4)
Eos: 31 %
Hematocrit: 39.5 % (ref 34.0–46.6)
Hemoglobin: 12.5 g/dL (ref 11.1–15.9)
Immature Grans (Abs): 0 10*3/uL (ref 0.0–0.1)
Immature Granulocytes: 0 %
Lymphocytes Absolute: 1.2 10*3/uL (ref 0.7–3.1)
Lymphs: 17 %
MCH: 25.1 pg — ABNORMAL LOW (ref 26.6–33.0)
MCHC: 31.6 g/dL (ref 31.5–35.7)
MCV: 79 fL (ref 79–97)
Monocytes Absolute: 0.5 10*3/uL (ref 0.1–0.9)
Monocytes: 8 %
Neutrophils Absolute: 2.9 10*3/uL (ref 1.4–7.0)
Neutrophils: 43 %
Platelets: 286 10*3/uL (ref 150–450)
RBC: 4.99 x10E6/uL (ref 3.77–5.28)
RDW: 14.5 % (ref 11.7–15.4)
WBC: 6.9 10*3/uL (ref 3.4–10.8)

## 2020-08-05 LAB — COMPREHENSIVE METABOLIC PANEL
ALT: 12 IU/L (ref 0–32)
AST: 17 IU/L (ref 0–40)
Albumin/Globulin Ratio: 2 (ref 1.2–2.2)
Albumin: 4.7 g/dL (ref 3.8–4.8)
Alkaline Phosphatase: 90 IU/L (ref 44–121)
BUN/Creatinine Ratio: 26 (ref 12–28)
BUN: 20 mg/dL (ref 8–27)
Bilirubin Total: 0.4 mg/dL (ref 0.0–1.2)
CO2: 25 mmol/L (ref 20–29)
Calcium: 10.1 mg/dL (ref 8.7–10.3)
Chloride: 99 mmol/L (ref 96–106)
Creatinine, Ser: 0.76 mg/dL (ref 0.57–1.00)
GFR calc Af Amer: 95 mL/min/{1.73_m2} (ref >59)
GFR calc non Af Amer: 82 mL/min/{1.73_m2} (ref >59)
Globulin, Total: 2.3 g/dL (ref 1.5–4.5)
Glucose: 94 mg/dL (ref 65–99)
Potassium: 4.5 mmol/L (ref 3.5–5.2)
Sodium: 139 mmol/L (ref 134–144)
Total Protein: 7 g/dL (ref 6.0–8.5)

## 2020-08-05 LAB — VITAMIN D 25 HYDROXY (VIT D DEFICIENCY, FRACTURES): Vit D, 25-Hydroxy: 75.7 ng/mL (ref 30.0–100.0)

## 2020-08-05 LAB — LIPID PANEL WITH LDL/HDL RATIO
Cholesterol, Total: 174 mg/dL (ref 100–199)
HDL: 65 mg/dL (ref >39)
LDL Chol Calc (NIH): 87 mg/dL (ref 0–99)
LDL/HDL Ratio: 1.3 ratio (ref 0.0–3.2)
Triglycerides: 127 mg/dL (ref 0–149)
VLDL Cholesterol Cal: 22 mg/dL (ref 5–40)

## 2020-08-05 LAB — TSH: TSH: 0.235 u[IU]/mL — ABNORMAL LOW (ref 0.450–4.500)

## 2020-08-05 LAB — HEMOGLOBIN A1C
Est. average glucose Bld gHb Est-mCnc: 117 mg/dL
Hgb A1c MFr Bld: 5.7 % — ABNORMAL HIGH (ref 4.8–5.6)

## 2020-08-07 DIAGNOSIS — R269 Unspecified abnormalities of gait and mobility: Secondary | ICD-10-CM | POA: Diagnosis not present

## 2020-08-07 DIAGNOSIS — M6281 Muscle weakness (generalized): Secondary | ICD-10-CM | POA: Diagnosis not present

## 2020-08-07 DIAGNOSIS — M1711 Unilateral primary osteoarthritis, right knee: Secondary | ICD-10-CM | POA: Diagnosis not present

## 2020-08-07 DIAGNOSIS — M25661 Stiffness of right knee, not elsewhere classified: Secondary | ICD-10-CM | POA: Diagnosis not present

## 2020-08-09 ENCOUNTER — Telehealth: Payer: Self-pay

## 2020-08-09 DIAGNOSIS — R7989 Other specified abnormal findings of blood chemistry: Secondary | ICD-10-CM

## 2020-08-09 NOTE — Telephone Encounter (Signed)
-----   Message from [name], [place] sent at 08/08/2020  1:08 PM EST ----- Blood count is looking good. Hemoglobin in normal range. Kidney and liver function are normal. Sodium, potassium, and calcium are normal. Thyroid is slightly overactive. Can recheck labs in 4-6 weeks. Cholesterol is great. Much improved triglycerides. A1c is down from 6.0 to 5.7. Vit D is normal. Keep up the good work.

## 2020-08-09 NOTE — Addendum Note (Signed)
Addended by: [name] on: 08/09/2020 09:48 AM   Modules accepted: Orders

## 2020-09-10 NOTE — Progress Notes (Signed)
[place]  [phone] [phone]) [phone]  ID: Nichole Martinez OB: Jul 04, 1951  MR#: [id number]  QXI#:[id number]  Patient Care Team: [name] as PCP - General (Family Medicine) Rico Junker, RN as Registered Nurse Marlyn Corporal, Clearnce Sorrel, PA-C as Physician Assistant (Family Medicine) Lloyd Huger, MD as Consulting Physician (Oncology) Jovita Kussmaul, MD as Consulting Physician (General Surgery)  I connected with Nichole Martinez on 09/12/20 at  1:45 PM EST by video enabled telemedicine visit and verified that I am speaking with the correct person using two identifiers.   I discussed the limitations, risks, security and privacy concerns of performing an evaluation and management service by telemedicine and the availability of in-person appointments. I also discussed with the patient that there may be a patient responsible charge related to this service. The patient expressed understanding and agreed to proceed.   Other persons participating in the visit and their role in the encounter: Patient, MD.  Patient's location: Home. Provider's location: Clinic.  CHIEF COMPLAINT: Clinical stage IIa ER/PR positive, HER-2 negative invasive carcinoma of the central portion of the right breast.  INTERVAL HISTORY: Patient agreed to video assisted telemedicine visit for routine 6 month evaluation.  She recently had bilateral knee replacement and is fully recovered.  She is tolerating letrozole without significant side effects.  She currently feels well and is asymptomatic. She has no neurologic complaints. She denies any recent fevers or illnesses. She has a good appetite and denies weight loss.  She has no chest pain, shortness of breath, cough, or hemoptysis. She denies any nausea, vomiting, constipation, or diarrhea.  She has no urinary complaints. She has no further joint pain. Patient offers no specific complaints today.  REVIEW OF SYSTEMS:    Review of Systems  Constitutional: Negative.  Negative for fever, malaise/fatigue and weight loss.  Respiratory: Negative.  Negative for cough, hemoptysis and shortness of breath.   Cardiovascular: Negative.  Negative for chest pain and leg swelling.  Gastrointestinal: Negative.  Negative for abdominal pain, diarrhea and nausea.  Genitourinary: Negative.  Negative for dysuria.  Musculoskeletal: Negative.  Negative for back pain and myalgias.  Skin: Negative.  Negative for rash.  Neurological: Negative.  Negative for focal weakness, weakness and headaches.  Psychiatric/Behavioral: Negative.  The patient is not nervous/anxious.     As per HPI. Otherwise, a complete review of systems is negative.  PAST MEDICAL HISTORY: Past Medical History:  Diagnosis Date  . Allergy   . Breast cancer, right ([place]) 2020   Currently taking hormonal chemo tx's.   . Depression   . Diabetes mellitus without complication ([place]) 0349  . Dyspnea    due to weight  . Hyperlipidemia   . Hypertension   . Osteoporosis   . Trigeminal neuralgia     PAST SURGICAL HISTORY: Past Surgical History:  Procedure Laterality Date  . ABDOMINAL HYSTERECTOMY  1978  . BREAST BIOPSY Right 01/06/2019   [place] bx x 2.  Ribbon marker for mass and hydro for Lymph node. Path pending  . BREAST CYST EXCISION Right 1986  . BREAST EXCISIONAL BIOPSY    . IR IMAGING GUIDED PORT INSERTION  05/12/2019  . LAMINECTOMY  1995   L1, L2, L3  . MASTECTOMY WITH AXILLARY LYMPH NODE DISSECTION Bilateral 11/17/2019   Procedure: BILATERAL MASTECTOMIES WITH RIGHT TARGETED SEED NODE DISSECTION;  Surgeon: Jovita Kussmaul, MD;  Location: [facility];  Service: General;  Laterality: Bilateral;  . PORT-A-CATH REMOVAL Left 02/25/2020  Procedure: REMOVAL PORT-A-CATH;  Surgeon: Jovita Kussmaul, MD;  Location: [facility];  Service: General;  Laterality: Left;  . REPLACEMENT TOTAL KNEE Left 04/28/2020  . REPLACEMENT TOTAL KNEE Right  06/21/2020  . SENTINEL NODE BIOPSY Right 11/17/2019   Procedure: RIGHT SENTINEL NODE BIOPSY;  Surgeon: Jovita Kussmaul, MD;  Location: [place];  Service: General;  Laterality: Right;  . SPINE SURGERY      FAMILY HISTORY: Family History  Adopted: Yes  Family history unknown: Yes    ADVANCED DIRECTIVES (Y/N):  N  HEALTH MAINTENANCE: Social History   Tobacco Use  . Smoking status: Former Smoker    Quit date: 03/16/2000    Years since quitting: 20.5  . Smokeless tobacco: Never Used  Vaping Use  . Vaping Use: Never used  Substance Use Topics  . Alcohol use: No  . Drug use: No     Colonoscopy:  PAP:  Bone density:  Lipid panel:  Allergies  Allergen Reactions  . Morphine Anaphylaxis    Anaphallaxis.    Current Outpatient Medications  Medication Sig Dispense Refill  . alendronate (FOSAMAX) 70 MG tablet TAKE 1 TABLET BY MOUTH ONCE A WEEK ON AN EMPTY STOMACH WITH A FULL  GLASS  OF  WATER 12 tablet 3  . ALPRAZolam (XANAX) 1 MG tablet Take 1 tablet (1 mg total) by mouth at bedtime as needed for anxiety. 90 tablet 1  . amLODipine (NORVASC) 5 MG tablet Take 1 tablet (5 mg total) by mouth daily. 90 tablet 3  . baclofen (LIORESAL) 10 MG tablet Take 1 tablet (10 mg total) by mouth daily. 90 tablet 3  . bisoprolol-hydrochlorothiazide (ZIAC) 10-6.25 MG tablet Take 1 tablet by mouth 2 (two) times daily. 180 tablet 3  . buPROPion (WELLBUTRIN XL) 150 MG 24 hr tablet Take 1 tablet (150 mg total) by mouth 2 (two) times daily. (Patient taking differently: Take 150 mg by mouth daily.) 180 tablet 3  . celecoxib (CELEBREX) 200 MG capsule     . Cholecalciferol (VITAMIN D3) 5000 units CAPS Take by mouth.    Marland Kitchen lisinopril-hydrochlorothiazide (ZESTORETIC) 10-12.5 MG tablet Take 1 tablet by mouth daily. 90 tablet 3  . loratadine (EQ LORATADINE) 10 MG tablet Take 1 tablet (10 mg total) by mouth daily as needed. 90 tablet 3  . metFORMIN (GLUCOPHAGE) 1000 MG tablet Take 1 tablet (1,000 mg  total) by mouth 2 (two) times daily with a meal. 180 tablet 3  . MULTIPLE VITAMINS PO MULTIVITAMINS (Oral Tablet)  1 Every Day for 0 days  Quantity: 0.00;  Refills: 0   Ordered :17-Apr-2011  [name] ;  Started 24-February-2009 Active    . OMEGA 3-6-9 FATTY ACIDS PO Reported on 10/04/2015    . rizatriptan (MAXALT) 10 MG tablet Take 1 tablet (10 mg total) by mouth as needed for migraine. May repeat in 2 hours if needed 10 tablet 11  . Vitamin D, Ergocalciferol, (DRISDOL) 1.25 MG (50000 UNIT) CAPS capsule Take 1 capsule (50,000 Units total) by mouth every 7 (seven) days. 12 capsule 4  . vitamin E 1000 UNIT capsule Take 1,000 Units by mouth daily.    Marland Kitchen letrozole (FEMARA) 2.5 MG tablet Take 1 tablet (2.5 mg total) by mouth daily. 90 tablet 3  . simvastatin (ZOCOR) 20 MG tablet Take 1 tablet (20 mg total) by mouth at bedtime. (Patient not taking: Reported on 09/12/2020) 90 tablet 3   No current facility-administered medications for this visit.   Facility-Administered Medications  Ordered in Other Visits  Medication Dose Route Frequency Provider Last Rate Last Admin  . heparin lock flush 100 unit/mL  500 Units Intravenous Once Lloyd Huger, MD      . sodium chloride flush (NS) 0.9 % injection 10 mL  10 mL Intravenous Once Grayland Ormond, Kathlene November, MD      Graduate for companion yesterday dog  OBJECTIVE: There were no vitals filed for this visit.   There is no height or weight on file to calculate BMI.    ECOG FS:0 - Asymptomatic  General: Well-developed, well-nourished, no acute distress. HEENT: Normocephalic. Neuro: Alert, answering all questions appropriately. Cranial nerves grossly intact. Psych: Normal affect.   LAB RESULTS:  Lab Results  Component Value Date   NA 139 08/04/2020   K 4.5 08/04/2020   CL 99 08/04/2020   CO2 25 08/04/2020   GLUCOSE 94 08/04/2020   BUN 20 08/04/2020   CREATININE 0.76 08/04/2020   CALCIUM 10.1 08/04/2020   PROT 7.0 08/04/2020   ALBUMIN 4.7 08/04/2020    AST 17 08/04/2020   ALT 12 08/04/2020   ALKPHOS 90 08/04/2020   BILITOT 0.4 08/04/2020   GFRNONAA 82 08/04/2020   GFRAA 95 08/04/2020    Lab Results  Component Value Date   WBC 6.9 08/04/2020   NEUTROABS 2.9 08/04/2020   HGB 12.5 08/04/2020   HCT 39.5 08/04/2020   MCV 79 08/04/2020   PLT 286 08/04/2020     STUDIES: No results found.  ASSESSMENT: Clinical stage IIa ER/PR positive, HER-2 negative invasive carcinoma of the central portion of the right breast.  Oncotype DX score 7.  PLAN:   1. Clinical stage IIa ER/PR positive, HER-2 negative invasive carcinoma of the central portion of the right breast: Although patient was a clinical stage IIa and typically neoadjuvant chemotherapy is the recommendation, she initially did not wish to pursue aggressive immunosuppressive treatment during the COVID-19 pandemic.  Instead, patient received neoadjuvant letrozole from April 2020 through August 2020.  She then proceeded with neoadjuvant chemotherapy using Adriamycin, Cytoxan with Udenyca support followed by weekly Taxol x12. Patient completed her chemotherapy on September 30, 2019. She subsequently underwent bilateral mastectomy on November 17, 2019. Residual disease was noted in the breast and patient had evidence of micrometastasis in one lymph node. Because of her bilateral mastectomy, she did not require adjuvant XRT. Continue letrozole for a minimum of 5 years through April 2025. Although given her high risk disease, will likely extend treatment 7 to 10 years.  No intervention is needed at this time.  Patient will have video assisted telemedicine visit in 6 months.  2.  Osteopenia: Bone mineral density on December 14, 2019 reported T score of -1.9.  Continue calcium and vitamin D supplementation.  It appears patient is also taking Fosamax. Repeat BMD in March 2022.  3.  COVID positivity: Resolved.  Patient reports repeat testing is negative. It is likely that the groundglass densities seen on her CT  scan from March 17, 2019 are residual from her infection.  No intervention is needed at this time. 3.  Thyroid nodule: Biopsy reported as benign.  Likely multinodular goiter. 4.  Left breast nodule: Bilateral mastectomy as above. No evidence of malignancy noted. 5.  Anemia: Resolved.   I provided 20 minutes of face-to-face video visit time during this encounter which included chart review, counseling, and coordination of care as documented above.   Patient expressed understanding and was in agreement with this plan. She also understands that She  can call clinic at any time with any questions, concerns, or complaints.   Cancer Staging Cancer of central portion of right female breast [facility]) Staging form: Breast, AJCC 8th Edition - Clinical stage from 01/13/2019: Stage IIA (cT2, cN1, cM0, G1, ER+, PR+, HER2-) - Signed by Lloyd Huger, MD on 01/13/2019   Lloyd Huger, MD   09/12/2020 2:43 PM

## 2020-09-12 ENCOUNTER — Other Ambulatory Visit: Payer: Self-pay

## 2020-09-12 ENCOUNTER — Inpatient Hospital Stay: Payer: Medicare PPO | Attending: Oncology | Admitting: Oncology

## 2020-09-12 ENCOUNTER — Telehealth: Payer: Self-pay

## 2020-09-12 ENCOUNTER — Encounter: Payer: Self-pay | Admitting: Oncology

## 2020-09-12 DIAGNOSIS — Z170 Estrogen receptor positive status [ER+]: Secondary | ICD-10-CM

## 2020-09-12 DIAGNOSIS — C50111 Malignant neoplasm of central portion of right female breast: Secondary | ICD-10-CM

## 2020-09-12 MED ORDER — LETROZOLE 2.5 MG PO TABS: 2.5000 mg | ORAL_TABLET | Freq: Every day | ORAL | 3 refills | Status: DC

## 2020-09-13 NOTE — Telephone Encounter (Signed)
NA

## 2020-10-13 NOTE — Addendum Note (Signed)
Addended by: [name] on: 10/13/2020 10:51 AM   Modules accepted: Level of Service

## 2020-10-26 DIAGNOSIS — Z1211 Encounter for screening for malignant neoplasm of colon: Secondary | ICD-10-CM | POA: Diagnosis not present

## 2020-11-01 LAB — COLOGUARD
COLOGUARD: NEGATIVE
Cologuard: NEGATIVE

## 2020-11-01 LAB — EXTERNAL GENERIC LAB PROCEDURE: COLOGUARD: NEGATIVE

## 2020-11-13 ENCOUNTER — Encounter: Payer: Self-pay | Admitting: Physician Assistant

## 2020-11-13 ENCOUNTER — Other Ambulatory Visit: Payer: Self-pay

## 2020-11-13 ENCOUNTER — Ambulatory Visit: Payer: Medicare PPO | Admitting: Physician Assistant

## 2020-11-13 VITALS — BP 124/73 | HR 57 | Temp 97.6°F | Wt 185.0 lb

## 2020-11-13 DIAGNOSIS — M25531 Pain in right wrist: Secondary | ICD-10-CM | POA: Diagnosis not present

## 2020-11-13 NOTE — Patient Instructions (Signed)
Wrist Fracture Rehab Ask your health care provider which exercises are safe for you. Do exercises exactly as told by your health care provider and adjust them as directed. It is normal to feel mild stretching, pulling, tightness, or discomfort as you do these exercises. Stop right away if you feel sudden pain or your pain gets worse. Do not begin these exercises until told by your health care provider. Stretching and range-of-motion exercises These exercises warm up your muscles and joints and improve the movement and flexibility of your wrist and hand. These exercises also help to relieve pain, numbness, and tingling. Finger flexion and extension 1. Sit or stand with your elbow at your side. 2. Open and stretch your left / right fingers as wide as you can (extension). 3. Hold this position for _________ seconds. 4. Close your left / right fingers into a gentle fist (flexion). 5. Hold this position for _________ seconds. 6. Slowly return to the starting position. Repeat __________ times. Complete this exercise __________ times a day. Wrist flexion 1. Bend your left / right elbow to a 90-degree angle (right angle) with your palm facing the floor. 2. Bend your wrist forward so your fingers point toward the floor (flexion). 3. Hold this position for ___________ seconds. 4. Slowly return to the starting position. Repeat __________ times. Complete this exercise __________ times a day. Wrist extension 1. Bend your left / right elbow to a 90-degree angle (right angle) with your palm facing the floor. 2. Bend your wrist backward so your fingers point toward the ceiling (extension). 3. Hold this position for ___________ seconds. 4. Slowly return to the starting position. Repeat __________ times. Complete this exercise __________ times a day. Ulnar deviation 1. Bend your left / right elbow to a 90-degree angle (right angle), and rest your forearm on a table with your palm facing down. 2. Keeping your  hand flat on the table, bend your left / right wrist toward your small finger (pinkie). This is ulnar deviation. 3. Hold this position for __________ seconds. 4. Slowly return to the starting position. Repeat __________ times. Complete this exercise __________ times a day. Radial deviation 1. Bend your left / right elbow to a 90-degree angle (right angle), and rest your forearm on a table with your palm facing down. 2. Keeping your hand flat on the table, bend your left / right wrist toward your thumb. This is radial deviation. 3. Hold this position for __________ seconds. 4. Slowly return to the starting position. Repeat __________ times. Complete this exercise __________ times a day. Forearm rotation, supination 1. Stand or sit with your left / right elbow bent to a 90-degree angle (right angle) at your side. Position your forearm so that the thumb is facing the ceiling (neutral position). 2. Turn (rotate) your palm up toward the ceiling (supination), stopping when you feel a gentle stretch. 3. Hold this position for __________ seconds. 4. Slowly return to the starting position. Repeat __________ times. Complete this exercise __________ times a day. Forearm rotation, pronation 1. Stand or sit with your left / right elbow bent to a 90-degree angle (right angle) at your side. Position your forearm so that the thumb is facing the ceiling (neutral position). 2. Turn (rotate) your palm down toward the floor (pronation), stopping when you feel a gentle stretch. 3. Hold this position for __________ seconds. 4. Slowly return to the starting position. Repeat __________ times. Complete this exercise __________ times a day. Wrist flexion stretch 1. Extend your left /  right arm in front of you and turn your palm down toward the floor. ? If told by your health care provider, bend your left / right arm to a 90-degree angle (right angle) at your side. 2. Using your uninjured hand, gently press over the  back of your left / right hand to bend your wrist and fingers toward the floor (flexion). Go as far as you can to feel a stretch without causing pain. 3. Hold this position for __________ seconds. 4. Slowly return to the starting position. Repeat __________ times. Complete this exercise __________ times a day.   Wrist extension stretch 1. Extend your left / right arm in front of you and turn your palm up toward the ceiling. ? If told by your health care provider, bend your left / right arm to a 90-degree angle (right angle) at your side. 2. Using your uninjured hand, gently press over the palm of your left / right hand to bend your wrist and fingers toward the floor (extension). Go as far as you can to feel a stretch without causing pain. 3. Hold this position for __________ seconds. 4. Slowly return to the starting position. Repeat __________ times. Complete this exercise __________ times a day.   Forearm rotation stretch, supination 1. Stand or sit with your arms at your sides. 2. Bend your left / right elbow to a 90-degree angle (right angle). 3. Using your uninjured hand, turn your left / right palm up toward the ceiling (assisted supination) until you feel a gentle stretch in the inside of your forearm. 4. Hold this position for __________ seconds. 5. Slowly return to the starting position. Repeat __________ times. Complete this exercise __________ times a day. Forearm rotation stretch, pronation 1. Stand or sit with your arms at your sides. 2. Bend your left / right elbow to a 90-degree angle (right angle). 3. Using your uninjured hand, turn your left / right palm down toward the floor (assisted pronation) until you feel a gentle stretch in the top of your forearm. 4. Hold this position for __________ seconds. 5. Slowly return to the starting position. Repeat __________ times. Complete this exercise __________ times a day. Strengthening exercises These exercises build strength and  endurance in your wrist and hand. Endurance is the ability to use your muscles for a long time, even after they get tired. Wrist flexion 1. Sit with your left / right forearm supported on a table. Your elbow should be at waist height. 2. Rest your hand over the edge of the table, palm up. 3. Gently grasp a __________ lb / kg weight. Or, hold an exercise band or tube in both hands, keeping your hands at the same level and hip distance apart. There should be slight tension in the exercise band or tube. 4. Without moving your forearm or elbow, slowly bend your wrist up toward the ceiling (wrist flexion). 5. Hold this position for __________ seconds. 6. Slowly return to the starting position. Repeat __________ times. Complete this exercise __________ times a day. Wrist extension 1. Sit with your left / right forearm supported on a table. Your elbow should be at waist height. 2. Rest your hand over the edge of the table, palm down. 3. Gently grasp a __________ lb / kg weight. Or, hold an exercise band or tube in both hands, keeping your hands at the same level and hip distance apart. There should be slight tension in the exercise band or tube. 4. Without moving your forearm or elbow,  slowly curl your hand up toward the ceiling (extension). 5. Hold this position for __________ seconds. 6. Slowly return to the starting position. Repeat __________ times. Complete this exercise __________ times a day. Forearm rotation, supination 1. Sit with your left / right forearm supported on a table. Your elbow should be at waist height. 2. Rest your hand over the edge of the table, palm down. 3. Gently grasp a lightweight hammer near the head. As this exercise gets easier for you, try holding the hammer farther down the handle. 4. Without moving your elbow, slowly turn (rotate) your palm up toward the ceiling (supination). 5. Hold this position for __________ seconds. 6. Slowly return to the starting  position. Repeat __________ times. Complete this exercise __________ times a day.   Forearm rotation, pronation 1. Sit with your left / right forearm supported on a table. Your elbow should be at waist height. 2. Rest your hand over the edge of the table, palm up. 3. Gently grasp a lightweight hammer near the head. As this exercise gets easier for you, try holding the hammer farther down the handle. 4. Without moving your elbow, slowly turn (rotate) your palm down toward the floor (pronation). 5. Hold this position for __________ seconds. 6. Slowly return to the starting position. Repeat __________ times. Complete this exercise __________ times a day.   Grip strengthening 1. Hold one of these items in your left / right hand: a dense sponge, a stress ball, or a large, rolled sock. 2. Slowly squeeze the object as hard as you can without increasing any pain. 3. Hold your squeeze for __________ seconds. 4. Slowly release your grip. Repeat __________ times. Complete this exercise __________ times a day.   This information is not intended to replace advice given to you by your health care provider. Make sure you discuss any questions you have with your health care provider. Document Revised: 01/13/2020 Document Reviewed: 01/13/2020 Elsevier Patient Education  [employer].

## 2020-11-13 NOTE — Progress Notes (Signed)
Established patient visit   Patient: Nichole Martinez   DOB: [birth year]   70 y.o. Female  MRN: [id number] Visit Date: 11/13/2020  Today's healthcare provider: [name], PA-C   Chief Complaint  Patient presents with  . Wrist Pain   Subjective    Wrist Pain  The pain is present in the right wrist. This is a new problem. The current episode started in the past 7 days. The problem occurs constantly. Pertinent negatives include no numbness.    Patient reports 4 days ago she was picking up a dog and felt immediate pain and swelling started. Since she has been unable to lift anything and has restricted movement in the wrist due to pain.    Patient Active Problem List   Diagnosis Date Noted  . Cancer of right female breast ([place]) 11/17/2019  . Goals of care, counseling/discussion 04/29/2019  . Cancer of central portion of right female breast ([place]) 01/13/2019  . Migraine 10/04/2015  . Hyperlipemia 04/05/2015  . Allergic rhinitis 01/31/2015  . Arthritis of knee, degenerative 01/31/2015  . Cannot sleep 01/31/2015  . Avitaminosis D 01/31/2015  . Adiposity 02/24/2009  . Diabetes mellitus, type 2 ([place]) 03/07/2008  . Adaptation reaction 02/19/2008  . Trigeminal neuralgia 01/16/2007  . Acid reflux 03/09/2003  . BP (high blood pressure) 01/28/2003   Past Medical History:  Diagnosis Date  . Allergy   . Breast cancer, right ([place]) 2020   Currently taking hormonal chemo tx's.   . Depression   . Diabetes mellitus without complication ([place]) 6568  . Dyspnea    due to weight  . Hyperlipidemia   . Hypertension   . Osteoporosis   . Trigeminal neuralgia    Social History   Tobacco Use  . Smoking status: Former Smoker    Quit date: 03/16/2000    Years since quitting: 20.6  . Smokeless tobacco: Never Used  Vaping Use  . Vaping Use: Never used  Substance Use Topics  . Alcohol use: No  . Drug use: No   Allergies  Allergen Reactions  . Morphine Anaphylaxis     Anaphallaxis.     Medications: Outpatient Medications Prior to Visit  Medication Sig  . alendronate (FOSAMAX) 70 MG tablet TAKE 1 TABLET BY MOUTH ONCE A WEEK ON AN EMPTY STOMACH WITH A FULL  GLASS  OF  WATER  . ALPRAZolam (XANAX) 1 MG tablet Take 1 tablet (1 mg total) by mouth at bedtime as needed for anxiety.  Marland Kitchen amLODipine (NORVASC) 5 MG tablet Take 1 tablet (5 mg total) by mouth daily.  . baclofen (LIORESAL) 10 MG tablet Take 1 tablet (10 mg total) by mouth daily.  . bisoprolol-hydrochlorothiazide (ZIAC) 10-6.25 MG tablet Take 1 tablet by mouth 2 (two) times daily.  Marland Kitchen buPROPion (WELLBUTRIN XL) 150 MG 24 hr tablet Take 1 tablet (150 mg total) by mouth 2 (two) times daily. (Patient taking differently: Take 150 mg by mouth daily.)  . celecoxib (CELEBREX) 200 MG capsule   . Cholecalciferol (VITAMIN D3) 5000 units CAPS Take by mouth.  . letrozole (FEMARA) 2.5 MG tablet Take 1 tablet (2.5 mg total) by mouth daily.  Marland Kitchen lisinopril-hydrochlorothiazide (ZESTORETIC) 10-12.5 MG tablet Take 1 tablet by mouth daily.  Marland Kitchen loratadine (EQ LORATADINE) 10 MG tablet Take 1 tablet (10 mg total) by mouth daily as needed.  . metFORMIN (GLUCOPHAGE) 1000 MG tablet Take 1 tablet (1,000 mg total) by mouth 2 (two) times daily with a meal.  . MULTIPLE VITAMINS  PO MULTIVITAMINS (Oral Tablet)  1 Every Day for 0 days  Quantity: 0.00;  Refills: 0   Ordered :17-Apr-2011  [name] ;  Started 24-February-2009 Active  . OMEGA 3-6-9 FATTY ACIDS PO Reported on 10/04/2015  . rizatriptan (MAXALT) 10 MG tablet Take 1 tablet (10 mg total) by mouth as needed for migraine. May repeat in 2 hours if needed  . simvastatin (ZOCOR) 20 MG tablet Take 1 tablet (20 mg total) by mouth at bedtime. (Patient not taking: Reported on 09/12/2020)  . Vitamin D, Ergocalciferol, (DRISDOL) 1.25 MG (50000 UNIT) CAPS capsule Take 1 capsule (50,000 Units total) by mouth every 7 (seven) days.  . vitamin E 1000 UNIT capsule Take 1,000 Units by mouth daily.    Facility-Administered Medications Prior to Visit  Medication Dose Route Frequency Provider  . heparin lock flush 100 unit/mL  500 Units Intravenous Once Lloyd Huger, MD  . sodium chloride flush (NS) 0.9 % injection 10 mL  10 mL Intravenous Once Lloyd Huger, MD    Review of Systems  Constitutional: Negative.   Respiratory: Negative.   Cardiovascular: Negative.   Musculoskeletal: Positive for arthralgias and joint swelling.  Neurological: Positive for weakness. Negative for numbness.        Objective    BP 124/73 (BP Location: Left Arm, Patient Position: Sitting, Cuff Size: Large)   Pulse (!) 57   Temp 97.6 F (36.4 C) (Oral)   Wt 185 lb (83.9 kg)   BMI 32.77 kg/m    Physical Exam Vitals reviewed.  Constitutional:      General: She is not in acute distress.    Appearance: Normal appearance. She is well-developed and well-nourished. She is not ill-appearing.  HENT:     Head: Normocephalic and atraumatic.  Eyes:     Extraocular Movements: EOM normal.  Cardiovascular:     Pulses: Normal pulses.  Pulmonary:     Effort: Pulmonary effort is normal. No respiratory distress.  Musculoskeletal:     Right wrist: Swelling and tenderness present. No deformity, bony tenderness or snuff box tenderness. Decreased range of motion. Normal pulse.     Left wrist: Normal.       Arms:     Cervical back: Normal range of motion and neck supple.  Skin:    General: Skin is warm and dry.     Findings: No erythema.  Neurological:     Mental Status: She is alert.  Psychiatric:        Mood and Affect: Mood and affect normal.        Behavior: Behavior normal.        Thought Content: Thought content normal.        Judgment: Judgment normal.       No results found for any visits on 11/13/20.  Assessment & Plan     1. Right wrist pain Will get imaging as below to r/o fracture or bony injury. Possibly a sprain of the wrist. Exercises and stretches in AVS. Patient will  continue tylenol and IBU alternating for now. Discussed cock up wrist splint while awake to brace. Ice. Call if worsening. I will f/u pending images. - DG Wrist Complete Right; Future   No follow-ups on file.      Reynolds Bowl, PA-C, have reviewed all documentation for this visit. The documentation on 11/14/20 for the exam, diagnosis, procedures, and orders are all accurate and complete.   [name]  [facility] [phone] (phone) [phone] (  fax)  [place]

## 2020-11-14 ENCOUNTER — Ambulatory Visit
Admission: RE | Admit: 2020-11-14 | Discharge: 2020-11-14 | Disposition: A | Discharge status: Home / Self Care | Payer: Medicare PPO | Source: Ambulatory Visit | Attending: Physician Assistant | Admitting: Physician Assistant

## 2020-11-14 ENCOUNTER — Encounter: Payer: Self-pay | Admitting: Physician Assistant

## 2020-11-14 ENCOUNTER — Ambulatory Visit: Payer: Medicare PPO | Admitting: Physician Assistant

## 2020-11-14 DIAGNOSIS — M25531 Pain in right wrist: Secondary | ICD-10-CM | POA: Insufficient documentation

## 2020-12-14 ENCOUNTER — Ambulatory Visit
Admission: RE | Admit: 2020-12-14 | Discharge: 2020-12-14 | Disposition: A | Discharge status: Home / Self Care | Payer: Medicare PPO | Source: Ambulatory Visit | Attending: Oncology | Admitting: Oncology

## 2020-12-14 ENCOUNTER — Other Ambulatory Visit: Payer: Self-pay

## 2020-12-14 DIAGNOSIS — Z13820 Encounter for screening for osteoporosis: Secondary | ICD-10-CM | POA: Diagnosis not present

## 2020-12-14 DIAGNOSIS — Z9221 Personal history of antineoplastic chemotherapy: Secondary | ICD-10-CM | POA: Diagnosis not present

## 2020-12-14 DIAGNOSIS — Z780 Asymptomatic menopausal state: Secondary | ICD-10-CM | POA: Diagnosis not present

## 2020-12-14 DIAGNOSIS — Z170 Estrogen receptor positive status [ER+]: Secondary | ICD-10-CM | POA: Insufficient documentation

## 2020-12-14 DIAGNOSIS — C50111 Malignant neoplasm of central portion of right female breast: Secondary | ICD-10-CM | POA: Diagnosis not present

## 2020-12-14 DIAGNOSIS — M8589 Other specified disorders of bone density and structure, multiple sites: Secondary | ICD-10-CM | POA: Diagnosis not present

## 2020-12-14 DIAGNOSIS — E119 Type 2 diabetes mellitus without complications: Secondary | ICD-10-CM | POA: Insufficient documentation

## 2021-01-10 ENCOUNTER — Ambulatory Visit: Payer: Self-pay | Admitting: *Deleted

## 2021-01-10 NOTE — Telephone Encounter (Signed)
Patient is calling to request an appointment- when advised this would have to be virtual- she said no thanks- I will go to clinic here in [place] after work. She was not pleased with having to do virtual. 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  Reason for Disposition . [1] Continuous (nonstop) coughing interferes with work or school AND [2] no improvement using cough treatment per protocol  Answer Assessment - Initial Assessment Questions 1. ONSET: "When did the cough begin?"      Started early this morning 2. SEVERITY: "How bad is the cough today?"      Every hour coughing spells 3. SPUTUM: "Describe the color of your sputum" (none, dry cough; clear, white, yellow, green)     No sputum 4. HEMOPTYSIS: "Are you coughing up any blood?" If so ask: "How much?" (flecks, streaks, tablespoons, etc.)     no 5. DIFFICULTY BREATHING: "Are you having difficulty breathing?" If Yes, ask: "How bad is it?" (e.g., mild, moderate, severe)    - MILD: No SOB at rest, mild SOB with walking, speaks normally in sentences, can lay down, no retractions, pulse < 100.    - MODERATE: SOB at rest, SOB with minimal exertion and prefers to sit, cannot lie down flat, speaks in phrases, mild retractions, audible wheezing, pulse 100-120.    - SEVERE: Very SOB at rest, speaks in single words, struggling to breathe, sitting hunched forward, retractions, pulse > 120      Just after the cough 6. FEVER: "Do you have a fever?" If Yes, ask: "What is your temperature, how was it measured, and when did it start?"     no 7. CARDIAC HISTORY: "Do you have any history of heart disease?" (e.g., heart attack, congestive heart failure)      High BP 8. LUNG HISTORY: "Do you have any history of lung disease?"  (e.g., pulmonary embolus, asthma, emphysema)     no 9. PE RISK FACTORS: "Do you have a history of blood clots?" (or: recent major surgery, recent prolonged travel, bedridden)     no 10. OTHER  SYMPTOMS: "Do you have any other symptoms?" (e.g., runny nose, wheezing, chest pain)       Nasal congestions, wheezing after cough 11. PREGNANCY: "Is there any chance you are pregnant?" "When was your last menstrual period?"       n/a 12. TRAVEL: "Have you traveled out of the country in the last month?" (e.g., travel history, exposures)       no  Protocols used: COUGH - ACUTE NON-PRODUCTIVE-A-AH

## 2021-01-18 DIAGNOSIS — Z170 Estrogen receptor positive status [ER+]: Secondary | ICD-10-CM | POA: Diagnosis not present

## 2021-01-18 DIAGNOSIS — C50111 Malignant neoplasm of central portion of right female breast: Secondary | ICD-10-CM | POA: Diagnosis not present

## 2021-01-18 DIAGNOSIS — I10 Essential (primary) hypertension: Secondary | ICD-10-CM | POA: Diagnosis not present

## 2021-01-18 DIAGNOSIS — M8580 Other specified disorders of bone density and structure, unspecified site: Secondary | ICD-10-CM | POA: Diagnosis not present

## 2021-01-18 DIAGNOSIS — R059 Cough, unspecified: Secondary | ICD-10-CM | POA: Diagnosis not present

## 2021-01-18 DIAGNOSIS — G4700 Insomnia, unspecified: Secondary | ICD-10-CM | POA: Diagnosis not present

## 2021-01-18 DIAGNOSIS — Z6831 Body mass index (BMI) 31.0-31.9, adult: Secondary | ICD-10-CM | POA: Diagnosis not present

## 2021-01-18 DIAGNOSIS — G509 Disorder of trigeminal nerve, unspecified: Secondary | ICD-10-CM | POA: Diagnosis not present

## 2021-03-10 NOTE — Progress Notes (Deleted)
[facility]  Telephone:(336) [phone] Fax:(336) [phone]  ID: Nichole Martinez OB: 1950/09/21  MR#: [id number]  FVO#:[id number]  Patient Care Team: [name] as PCP - General (Family Medicine) Rico Junker, RN as Registered Nurse Marlyn Corporal Clearnce Sorrel, PA-C as Physician Assistant (Family Medicine) Lloyd Huger, MD as Consulting Physician (Oncology) Jovita Kussmaul, MD as Consulting Physician (General Surgery)  I connected with Nichole Martinez on 03/10/21 at  2:00 PM EDT by {Blank single:19197::"video enabled telemedicine visit","telephone visit"} and verified that I am speaking with the correct person using two identifiers.   I discussed the limitations, risks, security and privacy concerns of performing an evaluation and management service by telemedicine and the availability of in-person appointments. I also discussed with the patient that there may be a patient responsible charge related to this service. The patient expressed understanding and agreed to proceed.   Other persons participating in the visit and their role in the encounter: Patient, MD.  Patient's location: Home. Provider's location: Clinic.   CHIEF COMPLAINT: Clinical stage IIa ER/PR positive, HER-2 negative invasive carcinoma of the central portion of the right breast.  INTERVAL HISTORY: Patient agreed to video assisted telemedicine visit for routine 6 month evaluation.  She recently had bilateral knee replacement and is fully recovered.  She is tolerating letrozole without significant side effects.  She currently feels well and is asymptomatic. She has no neurologic complaints. She denies any recent fevers or illnesses. She has a good appetite and denies weight loss.  She has no chest pain, shortness of breath, cough, or hemoptysis. She denies any nausea, vomiting, constipation, or diarrhea.  She has no urinary complaints. She has no further joint pain. Patient offers no  specific complaints today.  REVIEW OF SYSTEMS:   Review of Systems  Constitutional: Negative.  Negative for fever, malaise/fatigue and weight loss.  Respiratory: Negative.  Negative for cough, hemoptysis and shortness of breath.   Cardiovascular: Negative.  Negative for chest pain and leg swelling.  Gastrointestinal: Negative.  Negative for abdominal pain, diarrhea and nausea.  Genitourinary: Negative.  Negative for dysuria.  Musculoskeletal: Negative.  Negative for back pain and myalgias.  Skin: Negative.  Negative for rash.  Neurological: Negative.  Negative for focal weakness, weakness and headaches.  Psychiatric/Behavioral: Negative.  The patient is not nervous/anxious.    As per HPI. Otherwise, a complete review of systems is negative.  PAST MEDICAL HISTORY: Past Medical History:  Diagnosis Date   Allergy    Breast cancer, right ([place]) 2020   Currently taking hormonal chemo tx's.    Depression    Diabetes mellitus without complication ([place]) 0352   Dyspnea    due to weight   Hyperlipidemia    Hypertension    Osteoporosis    Trigeminal neuralgia     PAST SURGICAL HISTORY: Past Surgical History:  Procedure Laterality Date   ABDOMINAL HYSTERECTOMY  1978   BREAST BIOPSY Right 01/06/2019   [place] bx x 2.  Ribbon marker for mass and hydro for Lymph node. Path pending   BREAST CYST EXCISION Right 1986   BREAST EXCISIONAL BIOPSY     IR IMAGING GUIDED PORT INSERTION  05/12/2019   LAMINECTOMY  1995   L1, L2, L3   MASTECTOMY WITH AXILLARY LYMPH NODE DISSECTION Bilateral 11/17/2019   Procedure: BILATERAL MASTECTOMIES WITH RIGHT TARGETED SEED NODE DISSECTION;  Surgeon: Jovita Kussmaul, MD;  Location: [facility];  Service: General;  Laterality: Bilateral;   PORT-A-CATH REMOVAL  Left 02/25/2020   Procedure: REMOVAL PORT-A-CATH;  Surgeon: Jovita Kussmaul, MD;  Location: [facility];  Service: General;  Laterality: Left;   REPLACEMENT TOTAL KNEE Left 04/28/2020    REPLACEMENT TOTAL KNEE Right 06/21/2020   SENTINEL NODE BIOPSY Right 11/17/2019   Procedure: RIGHT SENTINEL NODE BIOPSY;  Surgeon: Jovita Kussmaul, MD;  Location: [facility];  Service: General;  Laterality: Right;   SPINE SURGERY      FAMILY HISTORY: Family History  Adopted: Yes  Family history unknown: Yes    ADVANCED DIRECTIVES (Y/N):  N  HEALTH MAINTENANCE: Social History   Tobacco Use   Smoking status: Former    Pack years: 0.00    Types: Cigarettes    Quit date: 03/16/2000    Years since quitting: 20.9   Smokeless tobacco: Never  Vaping Use   Vaping Use: Never used  Substance Use Topics   Alcohol use: No   Drug use: No     Colonoscopy:  PAP:  Bone density:  Lipid panel:  Allergies  Allergen Reactions   Morphine Anaphylaxis    Anaphallaxis.    Current Outpatient Medications  Medication Sig Dispense Refill   alendronate (FOSAMAX) 70 MG tablet TAKE 1 TABLET BY MOUTH ONCE A WEEK ON AN EMPTY STOMACH WITH A FULL  GLASS  OF  WATER 12 tablet 3   ALPRAZolam (XANAX) 1 MG tablet Take 1 tablet (1 mg total) by mouth at bedtime as needed for anxiety. 90 tablet 1   amLODipine (NORVASC) 5 MG tablet Take 1 tablet (5 mg total) by mouth daily. 90 tablet 3   baclofen (LIORESAL) 10 MG tablet Take 1 tablet (10 mg total) by mouth daily. 90 tablet 3   bisoprolol-hydrochlorothiazide (ZIAC) 10-6.25 MG tablet Take 1 tablet by mouth 2 (two) times daily. 180 tablet 3   buPROPion (WELLBUTRIN XL) 150 MG 24 hr tablet Take 1 tablet (150 mg total) by mouth 2 (two) times daily. (Patient taking differently: Take 150 mg by mouth daily.) 180 tablet 3   celecoxib (CELEBREX) 200 MG capsule      Cholecalciferol (VITAMIN D3) 5000 units CAPS Take by mouth.     letrozole (FEMARA) 2.5 MG tablet Take 1 tablet (2.5 mg total) by mouth daily. 90 tablet 3   lisinopril-hydrochlorothiazide (ZESTORETIC) 10-12.5 MG tablet Take 1 tablet by mouth daily. 90 tablet 3   loratadine (EQ LORATADINE) 10 MG  tablet Take 1 tablet (10 mg total) by mouth daily as needed. 90 tablet 3   metFORMIN (GLUCOPHAGE) 1000 MG tablet Take 1 tablet (1,000 mg total) by mouth 2 (two) times daily with a meal. 180 tablet 3   MULTIPLE VITAMINS PO MULTIVITAMINS (Oral Tablet)  1 Every Day for 0 days  Quantity: 0.00;  Refills: 0   Ordered :17-Apr-2011  [name] ;  Started 24-February-2009 Active     OMEGA 3-6-9 FATTY ACIDS PO Reported on 10/04/2015     rizatriptan (MAXALT) 10 MG tablet Take 1 tablet (10 mg total) by mouth as needed for migraine. May repeat in 2 hours if needed 10 tablet 11   simvastatin (ZOCOR) 20 MG tablet Take 1 tablet (20 mg total) by mouth at bedtime. (Patient not taking: Reported on 09/12/2020) 90 tablet 3   Vitamin D, Ergocalciferol, (DRISDOL) 1.25 MG (50000 UNIT) CAPS capsule Take 1 capsule (50,000 Units total) by mouth every 7 (seven) days. 12 capsule 4   vitamin E 1000 UNIT capsule Take 1,000 Units by mouth daily.  No current facility-administered medications for this visit.   Facility-Administered Medications Ordered in Other Visits  Medication Dose Route Frequency Provider Last Rate Last Admin   heparin lock flush 100 unit/mL  500 Units Intravenous Once Lloyd Huger, MD       sodium chloride flush (NS) 0.9 % injection 10 mL  10 mL Intravenous Once Grayland Ormond, Kathlene November, MD      Graduate for companion yesterday dog  OBJECTIVE: There were no vitals filed for this visit.   There is no height or weight on file to calculate BMI.    ECOG FS:0 - Asymptomatic  General: Well-developed, well-nourished, no acute distress. HEENT: Normocephalic. Neuro: Alert, answering all questions appropriately. Cranial nerves grossly intact. Psych: Normal affect.   LAB RESULTS:  Lab Results  Component Value Date   NA 139 08/04/2020   K 4.5 08/04/2020   CL 99 08/04/2020   CO2 25 08/04/2020   GLUCOSE 94 08/04/2020   BUN 20 08/04/2020   CREATININE 0.76 08/04/2020   CALCIUM 10.1 08/04/2020   PROT 7.0  08/04/2020   ALBUMIN 4.7 08/04/2020   AST 17 08/04/2020   ALT 12 08/04/2020   ALKPHOS 90 08/04/2020   BILITOT 0.4 08/04/2020   GFRNONAA 82 08/04/2020   GFRAA 95 08/04/2020    Lab Results  Component Value Date   WBC 6.9 08/04/2020   NEUTROABS 2.9 08/04/2020   HGB 12.5 08/04/2020   HCT 39.5 08/04/2020   MCV 79 08/04/2020   PLT 286 08/04/2020     STUDIES: No results found.  ASSESSMENT: Clinical stage IIa ER/PR positive, HER-2 negative invasive carcinoma of the central portion of the right breast.  Oncotype DX score 7.  PLAN:   1. Clinical stage IIa ER/PR positive, HER-2 negative invasive carcinoma of the central portion of the right breast: Although patient was a clinical stage IIa and typically neoadjuvant chemotherapy is the recommendation, she initially did not wish to pursue aggressive immunosuppressive treatment during the COVID-19 pandemic.  Instead, patient received neoadjuvant letrozole from April 2020 through August 2020.  She then proceeded with neoadjuvant chemotherapy using Adriamycin, Cytoxan with Udenyca support followed by weekly Taxol x12. Patient completed her chemotherapy on September 30, 2019. She subsequently underwent bilateral mastectomy on November 17, 2019. Residual disease was noted in the breast and patient had evidence of micrometastasis in one lymph node. Because of her bilateral mastectomy, she did not require adjuvant XRT. Continue letrozole for a minimum of 5 years through April 2025. Although given her high risk disease, will likely extend treatment 7 to 10 years.  No intervention is needed at this time.  Patient will have video assisted telemedicine visit in 6 months.  2.  Osteopenia: Bone mineral density on December 14, 2019 reported T score of -1.9.  Continue calcium and vitamin D supplementation.  It appears patient is also taking Fosamax. Repeat BMD in March 2022.  3.  COVID positivity: Resolved.  Patient reports repeat testing is negative. It is likely that the  groundglass densities seen on her CT scan from March 17, 2019 are residual from her infection.  No intervention is needed at this time. 3.  Thyroid nodule: Biopsy reported as benign.  Likely multinodular goiter. 4.  Left breast nodule: Bilateral mastectomy as above. No evidence of malignancy noted. 5.  Anemia: Resolved.   I provided *** minutes of {Blank single:19197::"face-to-face video visit time","non face-to-face telephone visit time"} during this encounter which included chart review, counseling, and coordination of care as documented above.  Patient expressed understanding and was in agreement with this plan. She also understands that She can call clinic at any time with any questions, concerns, or complaints.   Cancer Staging Cancer of central portion of right female breast [facility]) Staging form: Breast, AJCC 8th Edition - Clinical stage from 01/13/2019: Stage IIA (cT2, cN1, cM0, G1, ER+, PR+, HER2-) - Signed by Lloyd Huger, MD on 01/13/2019 Histologic grading system: 3 grade system   Lloyd Huger, MD   03/10/2021 4:46 PM

## 2021-03-13 ENCOUNTER — Inpatient Hospital Stay: Payer: Medicare PPO | Admitting: Oncology

## 2021-04-03 NOTE — Progress Notes (Signed)
  [place]  [phone] Fax:(336) [phone]  ID: Nichole Martinez OB: 1951/05/18  MR#: [id number]  WSF#:[id number]  Patient Care Team: [name] as PCP - General (Family Medicine) Rico Junker, RN as Registered Nurse Marlyn Corporal Clearnce Sorrel, PA-C as Physician Assistant (Family Medicine) Lloyd Huger, MD as Consulting Physician (Oncology) Jovita Kussmaul, MD as Consulting Physician (General Surgery)   Lloyd Huger, MD   04/03/2021 10:24 PM    This encounter was created in error - please disregard.

## 2021-04-05 ENCOUNTER — Encounter: Payer: Self-pay | Admitting: Oncology

## 2021-04-05 ENCOUNTER — Inpatient Hospital Stay: Payer: Medicare PPO | Attending: Oncology | Admitting: Oncology

## 2021-04-05 DIAGNOSIS — C50111 Malignant neoplasm of central portion of right female breast: Secondary | ICD-10-CM

## 2021-04-05 DIAGNOSIS — Z170 Estrogen receptor positive status [ER+]: Secondary | ICD-10-CM

## 2021-04-05 NOTE — Progress Notes (Signed)
RN called and verified pt name and date of birth.  Pt denies any concerns, states has been doing really well.  States she has lost 100 lbs, and it has made a huge difference in how see feels.  Pt also reports recent knee replacement surgery.

## 2021-04-09 ENCOUNTER — Encounter: Payer: Self-pay | Admitting: Oncology

## 2021-05-08 ENCOUNTER — Other Ambulatory Visit: Payer: Self-pay | Admitting: Physician Assistant

## 2021-05-08 DIAGNOSIS — F4322 Adjustment disorder with anxiety: Secondary | ICD-10-CM

## 2021-05-11 ENCOUNTER — Other Ambulatory Visit: Payer: Self-pay | Admitting: Family Medicine

## 2021-05-11 NOTE — Telephone Encounter (Signed)
Contacted patient at 4:04 no answer received. LVMTCB so that we could further clarify information for her refill request.

## 2021-05-11 NOTE — Telephone Encounter (Signed)
Pt called back to report that she does not need this medication any more. Will call back Monday.

## 2021-05-11 NOTE — Telephone Encounter (Signed)
Contacted patient at 4:04 pm. No answer received. LVMTCB so that we could further clarify information to handle her refill request.

## 2021-06-08 IMAGING — NM NM CARDIA MUGA REST
6 series · 38 of 38 positions shown · non-contrast
Comparison: 05/05/2019

CLINICAL DATA: Breast cancer, on cardiotoxic chemotherapy

EXAM:
NUCLEAR MEDICINE CARDIAC BLOOD POOL IMAGING (MUGA)
TECHNIQUE: Cardiac multi-gated acquisition was performed at rest following
intravenous injection of Gc-11m labeled red blood cells.
RADIOPHARMACEUTICALS:  22.87 mCi Gc-11m pertechnetate in-vitro
labeled red blood cells IV

[Series 1000: 45 lao-gated · 3.30mm/px · 6 of 23 frames shown]
[frame 2/23]
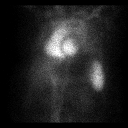
[frame 6/23]
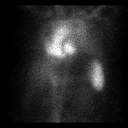
[frame 10/23]
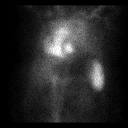
[frame 14/23]
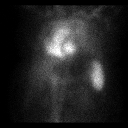
[frame 18/23]
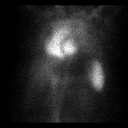
[frame 22/23]
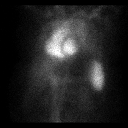

[Series 1000: 45 lao-gated (results) · 3.30mm/px · 6 of 24 frames shown]
[frame 3/24]
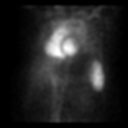
[frame 7/24]
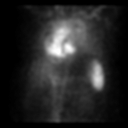
[frame 11/24]
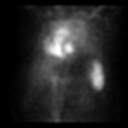
[frame 15/24]
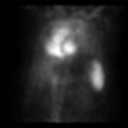
[frame 19/24]
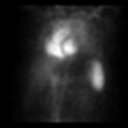
[frame 23/24]
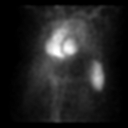

[Series 1000: 45 lao-gated (functional) · 3.30mm/px · 8 of 8 slices shown]
[im 1/8]
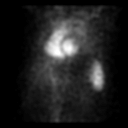
[im 2/8]
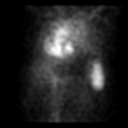
[im 3/8]
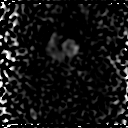
[im 4/8  full-range]
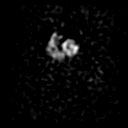
[im 5/8  full-range]
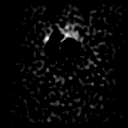
[im 6/8  full-range]
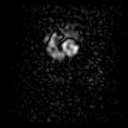
[im 7/8]
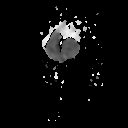
[im 8/8]
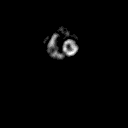

[Series 1000: anterior-gated · 3.30mm/px · 6 of 24 frames shown]
[frame 3/24]
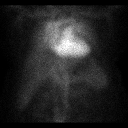
[frame 7/24]
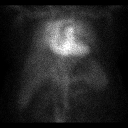
[frame 11/24]
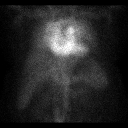
[frame 15/24]
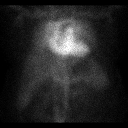
[frame 19/24]
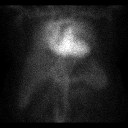
[frame 23/24]
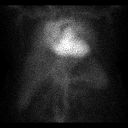

[Series 1000: 70 degree-gated · 3.30mm/px · 6 of 24 frames shown]
[frame 3/24]
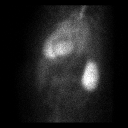
[frame 7/24]
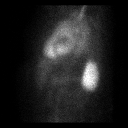
[frame 11/24]
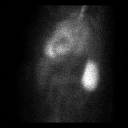
[frame 15/24]
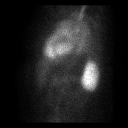
[frame 19/24]
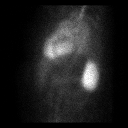
[frame 23/24]
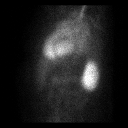

[Series 1000: 45 lao-gated (original with roi) · 3.30mm/px · 6 of 23 frames shown]
[frame 2/23]
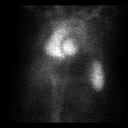
[frame 6/23]
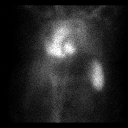
[frame 10/23]
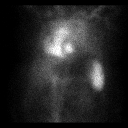
[frame 14/23]
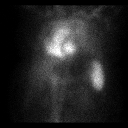
[frame 18/23]
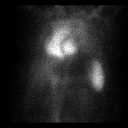
[frame 22/23]
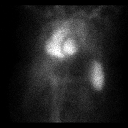

[38 of 38 positions shown; findings below may reference images not displayed]

FINDINGS: Calculated LEFT ventricular ejection fraction is 52.2%, not
significantly changed from the 52.8% on the previous exam.

Study was obtained at a cardiac rate of 56 bpm.

Patient was rhythmic during imaging.

Cine analysis of the LEFT ventricle in 3 projections demonstrates
normal LEFT ventricular wall motion.
IMPRESSION: Stable LEFT ventricular ejection fraction of 52.2% not significantly
changed from the 52.8% on the prior study.

No focal wall motion abnormalities identified.

## 2021-08-05 ENCOUNTER — Other Ambulatory Visit: Payer: Self-pay | Admitting: Physician Assistant

## 2021-08-05 DIAGNOSIS — E559 Vitamin D deficiency, unspecified: Secondary | ICD-10-CM

## 2021-08-05 DIAGNOSIS — E7800 Pure hypercholesterolemia, unspecified: Secondary | ICD-10-CM

## 2021-08-05 DIAGNOSIS — Z853 Personal history of malignant neoplasm of breast: Secondary | ICD-10-CM

## 2021-08-05 DIAGNOSIS — G43109 Migraine with aura, not intractable, without status migrainosus: Secondary | ICD-10-CM

## 2021-08-05 DIAGNOSIS — M85851 Other specified disorders of bone density and structure, right thigh: Secondary | ICD-10-CM

## 2021-08-05 DIAGNOSIS — E119 Type 2 diabetes mellitus without complications: Secondary | ICD-10-CM

## 2021-08-05 DIAGNOSIS — I10 Essential (primary) hypertension: Secondary | ICD-10-CM

## 2021-08-06 ENCOUNTER — Encounter: Payer: Medicare PPO | Admitting: Family Medicine

## 2021-08-06 ENCOUNTER — Ambulatory Visit: Payer: Medicare PPO | Admitting: Physician Assistant

## 2021-08-06 NOTE — Telephone Encounter (Signed)
Requested medication (s) are due for refill today:   Yes for all 9 rx  Requested medication (s) are on the active medication list:   Yes for all 9  Future visit scheduled:   Yes with Ria Comment 09/19/2021  Former pt of [employer]   Last ordered: See pt's note attached to this message.   Her last 3 appts have been canceled by the practice.   She was told her prescriptions would be refilled but they have not and she is out.   Per the note she is upset.  Returning these for provider review since she has not be seen by another provider since Fenton Malling and was told her prescriptions would be refilled and were not.         Requested Prescriptions  Pending Prescriptions Disp Refills   Vitamin D, Ergocalciferol, (DRISDOL) 1.25 MG (50000 UNIT) CAPS capsule [Pharmacy Med Name: Vitamin D (Ergocalciferol) 1.25 MG (50000 UT) Oral Capsule] 12 capsule 0    Sig: Take 1 capsule by mouth once a week     Endocrinology:  Vitamins - Vitamin D Supplementation Failed - 08/06/2021  8:32 AM      Failed - 50,000 IU strengths are not delegated      Failed - Ca in normal range and within 360 days    Calcium  Date Value Ref Range Status  08/04/2020 10.1 8.7 - 10.3 mg/dL Final          Failed - Phosphate in normal range and within 360 days    No results found for: PHOS        Failed - Vitamin D in normal range and within 360 days    Vit D, 25-Hydroxy  Date Value Ref Range Status  08/04/2020 75.7 30.0 - 100.0 ng/mL Final    Comment:    Vitamin D deficiency has been defined by the Institute of Medicine and an Endocrine Society practice guideline as a level of serum 25-OH vitamin D less than 20 ng/mL (1,2). The Endocrine Society went on to further define vitamin D insufficiency as a level between 21 and 29 ng/mL (2). 1. IOM (Institute of Medicine). 2010. Dietary reference    intakes for calcium and D. [place]: The    [employer]. 2. Holick MF, Binkley [place], Bischoff-Ferrari HA,  et al.    Evaluation, treatment, and prevention of vitamin D    deficiency: an Endocrine Society clinical practice    guideline. JCEM. 2011 Jul; 96(7):1911-30.           Passed - Valid encounter within last 12 months    Recent Outpatient Visits           8 months ago Right wrist pain   [facility] [place], Clearnce Sorrel, [place]   1 year ago Osteoarthritis of knee, unspecified laterality, unspecified osteoarthritis type   [place], [place]   1 year ago Osteopenia of neck of right femur   [facility] [place], Clearnce Sorrel, [place]   2 years ago Essential hypertension   [place], [place], [place]   2 years ago Type 2 diabetes mellitus without complication, without long-term current use of insulin [facility])   [place], Clearnce Sorrel, [place]       Future Appointments             In 1 month [employer], [place]  rizatriptan (MAXALT) 10 MG tablet [Pharmacy Med Name: Rizatriptan Benzoate 10 MG Oral Tablet] 10 tablet 0    Sig: TAKE ONE TABLET BY MOUTH AS NEEDED FOR MIGRAINE-MAY REPEAT IN 2 HOURS IF NEEDED     Neurology:  Migraine Therapy - Triptan Passed - 08/06/2021  8:32 AM      Passed - Last BP in normal range    BP Readings from Last 1 Encounters:  11/13/20 124/73          Passed - Valid encounter within last 12 months    Recent Outpatient Visits           8 months ago Right wrist pain   [facility] [place], Clearnce Sorrel, [place]   1 year ago Osteoarthritis of knee, unspecified laterality, unspecified osteoarthritis type   [place], Clearnce Sorrel, PA-C   1 year ago Osteopenia of neck of right femur   [facility] [place], [place], [place]   2 years ago Essential hypertension   [place], [place], [place]   2 years ago Type 2 diabetes mellitus without complication,  without long-term current use of insulin ([place])   [place], Clearnce Sorrel, [place]       Future Appointments             In 1 month [place], [facility]              alendronate (FOSAMAX) 70 MG tablet [Pharmacy Med Name: Alendronate Sodium 70 MG Oral Tablet] 12 tablet 0    Sig: TAKE ONE TABLET BY MOUTH ONCE A WEEK ON AN EMPTY STOMACH  WITH A FULL GLASS OF WATER     Endocrinology:  Bisphosphonates Failed - 08/06/2021  8:32 AM      Failed - Ca in normal range and within 360 days    Calcium  Date Value Ref Range Status  08/04/2020 10.1 8.7 - 10.3 mg/dL Final          Failed - Vitamin D in normal range and within 360 days    Vit D, 25-Hydroxy  Date Value Ref Range Status  08/04/2020 75.7 30.0 - 100.0 ng/mL Final    Comment:    Vitamin D deficiency has been defined by the Institute of Medicine and an Endocrine Society practice guideline as a level of serum 25-OH vitamin D less than 20 ng/mL (1,2). The Endocrine Society went on to further define vitamin D insufficiency as a level between 21 and 29 ng/mL (2). 1. IOM (Institute of Medicine). 2010. Dietary reference    intakes for calcium and D. [place]: The    [employer]. 2. Holick MF, Binkley [place], Bischoff-Ferrari HA, et al.    Evaluation, treatment, and prevention of vitamin D    deficiency: an Endocrine Society clinical practice    guideline. JCEM. 2011 Jul; 96(7):1911-30.           Passed - Valid encounter within last 12 months    Recent Outpatient Visits           8 months ago Right wrist pain   [place], [place]   1 year ago Osteoarthritis of knee, unspecified laterality, unspecified osteoarthritis type   [place], [place]   1 year ago Osteopenia of neck of right femur   [facility] [place], Clearnce Sorrel, [place]   2 years ago Essential hypertension   [facility]  Fenton Malling  M, PA-C   2 years ago Type 2 diabetes mellitus without complication, without long-term current use of insulin ([place])   [place], [place]       Future Appointments             In 1 month [place], [facility]              bisoprolol-hydrochlorothiazide [facility]) 10-6.25 MG tablet [Pharmacy Med Name: Bisoprolol-hydroCHLOROthiazide 10-6.25 MG Oral Tablet] 180 tablet 0    Sig: Take 1 tablet by mouth twice daily     Cardiovascular: Beta Blocker + Diuretic Combos Failed - 08/06/2021  8:32 AM      Failed - K in normal range and within 180 days    Potassium  Date Value Ref Range Status  08/04/2020 4.5 3.5 - 5.2 mmol/L Final          Failed - Na in normal range and within 180 days    Sodium  Date Value Ref Range Status  08/04/2020 139 134 - 144 mmol/L Final          Failed - Cr in normal range and within 180 days    Creatinine, Ser  Date Value Ref Range Status  08/04/2020 0.76 0.57 - 1.00 mg/dL Final          Failed - Ca in normal range and within 180 days    Calcium  Date Value Ref Range Status  08/04/2020 10.1 8.7 - 10.3 mg/dL Final          Failed - Valid encounter within last 6 months    Recent Outpatient Visits           8 months ago Right wrist pain   [facility] [place], Clearnce Sorrel, PA-C   1 year ago Osteoarthritis of knee, unspecified laterality, unspecified osteoarthritis type   [place], [place], PA-C   1 year ago Osteopenia of neck of right femur   [place], [place], [place]   2 years ago Essential hypertension   [place], [place], [place]   2 years ago Type 2 diabetes mellitus without complication, without long-term current use of insulin [facility])   [place], Clearnce Sorrel, [place]       Future Appointments             In 1 month [employer], [place] - Patient is not pregnant      Passed - Last BP in normal range    BP Readings from Last 1 Encounters:  11/13/20 124/73          Passed - Last Heart Rate in normal range    Pulse Readings from Last 1 Encounters:  11/13/20 (!) 57           lisinopril-hydrochlorothiazide (ZESTORETIC) 10-12.5 MG tablet [Pharmacy Med Name: Lisinopril-hydroCHLOROthiazide 10-12.5 MG Oral Tablet] 90 tablet 0    Sig: Take 1 tablet by mouth once daily     Cardiovascular:  ACEI + Diuretic Combos Failed - 08/06/2021  8:32 AM      Failed - Na in normal range and within 180 days    Sodium  Date Value Ref Range Status  08/04/2020 139 134 - 144 mmol/L Final          Failed - K in normal range and within 180 days    Potassium  Date Value Ref Range Status  08/04/2020 4.5 3.5 - 5.2 mmol/L Final          Failed - Cr in normal range and within 180 days    Creatinine, Ser  Date Value Ref Range Status  08/04/2020 0.76 0.57 - 1.00 mg/dL Final          Failed - Ca in normal range and within 180 days    Calcium  Date Value Ref Range Status  08/04/2020 10.1 8.7 - 10.3 mg/dL Final          Failed - Valid encounter within last 6 months    Recent Outpatient Visits           8 months ago Right wrist pain   [facility] [place], Clearnce Sorrel, [place]   1 year ago Osteoarthritis of knee, unspecified laterality, unspecified osteoarthritis type   [place], [place], PA-C   1 year ago Osteopenia of neck of right femur   [facility] [place], [place], [place]   2 years ago Essential hypertension   [place], [place], [place]   2 years ago Type 2 diabetes mellitus without complication, without long-term current use of insulin ([place])   [place], Clearnce Sorrel, [place]       Future Appointments             In 1 month [employer], [place] - Patient is  not pregnant      Passed - Last BP in normal range    BP Readings from Last 1 Encounters:  11/13/20 124/73           simvastatin (ZOCOR) 20 MG tablet [Pharmacy Med Name: Simvastatin 20 MG Oral Tablet] 90 tablet 0    Sig: TAKE 1 TABLET BY MOUTH AT BEDTIME     Cardiovascular:  Antilipid - Statins Failed - 08/06/2021  8:32 AM      Failed - Total Cholesterol in normal range and within 360 days    Cholesterol, Total  Date Value Ref Range Status  08/04/2020 174 100 - 199 mg/dL Final          Failed - LDL in normal range and within 360 days    LDL Chol Calc (NIH)  Date Value Ref Range Status  08/04/2020 87 0 - 99 mg/dL Final          Failed - HDL in normal range and within 360 days    HDL  Date Value Ref Range Status  08/04/2020 65 >39 mg/dL Final          Failed - Triglycerides in normal range and within 360 days    Triglycerides  Date Value Ref Range Status  08/04/2020 127 0 - 149 mg/dL Final          Passed - Patient is not pregnant      Passed - Valid encounter within last 12 months    Recent Outpatient Visits           8 months ago Right wrist pain   [place], Clearnce Sorrel, [place]   1 year ago Osteoarthritis of knee, unspecified laterality, unspecified osteoarthritis type   [place], Clearnce Sorrel, [place]   1 year ago Osteopenia of neck of right femur   [place], [place], [place]   2 years ago Essential hypertension  [facility] [place], [place], [place]   2 years ago Type 2 diabetes mellitus without complication, without long-term current use of insulin [facility])   [facility] [place], Clearnce Sorrel, [place]       Future Appointments             In 1 month [place], [facility]              amLODipine ([place]) 5 MG tablet [Pharmacy Med Name: amLODIPine Besylate 5 MG Oral Tablet] 90 tablet 0    Sig: Take 1 tablet by mouth once daily      Cardiovascular:  Calcium Channel Blockers Failed - 08/06/2021  8:32 AM      Failed - Valid encounter within last 6 months    Recent Outpatient Visits           8 months ago Right wrist pain   [facility] [place], Clearnce Sorrel, PA-C   1 year ago Osteoarthritis of knee, unspecified laterality, unspecified osteoarthritis type   [facility] [place], Clearnce Sorrel, PA-C   1 year ago Osteopenia of neck of right femur   [facility] [place], Clearnce Sorrel, [place]   2 years ago Essential hypertension   [place], [place], [place]   2 years ago Type 2 diabetes mellitus without complication, without long-term current use of insulin [facility])   [place], [place] M, [place]       Future Appointments             In 1 month [place], [facility]             Passed - Last BP in normal range    BP Readings from Last 1 Encounters:  11/13/20 124/73           EQ ALLERGY RELIEF 10 MG tablet [Pharmacy Med Name: EQ Allergy Relief 10 MG Oral Tablet] 90 tablet 0    Sig: TAKE 1 TABLET BY MOUTH ONCE DAILY AS NEEDED     Ear, Nose, and Throat:  Antihistamines Passed - 08/06/2021  8:32 AM      Passed - Valid encounter within last 12 months    Recent Outpatient Visits           8 months ago Right wrist pain   [facility] [place], Clearnce Sorrel, PA-C   1 year ago Osteoarthritis of knee, unspecified laterality, unspecified osteoarthritis type   [place], Clearnce Sorrel, PA-C   1 year ago Osteopenia of neck of right femur   [facility] [place], Clearnce Sorrel, [place]   2 years ago Essential hypertension   [place], [place], [place]   2 years ago Type 2 diabetes mellitus without complication, without long-term current use of insulin ([place])   [place], Clearnce Sorrel, [place]       Future Appointments              In 1 month [place], [facility]              metFORMIN (GLUCOPHAGE) 1000 MG tablet [Pharmacy Med Name: metFORMIN HCl 1000 MG Oral Tablet] 180 tablet 0    Sig: TAKE 1 TABLET BY MOUTH TWICE DAILY WITH A MEAL     Endocrinology:  Diabetes - Biguanides Failed - 08/06/2021  8:32 AM      Failed - Cr in normal range and within 360 days    Creatinine, Ser  Date Value Ref Range Status  08/04/2020 0.76 0.57 - 1.00 mg/dL Final          Failed - HBA1C is between 0 and 7.9 and within 180 days    Hgb A1c MFr Bld  Date Value Ref Range Status  08/04/2020 5.7 (H) 4.8 - 5.6 % Final    Comment:             Prediabetes: 5.7 - 6.4          Diabetes: >6.4          Glycemic control for adults with diabetes: <7.0           Failed - eGFR in normal range and within 360 days    GFR calc Af Amer  Date Value Ref Range Status  08/04/2020 95 >59 mL/min/1.73 Final    Comment:    **In accordance with recommendations from the NKF-ASN Task force,**   [facility] is in the process of updating its eGFR calculation to the   2021 CKD-EPI creatinine equation that estimates kidney function   without a race variable.    GFR calc non Af Amer  Date Value Ref Range Status  08/04/2020 82 >59 mL/min/1.73 Final          Failed - Valid encounter within last 6 months    Recent Outpatient Visits           8 months ago Right wrist pain   [facility] [place], Clearnce Sorrel, [place]   1 year ago Osteoarthritis of knee, unspecified laterality, unspecified osteoarthritis type   [place], [place]   1 year ago Osteopenia of neck of right femur   [facility] [place], Clearnce Sorrel, [place]   2 years ago Essential hypertension   [place], [place], [place]   2 years ago Type 2 diabetes mellitus without complication, without long-term current use of insulin [facility])   [place], Clearnce Sorrel, [place]        Future Appointments             In 39 month [employer], [place]

## 2021-08-06 NOTE — Telephone Encounter (Signed)
Medication Refill - Medication:  bisoprolol-hydrochlorothiazide (ZIAC) 10-6.25 MG tablet  loratadine (EQ LORATADINE) 10 MG tablet  lisinopril-hydrochlorothiazide (ZESTORETIC) 10-12.5 MG tablet  metFORMIN (GLUCOPHAGE) 1000 MG tablet   simvastatin (ZOCOR) 20 MG tablet  amLODipine (NORVASC) 5 MG tablet  Vitamin D, Ergocalciferol, (DRISDOL) 1.25 MG (50000 UNIT) CAPS capsule  Pts appt was cancelled by office at the last minute and they advised her that they would reschedule her to January and refill her medications/ pt meds were not refilled and she is out of medication / pt would like her refills today and was very upset about this cancellation / stating this is the third time her appt has been cancelled within 24hr of it / please advise / pt also mention a migraine medication that may start with an R / she didn't know the name   Has the patient contacted their pharmacy? Yes.   (Agent: If no, request that the patient contact the pharmacy for the refill. If patient does not wish to contact the pharmacy document the reason why and proceed with request.) (Agent: If yes, when and what did the pharmacy advise?) pharmacy sent a request   Preferred Pharmacy (with phone number or street name): [facility] ([facility] [address]  [place] [address]  Phone:  [phone]  Fax:  [phone] Has the patient been seen for an appointment in the last year OR does the patient have an upcoming appointment? Yes.    Agent: Please be advised that RX refills may take up to 3 business days. We ask that you follow-up with your pharmacy.

## 2021-08-08 ENCOUNTER — Ambulatory Visit (INDEPENDENT_AMBULATORY_CARE_PROVIDER_SITE_OTHER): Payer: Medicare PPO

## 2021-08-08 ENCOUNTER — Other Ambulatory Visit: Payer: Self-pay

## 2021-08-08 ENCOUNTER — Other Ambulatory Visit: Payer: Self-pay | Admitting: *Deleted

## 2021-08-08 VITALS — BP 134/82 | HR 51 | Temp 97.9°F | Ht 64.0 in | Wt 181.1 lb

## 2021-08-08 DIAGNOSIS — E7800 Pure hypercholesterolemia, unspecified: Secondary | ICD-10-CM

## 2021-08-08 DIAGNOSIS — M179 Osteoarthritis of knee, unspecified: Secondary | ICD-10-CM

## 2021-08-08 DIAGNOSIS — I10 Essential (primary) hypertension: Secondary | ICD-10-CM

## 2021-08-08 DIAGNOSIS — Z0000 Encounter for general adult medical examination without abnormal findings: Secondary | ICD-10-CM | POA: Diagnosis not present

## 2021-08-08 DIAGNOSIS — E119 Type 2 diabetes mellitus without complications: Secondary | ICD-10-CM

## 2021-08-08 DIAGNOSIS — C50111 Malignant neoplasm of central portion of right female breast: Secondary | ICD-10-CM

## 2021-08-08 MED ORDER — SIMVASTATIN 20 MG PO TABS
20.0000 mg | ORAL_TABLET | Freq: Every day | ORAL | 0 refills | Status: DC
Start: 2021-08-08 — End: 2021-09-26

## 2021-08-08 MED ORDER — METFORMIN HCL 1000 MG PO TABS: 1000.0000 mg | ORAL_TABLET | Freq: Two times a day (BID) | ORAL | 0 refills | Status: DC

## 2021-08-08 MED ORDER — LISINOPRIL-HYDROCHLOROTHIAZIDE 10-12.5 MG PO TABS: 1.0000 | ORAL_TABLET | Freq: Every day | ORAL | 0 refills | Status: DC

## 2021-08-08 MED ORDER — BACLOFEN 10 MG PO TABS: 10.0000 mg | ORAL_TABLET | Freq: Every day | ORAL | 0 refills | Status: DC

## 2021-08-08 MED ORDER — LETROZOLE 2.5 MG PO TABS: 2.5000 mg | ORAL_TABLET | Freq: Every day | ORAL | 0 refills | Status: DC

## 2021-08-08 MED ORDER — AMLODIPINE BESYLATE 5 MG PO TABS: 5.0000 mg | ORAL_TABLET | Freq: Every day | ORAL | 0 refills | Status: DC

## 2021-08-08 NOTE — Progress Notes (Signed)
Subjective:   Nichole Martinez is a 70 y.o. female who presents for Medicare Annual (Subsequent) preventive examination.  Review of Systems     Cardiac Risk Factors include: advanced age (>28men, >65 women)     Objective:    Today's Vitals   08/08/21 1309 08/08/21 1319  BP: 134/82   Pulse: (!) 51   Temp: 97.9 F (36.6 C)   TempSrc: Oral   SpO2: 98%   Weight: 181 lb 1.6 oz (82.1 kg)   Height: 5\' 4"  (1.626 m)   PainSc: 0-No pain 0-No pain   Body mass index is 31.09 kg/m.  Advanced Directives 08/08/2021 04/05/2021 12/30/2019 12/11/2019 12/01/2019 11/17/2019 11/10/2019  Does Patient Have a Medical Advance Directive? Yes No Yes Yes Yes Yes Yes  Type of Paramedic of [place];Living will Out of facility DNR (pink MOST or yellow form) - [place];Living will [place];Living will [place];Living will [place];Living will  Does patient want to make changes to medical advance directive? No - Patient declined - - - No - Patient declined No - Patient declined No - Patient declined  Copy of [place] in Chart? No - copy requested - No - copy requested No - copy requested No - copy requested No - copy requested -  Would patient like information on creating a medical advance directive? - - - - - - -    Current Medications (verified) Outpatient Encounter Medications as of 08/08/2021  Medication Sig   amLODipine (NORVASC) 5 MG tablet Take 1 tablet (5 mg total) by mouth daily.   baclofen (LIORESAL) 10 MG tablet Take 1 tablet (10 mg total) by mouth daily.   bisoprolol-hydrochlorothiazide (ZIAC) 10-6.25 MG tablet Take 1 tablet by mouth 2 (two) times daily.   Cholecalciferol (VITAMIN D3) 5000 units CAPS Take by mouth.   letrozole (FEMARA) 2.5 MG tablet Take 1 tablet (2.5 mg total) by mouth daily.   lisinopril-hydrochlorothiazide (ZESTORETIC) 10-12.5 MG tablet Take 1 tablet by  mouth daily.   loratadine (EQ LORATADINE) 10 MG tablet Take 1 tablet (10 mg total) by mouth daily as needed.   metFORMIN (GLUCOPHAGE) 1000 MG tablet Take 1 tablet (1,000 mg total) by mouth 2 (two) times daily with a meal.   MULTIPLE VITAMINS PO MULTIVITAMINS (Oral Tablet)  1 Every Day for 0 days  Quantity: 0.00;  Refills: 0   Ordered :17-Apr-2011  [name] ;  Started 24-February-2009 Active   OMEGA 3-6-9 FATTY ACIDS PO Reported on 10/04/2015   simvastatin (ZOCOR) 20 MG tablet Take 1 tablet (20 mg total) by mouth at bedtime.   vitamin E 1000 UNIT capsule Take 1,000 Units by mouth daily.   [DISCONTINUED] Vitamin D, Ergocalciferol, (DRISDOL) 1.25 MG (50000 UNIT) CAPS capsule Take 1 capsule (50,000 Units total) by mouth every 7 (seven) days.   Facility-Administered Encounter Medications as of 08/08/2021  Medication   heparin lock flush 100 unit/mL   sodium chloride flush (NS) 0.9 % injection 10 mL    Allergies (verified) Morphine   History: Past Medical History:  Diagnosis Date   Allergy    Breast cancer, right ([place]) 2020   Currently taking hormonal chemo tx's.    Depression    Diabetes mellitus without complication ([place]) 6761   Dyspnea    due to weight   Hyperlipidemia    Hypertension    Osteoporosis    Trigeminal neuralgia    Past Surgical History:  Procedure Laterality Date   ABDOMINAL HYSTERECTOMY  1978   BREAST BIOPSY Right 01/06/2019   [place] bx x 2.  Ribbon marker for mass and hydro for Lymph node. Path pending   BREAST CYST EXCISION Right 1986   BREAST EXCISIONAL BIOPSY     IR IMAGING GUIDED PORT INSERTION  05/12/2019   LAMINECTOMY  1995   L1, L2, L3   MASTECTOMY WITH AXILLARY LYMPH NODE DISSECTION Bilateral 11/17/2019   Procedure: BILATERAL MASTECTOMIES WITH RIGHT TARGETED SEED NODE DISSECTION;  Surgeon: Jovita Kussmaul, MD;  Location: [facility];  Service: General;  Laterality: Bilateral;   PORT-A-CATH REMOVAL Left 02/25/2020   Procedure: REMOVAL PORT-A-CATH;   Surgeon: Jovita Kussmaul, MD;  Location: [facility];  Service: General;  Laterality: Left;   REPLACEMENT TOTAL KNEE Left 04/28/2020   REPLACEMENT TOTAL KNEE Right 06/21/2020   SENTINEL NODE BIOPSY Right 11/17/2019   Procedure: RIGHT SENTINEL NODE BIOPSY;  Surgeon: Jovita Kussmaul, MD;  Location: [facility];  Service: General;  Laterality: Right;   SPINE SURGERY     Family History  Adopted: Yes  Family history unknown: Yes   Social History   Socioeconomic History   Marital status: Divorced    Spouse name: Not on file   Number of children: 1   Years of education: Not on file   Highest education level: Not on file  Occupational History   Occupation: Therapist, sports    Employer: [employer]  Tobacco Use   Smoking status: Former    Types: Cigarettes    Quit date: 03/16/2000    Years since quitting: 21.4   Smokeless tobacco: Never  Vaping Use   Vaping Use: Never used  Substance and Sexual Activity   Alcohol use: No   Drug use: No   Sexual activity: Not on file  Other Topics Concern   Not on file  Social History Narrative   Not on file   Social Determinants of Health   Financial Resource Strain: Low Risk    Difficulty of Paying Living Expenses: Not hard at all  Food Insecurity: No Food Insecurity   Worried About Charity fundraiser in the Last Year: Never true   Arboriculturist in the Last Year: Never true  Transportation Needs: No Transportation Needs   Lack of Transportation (Medical): No   Lack of Transportation (Non-Medical): No  Physical Activity: Insufficiently Active   Days of Exercise per Week: 7 days   Minutes of Exercise per Session: 20 min  Stress: No Stress Concern Present   Feeling of Stress : Only a little  Social Connections: Socially Isolated   Frequency of Communication with Friends and Family: More than three times a week   Frequency of Social Gatherings with Friends and Family: More than three times a week   Attends Religious  Services: Never   Marine scientist or Organizations: No   Attends Music therapist: Never   Marital Status: Divorced    Tobacco Counseling Counseling given: Not Answered   Clinical Intake:  Pre-visit preparation completed: Yes  Pain : No/denies pain Pain Score: 0-No pain     Nutritional Status: BMI > 30  Obese Nutritional Risks: None Diabetes: Yes CBG done?: No Did pt. bring in CBG monitor from home?: No  How often do you need to have someone help you when you read instructions, pamphlets, or other written materials from your doctor or pharmacy?: 1 - Never  Diabetic?yes Nutrition Risk Assessment:  Has the patient had any N/V/D within the last 2 months?  No  Does the patient have any non-healing wounds?  No  Has the patient had any unintentional weight loss or weight gain?  Yes   Diabetes:  Is the patient diabetic?  Yes  If diabetic, was a CBG obtained today?  No  Did the patient bring in their glucometer from home?  No  How often do you monitor your CBG's? Yes, weekly.   Financial Strains and Diabetes Management:  Are you having any financial strains with the device, your supplies or your medication? No .  Does the patient want to be seen by Chronic Care Management for management of their diabetes?  No  Would the patient like to be referred to a Nutritionist or for Diabetic Management?  No   Diabetic Exams:  Diabetic Eye Exam: Completed fist week of November this year.   Diabetic Foot Exam: Completed 03/28/20. Pt has been advised about the importance in completing this exam.Interpreter Needed?: No  Information entered by :: Kirke Shaggy, LPN   Activities of Daily Living In your present state of health, do you have any difficulty performing the following activities: 08/08/2021 08/07/2021  Hearing? N N  Vision? N N  Difficulty concentrating or making decisions? N N  Walking or climbing stairs? N N  Dressing or bathing? N N  Doing errands,  shopping? N N  Preparing Food and eating ? N N  Using the Toilet? N N  In the past six months, have you accidently leaked urine? N Y  Do you have problems with loss of bowel control? N N  Managing your Medications? N N  Managing your Finances? N N  Housekeeping or managing your Housekeeping? N N  Some recent data might be hidden    Patient Care Team: [name], PA-C as PCP - General (Physician Assistant) Rico Junker, RN as Registered Nurse Marlyn Corporal Clearnce Sorrel, PA-C as Physician Assistant (Family Medicine) Lloyd Huger, MD as Consulting Physician (Oncology) Jovita Kussmaul, MD as Consulting Physician (General Surgery)  Indicate any recent Medical Services you may have received from other than [facility] providers in the past year (date may be approximate).     Assessment:   This is a routine wellness examination for [place].  Hearing/Vision screen Hearing Screening - Comments:: Hearing is good, no hearing aids, no changes Vision Screening - Comments:: Wears glasses, sees [facility] in [place] issues and exercise activities discussed: Current Exercise Habits: Home exercise routine, Type of exercise: walking, Time (Minutes): 20, Frequency (Times/Week): 7, Weekly Exercise (Minutes/Week): 140, Intensity: Mild   Goals Addressed             This Visit's Progress    Weight (lb) < 200 lb (90.7 kg)   181 lb 1.6 oz (82.1 kg)    Like to lose 10lb       Depression Screen PHQ 2/9 Scores 08/08/2021 08/04/2020 08/04/2019 05/07/2018 05/06/2017  PHQ - 2 Score 0 0 0 0 0  PHQ- 9 Score - 2 - 0 0    Fall Risk Fall Risk  08/08/2021 08/07/2021 08/04/2020 04/20/2019 01/12/2019  Falls in the past year? 0 1 0 0 0  Number falls in past yr: 0 1 0 - 0  Injury with Fall? 0 0 0 - 0  Risk for fall due to : No Fall Risks - No Fall Risks - -  Follow up Falls prevention discussed - Falls  evaluation completed - -    FALL RISK PREVENTION PERTAINING TO THE HOME:  Any stairs in  or around the home? No  If so, are there any without handrails? No  Home free of loose throw rugs in walkways, pet beds, electrical cords, etc? Yes  Adequate lighting in your home to reduce risk of falls? Yes   ASSISTIVE DEVICES UTILIZED TO PREVENT FALLS:  Life alert? No  Use of a cane, walker or w/c? No  Grab bars in the bathroom? Yes  Shower chair or bench in shower? No  Elevated toilet seat or a handicapped toilet? No   TIMED UP AND GO:  Was the test performed? Yes .  Length of time to ambulate 10 feet: 3 sec.   Gait steady and fast without use of assistive device  Cognitive Function: Normal cognitive status assessed by direct observation by this Nurse Health Advisor. No abnormalities found.          Immunizations Immunization History  Administered Date(s) Administered   Influenza,inj,Quad PF,6+ Mos 06/17/2015   Influenza-Unspecified 06/16/2020   Moderna Sars-Covid-2 Vaccination 12/15/2019, 01/12/2020    TDAP status: Up to date  Flu Vaccine status: Up to date  Pneumococcal vaccine status: Up to date  Covid-19 vaccine status: Completed vaccines  Qualifies for Shingles Vaccine? Yes   Zostavax completed No   Shingrix Completed?: Yes  Screening Tests Health Maintenance  Topic Date Due   Pneumonia Vaccine 63+ Years old (1 - PCV) Never done   OPHTHALMOLOGY EXAM  03/07/2017   FOOT EXAM  03/28/2020   COVID-19 Vaccine (4 - Booster for Moderna series) 10/10/2020   MAMMOGRAM  12/29/2020   HEMOGLOBIN A1C  02/01/2021   INFLUENZA VACCINE  04/16/2021   Fecal DNA (Cologuard)  10/27/2023   TETANUS/TDAP  04/16/2030   DEXA SCAN  Completed   Hepatitis C Screening  Completed   Zoster Vaccines- Shingrix  Completed   HPV VACCINES  Aged Out    Health Maintenance  Health Maintenance Due  Topic Date Due   Pneumonia Vaccine 35+ Years old (1 - PCV) Never done   OPHTHALMOLOGY EXAM  03/07/2017   FOOT EXAM  03/28/2020   COVID-19 Vaccine (4 - Booster for Moderna series)  10/10/2020   MAMMOGRAM  12/29/2020   HEMOGLOBIN A1C  02/01/2021   INFLUENZA VACCINE  04/16/2021    Colorectal cancer screening: Type of screening: Cologuard. Completed 12/27/20. Repeat every 3 years  Mammogram status: Completed 12/30/18. Repeat every year  Bone Density status: Completed 12/14/19. Results reflect: Bone density results: OSTEOPENIA. Repeat every 5 years.  Lung Cancer Screening: (Low Dose CT Chest recommended if Age 72-80 years, 30 pack-year currently smoking OR have quit w/in 15years.) does not qualify.    Additional Screening:  Hepatitis C Screening: does qualify; Completed 05/06/17  Vision Screening: Recommended annual ophthalmology exams for early detection of glaucoma and other disorders of the eye. Is the patient up to date with their annual eye exam?  Yes  Who is the provider or what is the name of the office in which the patient attends annual eye exams? [facility] in [place]: Recommended annual dental exams for proper oral hygiene  Community Resource Referral / Chronic Care Management: CRR required this visit?  No   CCM required this visit?  No      Plan:     I have personally reviewed and noted the following in the patient's chart:   Medical and social history Use of alcohol, tobacco  or illicit drugs  Current medications and supplements including opioid prescriptions.  Functional ability and status Nutritional status Physical activity Advanced directives List of other physicians Hospitalizations, surgeries, and ER visits in previous 12 months Vitals Screenings to include cognitive, depression, and falls Referrals and appointments  In addition, I have reviewed and discussed with patient certain preventive protocols, quality metrics, and best practice recommendations. A written personalized care plan for preventive services as well as general preventive health recommendations were provided to patient.     Dionisio David, LPN   [id number]   Nurse Notes: none

## 2021-08-08 NOTE — Patient Instructions (Signed)
Nichole Martinez , Thank you for taking time to come for your Medicare Wellness Visit. I appreciate your ongoing commitment to your health goals. Please review the following plan we discussed and let me know if I can assist you in the future.   Screening recommendations/referrals: Colonoscopy: 12/27/20 Cologuard Mammogram: 12/30/18 Bone Density: 12/14/19 Recommended yearly ophthalmology/optometry visit for glaucoma screening and checkup Recommended yearly dental visit for hygiene and checkup  Vaccinations: Influenza vaccine: first week in November at the pharmacy Pneumococcal vaccine: first week in November at the pharmacy Tdap vaccine: 04/16/20 Shingles vaccine: first week in November at the pharmacy    Covid-19:12/15/19, 01/12/20, 08/15/20  Advanced directives: yes, requested copy  Conditions/risks identified:   Next appointment: Follow up in one year for your annual wellness visit    Preventive Care 65 Years and Older, Female Preventive care refers to lifestyle choices and visits with your health care provider that can promote health and wellness. What does preventive care include? A yearly physical exam. This is also called an annual well check. Dental exams once or twice a year. Routine eye exams. Ask your health care provider how often you should have your eyes checked. Personal lifestyle choices, including: Daily care of your teeth and gums. Regular physical activity. Eating a healthy diet. Avoiding tobacco and drug use. Limiting alcohol use. Practicing safe sex. Taking low-dose aspirin every day. Taking vitamin and mineral supplements as recommended by your health care provider. What happens during an annual well check? The services and screenings done by your health care provider during your annual well check will depend on your age, overall health, lifestyle risk factors, and family history of disease. Counseling  Your health care provider may ask you questions about  your: Alcohol use. Tobacco use. Drug use. Emotional well-being. Home and relationship well-being. Sexual activity. Eating habits. History of falls. Memory and ability to understand (cognition). Work and work Statistician. Reproductive health. Screening  You may have the following tests or measurements: Height, weight, and BMI. Blood pressure. Lipid and cholesterol levels. These may be checked every 5 years, or more frequently if you are over 34 years old. Skin check. Lung cancer screening. You may have this screening every year starting at age 53 if you have a 30-pack-year history of smoking and currently smoke or have quit within the past 15 years. Fecal occult blood test (FOBT) of the stool. You may have this test every year starting at age 6. Flexible sigmoidoscopy or colonoscopy. You may have a sigmoidoscopy every 5 years or a colonoscopy every 10 years starting at age 70. Hepatitis C blood test. Hepatitis B blood test. Sexually transmitted disease (STD) testing. Diabetes screening. This is done by checking your blood sugar (glucose) after you have not eaten for a while (fasting). You may have this done every 1-3 years. Bone density scan. This is done to screen for osteoporosis. You may have this done starting at age 56. Mammogram. This may be done every 1-2 years. Talk to your health care provider about how often you should have regular mammograms. Talk with your health care provider about your test results, treatment options, and if necessary, the need for more tests. Vaccines  Your health care provider may recommend certain vaccines, such as: Influenza vaccine. This is recommended every year. Tetanus, diphtheria, and acellular pertussis (Tdap, Td) vaccine. You may need a Td booster every 10 years. Zoster vaccine. You may need this after age 2. Pneumococcal 13-valent conjugate (PCV13) vaccine. One dose is recommended after age  65. Pneumococcal polysaccharide (PPSV23) vaccine.  One dose is recommended after age 46. Talk to your health care provider about which screenings and vaccines you need and how often you need them. This information is not intended to replace advice given to you by your health care provider. Make sure you discuss any questions you have with your health care provider. Document Released: 09/29/2015 Document Revised: 05/22/2016 Document Reviewed: 07/04/2015 Elsevier Interactive Patient Education  2017 [place] Prevention in the Home Falls can cause injuries. They can happen to people of all ages. There are many things you can do to make your home safe and to help prevent falls. What can I do on the outside of my home? Regularly fix the edges of walkways and driveways and fix any cracks. Remove anything that might make you trip as you walk through a door, such as a raised step or threshold. Trim any bushes or trees on the path to your home. Use bright outdoor lighting. Clear any walking paths of anything that might make someone trip, such as rocks or tools. Regularly check to see if handrails are loose or broken. Make sure that both sides of any steps have handrails. Any raised decks and porches should have guardrails on the edges. Have any leaves, snow, or ice cleared regularly. Use sand or salt on walking paths during winter. Clean up any spills in your garage right away. This includes oil or grease spills. What can I do in the bathroom? Use night lights. Install grab bars by the toilet and in the tub and shower. Do not use towel bars as grab bars. Use non-skid mats or decals in the tub or shower. If you need to sit down in the shower, use a plastic, non-slip stool. Keep the floor dry. Clean up any water that spills on the floor as soon as it happens. Remove soap buildup in the tub or shower regularly. Attach bath mats securely with double-sided non-slip rug tape. Do not have throw rugs and other things on the floor that can make  you trip. What can I do in the bedroom? Use night lights. Make sure that you have a light by your bed that is easy to reach. Do not use any sheets or blankets that are too big for your bed. They should not hang down onto the floor. Have a firm chair that has side arms. You can use this for support while you get dressed. Do not have throw rugs and other things on the floor that can make you trip. What can I do in the kitchen? Clean up any spills right away. Avoid walking on wet floors. Keep items that you use a lot in easy-to-reach places. If you need to reach something above you, use a strong step stool that has a grab bar. Keep electrical cords out of the way. Do not use floor polish or wax that makes floors slippery. If you must use wax, use non-skid floor wax. Do not have throw rugs and other things on the floor that can make you trip. What can I do with my stairs? Do not leave any items on the stairs. Make sure that there are handrails on both sides of the stairs and use them. Fix handrails that are broken or loose. Make sure that handrails are as long as the stairways. Check any carpeting to make sure that it is firmly attached to the stairs. Fix any carpet that is loose or worn. Avoid having throw rugs at  the top or bottom of the stairs. If you do have throw rugs, attach them to the floor with carpet tape. Make sure that you have a light switch at the top of the stairs and the bottom of the stairs. If you do not have them, ask someone to add them for you. What else can I do to help prevent falls? Wear shoes that: Do not have high heels. Have rubber bottoms. Are comfortable and fit you well. Are closed at the toe. Do not wear sandals. If you use a stepladder: Make sure that it is fully opened. Do not climb a closed stepladder. Make sure that both sides of the stepladder are locked into place. Ask someone to hold it for you, if possible. Clearly mark and make sure that you can  see: Any grab bars or handrails. First and last steps. Where the edge of each step is. Use tools that help you move around (mobility aids) if they are needed. These include: Canes. Walkers. Scooters. Crutches. Turn on the lights when you go into a dark area. Replace any light bulbs as soon as they burn out. Set up your furniture so you have a clear path. Avoid moving your furniture around. If any of your floors are uneven, fix them. If there are any pets around you, be aware of where they are. Review your medicines with your doctor. Some medicines can make you feel dizzy. This can increase your chance of falling. Ask your doctor what other things that you can do to help prevent falls. This information is not intended to replace advice given to you by your health care provider. Make sure you discuss any questions you have with your health care provider. Document Released: 06/29/2009 Document Revised: 02/08/2016 Document Reviewed: 10/07/2014 Elsevier Interactive Patient Education  2017 [employer].

## 2021-08-15 ENCOUNTER — Telehealth: Payer: Self-pay

## 2021-08-15 NOTE — Telephone Encounter (Signed)
Copied from [place] [id number]. 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

## 2021-08-15 NOTE — Telephone Encounter (Signed)
Immunization record placed up front ready for pick up.

## 2021-09-05 ENCOUNTER — Telehealth: Payer: Self-pay | Admitting: Oncology

## 2021-09-05 NOTE — Telephone Encounter (Signed)
Pt called to set up an appt with MD. Call back at [phone]

## 2021-09-18 NOTE — Progress Notes (Signed)
Annual Wellness Visit     Patient: Nichole Martinez, Female    DOB: July 15, 1951, 71 y.o.   MRN: [id number] Visit Date: 09/19/2021  Today's Provider: [name], PA-C   Chief Complaint  Patient presents with   Annual Exam   I,Nichole Martinez,acting as a scribe for [name], PA-C.,have documented all relevant documentation on the behalf of [name], PA-C,as directed by  [name], PA-C while in the presence of [name], PA-C.  Subjective    Nichole Martinez is a 71 y.o. female who presents today for her complete physical exam She reports consuming a general diet. The patient has a physically strenuous job, but has no regular exercise apart from work.  She generally feels well. She reports sleeping well. She does have additional problems to discuss today.   She is s/p hysterectomy in her 41s, and has in the past several weeks felt some vaginal / pelvic floor instability, believes she has a rectocele. Feels a bulge when she bears down to have a bowel movement.  She has taken steps to be less constipated, taking laxatives and stool softeners as needed.  She states she did not take her blood pressure medication this morning as she has not eaten yet, home blood pressures are 120s over 70s.     Medications: Outpatient Medications Prior to Visit  Medication Sig   amLODipine (NORVASC) 5 MG tablet Take 1 tablet (5 mg total) by mouth daily.   baclofen (LIORESAL) 10 MG tablet Take 1 tablet (10 mg total) by mouth daily.   bisoprolol-hydrochlorothiazide (ZIAC) 10-6.25 MG tablet Take 1 tablet by mouth 2 (two) times daily.   Cholecalciferol (VITAMIN D3) 5000 units CAPS Take by mouth.   letrozole (FEMARA) 2.5 MG tablet Take 1 tablet (2.5 mg total) by mouth daily.   lisinopril-hydrochlorothiazide (ZESTORETIC) 10-12.5 MG tablet Take 1 tablet by mouth daily.   loratadine (EQ LORATADINE) 10 MG tablet Take 1 tablet (10 mg total) by mouth daily as needed.   metFORMIN  (GLUCOPHAGE) 1000 MG tablet Take 1 tablet (1,000 mg total) by mouth 2 (two) times daily with a meal.   MULTIPLE VITAMINS PO MULTIVITAMINS (Oral Tablet)  1 Every Day for 0 days  Quantity: 0.00;  Refills: 0   Ordered :17-Apr-2011  [name] ;  Started 24-February-2009 Active   OMEGA 3-6-9 FATTY ACIDS PO Reported on 10/04/2015   simvastatin (ZOCOR) 20 MG tablet Take 1 tablet (20 mg total) by mouth at bedtime.   vitamin E 1000 UNIT capsule Take 1,000 Units by mouth daily.   Facility-Administered Medications Prior to Visit  Medication Dose Route Frequency Provider   heparin lock flush 100 unit/mL  500 Units Intravenous Once Lloyd Huger, MD   sodium chloride flush (NS) 0.9 % injection 10 mL  10 mL Intravenous Once Lloyd Huger, MD    Allergies  Allergen Reactions   Morphine Anaphylaxis    Anaphallaxis.    Patient Care Team: [name], PA-C as PCP - General (Physician Assistant) Rico Junker, RN as Registered Nurse Marlyn Corporal Clearnce Sorrel, PA-C as Physician Assistant (Family Medicine) Lloyd Huger, MD as Consulting Physician (Oncology) Jovita Kussmaul, MD as Consulting Physician (General Surgery)  Review of Systems  Constitutional:  Positive for unexpected weight change.  HENT:  Positive for hearing loss and rhinorrhea.   Eyes: Negative.   Respiratory: Negative.    Cardiovascular:  Positive for leg swelling.  Gastrointestinal: Negative.   Endocrine: Positive for cold  intolerance.  Genitourinary: Negative.        Vaginal bulge  Musculoskeletal: Negative.   Skin: Negative.   Allergic/Immunologic: Negative.   Neurological: Negative.   Hematological: Negative.   Psychiatric/Behavioral: Negative.     Last CBC Lab Results  Component Value Date   WBC 6.9 08/04/2020   HGB 12.5 08/04/2020   HCT 39.5 08/04/2020   MCV 79 08/04/2020   MCH 25.1 (L) 08/04/2020   RDW 14.5 08/04/2020   PLT 286 31/51/7616   Last metabolic panel Lab Results  Component Value Date    GLUCOSE 94 08/04/2020   NA 139 08/04/2020   K 4.5 08/04/2020   CL 99 08/04/2020   CO2 25 08/04/2020   BUN 20 08/04/2020   CREATININE 0.76 08/04/2020   GFRNONAA 82 08/04/2020   CALCIUM 10.1 08/04/2020   PROT 7.0 08/04/2020   ALBUMIN 4.7 08/04/2020   LABGLOB 2.3 08/04/2020   AGRATIO 2.0 08/04/2020   BILITOT 0.4 08/04/2020   ALKPHOS 90 08/04/2020   AST 17 08/04/2020   ALT 12 08/04/2020   ANIONGAP 8 02/22/2020   Last lipids Lab Results  Component Value Date   CHOL 174 08/04/2020   HDL 65 08/04/2020   LDLCALC 87 08/04/2020   TRIG 127 08/04/2020   CHOLHDL 3.9 08/05/2019   Last hemoglobin A1c Lab Results  Component Value Date   HGBA1C 5.7 (H) 08/04/2020   Last thyroid functions Lab Results  Component Value Date   TSH 0.235 (L) 08/04/2020   Last vitamin D Lab Results  Component Value Date   VD25OH 75.7 08/04/2020        Objective    Vitals: BP 122/70 Comment: at home   Pulse 64    Temp 98.1 F (36.7 C) (Oral)    Resp 16    Wt 182 lb 3.2 oz (82.6 kg)    SpO2 99%    BMI 31.27 kg/m  BP Readings from Last 3 Encounters:  09/19/21 122/70  08/08/21 134/82  11/13/20 124/73   Wt Readings from Last 3 Encounters:  09/19/21 182 lb 3.2 oz (82.6 kg)  08/08/21 181 lb 1.6 oz (82.1 kg)  11/13/20 185 lb (83.9 kg)      Physical Exam Constitutional:      General: She is awake.     Appearance: She is well-developed.  HENT:     Head: Normocephalic.     Right Ear: Tympanic membrane normal.     Left Ear: Tympanic membrane normal.     Nose: Nose normal. No congestion or rhinorrhea.     Mouth/Throat:     Mouth: Mucous membranes are moist.     Pharynx: No oropharyngeal exudate.  Eyes:     Conjunctiva/sclera: Conjunctivae normal.     Pupils: Pupils are equal, round, and reactive to light.  Neck:     Thyroid: No thyroid mass or thyromegaly.  Cardiovascular:     Rate and Rhythm: Normal rate and regular rhythm.     Pulses:          Dorsalis pedis pulses are 3+ on the  right side and 3+ on the left side.     Heart sounds: Normal heart sounds.  Pulmonary:     Effort: Pulmonary effort is normal.     Breath sounds: Normal breath sounds.  Abdominal:     Palpations: Abdomen is soft.     Tenderness: There is no abdominal tenderness.  Musculoskeletal:     Right lower leg: No swelling.     Left  lower leg: No swelling.  Feet:     Right foot:     Protective Sensation: 3 sites tested.  3 sites sensed.     Skin integrity: Skin integrity normal.     Toenail Condition: Right toenails are abnormally thick.     Left foot:     Protective Sensation: 3 sites tested.  3 sites sensed.     Skin integrity: Skin integrity normal.     Toenail Condition: Left toenails are abnormally thick.  Lymphadenopathy:     Cervical: No cervical adenopathy.  Skin:    General: Skin is warm.  Neurological:     Mental Status: She is alert and oriented to person, place, and time.  Psychiatric:        Attention and Perception: Attention normal.        Mood and Affect: Mood normal.        Speech: Speech normal.        Behavior: Behavior is cooperative.    Most recent functional status assessment: In your present state of health, do you have any difficulty performing the following activities: 08/08/2021  Hearing? N  Vision? N  Difficulty concentrating or making decisions? N  Walking or climbing stairs? N  Dressing or bathing? N  Doing errands, shopping? N  Preparing Food and eating ? N  Using the Toilet? N  In the past six months, have you accidently leaked urine? N  Do you have problems with loss of bowel control? N  Managing your Medications? N  Managing your Finances? N  Housekeeping or managing your Housekeeping? N  Some recent data might be hidden   Most recent fall risk assessment: Fall Risk  08/08/2021  Falls in the past year? 0  Number falls in past yr: 0  Injury with Fall? 0  Risk for fall due to : No Fall Risks  Follow up Falls prevention discussed    Most  recent depression screenings: PHQ 2/9 Scores 08/08/2021 08/04/2020  PHQ - 2 Score 0 0  PHQ- 9 Score - 2   Most recent cognitive screening: 6CIT Screen 09/19/2021  What Year? 0 points  What month? 0 points  What time? 0 points  Count back from 20 0 points  Months in reverse 0 points  Repeat phrase 2 points  Total Score 2   Most recent Audit-C alcohol use screening Alcohol Use Disorder Test (AUDIT) 08/08/2021  1. How often do you have a drink containing alcohol? 0  2. How many drinks containing alcohol do you have on a typical day when you are drinking? 0  3. How often do you have six or more drinks on one occasion? 0  AUDIT-C Score 0  Alcohol Brief Interventions/Follow-up -   A score of 3 or more in women, and 4 or more in men indicates increased risk for alcohol abuse, EXCEPT if all of the points are from question 1   No results found for any visits on 09/19/21.  Assessment & Plan     Annual wellness visit done today including the all of the following: Reviewed patient's Family Medical History Reviewed and updated list of patient's medical providers Assessment of cognitive impairment was done Assessed patient's functional ability Established a written schedule for health screening [place] Completed and Reviewed  Exercise Activities and Dietary recommendations  Goals      Weight (lb) < 200 lb (90.7 kg)     Like to lose 10lb  Immunization History  Administered Date(s) Administered   Fluad Quad(high Dose 65+) 06/04/2021   Influenza,inj,Quad PF,6+ Mos 06/17/2015   Influenza-Unspecified 06/17/2015, 06/16/2020   MODERNA COVID-19 SARS-COV-2 PEDS BIVALENT BOOSTER 6Y-11Y 02/15/2021, 06/04/2021   Moderna Sars-Covid-2 Vaccination 12/15/2019, 01/12/2020, 08/15/2020   PNEUMOCOCCAL CONJUGATE-20 06/04/2021   Pneumococcal-Unspecified 08/16/2020   Tdap 04/16/2020   Zoster Recombinat (Shingrix) 08/16/2020, 10/17/2020, 12/12/2020    Health  Maintenance  Topic Date Due   OPHTHALMOLOGY EXAM  03/07/2017   FOOT EXAM  03/28/2020   HEMOGLOBIN A1C  02/01/2021   Fecal DNA (Cologuard)  10/27/2023   TETANUS/TDAP  04/16/2030   Pneumonia Vaccine 92+ Years old  Completed   INFLUENZA VACCINE  Completed   DEXA SCAN  Completed   COVID-19 Vaccine  Completed   Hepatitis C Screening  Completed   Zoster Vaccines- Shingrix  Completed   HPV VACCINES  Aged Out   MAMMOGRAM  Discontinued     Discussed health benefits of physical activity, and encouraged her to engage in regular exercise appropriate for her age and condition.    Problem List Items Addressed This Visit       Cardiovascular and Mediastinum   BP (high blood pressure)    While well controlled with multiple medications, no changes today.      Relevant Orders   Comprehensive metabolic panel     Endocrine   Diabetes mellitus, type 2 ([place])    Well-controlled on medication, diet and exercise.      Relevant Orders   Hemoglobin A1c     Genitourinary   Vaginal prolapse    Likely rectocele due to patient's description.  Refer to GYN surgery      Relevant Orders   Ambulatory referral to Gynecology     Other   Hyperlipemia   Relevant Orders   Lipid panel   Abnormal thyroid blood test    Historically, unmedicated, will recheck      Relevant Orders   TSH + free T4   Other Visit Diagnoses     Annual physical exam    -  Primary   Relevant Orders   Lipid panel   Comprehensive metabolic panel     '   Return in about 6 months (around 03/19/2022) for hypertension.     I, [name], PA-C have reviewed all documentation for this visit. The documentation on  09/19/2021 for the exam, diagnosis, procedures, and orders are all accurate and //complete.    [name], PA-C  [facility] [phone] (phone) [phone] (fax)  [place]

## 2021-09-19 ENCOUNTER — Ambulatory Visit (INDEPENDENT_AMBULATORY_CARE_PROVIDER_SITE_OTHER): Payer: Medicare PPO | Admitting: Physician Assistant

## 2021-09-19 ENCOUNTER — Other Ambulatory Visit: Payer: Self-pay

## 2021-09-19 ENCOUNTER — Ambulatory Visit: Payer: Medicare PPO

## 2021-09-19 ENCOUNTER — Encounter: Payer: Self-pay | Admitting: Physician Assistant

## 2021-09-19 VITALS — BP 122/70 | HR 64 | Temp 98.1°F | Resp 16 | Wt 182.2 lb

## 2021-09-19 DIAGNOSIS — N8110 Cystocele, unspecified: Secondary | ICD-10-CM | POA: Diagnosis not present

## 2021-09-19 DIAGNOSIS — Z0000 Encounter for general adult medical examination without abnormal findings: Secondary | ICD-10-CM | POA: Diagnosis not present

## 2021-09-19 DIAGNOSIS — R7989 Other specified abnormal findings of blood chemistry: Secondary | ICD-10-CM | POA: Diagnosis not present

## 2021-09-19 DIAGNOSIS — I10 Essential (primary) hypertension: Secondary | ICD-10-CM | POA: Diagnosis not present

## 2021-09-19 DIAGNOSIS — E7800 Pure hypercholesterolemia, unspecified: Secondary | ICD-10-CM

## 2021-09-19 DIAGNOSIS — E119 Type 2 diabetes mellitus without complications: Secondary | ICD-10-CM | POA: Diagnosis not present

## 2021-09-19 NOTE — Assessment & Plan Note (Signed)
Likely rectocele due to patient's description.  Refer to GYN surgery

## 2021-09-19 NOTE — Assessment & Plan Note (Signed)
While well controlled with multiple medications, no changes today.

## 2021-09-19 NOTE — Assessment & Plan Note (Signed)
Well-controlled on medication, diet and exercise.

## 2021-09-19 NOTE — Assessment & Plan Note (Signed)
Historically, unmedicated, will recheck

## 2021-09-20 DIAGNOSIS — Z0000 Encounter for general adult medical examination without abnormal findings: Secondary | ICD-10-CM | POA: Diagnosis not present

## 2021-09-20 DIAGNOSIS — I10 Essential (primary) hypertension: Secondary | ICD-10-CM | POA: Diagnosis not present

## 2021-09-20 DIAGNOSIS — R7989 Other specified abnormal findings of blood chemistry: Secondary | ICD-10-CM | POA: Diagnosis not present

## 2021-09-20 DIAGNOSIS — E7800 Pure hypercholesterolemia, unspecified: Secondary | ICD-10-CM | POA: Diagnosis not present

## 2021-09-20 DIAGNOSIS — E119 Type 2 diabetes mellitus without complications: Secondary | ICD-10-CM | POA: Diagnosis not present

## 2021-09-21 ENCOUNTER — Encounter: Payer: Self-pay | Admitting: Oncology

## 2021-09-21 LAB — LIPID PANEL
Chol/HDL Ratio: 2.7 ratio (ref 0.0–4.4)
Cholesterol, Total: 183 mg/dL (ref 100–199)
HDL: 69 mg/dL (ref >39)
LDL Chol Calc (NIH): 95 mg/dL (ref 0–99)
Triglycerides: 106 mg/dL (ref 0–149)
VLDL Cholesterol Cal: 19 mg/dL (ref 5–40)

## 2021-09-21 LAB — COMPREHENSIVE METABOLIC PANEL
ALT: 12 IU/L (ref 0–32)
AST: 17 IU/L (ref 0–40)
Albumin/Globulin Ratio: 1.9 (ref 1.2–2.2)
Albumin: 4.3 g/dL (ref 3.8–4.8)
Alkaline Phosphatase: 80 IU/L (ref 44–121)
BUN/Creatinine Ratio: 21 (ref 12–28)
BUN: 16 mg/dL (ref 8–27)
Bilirubin Total: 0.3 mg/dL (ref 0.0–1.2)
CO2: 24 mmol/L (ref 20–29)
Calcium: 10 mg/dL (ref 8.7–10.3)
Chloride: 103 mmol/L (ref 96–106)
Creatinine, Ser: 0.75 mg/dL (ref 0.57–1.00)
Globulin, Total: 2.3 g/dL (ref 1.5–4.5)
Glucose: 85 mg/dL (ref 70–99)
Potassium: 4.8 mmol/L (ref 3.5–5.2)
Sodium: 140 mmol/L (ref 134–144)
Total Protein: 6.6 g/dL (ref 6.0–8.5)
eGFR: 87 mL/min/{1.73_m2} (ref >59)

## 2021-09-21 LAB — HEMOGLOBIN A1C
Est. average glucose Bld gHb Est-mCnc: 120 mg/dL
Hgb A1c MFr Bld: 5.8 % — ABNORMAL HIGH (ref 4.8–5.6)

## 2021-09-21 LAB — TSH+FREE T4
Free T4: 1.32 ng/dL (ref 0.82–1.77)
TSH: 0.471 u[IU]/mL (ref 0.450–4.500)

## 2021-09-23 NOTE — Progress Notes (Signed)
[place]  Telephone:(336) [phone] Fax:(336) [phone]  ID: [name] OB: 1951-07-05  MR#: [id number]  CWC#:[id number]  Patient Care Team: [name], PA-C as PCP - General (Physician Assistant) Rico Junker, RN as Registered Nurse Marlyn Corporal Clearnce Sorrel, PA-C as Physician Assistant (Family Medicine) Lloyd Huger, MD as Consulting Physician (Oncology) Jovita Kussmaul, MD as Consulting Physician (General Surgery)   CHIEF COMPLAINT: Clinical stage IIa ER/PR positive, HER-2 negative invasive carcinoma of the central portion of the right breast.  INTERVAL HISTORY: Patient returns to clinic today for routine 33-month evaluation.  She continues to feel well and remains asymptomatic.  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  She is tolerating letrozole without significant side effects.  She has no neurologic complaints. She denies any recent fevers or illnesses. She has a good appetite and denies weight loss.  She has no chest pain, shortness of breath, cough, or hemoptysis. She denies any nausea, vomiting, constipation, or diarrhea.  She has no urinary complaints. She has no further joint pain.  Patient offers no specific complaints today.  REVIEW OF SYSTEMS:   Review of Systems  Constitutional: Negative.  Negative for fever, malaise/fatigue and weight loss.  Respiratory: Negative.  Negative for cough, hemoptysis and shortness of breath.   Cardiovascular: Negative.  Negative for chest pain and leg swelling.  Gastrointestinal: Negative.  Negative for abdominal pain, diarrhea and nausea.  Genitourinary: Negative.  Negative for dysuria.  Musculoskeletal: Negative.  Negative for back pain and myalgias.  Skin: Negative.  Negative for rash.  Neurological: Negative.  Negative for focal weakness, weakness and headaches.  Psychiatric/Behavioral: Negative.  The patient is not nervous/anxious.    As per HPI. Otherwise, a complete review of systems  is negative.  PAST MEDICAL HISTORY: Past Medical History:  Diagnosis Date   Allergy    Breast cancer, right ([place]) 2020   Currently taking hormonal chemo tx's.    Depression    Diabetes mellitus without complication ([place]) 7616   Dyspnea    due to weight   Hyperlipidemia    Hypertension    Osteoporosis    Trigeminal neuralgia     PAST SURGICAL HISTORY: Past Surgical History:  Procedure Laterality Date   ABDOMINAL HYSTERECTOMY  1978   BREAST BIOPSY Right 01/06/2019   [place] bx x 2.  Ribbon marker for mass and hydro for Lymph node. Path pending   BREAST CYST EXCISION Right 1986   BREAST EXCISIONAL BIOPSY     IR IMAGING GUIDED PORT INSERTION  05/12/2019   LAMINECTOMY  1995   L1, L2, L3   MASTECTOMY WITH AXILLARY LYMPH NODE DISSECTION Bilateral 11/17/2019   Procedure: BILATERAL MASTECTOMIES WITH RIGHT TARGETED SEED NODE DISSECTION;  Surgeon: Jovita Kussmaul, MD;  Location: [place];  Service: General;  Laterality: Bilateral;   PORT-A-CATH REMOVAL Left 02/25/2020   Procedure: REMOVAL PORT-A-CATH;  Surgeon: Jovita Kussmaul, MD;  Location: [place];  Service: General;  Laterality: Left;   REPLACEMENT TOTAL KNEE Left 04/28/2020   REPLACEMENT TOTAL KNEE Right 06/21/2020   SENTINEL NODE BIOPSY Right 11/17/2019   Procedure: RIGHT SENTINEL NODE BIOPSY;  Surgeon: Jovita Kussmaul, MD;  Location: [place];  Service: General;  Laterality: Right;   SPINE SURGERY      FAMILY HISTORY: Family History  Adopted: Yes  Family history unknown: Yes    ADVANCED DIRECTIVES (Y/N):  N  HEALTH MAINTENANCE: Social History   Tobacco  Use   Smoking status: Former    Types: Cigarettes    Quit date: 03/16/2000    Years since quitting: 21.5   Smokeless tobacco: Never  Vaping Use   Vaping Use: Never used  Substance Use Topics   Alcohol use: No   Drug use: No     Colonoscopy:  PAP:  Bone density:  Lipid panel:  Allergies  Allergen Reactions   Morphine  Anaphylaxis    Anaphallaxis.    Current Outpatient Medications  Medication Sig Dispense Refill   alendronate (FOSAMAX) 70 MG tablet TAKE ONE TABLET BY MOUTH ONCE A WEEK ON AN EMPTY STOMACH  WITH A FULL GLASS OF WATER 12 tablet 1   amLODipine (NORVASC) 5 MG tablet Take 1 tablet by mouth once daily 90 tablet 1   baclofen (LIORESAL) 10 MG tablet Take 1 tablet (10 mg total) by mouth daily. 90 tablet 0   bisoprolol-hydrochlorothiazide (ZIAC) 10-6.25 MG tablet Take 1 tablet by mouth 2 (two) times daily. 180 tablet 3   Cholecalciferol (VITAMIN D3) 5000 units CAPS Take by mouth.     letrozole (FEMARA) 2.5 MG tablet Take 1 tablet (2.5 mg total) by mouth daily. 90 tablet 0   lisinopril-hydrochlorothiazide (ZESTORETIC) 10-12.5 MG tablet Take 1 tablet by mouth daily. 90 tablet 0   loratadine (CLARITIN) 10 MG tablet TAKE 1 TABLET BY MOUTH ONCE DAILY AS NEEDED 90 tablet 1   metFORMIN (GLUCOPHAGE) 1000 MG tablet Take 1 tablet (1,000 mg total) by mouth 2 (two) times daily with a meal. 180 tablet 0   MULTIPLE VITAMINS PO MULTIVITAMINS (Oral Tablet)  1 Every Day for 0 days  Quantity: 0.00;  Refills: 0   Ordered :17-Apr-2011  [name] ;  Started 24-February-2009 Active     OMEGA 3-6-9 FATTY ACIDS PO Reported on 10/04/2015     simvastatin (ZOCOR) 20 MG tablet TAKE 1 TABLET BY MOUTH AT BEDTIME 90 tablet 1   Vitamin D, Ergocalciferol, (DRISDOL) 1.25 MG (50000 UNIT) CAPS capsule Take 1 capsule by mouth once a week 12 capsule 1   vitamin E 1000 UNIT capsule Take 1,000 Units by mouth daily.     No current facility-administered medications for this visit.   Facility-Administered Medications Ordered in Other Visits  Medication Dose Route Frequency Provider Last Rate Last Admin   heparin lock flush 100 unit/mL  500 Units Intravenous Once Grayland Ormond, Kathlene November, MD       sodium chloride flush (NS) 0.9 % injection 10 mL  10 mL Intravenous Once Lloyd Huger, MD      Graduate for companion yesterday  dog  OBJECTIVE: Vitals:   09/27/21 0955  BP: 117/87  Pulse: 66  Resp: 17  Temp: (!) 96.9 F (36.1 C)  SpO2: 99%     Body mass index is 30.73 kg/m.    ECOG FS:0 - Asymptomatic  General: Well-developed, well-nourished, no acute distress. Eyes: Pink conjunctiva, anicteric sclera. HEENT: Normocephalic, moist mucous membranes. Breast: Bilateral mastectomy. Lungs: No audible wheezing or coughing. Heart: Regular rate and rhythm. Abdomen: Soft, nontender, no obvious distention. Musculoskeletal: No edema, cyanosis, or clubbing. Neuro: Alert, answering all questions appropriately. Cranial nerves grossly intact. Skin: No rashes or petechiae noted. Psych: Normal affect.   LAB RESULTS:  Lab Results  Component Value Date   NA 140 09/20/2021   K 4.8 09/20/2021   CL 103 09/20/2021   CO2 24 09/20/2021   GLUCOSE 85 09/20/2021   BUN 16 09/20/2021   CREATININE 0.75 09/20/2021   CALCIUM  10.0 09/20/2021   PROT 6.6 09/20/2021   ALBUMIN 4.3 09/20/2021   AST 17 09/20/2021   ALT 12 09/20/2021   ALKPHOS 80 09/20/2021   BILITOT 0.3 09/20/2021   GFRNONAA 82 08/04/2020   GFRAA 95 08/04/2020    Lab Results  Component Value Date   WBC 6.9 08/04/2020   NEUTROABS 2.9 08/04/2020   HGB 12.5 08/04/2020   HCT 39.5 08/04/2020   MCV 79 08/04/2020   PLT 286 08/04/2020     STUDIES: No results found.  ASSESSMENT: Clinical stage IIa ER/PR positive, HER-2 negative invasive carcinoma of the central portion of the right breast.  Oncotype DX score 7.  PLAN:   1. Clinical stage IIa ER/PR positive, HER-2 negative invasive carcinoma of the central portion of the right breast: Although patient was a clinical stage IIa and typically neoadjuvant chemotherapy is the recommendation, she initially did not wish to pursue aggressive immunosuppressive treatment during the COVID-19 pandemic.  Instead, patient received neoadjuvant letrozole from April 2020 through August 2020.  She then proceeded with  neoadjuvant chemotherapy using Adriamycin, Cytoxan with Udenyca support followed by weekly Taxol x12. Patient completed her chemotherapy on September 30, 2019. She subsequently underwent bilateral mastectomy on November 17, 2019. Residual disease was noted in the breast and patient had evidence of micrometastasis in one lymph node. Because of her bilateral mastectomy, she did not require adjuvant XRT. Continue letrozole for a minimum of 5 years through April 2025. Although given her high risk disease, will likely extend treatment 7 to 10 years.  No intervention is needed this time.  Patient will have video-assisted telemedicine visit in 6 months for routine evaluation.   2.  Osteopenia: Bone mineral density on December 14, 2020 reported T score of -1.6 which is improved slightly over 1 year prior where the T score was reported -1.9.  Continue calcium and vitamin D.  Can consider discontinuing Fosamax in the future.  Repeat bone mineral density in March 2023.   3.  COVID positivity: Resolved.  Patient reports repeat testing is negative. It is likely that the groundglass densities seen on her CT scan from March 17, 2019 are residual from her infection.  No intervention is needed at this time. 3.  Thyroid nodule: Biopsy reported as benign.  Likely multinodular goiter. 4.  Left breast nodule: Bilateral mastectomy as above. No evidence of malignancy noted. 5.  Anemia: Resolved.  I spent a total of 20 minutes reviewing chart data, face-to-face evaluation with the patient, counseling and coordination of care as detailed above.   Patient expressed understanding and was in agreement with this plan. She also understands that She can call clinic at any time with any questions, concerns, or complaints.    Cancer Staging  Cancer of central portion of right female breast [facility]) Staging form: Breast, AJCC 8th Edition - Clinical stage from 01/13/2019: Stage IIA (cT2, cN1, cM0, G1, ER+, PR+, HER2-) - Signed by Lloyd Huger,  MD on 01/13/2019 Histologic grading system: 3 grade system   Lloyd Huger, MD   09/28/2021 9:59 AM

## 2021-09-23 NOTE — Progress Notes (Deleted)
°  [place]  Telephone:(336) [phone] Fax:(336) [phone]  ID: Nichole Martinez OB: July 23, 1951  MR#: [id number]  YTS#:[id number]  Patient Care Team: [name], Hershal Coria as PCP - General (Physician Assistant) Rico Junker, RN as Registered Nurse Marlyn Corporal Clearnce Sorrel, PA-C as Physician Assistant (Family Medicine) Lloyd Huger, MD as Consulting Physician (Oncology) Jovita Kussmaul, MD as Consulting Physician (General Surgery)   Lloyd Huger, MD   09/23/2021 8:48 AM    This encounter was created in error - please disregard.

## 2021-09-26 ENCOUNTER — Other Ambulatory Visit: Payer: Self-pay | Admitting: Physician Assistant

## 2021-09-26 DIAGNOSIS — E559 Vitamin D deficiency, unspecified: Secondary | ICD-10-CM

## 2021-09-26 DIAGNOSIS — M85851 Other specified disorders of bone density and structure, right thigh: Secondary | ICD-10-CM

## 2021-09-26 DIAGNOSIS — E7800 Pure hypercholesterolemia, unspecified: Secondary | ICD-10-CM

## 2021-09-26 DIAGNOSIS — I10 Essential (primary) hypertension: Secondary | ICD-10-CM

## 2021-09-26 DIAGNOSIS — Z853 Personal history of malignant neoplasm of breast: Secondary | ICD-10-CM

## 2021-09-27 ENCOUNTER — Inpatient Hospital Stay: Payer: Medicare PPO | Attending: Oncology | Admitting: Oncology

## 2021-09-27 ENCOUNTER — Encounter: Payer: Self-pay | Admitting: Oncology

## 2021-09-27 ENCOUNTER — Other Ambulatory Visit: Payer: Self-pay

## 2021-09-27 VITALS — BP 117/87 | HR 66 | Temp 96.9°F | Resp 17 | Wt 179.0 lb

## 2021-09-27 DIAGNOSIS — C50111 Malignant neoplasm of central portion of right female breast: Secondary | ICD-10-CM | POA: Insufficient documentation

## 2021-09-27 DIAGNOSIS — Z79811 Long term (current) use of aromatase inhibitors: Secondary | ICD-10-CM | POA: Insufficient documentation

## 2021-09-27 DIAGNOSIS — Z170 Estrogen receptor positive status [ER+]: Secondary | ICD-10-CM | POA: Insufficient documentation

## 2021-09-27 DIAGNOSIS — Z79899 Other long term (current) drug therapy: Secondary | ICD-10-CM | POA: Diagnosis not present

## 2021-09-27 NOTE — Progress Notes (Signed)
Patient here for oncology follow-up appointment, expresses no complaints or concerns at this time.    

## 2021-09-28 ENCOUNTER — Encounter: Payer: Self-pay | Admitting: Oncology

## 2021-10-11 ENCOUNTER — Encounter: Payer: Self-pay | Admitting: Obstetrics & Gynecology

## 2021-10-11 ENCOUNTER — Other Ambulatory Visit: Payer: Self-pay

## 2021-10-11 ENCOUNTER — Ambulatory Visit: Payer: Medicare PPO | Admitting: Obstetrics & Gynecology

## 2021-10-11 VITALS — BP 120/80 | Ht 64.0 in | Wt 179.0 lb

## 2021-10-11 DIAGNOSIS — N816 Rectocele: Secondary | ICD-10-CM

## 2021-10-11 NOTE — Progress Notes (Signed)
Cystocele/Rectocele Patient complains of a rectocele. Problem started several months ago. Symptoms include:  vaginal pressure and bulge; no pain, no bleeding, no GU symptoms, able to have BMs without difficulty . Symptoms have gradually worsened.  Not sexually active  Still works (ICU nurse, some lifting)  Prior NSVD x1.  Prior TAH.  PMHx: She  has a past medical history of Allergy, Breast cancer, right ([place]) (2020), Depression, Diabetes mellitus without complication ([place]) (6160), Dyspnea, Hyperlipidemia, Hypertension, Osteoporosis, and Trigeminal neuralgia. Also,  has a past surgical history that includes Laminectomy (1995); Abdominal hysterectomy (1978); Breast excisional biopsy; Breast biopsy (Right, 01/06/2019); Breast cyst excision (Right, 1986); Spine surgery; IR IMAGING GUIDED PORT INSERTION (05/12/2019); Mastectomy with axillary lymph node dissection (Bilateral, 11/17/2019); Sentinel node biopsy (Right, 11/17/2019); Port-a-cath removal (Left, 02/25/2020); Replacement total knee (Left, 04/28/2020); and Replacement total knee (Right, 06/21/2020)., She was adopted. Family history is unknown by patient.,  reports that she quit smoking about 21 years ago. Her smoking use included cigarettes. She has never used smokeless tobacco. She reports that she does not drink alcohol and does not use drugs.  She has a current medication list which includes the following prescription(s): alendronate, amlodipine, baclofen, bisoprolol-hydrochlorothiazide, vitamin d3, letrozole, lisinopril-hydrochlorothiazide, loratadine, metformin, multiple vitamin, omega 3-6-9 fatty acids, simvastatin, vitamin d (ergocalciferol), and vitamin e, and the following Facility-Administered Medications: heparin lock flush and sodium chloride flush. Also, is allergic to morphine.  Review of Systems  Constitutional:  Negative for chills, fever and malaise/fatigue.  HENT:  Negative for congestion, sinus pain and sore throat.   Eyes:  Negative  for blurred vision and pain.  Respiratory:  Negative for cough and wheezing.   Cardiovascular:  Negative for chest pain and leg swelling.  Gastrointestinal:  Negative for abdominal pain, constipation, diarrhea, heartburn, nausea and vomiting.  Genitourinary:  Negative for dysuria, frequency, hematuria and urgency.  Musculoskeletal:  Negative for back pain, joint pain, myalgias and neck pain.  Skin:  Negative for itching and rash.  Neurological:  Negative for dizziness, tremors and weakness.  Endo/Heme/Allergies:  Does not bruise/bleed easily.  Psychiatric/Behavioral:  Negative for depression. The patient is not nervous/anxious and does not have insomnia.    Objective: BP 120/80    Ht 5\' 4"  (1.626 m)    Wt 179 lb (81.2 kg)    BMI 30.73 kg/m  Physical Exam Constitutional:      General: She is not in acute distress.    Appearance: She is well-developed.  Genitourinary:     Bladder, rectum and urethral meatus normal.     Genitourinary Comments: Cuff intact/ no lesions Absent uterus and cervix     Vaginal cuff intact.    No vaginal erythema or bleeding.     Posterior and apical vaginal prolapse present.    Mild vaginal atrophy present.    Vaginal exam comments: Only Mild apical prolapse.      Right Adnexa: not tender and no mass present.    Left Adnexa: not tender and no mass present.    Cervix is absent.     Uterus is absent.     Pelvic exam was performed with patient in the lithotomy position.  Rectum:     Rectal exam comments: Gr 2 Rectocele.  HENT:     Head: Normocephalic and atraumatic.     Nose: Nose normal.  Abdominal:     General: There is no distension.     Palpations: Abdomen is soft.     Tenderness: There is no abdominal tenderness.  Musculoskeletal:        General: Normal range of motion.  Neurological:     Mental Status: She is alert and oriented to person, place, and time.     Cranial Nerves: No cranial nerve deficit.  Skin:    General: Skin is warm and dry.   Psychiatric:        Attention and Perception: Attention normal.        Mood and Affect: Mood normal.        Speech: Speech normal.        Behavior: Behavior normal.        Cognition and Memory: Cognition normal.        Judgment: Judgment normal.    ASSESSMENT/PLAN:   Problem List Items Addressed This Visit     Rectocele    -  Primary  Options of surgery and pessary use discussed as therapy Prefers pessary over surgery at this time  Pessary Fitting Patient presents for a pessary fitting. She desires a pessary as her means of controlling her symptoms of prolapse and/or urinary incontinence. She understands the care needed for a pessary and desires to proceed. Alternative treatment options have been discussed at length and the patient voices an understanding of each option.   PROCEDURE: The patient was placed in dorsal lithotomy position. Examination confirmed prolapse. A 2 3/4  Gellhorn pessary was fitted without difficulty.   She had prolapse of ring, donut, and gellhorn 2/14.  She had mild discomfort with gellhorn 2 3/4.  The patient subsequently ambulated, voided and performed valsalva maneuvers without dislodging the pessary and without discomfort. Care instructions were provided. Patient was discharged to home in stable condition.   As she had difficulty in finding a pessary that would not fall out or bother her w pain, she may consider surgery (posterior colporrhaphy).  Surgery and its recovery discussed in detail today.  Will give this pessary a 2 week trial and then we will examine and discuss again   Barnett Applebaum, MD, [place], [facility] 10/11/2021  10:00 AM

## 2021-10-11 NOTE — Patient Instructions (Addendum)
How to Use a Vaginal Pessary A vaginal pessary is a removable device that is placed into your vagina to support pelvic organs that droop. These organs include your uterus, bladder, and rectum. When your pelvic organs drop down into your vagina, it causes a condition called pelvic organ prolapse (POP). A pessary may be an alternative to surgery for women with POP. It may help women who leak urine when they strain or exercise (stress incontinence). This is a symptom of POP. A vaginal pessary may also be a temporary treatment for stress incontinence during pregnancy. There are several types of pessaries.  YOURS today is a GELLHORN 2 3/4 size. All types are usually made of silicone. You can insert and remove some on your own. Other types must be inserted and removed by your health care provider at office visits. The reason you are using a pessary and the severity of your condition will determine which one is best for you. It is also important to find the right size. A pessary that is too small may fall out. A pessary that is too large may cause pain or discomfort. Your health care provider will do a physical exam to find the correct size and fit for your pessary. It may take several appointments to find the best fit for you. If you can be fit with the type of pessary that you can insert, remove, and clean yourself, your health care provider will teach you how to use your pessary at home. You may have checkups every few months. If you have the type of pessary that needs to be inserted and removed by your health care provider, you will have appointments every few months to have the pessary removed, cleaned, and replaced. What are the risks? When properly fitted and cared for, risks of using a vaginal pessary can be small. However, there can be problems that may include: Vaginal discharge. Vaginal bleeding. A bad smell coming from your vagina. Scraping of the skin inside your vagina. How to use your  pessary Follow your health care provider's instructions for using a pessary. These instructions may vary, depending on the type of pessary you have. To insert a pessary: Wash your hands with soap and water for at least 20 seconds. Squeeze or fold the pessary in half and lubricate the tip with a water-based lubricant. Insert the pessary into your vagina. It will unfold and provide support. To remove the pessary, gently tug it out of your vagina. You can remove the pessary every night or after several days. You can also remove it to have sex. How to care for your pessary If you have a pessary that you can remove: Clean your pessary with soap and water. Rinse well. Dry it completely before inserting it back into your vagina. Follow these instructions at home: Take over-the-counter and prescription medicines only as told by your health care provider. Your health care provider may prescribe an estrogen cream to moisten your vagina. Keep all follow-up visits. This is important. Contact a health care provider if: You feel any pain or discomfort when your pessary is in place. You continue to have stress incontinence. You have trouble keeping your pessary from falling out. You have an unusual vaginal discharge that is blood-tinged or smells bad. Summary A vaginal pessary is a removable device that is placed into your vagina to support pelvic organs that droop. This condition is called pelvic organ prolapse (POP). There are several types of pessaries. Some you can insert and remove  on your own. Others must be inserted and removed by your health care provider. The best type for you depends on the reason you are using a pessary and the severity of your condition. It is also important to find the right size. If you can use the type that you insert and remove on your own, your health care provider will teach you how to use it and schedule checkups every few months. If you have the type that needs to be  inserted and removed by your health care provider, you will have regular appointments to have your pessary removed, cleaned, and replaced. This information is not intended to replace advice given to you by your health care provider. Make sure you discuss any questions you have with your health care provider. Document Revised: 03/02/2020 Document Reviewed: 03/02/2020 Elsevier Patient Education  [place].

## 2021-10-24 ENCOUNTER — Other Ambulatory Visit: Payer: Self-pay

## 2021-10-24 ENCOUNTER — Ambulatory Visit: Payer: Medicare PPO | Admitting: Obstetrics & Gynecology

## 2021-10-24 ENCOUNTER — Encounter: Payer: Self-pay | Admitting: Obstetrics & Gynecology

## 2021-10-24 VITALS — BP 120/80 | Ht 64.0 in | Wt 182.0 lb

## 2021-10-24 DIAGNOSIS — N816 Rectocele: Secondary | ICD-10-CM | POA: Diagnosis not present

## 2021-10-24 NOTE — Progress Notes (Signed)
°  HPI:      [name] is a 71 y.o. G2P0011 who presents today for her pessary follow up and examination related to her pelvic floor weakening.  Pt reports pessary Gellhorn expulsion on day1.  She had pessary expulsion of ring and donut types in office on day of pessary fitting. No vaginal bleeding and no vaginal discharge.  She desires surgery instead of continued pessary trials.  PMHx: She  has a past medical history of Allergy, Breast cancer, right ([place]) (2020), Depression, Diabetes mellitus without complication ([place]) (6967), Dyspnea, Hyperlipidemia, Hypertension, Osteoporosis, and Trigeminal neuralgia. Also,  has a past surgical history that includes Laminectomy (1995); Abdominal hysterectomy (1978); Breast excisional biopsy; Breast biopsy (Right, 01/06/2019); Breast cyst excision (Right, 1986); Spine surgery; IR IMAGING GUIDED PORT INSERTION (05/12/2019); Mastectomy with axillary lymph node dissection (Bilateral, 11/17/2019); Sentinel node biopsy (Right, 11/17/2019); Port-a-cath removal (Left, 02/25/2020); Replacement total knee (Left, 04/28/2020); and Replacement total knee (Right, 06/21/2020)., She was adopted. Family history is unknown by patient.,  reports that she quit smoking about 21 years ago. Her smoking use included cigarettes. She has never used smokeless tobacco. She reports that she does not drink alcohol and does not use drugs.  She has a current medication list which includes the following prescription(s): alendronate, amlodipine, baclofen, bisoprolol-hydrochlorothiazide, vitamin d3, letrozole, lisinopril-hydrochlorothiazide, loratadine, metformin, multiple vitamin, omega 3-6-9 fatty acids, simvastatin, vitamin d (ergocalciferol), and vitamin e, and the following Facility-Administered Medications: heparin lock flush and sodium chloride flush. Also, is allergic to morphine.  Review of Systems  All other systems reviewed and are negative.  Objective: BP 120/80    Ht 5\' 4"  (1.626 m)     Wt 182 lb (82.6 kg)    BMI 31.24 kg/m  Physical Exam Constitutional:      General: She is not in acute distress.    Appearance: She is well-developed.  Musculoskeletal:        General: Normal range of motion.  Neurological:     Mental Status: She is alert and oriented to person, place, and time.  Skin:    General: Skin is warm and dry.  Vitals reviewed.    A/P:   ICD-10-CM   1. Rectocele  N81.6     Pessary expulsion of all types Surgery discussed (posterior colporrhaphy) Plan when she has availability (she prefers in the fall)  A total of 20 minutes were spent face-to-face with the patient as well as preparation, review, communication, and documentation during this encounter.   Barnett Applebaum, MD, [facility], [facility] 10/24/2021  1:23 PM

## 2021-12-12 ENCOUNTER — Ambulatory Visit: Payer: Medicare PPO | Admitting: Physician Assistant

## 2021-12-12 ENCOUNTER — Other Ambulatory Visit: Payer: Self-pay

## 2021-12-12 ENCOUNTER — Encounter: Payer: Self-pay | Admitting: Physician Assistant

## 2021-12-12 VITALS — BP 146/90 | HR 67 | Temp 98.6°F | Resp 16 | Ht 64.0 in | Wt 183.9 lb

## 2021-12-12 DIAGNOSIS — H9202 Otalgia, left ear: Secondary | ICD-10-CM

## 2021-12-12 MED ORDER — FLUTICASONE PROPIONATE 50 MCG/ACT NA SUSP: 2.0000 | Freq: Every day | NASAL | 6 refills | Status: DC

## 2021-12-12 MED ORDER — HYDROCORTISONE-ACETIC ACID 1-2 % OT SOLN: 3.0000 [drp] | Freq: Two times a day (BID) | OTIC | 0 refills | Status: DC

## 2021-12-12 NOTE — Progress Notes (Signed)
?  ? ? ?I,Kearstyn Avitia Robinson,acting as a Education administrator for [employer], PA-C.,have documented all relevant documentation on the behalf of Mardene Speak, PA-C,as directed by  [employer], PA-C while in the presence of [employer], PA-C.  ?Established patient visit ? ? ?Patient: Nichole Martinez   DOB: Oct 06, 1950   71 y.o. Female  MRN: [id number] ?Visit Date: 12/12/2021 ? ?Today's healthcare provider: Mardene Speak, PA-C  ? ?Chief Complaint  ?Patient presents with  ? Ear Pain  ? ?Subjective  ?  ?Otalgia  ?There is pain in the left ear. This is a new problem. The current episode started today (3:00 am). The problem occurs constantly. The problem has been unchanged. There has been no fever. The pain is at a severity of 4/10. The pain is moderate. Pertinent negatives include no abdominal pain, coughing, diarrhea, ear discharge, headaches, hearing loss, neck pain, rash, rhinorrhea, sore throat or vomiting. Treatments tried: warm compress. The treatment provided mild relief. There is no history of a chronic ear infection, hearing loss or a tympanostomy tube.   ? ?PMHx significant for allergic rhinitis, trigeminal neuralgia. ? ?Medications: ?Outpatient Medications Prior to Visit  ?Medication Sig  ? alendronate (FOSAMAX) 70 MG tablet TAKE ONE TABLET BY MOUTH ONCE A WEEK ON AN EMPTY STOMACH  WITH A FULL GLASS OF WATER  ? amLODipine (NORVASC) 5 MG tablet Take 1 tablet by mouth once daily  ? baclofen (LIORESAL) 10 MG tablet Take 1 tablet (10 mg total) by mouth daily.  ? Cholecalciferol (VITAMIN D3) 5000 units CAPS Take by mouth.  ? letrozole (FEMARA) 2.5 MG tablet Take 1 tablet (2.5 mg total) by mouth daily.  ? lisinopril-hydrochlorothiazide (ZESTORETIC) 10-12.5 MG tablet Take 1 tablet by mouth daily.  ? loratadine (CLARITIN) 10 MG tablet TAKE 1 TABLET BY MOUTH ONCE DAILY AS NEEDED  ? metFORMIN (GLUCOPHAGE) 1000 MG tablet Take 1 tablet (1,000 mg total) by mouth 2 (two) times daily with a meal.  ? MULTIPLE VITAMINS PO MULTIVITAMINS  (Oral Tablet)  1 Every Day for 0 days  Quantity: 0.00;  Refills: 0   Ordered :17-Apr-2011  [name] ;  Started 24-February-2009 Active  ? OMEGA 3-6-9 FATTY ACIDS PO Reported on 10/04/2015  ? simvastatin (ZOCOR) 20 MG tablet TAKE 1 TABLET BY MOUTH AT BEDTIME  ? Vitamin D, Ergocalciferol, (DRISDOL) 1.25 MG (50000 UNIT) CAPS capsule Take 1 capsule by mouth once a week  ? vitamin E 1000 UNIT capsule Take 1,000 Units by mouth daily.  ? bisoprolol-hydrochlorothiazide (ZIAC) 10-6.25 MG tablet Take 1 tablet by mouth 2 (two) times daily.  ? ?Facility-Administered Medications Prior to Visit  ?Medication Dose Route Frequency Provider  ? heparin lock flush 100 unit/mL  500 Units Intravenous Once Lloyd Huger, MD  ? sodium chloride flush (NS) 0.9 % injection 10 mL  10 mL Intravenous Once Lloyd Huger, MD  ? ? ?Review of Systems  ?HENT:  Positive for ear pain. Negative for ear discharge, hearing loss, rhinorrhea and sore throat.   ?Respiratory:  Negative for cough.   ?Gastrointestinal:  Negative for abdominal pain, diarrhea and vomiting.  ?Musculoskeletal:  Negative for neck pain.  ?Skin:  Negative for rash.  ?Neurological:  Negative for headaches.  ? ?  Objective  ?  ?BP (!) 146/90 (BP Location: Left Arm, Patient Position: Sitting, Cuff Size: Normal)   Pulse 67   Temp 98.6 ?F (37 ?C) (Oral)   Resp 16   Ht '5\' 4"'$  (1.626 m)   Wt 183 lb 14.4  oz (83.4 kg)   SpO2 99%   BMI 31.57 kg/m?  ? ? ?Physical Exam ?Vitals and nursing note reviewed.  ?Constitutional:   ?   Appearance: Normal appearance.  ?HENT:  ?   Head: Normocephalic and atraumatic.  ?   Ears:  ?   Comments: Fluids behind TMs ?Cardiovascular:  ?   Rate and Rhythm: Normal rate.  ?   Pulses: Normal pulses.  ?Pulmonary:  ?   Effort: Pulmonary effort is normal.  ?Neurological:  ?   Mental Status: She is alert.  ? ?No results found for any visits on 12/12/21. ? Assessment & Plan  ?  ? ?1. Left ear pain ?Due to suspected otitis media with effusion vs mild otitis  externa ?- fluticasone (FLONASE) 50 MCG/ACT nasal spray; Place 2 sprays into both nostrils daily.  Dispense: 16 g; Refill: 6 ?- acetic acid-hydrocortisone (VOSOL-HC) OTIC solution; Place 3 drops into both ears 2 (two) times daily.  Dispense: 10 mL; Refill: 0  ?Patient is going to go to a vacation this weekend. ?FU after her return, in a week and then with her PCP. ?  The patient was advised to call back or seek an in-person evaluation if the symptoms worsen or if the condition fails to improve as anticipated. ? ?I discussed the assessment and treatment plan with the patient. The patient was provided an opportunity to ask questions and all were answered. The patient agreed with the plan and demonstrated an understanding of the instructions. ? ?The entirety of the information documented in the History of Present Illness, Review of Systems and Physical Exam were personally obtained by me. Portions of this information were initially documented by the CMA and reviewed by me for thoroughness and accuracy.   ? ? ?Mardene Speak, PA-C  ?[place] ?[phone] (phone) ?[phone] (fax) ? ?[facility] ?

## 2021-12-18 ENCOUNTER — Other Ambulatory Visit: Payer: Medicare PPO

## 2021-12-25 NOTE — Progress Notes (Signed)
? ?I,Sha'taria Tyson,acting as a Education administrator for [employer], PA-C.,have documented all relevant documentation on the behalf of [name], PA-C,as directed by  [name], PA-C while in the presence of [name], PA-C. ? ?Established Patient Office Visit ? ?Subjective:  ?Patient ID: Nichole Martinez, female    DOB: [birth year]  Age: 71 y.o. MRN: [id number] ? ?CC: cc left ear pain ? ? ?HPI ?Nichole Martinez presents for follow up. Patient reports Nichole Martinez was recently seen for ear pain in left ear and was not able to get drops that was prescribed due to nationwide outage of rx. States pain has unchanged since seen but was leaving for cruise and has now returned. Pain fluctuates. Nichole Martinez has been using flonase since her last appointment.  ? ?Past Medical History:  ?Diagnosis Date  ? Allergy   ? Breast cancer, right ([place]) 2020  ? Currently taking hormonal chemo tx's.   ? Depression   ? Diabetes mellitus without complication ([place]) 0932  ? Dyspnea   ? due to weight  ? Hyperlipidemia   ? Hypertension   ? Osteoporosis   ? Trigeminal neuralgia   ? ? ?Past Surgical History:  ?Procedure Laterality Date  ? ABDOMINAL HYSTERECTOMY  1978  ? BREAST BIOPSY Right 01/06/2019  ? [place] bx x 2.  Ribbon marker for mass and hydro for Lymph node. Path pending  ? BREAST CYST EXCISION Right 1986  ? BREAST EXCISIONAL BIOPSY    ? IR IMAGING GUIDED PORT INSERTION  05/12/2019  ? LAMINECTOMY  1995  ? L1, L2, L3  ? MASTECTOMY WITH AXILLARY LYMPH NODE DISSECTION Bilateral 11/17/2019  ? Procedure: BILATERAL MASTECTOMIES WITH RIGHT TARGETED SEED NODE DISSECTION;  Surgeon: Jovita Kussmaul, MD;  Location: [facility];  Service: General;  Laterality: Bilateral;  ? PORT-A-CATH REMOVAL Left 02/25/2020  ? Procedure: REMOVAL PORT-A-CATH;  Surgeon: Jovita Kussmaul, MD;  Location: [facility];  Service: General;  Laterality: Left;  ? REPLACEMENT TOTAL KNEE Left 04/28/2020  ? REPLACEMENT TOTAL KNEE Right 06/21/2020  ? SENTINEL NODE BIOPSY  Right 11/17/2019  ? Procedure: RIGHT SENTINEL NODE BIOPSY;  Surgeon: Jovita Kussmaul, MD;  Location: [facility];  Service: General;  Laterality: Right;  ? SPINE SURGERY    ? ? ?Family History  ?Adopted: Yes  ?Family history unknown: Yes  ? ? ?Social History  ? ?Socioeconomic History  ? Marital status: Divorced  ?  Spouse name: Not on file  ? Number of children: 1  ? Years of education: Not on file  ? Highest education level: Not on file  ?Occupational History  ? Occupation: Therapist, sports  ?  Employer: [employer]  ?Tobacco Use  ? Smoking status: Former  ?  Types: Cigarettes  ?  Quit date: 03/16/2000  ?  Years since quitting: 21.7  ? Smokeless tobacco: Never  ?Vaping Use  ? Vaping Use: Never used  ?Substance and Sexual Activity  ? Alcohol use: No  ? Drug use: No  ? Sexual activity: Not on file  ?Other Topics Concern  ? Not on file  ?Social History Narrative  ? Not on file  ? ?Social Determinants of Health  ? ?Financial Resource Strain: Low Risk   ? Difficulty of Paying Living Expenses: Not hard at all  ?Food Insecurity: No Food Insecurity  ? Worried About Charity fundraiser in the Last Year: Never true  ? Ran Out of Food in the Last Year: Never true  ?Transportation Needs:  No Transportation Needs  ? Lack of Transportation (Medical): No  ? Lack of Transportation (Non-Medical): No  ?Physical Activity: Insufficiently Active  ? Days of Exercise per Week: 7 days  ? Minutes of Exercise per Session: 20 min  ?Stress: No Stress Concern Present  ? Feeling of Stress : Only a little  ?Social Connections: Socially Isolated  ? Frequency of Communication with Friends and Family: More than three times a week  ? Frequency of Social Gatherings with Friends and Family: More than three times a week  ? Attends Religious Services: Never  ? Active Member of Clubs or Organizations: No  ? Attends Archivist Meetings: Never  ? Marital Status: Divorced  ?Intimate Partner Violence: Not At Risk  ? Fear of Current or Ex-Partner: No   ? Emotionally Abused: No  ? Physically Abused: No  ? Sexually Abused: No  ? ? ?Outpatient Medications Prior to Visit  ?Medication Sig Dispense Refill  ? acetic acid-hydrocortisone (VOSOL-HC) OTIC solution Place 3 drops into both ears 2 (two) times daily. 10 mL 0  ? alendronate (FOSAMAX) 70 MG tablet TAKE ONE TABLET BY MOUTH ONCE A WEEK ON AN EMPTY STOMACH  WITH A FULL GLASS OF WATER 12 tablet 1  ? amLODipine (NORVASC) 5 MG tablet Take 1 tablet by mouth once daily 90 tablet 1  ? baclofen (LIORESAL) 10 MG tablet Take 1 tablet (10 mg total) by mouth daily. 90 tablet 0  ? Cholecalciferol (VITAMIN D3) 5000 units CAPS Take by mouth.    ? fluticasone (FLONASE) 50 MCG/ACT nasal spray Place 2 sprays into both nostrils daily. 16 g 6  ? letrozole (FEMARA) 2.5 MG tablet Take 1 tablet (2.5 mg total) by mouth daily. 90 tablet 0  ? lisinopril-hydrochlorothiazide (ZESTORETIC) 10-12.5 MG tablet Take 1 tablet by mouth daily. 90 tablet 0  ? loratadine (CLARITIN) 10 MG tablet TAKE 1 TABLET BY MOUTH ONCE DAILY AS NEEDED 90 tablet 1  ? metFORMIN (GLUCOPHAGE) 1000 MG tablet Take 1 tablet (1,000 mg total) by mouth 2 (two) times daily with a meal. 180 tablet 0  ? MULTIPLE VITAMINS PO MULTIVITAMINS (Oral Tablet)  1 Every Day for 0 days  Quantity: 0.00;  Refills: 0   Ordered :17-Apr-2011  [name] ;  Started 24-February-2009 Active    ? OMEGA 3-6-9 FATTY ACIDS PO Reported on 10/04/2015    ? simvastatin (ZOCOR) 20 MG tablet TAKE 1 TABLET BY MOUTH AT BEDTIME 90 tablet 1  ? Vitamin D, Ergocalciferol, (DRISDOL) 1.25 MG (50000 UNIT) CAPS capsule Take 1 capsule by mouth once a week 12 capsule 1  ? vitamin E 1000 UNIT capsule Take 1,000 Units by mouth daily.    ? ?Facility-Administered Medications Prior to Visit  ?Medication Dose Route Frequency Provider Last Rate Last Admin  ? heparin lock flush 100 unit/mL  500 Units Intravenous Once Lloyd Huger, MD      ? sodium chloride flush (NS) 0.9 % injection 10 mL  10 mL Intravenous Once Lloyd Huger, MD      ? ? ?Allergies  ?Allergen Reactions  ? Morphine Anaphylaxis  ?  Anaphallaxis.  ? ? ?ROS ?Review of Systems  ?Constitutional:  Negative for fatigue and fever.  ?HENT:  Positive for ear pain.   ?Respiratory:  Negative for cough and shortness of breath.   ?Cardiovascular:  Negative for chest pain and leg swelling.  ?Gastrointestinal:  Negative for abdominal pain.  ?Neurological:  Negative for dizziness and headaches.  ? ?  ?  Objective:  ?  ?Physical Exam ?Constitutional:   ?   Appearance: Normal appearance. Nichole Martinez is not ill-appearing.  ?HENT:  ?   Head: Normocephalic.  ?   Left Ear: Tympanic membrane normal.  ?   Ears:  ?   Comments: Mild erythema L ear canal ?Cardiovascular:  ?   Rate and Rhythm: Normal rate.  ?Pulmonary:  ?   Effort: Pulmonary effort is normal.  ? ? ?There were no vitals taken for this visit. ?Wt Readings from Last 3 Encounters:  ?12/12/21 183 lb 14.4 oz (83.4 kg)  ?10/24/21 182 lb (82.6 kg)  ?10/11/21 179 lb (81.2 kg)  ? ? ? ?Health Maintenance Due  ?Topic Date Due  ? OPHTHALMOLOGY EXAM  03/07/2017  ? FOOT EXAM  03/28/2020  ? ? ?There are no preventive care reminders to display for this patient. ? ?Lab Results  ?Component Value Date  ? TSH 0.471 09/20/2021  ? ?Lab Results  ?Component Value Date  ? WBC 6.9 08/04/2020  ? HGB 12.5 08/04/2020  ? HCT 39.5 08/04/2020  ? MCV 79 08/04/2020  ? PLT 286 08/04/2020  ? ?Lab Results  ?Component Value Date  ? NA 140 09/20/2021  ? K 4.8 09/20/2021  ? CO2 24 09/20/2021  ? GLUCOSE 85 09/20/2021  ? BUN 16 09/20/2021  ? CREATININE 0.75 09/20/2021  ? BILITOT 0.3 09/20/2021  ? ALKPHOS 80 09/20/2021  ? AST 17 09/20/2021  ? ALT 12 09/20/2021  ? PROT 6.6 09/20/2021  ? ALBUMIN 4.3 09/20/2021  ? CALCIUM 10.0 09/20/2021  ? ANIONGAP 8 02/22/2020  ? EGFR 87 09/20/2021  ? ?Lab Results  ?Component Value Date  ? CHOL 183 09/20/2021  ? ?Lab Results  ?Component Value Date  ? HDL 69 09/20/2021  ? ?Lab Results  ?Component Value Date  ? [place] 95 09/20/2021  ? ?Lab  Results  ?Component Value Date  ? TRIG 106 09/20/2021  ? ?Lab Results  ?Component Value Date  ? CHOLHDL 2.7 09/20/2021  ? ?Lab Results  ?Component Value Date  ? HGBA1C 5.8 (H) 09/20/2021  ? ? ?  ?Assessment &

## 2021-12-26 ENCOUNTER — Encounter: Payer: Self-pay | Admitting: Physician Assistant

## 2021-12-26 ENCOUNTER — Ambulatory Visit: Payer: Medicare PPO | Admitting: Physician Assistant

## 2021-12-26 VITALS — BP 127/80 | HR 53 | Ht 64.0 in | Wt 184.3 lb

## 2021-12-26 DIAGNOSIS — H60502 Unspecified acute noninfective otitis externa, left ear: Secondary | ICD-10-CM

## 2021-12-26 MED ORDER — CIPROFLOXACIN-DEXAMETHASONE 0.3-0.1 % OT SUSP: 4.0000 [drp] | Freq: Two times a day (BID) | OTIC | 0 refills | Status: DC

## 2022-01-29 ENCOUNTER — Inpatient Hospital Stay: Admission: RE | Admit: 2022-01-29 | Payer: Medicare PPO | Source: Ambulatory Visit

## 2022-03-26 ENCOUNTER — Encounter: Payer: Self-pay | Admitting: Physician Assistant

## 2022-03-26 ENCOUNTER — Ambulatory Visit: Payer: Medicare PPO | Admitting: Physician Assistant

## 2022-03-26 ENCOUNTER — Other Ambulatory Visit: Payer: Self-pay | Admitting: Physician Assistant

## 2022-03-26 VITALS — BP 129/93 | HR 71 | Ht 64.5 in | Wt 180.0 lb

## 2022-03-26 DIAGNOSIS — M85851 Other specified disorders of bone density and structure, right thigh: Secondary | ICD-10-CM | POA: Insufficient documentation

## 2022-03-26 DIAGNOSIS — E785 Hyperlipidemia, unspecified: Secondary | ICD-10-CM | POA: Diagnosis not present

## 2022-03-26 DIAGNOSIS — G500 Trigeminal neuralgia: Secondary | ICD-10-CM | POA: Diagnosis not present

## 2022-03-26 DIAGNOSIS — E1159 Type 2 diabetes mellitus with other circulatory complications: Secondary | ICD-10-CM

## 2022-03-26 DIAGNOSIS — I152 Hypertension secondary to endocrine disorders: Secondary | ICD-10-CM

## 2022-03-26 DIAGNOSIS — J309 Allergic rhinitis, unspecified: Secondary | ICD-10-CM

## 2022-03-26 DIAGNOSIS — E1169 Type 2 diabetes mellitus with other specified complication: Secondary | ICD-10-CM | POA: Diagnosis not present

## 2022-03-26 DIAGNOSIS — E119 Type 2 diabetes mellitus without complications: Secondary | ICD-10-CM

## 2022-03-26 LAB — POCT GLYCOSYLATED HEMOGLOBIN (HGB A1C): Hemoglobin A1C: 5.6 % (ref 4.0–5.6)

## 2022-03-26 MED ORDER — LORATADINE 10 MG PO TABS: 10.0000 mg | ORAL_TABLET | Freq: Every day | ORAL | 1 refills | Status: DC | PRN

## 2022-03-26 MED ORDER — AMLODIPINE BESYLATE 5 MG PO TABS: 5.0000 mg | ORAL_TABLET | Freq: Every day | ORAL | 1 refills | Status: DC

## 2022-03-26 MED ORDER — LISINOPRIL-HYDROCHLOROTHIAZIDE 10-12.5 MG PO TABS: 1.0000 | ORAL_TABLET | Freq: Every day | ORAL | 1 refills | Status: DC

## 2022-03-26 MED ORDER — ALENDRONATE SODIUM 70 MG PO TABS: ORAL_TABLET | ORAL | 2 refills | Status: DC

## 2022-03-26 MED ORDER — SIMVASTATIN 20 MG PO TABS: 20.0000 mg | ORAL_TABLET | Freq: Every day | ORAL | 1 refills | Status: DC

## 2022-03-26 MED ORDER — METFORMIN HCL 1000 MG PO TABS: 1000.0000 mg | ORAL_TABLET | Freq: Two times a day (BID) | ORAL | 1 refills | Status: DC

## 2022-03-26 MED ORDER — BACLOFEN 10 MG PO TABS: 10.0000 mg | ORAL_TABLET | Freq: Every day | ORAL | 1 refills | Status: DC

## 2022-03-26 NOTE — Assessment & Plan Note (Signed)
Currently on fosamax, also on letrozole d/t breast cancer hx Bone density scans managed by oncology for now, last was in 2022.  Started fosamax 2021, can continue until 2026

## 2022-03-26 NOTE — Assessment & Plan Note (Signed)
Managed with simvastatin 20 mg Reviewed last lipid panel. Goal is LDL < 70, not quite there but will hold off changing dose  Repeat fasting lipids at next visit

## 2022-03-26 NOTE — Assessment & Plan Note (Signed)
Managed with baclofen. Takes for a few weeks maybe twice a year. Refilled so pt has on hand

## 2022-03-26 NOTE — Progress Notes (Signed)
   I,Sha'taria Tyson,acting as a scribe for Lindsay Drubel, PA-C.,have documented all relevant documentation on the behalf of Lindsay Drubel, PA-C,as directed by  Lindsay Drubel, PA-C while in the presence of Lindsay Drubel, PA-C.   Established patient visit   Patient: Nichole Martinez   DOB: 03/11/1951   70 y.o. Female  MRN: [id number] Visit Date: 03/26/2022  Today's healthcare provider: Lindsay Drubel, PA-C   Cc. Htn, dmII f/u  Subjective    HPI  Hypertension, follow-up  BP Readings from Last 3 Encounters:  03/26/22 (!) 129/93  12/26/21 127/80  12/12/21 (!) 146/90   Wt Readings from Last 3 Encounters:  03/26/22 180 lb (81.6 kg)  12/26/21 184 lb 4.8 oz (83.6 kg)  12/12/21 183 lb 14.4 oz (83.4 kg)     She was last seen for hypertension 6 months ago.  BP at that visit was 122/70. Management since that visit includes no changes.  She reports excellent compliance with treatment. She is not having side effects. She is following a Low Sodium diet. She is exercising. She does not smoke.  Use of agents associated with hypertension: none.   Outside blood pressures are checked once every two weeks, 130s/70s Symptoms: No chest pain No chest pressure  No palpitations No syncope  No dyspnea No orthopnea  No paroxysmal nocturnal dyspnea No lower extremity edema  -Reports a little brain fog Pertinent labs Lab Results  Component Value Date   CHOL 183 09/20/2021   HDL 69 09/20/2021   LDLCALC 95 09/20/2021   TRIG 106 09/20/2021   CHOLHDL 2.7 09/20/2021   Lab Results  Component Value Date   NA 140 09/20/2021   K 4.8 09/20/2021   CREATININE 0.75 09/20/2021   EGFR 87 09/20/2021   GLUCOSE 85 09/20/2021   TSH 0.471 09/20/2021     The 10-year ASCVD risk score (Arnett DK, et al., 2019) is: 17.4%  ---------------------------------------------------------------------------------------------------   Medications: Outpatient Medications Prior to Visit  Medication Sig    Cholecalciferol (VITAMIN D3) 5000 units CAPS Take by mouth.   fluticasone (FLONASE) 50 MCG/ACT nasal spray Place 2 sprays into both nostrils daily.   letrozole (FEMARA) 2.5 MG tablet Take 1 tablet (2.5 mg total) by mouth daily.   MULTIPLE VITAMINS PO MULTIVITAMINS (Oral Tablet)  1 Every Day for 0 days  Quantity: 0.00;  Refills: 0   Ordered :17-Apr-2011  King, Sandy ;  Started 24-February-2009 Active   OMEGA 3-6-9 FATTY ACIDS PO Reported on 10/04/2015   Vitamin D, Ergocalciferol, (DRISDOL) 1.25 MG (50000 UNIT) CAPS capsule Take 1 capsule by mouth once a week   vitamin E 1000 UNIT capsule Take 1,000 Units by mouth daily.   [DISCONTINUED] alendronate (FOSAMAX) 70 MG tablet TAKE ONE TABLET BY MOUTH ONCE A WEEK ON AN EMPTY STOMACH  WITH A FULL GLASS OF WATER   [DISCONTINUED] amLODipine (NORVASC) 5 MG tablet Take 1 tablet by mouth once daily   [DISCONTINUED] baclofen (LIORESAL) 10 MG tablet Take 1 tablet (10 mg total) by mouth daily.   [DISCONTINUED] lisinopril-hydrochlorothiazide (ZESTORETIC) 10-12.5 MG tablet Take 1 tablet by mouth daily.   [DISCONTINUED] loratadine (CLARITIN) 10 MG tablet TAKE 1 TABLET BY MOUTH ONCE DAILY AS NEEDED   [DISCONTINUED] metFORMIN (GLUCOPHAGE) 1000 MG tablet Take 1 tablet (1,000 mg total) by mouth 2 (two) times daily with a meal.   [DISCONTINUED] simvastatin (ZOCOR) 20 MG tablet TAKE 1 TABLET BY MOUTH AT BEDTIME   [DISCONTINUED] ciprofloxacin-dexamethasone (CIPRODEX) OTIC suspension Place 4 drops into the left ear   2 (two) times daily.   Facility-Administered Medications Prior to Visit  Medication Dose Route Frequency Provider   heparin lock flush 100 unit/mL  500 Units Intravenous Once Finnegan, Timothy J, MD   sodium chloride flush (NS) 0.9 % injection 10 mL  10 mL Intravenous Once Finnegan, Timothy J, MD    Review of Systems  Constitutional:  Negative for fatigue and fever.  Respiratory:  Negative for cough and shortness of breath.   Cardiovascular:  Negative for chest  pain and leg swelling.  Gastrointestinal:  Negative for abdominal pain.  Neurological:  Negative for dizziness and headaches.       Objective    Blood pressure (!) 129/93, pulse 71, height 5' 4.5" (1.638 m), weight 180 lb (81.6 kg), SpO2 99 %.   Physical Exam Constitutional:      General: She is awake.     Appearance: She is well-developed.  HENT:     Head: Normocephalic.  Eyes:     Conjunctiva/sclera: Conjunctivae normal.  Cardiovascular:     Rate and Rhythm: Normal rate and regular rhythm.     Pulses:          Dorsalis pedis pulses are 3+ on the right side and 3+ on the left side.     Heart sounds: Normal heart sounds.  Pulmonary:     Effort: Pulmonary effort is normal.     Breath sounds: Normal breath sounds.  Feet:     Right foot:     Protective Sensation: 4 sites tested.  4 sites sensed.     Skin integrity: Skin integrity normal.     Toenail Condition: Right toenails are abnormally thick. Fungal disease present.    Left foot:     Protective Sensation: 4 sites tested.  4 sites sensed.     Skin integrity: Skin integrity normal.     Toenail Condition: Left toenails are abnormally thick. Fungal disease present. Skin:    General: Skin is warm.  Neurological:     Mental Status: She is alert and oriented to person, place, and time.  Psychiatric:        Attention and Perception: Attention normal.        Mood and Affect: Mood normal.        Speech: Speech normal.        Behavior: Behavior is cooperative.     Results for orders placed or performed in visit on 03/26/22  POCT glycosylated hemoglobin (Hb A1C)  Result Value Ref Range   Hemoglobin A1C 5.6 4.0 - 5.6 %   HbA1c POC (<> result, manual entry)     HbA1c, POC (prediabetic range)     HbA1c, POC (controlled diabetic range)      Assessment & Plan     Problem List Items Addressed This Visit       Cardiovascular and Mediastinum   Hypertension associated with diabetes (HCC)    Well controlled, pt worked night  shift last night.  Encouraged her to continue to sporadically check at home Reviewed last cmp F/u 6 mo      Relevant Medications   amLODipine (NORVASC) 5 MG tablet   lisinopril-hydrochlorothiazide (ZESTORETIC) 10-12.5 MG tablet   metFORMIN (GLUCOPHAGE) 1000 MG tablet   simvastatin (ZOCOR) 20 MG tablet     Respiratory   Allergic rhinitis    Refilled claritin      Relevant Medications   loratadine (CLARITIN) 10 MG tablet     Endocrine   Diabetes mellitus, type 2 (HCC) - Primary      A1c today stable, pre-dm range. Continue metformin. Foot exam completed today On statin Needs uacr next visit F/u 6 mo      Relevant Medications   lisinopril-hydrochlorothiazide (ZESTORETIC) 10-12.5 MG tablet   metFORMIN (GLUCOPHAGE) 1000 MG tablet   simvastatin (ZOCOR) 20 MG tablet   Other Relevant Orders   POCT glycosylated hemoglobin (Hb A1C) (Completed)   Hyperlipidemia associated with type 2 diabetes mellitus (HCC)    Managed with simvastatin 20 mg Reviewed last lipid panel. Goal is LDL < 70, not quite there but will hold off changing dose  Repeat fasting lipids at next visit      Relevant Medications   amLODipine (NORVASC) 5 MG tablet   lisinopril-hydrochlorothiazide (ZESTORETIC) 10-12.5 MG tablet   metFORMIN (GLUCOPHAGE) 1000 MG tablet   simvastatin (ZOCOR) 20 MG tablet     Nervous and Auditory   Trigeminal neuralgia    Managed with baclofen. Takes for a few weeks maybe twice a year. Refilled so pt has on hand      Relevant Medications   baclofen (LIORESAL) 10 MG tablet     Musculoskeletal and Integument   Osteopenia of neck of right femur    Currently on fosamax, also on letrozole d/t breast cancer hx Bone density scans managed by oncology for now, last was in 2022.  Started fosamax 2021, can continue until 2026      Relevant Medications   alendronate (FOSAMAX) 70 MG tablet     Return in about 4 months (around 07/27/2022) for CPE.      I, Lindsay Drubel, PA-C have  reviewed all documentation for this visit. The documentation on  03/26/2022  for the exam, diagnosis, procedures, and orders are all accurate and complete.  Lindsay Drubel, PA-C [facility] [address] [place], [place], [address] Office: [phone] Fax: [phone]   [facility]  

## 2022-03-26 NOTE — Assessment & Plan Note (Signed)
A1c today stable, pre-dm range. Continue metformin. Foot exam completed today On statin Needs uacr next visit F/u 6 mo

## 2022-03-26 NOTE — Assessment & Plan Note (Signed)
Refilled claritin

## 2022-03-26 NOTE — Assessment & Plan Note (Signed)
Well controlled, pt worked night shift last night.  Encouraged her to continue to sporadically check at home Reviewed last cmp F/u 6 mo

## 2022-04-02 ENCOUNTER — Inpatient Hospital Stay: Payer: Medicare PPO | Attending: Oncology | Admitting: Medical Oncology

## 2022-04-02 ENCOUNTER — Telehealth: Payer: Medicare PPO | Admitting: Oncology

## 2022-04-02 ENCOUNTER — Encounter: Payer: Self-pay | Admitting: Medical Oncology

## 2022-04-02 DIAGNOSIS — Z79811 Long term (current) use of aromatase inhibitors: Secondary | ICD-10-CM | POA: Insufficient documentation

## 2022-04-02 DIAGNOSIS — C50111 Malignant neoplasm of central portion of right female breast: Secondary | ICD-10-CM | POA: Insufficient documentation

## 2022-04-02 DIAGNOSIS — Z170 Estrogen receptor positive status [ER+]: Secondary | ICD-10-CM | POA: Insufficient documentation

## 2022-04-02 NOTE — Progress Notes (Signed)
Virtual Visit Progress Note  Ms. Nichole, Martinez are scheduled for a virtual visit with your provider today.    Just as we do with appointments in the office, we must obtain your consent to participate.  Your consent will be active for this visit and any virtual visit you may have with one of our providers in the next 365 days.    If you have a MyChart account, I can also send a copy of this consent to you electronically.  All virtual visits are billed to your insurance company just like a traditional visit in the office.  As this is a virtual visit, video technology does not allow for your provider to perform a traditional examination.  This may limit your provider's ability to fully assess your condition.  If your provider identifies any concerns that need to be evaluated in person or the need to arrange testing such as labs, EKG, etc, we will make arrangements to do so.    Although advances in technology are sophisticated, we cannot ensure that it will always work on either your end or our end.  If the connection with a video visit is poor, we may have to switch to a telephone visit.  With either a video or telephone visit, we are not always able to ensure that we have a secure connection.   I need to obtain your verbal consent now.   Are you willing to proceed with your visit today?   Nichole Martinez has provided verbal consent on 04/02/2022 for a virtual visit (video or telephone).   Nichole Closs, PA-C 04/02/2022  4:20 PM    I connected with Nichole Martinez on 04/02/22 at  2:30 PM EDT by video enabled telemedicine visit and verified that I am speaking with the correct person using two identifiers.   I discussed the limitations, risks, security and privacy concerns of performing an evaluation and management service by telemedicine and the availability of in-person appointments. I also discussed with the patient that there may be a patient responsible charge related to this service. The  patient expressed understanding and agreed to proceed.   Other persons participating in the visit and their role in the encounter: None  Patient's location: Home Provider's location: Clinic   Chief Complaint: Breast cancer follow up. Taking multivitamin, calcium and vitamin D. Due to DEXA and MR. Taking letrazole and tolerating it well. Weight stable. No night sweats.   Breast cancer follow up:     Patient Care Team: [name], PA-C as PCP - General (Physician Assistant) Rico Junker, RN as Registered Nurse Marlyn Corporal Clearnce Sorrel, PA-C as Physician Assistant (Family Medicine) Lloyd Huger, MD as Consulting Physician (Oncology) Jovita Kussmaul, MD as Consulting Physician (General Surgery)   Name of the patient: Nichole Martinez  [id number]  1951-03-21   Date of visit: 04/02/22  History of Presenting Illness  CHIEF COMPLAINT: Clinical stage IIa ER/PR positive, HER-2 negative invasive carcinoma of the central portion of the right breast.  INTERVAL HISTORY: Patient returns to clinic today for routine 22-monthevaluation. She states that she is feeling well.  She is tolerating letrozole without significant side effects.  She has no neurologic complaints. She denies any recent fevers or illnesses. She has a good appetite and denies weight loss.  She has no chest pain, shortness of breath, cough, or hemoptysis. She denies any nausea, vomiting, constipation, or diarrhea.  She has no urinary complaints. She has no further joint pain.  Patient  offers no specific complaints today.    Review of systems- ROS   Allergies  Allergen Reactions   Morphine Anaphylaxis    Anaphallaxis.    Past Medical History:  Diagnosis Date   Allergy    Breast cancer, right ([place]) 2020   Currently taking hormonal chemo tx's.    Depression    Diabetes mellitus without complication ([place]) 0623   Dyspnea    due to weight   Hyperlipidemia    Hypertension    Osteoporosis    Trigeminal neuralgia      Past Surgical History:  Procedure Laterality Date   ABDOMINAL HYSTERECTOMY  1978   BREAST BIOPSY Right 01/06/2019   [place] bx x 2.  Ribbon marker for mass and hydro for Lymph node. Path pending   BREAST CYST EXCISION Right 1986   BREAST EXCISIONAL BIOPSY     IR IMAGING GUIDED PORT INSERTION  05/12/2019   LAMINECTOMY  1995   L1, L2, L3   MASTECTOMY WITH AXILLARY LYMPH NODE DISSECTION Bilateral 11/17/2019   Procedure: BILATERAL MASTECTOMIES WITH RIGHT TARGETED SEED NODE DISSECTION;  Surgeon: Jovita Kussmaul, MD;  Location: [facility];  Service: General;  Laterality: Bilateral;   PORT-A-CATH REMOVAL Left 02/25/2020   Procedure: REMOVAL PORT-A-CATH;  Surgeon: Jovita Kussmaul, MD;  Location: [facility];  Service: General;  Laterality: Left;   REPLACEMENT TOTAL KNEE Left 04/28/2020   REPLACEMENT TOTAL KNEE Right 06/21/2020   SENTINEL NODE BIOPSY Right 11/17/2019   Procedure: RIGHT SENTINEL NODE BIOPSY;  Surgeon: Jovita Kussmaul, MD;  Location: [facility];  Service: General;  Laterality: Right;   SPINE SURGERY      Social History   Socioeconomic History   Marital status: Divorced    Spouse name: Not on file   Number of children: 1   Years of education: Not on file   Highest education level: Not on file  Occupational History   Occupation: RN    Employer: [employer]  Tobacco Use   Smoking status: Former    Types: Cigarettes    Quit date: 03/16/2000    Years since quitting: 22.0   Smokeless tobacco: Never  Vaping Use   Vaping Use: Never used  Substance and Sexual Activity   Alcohol use: No   Drug use: No   Sexual activity: Not on file  Other Topics Concern   Not on file  Social History Narrative   Not on file   Social Determinants of Health   Financial Resource Strain: Low Risk  (08/08/2021)   Overall Financial Resource Strain (CARDIA)    Difficulty of Paying Living Expenses: Not hard at all  Food Insecurity: No Food Insecurity  (08/08/2021)   Hunger Vital Sign    Worried About Running Out of Food in the Last Year: Never true    [place] in the Last Year: Never true  Transportation Needs: No Transportation Needs (08/08/2021)   PRAPARE - Hydrologist (Medical): No    Lack of Transportation (Non-Medical): No  Physical Activity: Insufficiently Active (08/08/2021)   Exercise Vital Sign    Days of Exercise per Week: 7 days    Minutes of Exercise per Session: 20 min  Stress: No Stress Concern Present (08/08/2021)   [place]    Feeling of Stress : Only a little  Social Connections: Socially Isolated (08/08/2021)   Social Connection and Isolation Panel [NHANES]  Frequency of Communication with Friends and Family: More than three times a week    Frequency of Social Gatherings with Friends and Family: More than three times a week    Attends Religious Services: Never    Marine scientist or Organizations: No    Attends Archivist Meetings: Never    Marital Status: Divorced  Human resources officer Violence: Not At Risk (08/08/2021)   Humiliation, Afraid, Rape, and Kick questionnaire    Fear of Current or Ex-Partner: No    Emotionally Abused: No    Physically Abused: No    Sexually Abused: No    Immunization History  Administered Date(s) Administered   Fluad Quad(high Dose 65+) 06/04/2021   Influenza,inj,Quad PF,6+ Mos 06/17/2015   Influenza-Unspecified 06/17/2015, 06/16/2020   MODERNA COVID-19 SARS-COV-2 PEDS BIVALENT BOOSTER 6Y-11Y 02/15/2021, 06/04/2021   Moderna Sars-Covid-2 Vaccination 12/15/2019, 01/12/2020, 08/15/2020   PNEUMOCOCCAL CONJUGATE-20 06/04/2021   Pneumococcal-Unspecified 08/16/2020   Tdap 04/16/2020   Zoster Recombinat (Shingrix) 08/16/2020, 10/17/2020, 12/12/2020    Family History  Adopted: Yes  Family history unknown: Yes     Current Outpatient Medications:     alendronate (FOSAMAX) 70 MG tablet, TAKE ONE TABLET BY MOUTH ONCE A WEEK ON AN EMPTY STOMACH  WITH A FULL GLASS OF WATER, Disp: 12 tablet, Rfl: 2   amLODipine (NORVASC) 5 MG tablet, Take 1 tablet (5 mg total) by mouth daily., Disp: 90 tablet, Rfl: 1   baclofen (LIORESAL) 10 MG tablet, Take 1 tablet (10 mg total) by mouth daily. (Patient taking differently: Take 10 mg by mouth as needed for muscle spasms.), Disp: 90 tablet, Rfl: 1   Cholecalciferol (VITAMIN D3) 5000 units CAPS, Take by mouth., Disp: , Rfl:    fluticasone (FLONASE) 50 MCG/ACT nasal spray, Place 2 sprays into both nostrils daily., Disp: 16 g, Rfl: 6   letrozole (FEMARA) 2.5 MG tablet, Take 1 tablet (2.5 mg total) by mouth daily., Disp: 90 tablet, Rfl: 0   lisinopril-hydrochlorothiazide (ZESTORETIC) 10-12.5 MG tablet, Take 1 tablet by mouth daily., Disp: 90 tablet, Rfl: 1   loratadine (CLARITIN) 10 MG tablet, Take 1 tablet (10 mg total) by mouth daily as needed., Disp: 90 tablet, Rfl: 1   metFORMIN (GLUCOPHAGE) 1000 MG tablet, Take 1 tablet (1,000 mg total) by mouth 2 (two) times daily with a meal., Disp: 180 tablet, Rfl: 1   MULTIPLE VITAMINS PO, MULTIVITAMINS (Oral Tablet)  1 Every Day for 0 days  Quantity: 0.00;  Refills: 0   Ordered :17-Apr-2011  [name] ;  Started 24-February-2009 Active, Disp: , Rfl:    OMEGA 3-6-9 FATTY ACIDS PO, Reported on 10/04/2015, Disp: , Rfl:    simvastatin (ZOCOR) 20 MG tablet, Take 1 tablet (20 mg total) by mouth at bedtime., Disp: 90 tablet, Rfl: 1   Vitamin D, Ergocalciferol, (DRISDOL) 1.25 MG (50000 UNIT) CAPS capsule, Take 1 capsule by mouth once a week, Disp: 12 capsule, Rfl: 1   vitamin E 1000 UNIT capsule, Take 1,000 Units by mouth daily., Disp: , Rfl:  No current facility-administered medications for this visit.  Facility-Administered Medications Ordered in Other Visits:    heparin lock flush 100 unit/mL, 500 Units, Intravenous, Once, [place], Kathlene November, MD   sodium chloride flush (NS) 0.9 %  injection 10 mL, 10 mL, Intravenous, Once, [place] Ormond, Kathlene November, MD  Physical exam: Exam limited due to telemedicine Physical Exam    Assessment and plan- Patient is a 71 y.o. female    Visit Diagnosis 1. Malignant neoplasm of  central portion of right breast in female, estrogen receptor positive ([place])     Patient expressed understanding and was in agreement with this plan. She also understands that She can call clinic at any time with any questions, concerns, or complaints.   I discussed the assessment and treatment plan with the patient. The patient was provided an opportunity to ask questions and all were answered. The patient agreed with the plan and demonstrated an understanding of the instructions.   The patient was advised to call back or seek an in-person evaluation if the symptoms worsen or if the condition fails to improve as anticipated.   I spent 15 minutes on this telephone encounter.  Video connection was lost at >50% of the duration of the visit, at which time the remainder of the visit was completed via audio only.  Thank you for allowing me to participate in the care of this very pleasant patient.   [place] Virtual Visits On Demand  CC:

## 2022-04-17 ENCOUNTER — Other Ambulatory Visit: Payer: Self-pay | Admitting: *Deleted

## 2022-04-17 DIAGNOSIS — Z170 Estrogen receptor positive status [ER+]: Secondary | ICD-10-CM

## 2022-04-17 MED ORDER — LETROZOLE 2.5 MG PO TABS: 2.5000 mg | ORAL_TABLET | Freq: Every day | ORAL | 1 refills | Status: DC

## 2022-07-29 NOTE — Progress Notes (Deleted)
I,Sha'taria Lorren Splawn,acting as a Education administrator for [employer], PA-C.,have documented all relevant documentation on the behalf of Nichole Kirschner, PA-C,as directed by  Nichole Kirschner, PA-C while in the presence of Nichole Kirschner, PA-C.   Complete physical exam   Patient: Nichole Martinez   DOB: [birth year]   71 y.o. Female  MRN: [id number] Visit Date: 07/30/2022  Today's healthcare provider: [name], PA-C   No chief complaint on file.  Subjective    Nichole Martinez is a 71 y.o. female who presents today for a complete physical exam.  She reports consuming a {diet types:17450} diet. {Exercise:19826} She generally feels {well/fairly well/poorly:18703}. She reports sleeping {well/fairly well/poorly:18703}. She {does/does not:200015} have additional problems to discuss today.  HPI  -Last AWV 09/19/21  Past Medical History:  Diagnosis Date   Allergy    Breast cancer, right ([place]) 2020   Currently taking hormonal chemo tx's.    Depression    Diabetes mellitus without complication ([place]) 4166   Dyspnea    due to weight   Hyperlipidemia    Hypertension    Osteoporosis    Trigeminal neuralgia    Past Surgical History:  Procedure Laterality Date   ABDOMINAL HYSTERECTOMY  1978   BREAST BIOPSY Right 01/06/2019   [place] bx x 2.  Ribbon marker for mass and hydro for Lymph node. Path pending   BREAST CYST EXCISION Right 1986   BREAST EXCISIONAL BIOPSY     IR IMAGING GUIDED PORT INSERTION  05/12/2019   LAMINECTOMY  1995   L1, L2, L3   MASTECTOMY WITH AXILLARY LYMPH NODE DISSECTION Bilateral 11/17/2019   Procedure: BILATERAL MASTECTOMIES WITH RIGHT TARGETED SEED NODE DISSECTION;  Surgeon: Jovita Kussmaul, MD;  Location: [facility];  Service: General;  Laterality: Bilateral;   PORT-A-CATH REMOVAL Left 02/25/2020   Procedure: REMOVAL PORT-A-CATH;  Surgeon: Jovita Kussmaul, MD;  Location: [facility];  Service: General;  Laterality: Left;   REPLACEMENT TOTAL KNEE Left  04/28/2020   REPLACEMENT TOTAL KNEE Right 06/21/2020   SENTINEL NODE BIOPSY Right 11/17/2019   Procedure: RIGHT SENTINEL NODE BIOPSY;  Surgeon: Jovita Kussmaul, MD;  Location: [facility];  Service: General;  Laterality: Right;   SPINE SURGERY     Social History   Socioeconomic History   Marital status: Divorced    Spouse name: Not on file   Number of children: 1   Years of education: Not on file   Highest education level: Not on file  Occupational History   Occupation: RN    Employer: [employer]  Tobacco Use   Smoking status: Former    Types: Cigarettes    Quit date: 03/16/2000    Years since quitting: 22.3   Smokeless tobacco: Never  Vaping Use   Vaping Use: Never used  Substance and Sexual Activity   Alcohol use: No   Drug use: No   Sexual activity: Not on file  Other Topics Concern   Not on file  Social History Narrative   Not on file   Social Determinants of Health   Financial Resource Strain: Low Risk  (08/08/2021)   Overall Financial Resource Strain (CARDIA)    Difficulty of Paying Living Expenses: Not hard at all  Food Insecurity: No Food Insecurity (08/08/2021)   Hunger Vital Sign    Worried About Running Out of Food in the Last Year: Never true    [place] in the Last Year: Never true  Transportation Needs: No Transportation Needs (08/08/2021)   PRAPARE - Hydrologist (Medical): No    Lack of Transportation (Non-Medical): No  Physical Activity: Insufficiently Active (08/08/2021)   Exercise Vital Sign    Days of Exercise per Week: 7 days    Minutes of Exercise per Session: 20 min  Stress: No Stress Concern Present (08/08/2021)   [place]    Feeling of Stress : Only a little  Social Connections: Socially Isolated (08/08/2021)   Social Connection and Isolation Panel [NHANES]    Frequency of Communication with Friends and Family: More than  three times a week    Frequency of Social Gatherings with Friends and Family: More than three times a week    Attends Religious Services: Never    Marine scientist or Organizations: No    Attends Archivist Meetings: Never    Marital Status: Divorced  Human resources officer Violence: Not At Risk (08/08/2021)   Humiliation, Afraid, Rape, and Kick questionnaire    Fear of Current or Ex-Partner: No    Emotionally Abused: No    Physically Abused: No    Sexually Abused: No   No family status information on file.   Family History  Adopted: Yes  Family history unknown: Yes   Allergies  Allergen Reactions   Morphine Anaphylaxis    Anaphallaxis.    Patient Care Team: Nichole Kirschner, PA-C as PCP - General (Physician Assistant) Rico Junker, RN as Registered Nurse Marlyn Corporal Clearnce Sorrel, PA-C as Physician Assistant (Family Medicine) Lloyd Huger, MD as Consulting Physician (Oncology) Jovita Kussmaul, MD as Consulting Physician (General Surgery)   Medications: Outpatient Medications Prior to Visit  Medication Sig   alendronate (FOSAMAX) 70 MG tablet TAKE ONE TABLET BY MOUTH ONCE A WEEK ON AN EMPTY STOMACH  WITH A FULL GLASS OF WATER   amLODipine (NORVASC) 5 MG tablet Take 1 tablet (5 mg total) by mouth daily.   baclofen (LIORESAL) 10 MG tablet Take 1 tablet (10 mg total) by mouth daily. (Patient taking differently: Take 10 mg by mouth as needed for muscle spasms.)   Cholecalciferol (VITAMIN D3) 5000 units CAPS Take by mouth.   fluticasone (FLONASE) 50 MCG/ACT nasal spray Place 2 sprays into both nostrils daily.   letrozole (FEMARA) 2.5 MG tablet Take 1 tablet (2.5 mg total) by mouth daily.   lisinopril-hydrochlorothiazide (ZESTORETIC) 10-12.5 MG tablet Take 1 tablet by mouth daily.   loratadine (CLARITIN) 10 MG tablet Take 1 tablet (10 mg total) by mouth daily as needed.   metFORMIN (GLUCOPHAGE) 1000 MG tablet Take 1 tablet (1,000 mg total) by mouth 2 (two) times  daily with a meal.   MULTIPLE VITAMINS PO MULTIVITAMINS (Oral Tablet)  1 Every Day for 0 days  Quantity: 0.00;  Refills: 0   Ordered :17-Apr-2011  [name] ;  Started 24-February-2009 Active   OMEGA 3-6-9 FATTY ACIDS PO Reported on 10/04/2015   simvastatin (ZOCOR) 20 MG tablet Take 1 tablet (20 mg total) by mouth at bedtime.   Vitamin D, Ergocalciferol, (DRISDOL) 1.25 MG (50000 UNIT) CAPS capsule Take 1 capsule by mouth once a week   vitamin E 1000 UNIT capsule Take 1,000 Units by mouth daily.   Facility-Administered Medications Prior to Visit  Medication Dose Route Frequency Provider   heparin lock flush 100 unit/mL  500 Units Intravenous Once Lloyd Huger, MD   sodium chloride flush (NS) 0.9 % injection  10 mL  10 mL Intravenous Once Lloyd Huger, MD    Review of Systems  {Labs  Heme  Chem  Endocrine  Serology  Results Review (optional):23779}  Objective    There were no vitals taken for this visit. {Show previous vital signs (optional):23777}   Physical Exam  ***  Last depression screening scores    08/08/2021    1:25 PM 08/04/2020    1:17 PM 08/04/2019   11:08 AM  PHQ 2/9 Scores  PHQ - 2 Score 0 0 0  PHQ- 9 Score  2    Last fall risk screening    08/08/2021    1:30 PM  [place] in the past year? 0  Number falls in past yr: 0  Injury with Fall? 0  Risk for fall due to : No Fall Risks  Follow up Falls prevention discussed   Last Audit-C alcohol use screening    08/08/2021    1:23 PM  Alcohol Use Disorder Test (AUDIT)  1. How often do you have a drink containing alcohol? 0  2. How many drinks containing alcohol do you have on a typical day when you are drinking? 0  3. How often do you have six or more drinks on one occasion? 0  AUDIT-C Score 0   A score of 3 or more in women, and 4 or more in men indicates increased risk for alcohol abuse, EXCEPT if all of the points are from question 1   No results found for any visits on 07/30/22.   Assessment & Plan    Routine Health Maintenance and Physical Exam  Exercise Activities and Dietary recommendations  Goals      Weight (lb) < 200 lb (90.7 kg)     Like to lose 10lb        Immunization History  Administered Date(s) Administered   Fluad Quad(high Dose 65+) 06/04/2021   Influenza,inj,Quad PF,6+ Mos 06/17/2015   Influenza-Unspecified 06/17/2015, 06/16/2020   MODERNA COVID-19 SARS-COV-2 PEDS BIVALENT BOOSTER 6Y-11Y 02/15/2021, 06/04/2021   Moderna Sars-Covid-2 Vaccination 12/15/2019, 01/12/2020, 08/15/2020   PNEUMOCOCCAL CONJUGATE-20 06/04/2021   Pneumococcal-Unspecified 08/16/2020   Tdap 04/16/2020   Zoster Recombinat (Shingrix) 08/16/2020, 10/17/2020, 12/12/2020    Health Maintenance  Topic Date Due   Diabetic kidney evaluation - Urine ACR  Never done   OPHTHALMOLOGY EXAM  03/07/2017   COVID-19 Vaccine (6 - Moderna risk series) 07/30/2021   INFLUENZA VACCINE  04/16/2022   Medicare Annual Wellness (AWV)  08/08/2022   Diabetic kidney evaluation - GFR measurement  09/20/2022   HEMOGLOBIN A1C  09/26/2022   FOOT EXAM  03/27/2023   Fecal DNA (Cologuard)  10/27/2023   TETANUS/TDAP  04/16/2030   Pneumonia Vaccine 96+ Years old  Completed   DEXA SCAN  Completed   Hepatitis C Screening  Completed   Zoster Vaccines- Shingrix  Completed   HPV VACCINES  Aged Out   MAMMOGRAM  Discontinued    Discussed health benefits of physical activity, and encouraged her to engage in regular exercise appropriate for her age and condition.  ***  No follow-ups on file.     {provider attestation***:1}   Nichole Kirschner, PA-C  [facility] [phone] (phone) [phone] (fax)  [place]

## 2022-07-30 ENCOUNTER — Ambulatory Visit: Payer: Medicare PPO | Admitting: Physician Assistant

## 2022-08-14 NOTE — Progress Notes (Unsigned)
Established patient visit   Patient: Nichole Martinez   DOB: 08/18/1951   71 y.o. Female  MRN: [id number] Visit Date: 08/15/2022  Today's healthcare provider: [name], PA-C I,Lindsay Drubel,acting as a scribe for [name], PA-C.,have documented all relevant documentation on the behalf of [name], PA-C,as directed by  [name], PA-C while in the presence of [name], PA-C.    Cc. Htn, hld, dm f/u  Subjective    Nichole Martinez, 71 y.o. female, here for chronic care follow-up. No concerns or side effects from medication.    Diabetes Mellitus Type II, follow-up  Lab Results  Component Value Date   HGBA1C 5.6 03/26/2022   HGBA1C 5.8 (H) 09/20/2021   HGBA1C 5.7 (H) 08/04/2020    Home blood sugar records: high 80s fasting, 120-130s after eating  Episodes of hypoglycemia? no   Current insulin regiment: none  Most Recent Eye Exam: reports last spring;   --------------------------------------------------------------------------------------------------- Hypertension, follow-up  BP Readings from Last 3 Encounters:  08/15/22 124/72  03/26/22 (!) 129/93  12/26/21 127/80   Wt Readings from Last 3 Encounters:  08/15/22 176 lb 12.8 oz (80.2 kg)  03/26/22 180 lb (81.6 kg)  12/26/21 184 lb 4.8 oz (83.6 kg)     Outside blood pressures are 124/72. Reports always lower at home.   She does not smoke.  --------------------------------------------------------------------------------------------------- Lipid/Cholesterol, follow-up  Last Lipid Panel: Lab Results  Component Value Date   CHOL 183 09/20/2021   LDLCALC 95 09/20/2021   HDL 69 09/20/2021   TRIG 106 41/96/2229    Last metabolic panel Lab Results  Component Value Date   GLUCOSE 85 09/20/2021   NA 140 09/20/2021   K 4.8 09/20/2021   BUN 16 09/20/2021   CREATININE 0.75 09/20/2021   EGFR 87 09/20/2021   GFRNONAA 82 08/04/2020   CALCIUM 10.0 09/20/2021   AST 17 09/20/2021    ALT 12 09/20/2021   The 10-year ASCVD risk score (Arnett DK, et al., 2019) is: 16.2%  ---------------------------------------------------------------------------------------------------  Medications: Outpatient Medications Prior to Visit  Medication Sig   alendronate (FOSAMAX) 70 MG tablet TAKE ONE TABLET BY MOUTH ONCE A WEEK ON AN EMPTY STOMACH  WITH A FULL GLASS OF WATER   amLODipine (NORVASC) 5 MG tablet Take 1 tablet (5 mg total) by mouth daily.   baclofen (LIORESAL) 10 MG tablet Take 1 tablet (10 mg total) by mouth daily. (Patient taking differently: Take 10 mg by mouth as needed for muscle spasms.)   Cholecalciferol (VITAMIN D3) 5000 units CAPS Take by mouth.   fluticasone (FLONASE) 50 MCG/ACT nasal spray Place 2 sprays into both nostrils daily.   letrozole (FEMARA) 2.5 MG tablet Take 1 tablet (2.5 mg total) by mouth daily.   lisinopril-hydrochlorothiazide (ZESTORETIC) 10-12.5 MG tablet Take 1 tablet by mouth daily.   loratadine (CLARITIN) 10 MG tablet Take 1 tablet (10 mg total) by mouth daily as needed.   metFORMIN (GLUCOPHAGE) 1000 MG tablet Take 1 tablet (1,000 mg total) by mouth 2 (two) times daily with a meal.   MULTIPLE VITAMINS PO MULTIVITAMINS (Oral Tablet)  1 Every Day for 0 days  Quantity: 0.00;  Refills: 0   Ordered :17-Apr-2011  [name] ;  Started 24-February-2009 Active   OMEGA 3-6-9 FATTY ACIDS PO Reported on 10/04/2015   simvastatin (ZOCOR) 20 MG tablet Take 1 tablet (20 mg total) by mouth at bedtime.   Vitamin D, Ergocalciferol, (DRISDOL) 1.25 MG (50000 UNIT) CAPS capsule Take 1 capsule  by mouth once a week   vitamin E 1000 UNIT capsule Take 1,000 Units by mouth daily.   Facility-Administered Medications Prior to Visit  Medication Dose Route Frequency Provider   heparin lock flush 100 unit/mL  500 Units Intravenous Once Lloyd Huger, MD   sodium chloride flush (NS) 0.9 % injection 10 mL  10 mL Intravenous Once Lloyd Huger, MD    Review of Systems   Constitutional:  Negative for fatigue and fever.  Respiratory:  Negative for cough and shortness of breath.   Cardiovascular:  Negative for chest pain and leg swelling.  Gastrointestinal:  Negative for abdominal pain.  Neurological:  Negative for dizziness and headaches.      Objective    Blood pressure 124/72, pulse 68, temperature 97.9 F (36.6 C), temperature source Oral, weight 176 lb 12.8 oz (80.2 kg), SpO2 99 %.   Physical Exam Constitutional:      General: She is awake.     Appearance: She is well-developed.  HENT:     Head: Normocephalic.  Eyes:     Conjunctiva/sclera: Conjunctivae normal.  Cardiovascular:     Rate and Rhythm: Normal rate and regular rhythm.     Heart sounds: Normal heart sounds.  Pulmonary:     Effort: Pulmonary effort is normal.     Breath sounds: Normal breath sounds.  Musculoskeletal:     Right lower leg: No edema.     Left lower leg: No edema.  Skin:    General: Skin is warm.  Neurological:     Mental Status: She is alert and oriented to person, place, and time.  Psychiatric:        Attention and Perception: Attention normal.        Mood and Affect: Mood normal.        Speech: Speech normal.        Behavior: Behavior is cooperative.      No results found for any visits on 08/15/22.  Assessment & Plan     Problem List Items Addressed This Visit       Cardiovascular and Mediastinum   Hypertension associated with diabetes ([place])    Chronic,elevated in office but appropriate range at home. Managed with amlodipine 5 mg , lisinopril 10 mg hctz 12.5, continue as prescribed Ordered cmp F/u 6 mo       Relevant Orders   Comprehensive Metabolic Panel (CMET)     Endocrine   Diabetes mellitus, type 2 ([facility]) - Primary    A1c ordered today, previously 5.6% Managed with metformin 1000 mg bid Foot exam UTD  Uacr ordered On statin , ace/arb Optho utd per pt F/u 6 mo       Relevant Orders   Urine Microalbumin w/creat. ratio   HgB  A1c   Hyperlipidemia associated with type 2 diabetes mellitus ([place])    Managed with simvastatin 20 mg Ldl goal < 70 Repeat fasting lipids today       Relevant Orders   Lipid Profile     Return in about 6 months (around 02/13/2023) for CPE, AVW.     I, [name], PA-C have reviewed all documentation for this visit. The documentation on  08/15/2022  for the exam, diagnosis, procedures, and orders are all accurate and complete.  [name], PA-C [facility] [address] [place], [place], [address] Office: [phone] Fax: [place]

## 2022-08-15 ENCOUNTER — Encounter: Payer: Self-pay | Admitting: Physician Assistant

## 2022-08-15 ENCOUNTER — Ambulatory Visit: Payer: Medicare PPO | Admitting: Physician Assistant

## 2022-08-15 ENCOUNTER — Ambulatory Visit (INDEPENDENT_AMBULATORY_CARE_PROVIDER_SITE_OTHER): Payer: Medicare PPO

## 2022-08-15 VITALS — BP 130/96 | Ht 64.5 in | Wt 176.0 lb

## 2022-08-15 VITALS — BP 124/72 | HR 68 | Temp 97.9°F | Wt 176.8 lb

## 2022-08-15 DIAGNOSIS — E785 Hyperlipidemia, unspecified: Secondary | ICD-10-CM

## 2022-08-15 DIAGNOSIS — E1159 Type 2 diabetes mellitus with other circulatory complications: Secondary | ICD-10-CM

## 2022-08-15 DIAGNOSIS — Z0000 Encounter for general adult medical examination without abnormal findings: Secondary | ICD-10-CM | POA: Diagnosis not present

## 2022-08-15 DIAGNOSIS — E119 Type 2 diabetes mellitus without complications: Secondary | ICD-10-CM

## 2022-08-15 DIAGNOSIS — I152 Hypertension secondary to endocrine disorders: Secondary | ICD-10-CM | POA: Diagnosis not present

## 2022-08-15 DIAGNOSIS — E1169 Type 2 diabetes mellitus with other specified complication: Secondary | ICD-10-CM

## 2022-08-15 NOTE — Patient Instructions (Signed)
Nichole Martinez , Thank you for taking time to come for your Medicare Wellness Visit. I appreciate your ongoing commitment to your health goals. Please review the following plan we discussed and let me know if I can assist you in the future.   Screening recommendations/referrals: Colonoscopy: Cologuard 11/02/20, every 3 years Mammogram: not needed Bone Density: 12/14/20, every 5 years Recommended yearly ophthalmology/optometry visit for glaucoma screening and checkup Recommended yearly dental visit for hygiene and checkup  Vaccinations: Influenza vaccine: 06/04/21 Pneumococcal vaccine: 06/04/21 Tdap vaccine: 04/16/20 Shingles vaccine: Shingrix 08/16/20, 10/17/20, 12/12/20   Covid-19:02/15/21, 06/04/21  Advanced directives: no  Conditions/risks identified: none  Next appointment: Follow up in one year for your annual wellness visit 08/18/23 @ 2:45 pm in person   Preventive Care 49 Years and Older, Female Preventive care refers to lifestyle choices and visits with your health care provider that can promote health and wellness. What does preventive care include? A yearly physical exam. This is also called an annual well check. Dental exams once or twice a year. Routine eye exams. Ask your health care provider how often you should have your eyes checked. Personal lifestyle choices, including: Daily care of your teeth and gums. Regular physical activity. Eating a healthy diet. Avoiding tobacco and drug use. Limiting alcohol use. Practicing safe sex. Taking low-dose aspirin every day. Taking vitamin and mineral supplements as recommended by your health care provider. What happens during an annual well check? The services and screenings done by your health care provider during your annual well check will depend on your age, overall health, lifestyle risk factors, and family history of disease. Counseling  Your health care provider may ask you questions about your: Alcohol use. Tobacco use. Drug  use. Emotional well-being. Home and relationship well-being. Sexual activity. Eating habits. History of falls. Memory and ability to understand (cognition). Work and work Statistician. Reproductive health. Screening  You may have the following tests or measurements: Height, weight, and BMI. Blood pressure. Lipid and cholesterol levels. These may be checked every 5 years, or more frequently if you are over 27 years old. Skin check. Lung cancer screening. You may have this screening every year starting at age 1 if you have a 30-pack-year history of smoking and currently smoke or have quit within the past 15 years. Fecal occult blood test (FOBT) of the stool. You may have this test every year starting at age 32. Flexible sigmoidoscopy or colonoscopy. You may have a sigmoidoscopy every 5 years or a colonoscopy every 10 years starting at age 62. Hepatitis C blood test. Hepatitis B blood test. Sexually transmitted disease (STD) testing. Diabetes screening. This is done by checking your blood sugar (glucose) after you have not eaten for a while (fasting). You may have this done every 1-3 years. Bone density scan. This is done to screen for osteoporosis. You may have this done starting at age 7. Mammogram. This may be done every 1-2 years. Talk to your health care provider about how often you should have regular mammograms. Talk with your health care provider about your test results, treatment options, and if necessary, the need for more tests. Vaccines  Your health care provider may recommend certain vaccines, such as: Influenza vaccine. This is recommended every year. Tetanus, diphtheria, and acellular pertussis (Tdap, Td) vaccine. You may need a Td booster every 10 years. Zoster vaccine. You may need this after age 76. Pneumococcal 13-valent conjugate (PCV13) vaccine. One dose is recommended after age 31. Pneumococcal polysaccharide (PPSV23) vaccine. One dose  is recommended after age  1. Talk to your health care provider about which screenings and vaccines you need and how often you need them. This information is not intended to replace advice given to you by your health care provider. Make sure you discuss any questions you have with your health care provider. Document Released: 09/29/2015 Document Revised: 05/22/2016 Document Reviewed: 07/04/2015 Elsevier Interactive Patient Education  2017 [place] Prevention in the Home Falls can cause injuries. They can happen to people of all ages. There are many things you can do to make your home safe and to help prevent falls. What can I do on the outside of my home? Regularly fix the edges of walkways and driveways and fix any cracks. Remove anything that might make you trip as you walk through a door, such as a raised step or threshold. Trim any bushes or trees on the path to your home. Use bright outdoor lighting. Clear any walking paths of anything that might make someone trip, such as rocks or tools. Regularly check to see if handrails are loose or broken. Make sure that both sides of any steps have handrails. Any raised decks and porches should have guardrails on the edges. Have any leaves, snow, or ice cleared regularly. Use sand or salt on walking paths during winter. Clean up any spills in your garage right away. This includes oil or grease spills. What can I do in the bathroom? Use night lights. Install grab bars by the toilet and in the tub and shower. Do not use towel bars as grab bars. Use non-skid mats or decals in the tub or shower. If you need to sit down in the shower, use a plastic, non-slip stool. Keep the floor dry. Clean up any water that spills on the floor as soon as it happens. Remove soap buildup in the tub or shower regularly. Attach bath mats securely with double-sided non-slip rug tape. Do not have throw rugs and other things on the floor that can make you trip. What can I do in the  bedroom? Use night lights. Make sure that you have a light by your bed that is easy to reach. Do not use any sheets or blankets that are too big for your bed. They should not hang down onto the floor. Have a firm chair that has side arms. You can use this for support while you get dressed. Do not have throw rugs and other things on the floor that can make you trip. What can I do in the kitchen? Clean up any spills right away. Avoid walking on wet floors. Keep items that you use a lot in easy-to-reach places. If you need to reach something above you, use a strong step stool that has a grab bar. Keep electrical cords out of the way. Do not use floor polish or wax that makes floors slippery. If you must use wax, use non-skid floor wax. Do not have throw rugs and other things on the floor that can make you trip. What can I do with my stairs? Do not leave any items on the stairs. Make sure that there are handrails on both sides of the stairs and use them. Fix handrails that are broken or loose. Make sure that handrails are as long as the stairways. Check any carpeting to make sure that it is firmly attached to the stairs. Fix any carpet that is loose or worn. Avoid having throw rugs at the top or bottom of the stairs.  If you do have throw rugs, attach them to the floor with carpet tape. Make sure that you have a light switch at the top of the stairs and the bottom of the stairs. If you do not have them, ask someone to add them for you. What else can I do to help prevent falls? Wear shoes that: Do not have high heels. Have rubber bottoms. Are comfortable and fit you well. Are closed at the toe. Do not wear sandals. If you use a stepladder: Make sure that it is fully opened. Do not climb a closed stepladder. Make sure that both sides of the stepladder are locked into place. Ask someone to hold it for you, if possible. Clearly mark and make sure that you can see: Any grab bars or  handrails. First and last steps. Where the edge of each step is. Use tools that help you move around (mobility aids) if they are needed. These include: Canes. Walkers. Scooters. Crutches. Turn on the lights when you go into a dark area. Replace any light bulbs as soon as they burn out. Set up your furniture so you have a clear path. Avoid moving your furniture around. If any of your floors are uneven, fix them. If there are any pets around you, be aware of where they are. Review your medicines with your doctor. Some medicines can make you feel dizzy. This can increase your chance of falling. Ask your doctor what other things that you can do to help prevent falls. This information is not intended to replace advice given to you by your health care provider. Make sure you discuss any questions you have with your health care provider. Document Released: 06/29/2009 Document Revised: 02/08/2016 Document Reviewed: 10/07/2014 Elsevier Interactive Patient Education  2017 [employer].

## 2022-08-15 NOTE — Assessment & Plan Note (Addendum)
Chronic,elevated in office but appropriate range at home. Managed with amlodipine 5 mg , lisinopril 10 mg hctz 12.5, continue as prescribed Ordered cmp F/u 6 mo

## 2022-08-15 NOTE — Progress Notes (Signed)
Subjective:   Nichole Martinez is a 71 y.o. female who presents for Medicare Annual (Subsequent) preventive examination.  Review of Systems     Cardiac Risk Factors include: advanced age (>43mn, >>32women);diabetes mellitus;hypertension     Objective:    Today's Vitals   08/15/22 1033  BP: (!) 130/96  Weight: 176 lb (79.8 kg)  Height: 5' 4.5" (1.638 m)   Body mass index is 29.74 kg/m.     08/15/2022   10:42 AM 08/08/2021    1:28 PM 04/05/2021    2:25 PM 12/30/2019    1:15 PM 12/11/2019    5:04 PM 12/01/2019    2:53 PM 11/17/2019   11:16 AM  Advanced Directives  Does Patient Have a Medical Advance Directive? No Yes No Yes Yes Yes Yes  Type of ASocial research officer, governmentLiving will Out of facility DNR (pink MOST or yellow form)  [place] will [place] will [place]Living will  Does patient want to make changes to medical advance directive?  No - Patient declined    No - Patient declined No - Patient declined  Copy of [place] Chart?  No - copy requested  No - copy requested No - copy requested No - copy requested No - copy requested  Would patient like information on creating a medical advance directive? No - Patient declined          Current Medications (verified) Outpatient Encounter Medications as of 08/15/2022  Medication Sig   alendronate (FOSAMAX) 70 MG tablet TAKE ONE TABLET BY MOUTH ONCE A WEEK ON AN EMPTY STOMACH  WITH A FULL GLASS OF WATER   amLODipine (NORVASC) 5 MG tablet Take 1 tablet (5 mg total) by mouth daily.   baclofen (LIORESAL) 10 MG tablet Take 1 tablet (10 mg total) by mouth daily. (Patient taking differently: Take 10 mg by mouth as needed for muscle spasms.)   Cholecalciferol (VITAMIN D3) 5000 units CAPS Take by mouth.   fluticasone (FLONASE) 50 MCG/ACT nasal spray Place 2 sprays into both nostrils daily.   letrozole (FEMARA) 2.5 MG  tablet Take 1 tablet (2.5 mg total) by mouth daily.   lisinopril-hydrochlorothiazide (ZESTORETIC) 10-12.5 MG tablet Take 1 tablet by mouth daily.   loratadine (CLARITIN) 10 MG tablet Take 1 tablet (10 mg total) by mouth daily as needed.   metFORMIN (GLUCOPHAGE) 1000 MG tablet Take 1 tablet (1,000 mg total) by mouth 2 (two) times daily with a meal.   MULTIPLE VITAMINS PO MULTIVITAMINS (Oral Tablet)  1 Every Day for 0 days  Quantity: 0.00;  Refills: 0   Ordered :17-Apr-2011  KDarlin Priestly;  Started 24-February-2009 Active   OMEGA 3-6-9 FATTY ACIDS PO Reported on 10/04/2015   simvastatin (ZOCOR) 20 MG tablet Take 1 tablet (20 mg total) by mouth at bedtime.   Vitamin D, Ergocalciferol, (DRISDOL) 1.25 MG (50000 UNIT) CAPS capsule Take 1 capsule by mouth once a week   vitamin E 1000 UNIT capsule Take 1,000 Units by mouth daily.   Facility-Administered Encounter Medications as of 08/15/2022  Medication   heparin lock flush 100 unit/mL   sodium chloride flush (NS) 0.9 % injection 10 mL    Allergies (verified) Morphine and Morphine and related   History: Past Medical History:  Diagnosis Date   Allergy    [place]ast cancer, right [place]   Currently taking hormonal chemo tx's.    Depression  Diabetes mellitus without complication ([place]) 0998   Dyspnea    due to weight   Hyperlipidemia    Hypertension    Osteoporosis    Trigeminal neuralgia    Past Surgical History:  Procedure Laterality Date   ABDOMINAL HYSTERECTOMY  1978   BREAST BIOPSY Right 01/06/2019   [place] bx x 2.  Ribbon marker for mass and hydro for Lymph node. Path pending   BREAST CYST EXCISION Right 1986   BREAST EXCISIONAL BIOPSY     IR IMAGING GUIDED PORT INSERTION  05/12/2019   LAMINECTOMY  1995   L1, L2, L3   MASTECTOMY WITH AXILLARY LYMPH NODE DISSECTION Bilateral 11/17/2019   Procedure: BILATERAL MASTECTOMIES WITH RIGHT TARGETED SEED NODE DISSECTION;  Surgeon: Jovita Kussmaul, MD;  Location: [facility];  Service:  General;  Laterality: Bilateral;   PORT-A-CATH REMOVAL Left 02/25/2020   Procedure: REMOVAL PORT-A-CATH;  Surgeon: Jovita Kussmaul, MD;  Location: [facility];  Service: General;  Laterality: Left;   REPLACEMENT TOTAL KNEE Left 04/28/2020   REPLACEMENT TOTAL KNEE Right 06/21/2020   SENTINEL NODE BIOPSY Right 11/17/2019   Procedure: RIGHT SENTINEL NODE BIOPSY;  Surgeon: Jovita Kussmaul, MD;  Location: [facility];  Service: General;  Laterality: Right;   SPINE SURGERY     Family History  Adopted: Yes  Family history unknown: Yes   Social History   Socioeconomic History   Marital status: Divorced    Spouse name: Not on file   Number of children: 1   Years of education: Not on file   Highest education level: Not on file  Occupational History   Occupation: Therapist, sports    Employer: [employer]  Tobacco Use   Smoking status: Former    Types: Cigarettes    Quit date: 03/16/2000    Years since quitting: 22.4   Smokeless tobacco: Never  Vaping Use   Vaping Use: Never used  Substance and Sexual Activity   Alcohol use: No   Drug use: No   Sexual activity: Not on file  Other Topics Concern   Not on file  Social History Narrative   Not on file   Social Determinants of Health   Financial Resource Strain: Low Risk  (08/15/2022)   Overall Financial Resource Strain (CARDIA)    Difficulty of Paying Living Expenses: Not hard at all  Food Insecurity: No Food Insecurity (08/15/2022)   Hunger Vital Sign    Worried About Estate manager/land agent of Food in the Last Year: Never true    [place] in the Last Year: Never true  Transportation Needs: No Transportation Needs (08/15/2022)   PRAPARE - Hydrologist (Medical): No    Lack of Transportation (Non-Medical): No  Physical Activity: Sufficiently Active (08/15/2022)   Exercise Vital Sign    Days of Exercise per Week: 5 days    Minutes of Exercise per Session: 30 min  Stress: No Stress Concern  Present (08/15/2022)   [place]    Feeling of Stress : Not at all  Social Connections: Socially Isolated (08/15/2022)   Social Connection and Isolation Panel [NHANES]    Frequency of Communication with Friends and Family: More than three times a week    Frequency of Social Gatherings with Friends and Family: More than three times a week    Attends Religious Services: Never    Marine scientist or Organizations: No  Attends Archivist Meetings: Never    Marital Status: Divorced    Tobacco Counseling Counseling given: Not Answered   Clinical Intake:  Pre-visit preparation completed: Yes  Pain : No/denies pain     Nutritional Risks: None Diabetes: Yes CBG done?: No Did pt. bring in CBG monitor from home?: No  How often do you need to have someone help you when you read instructions, pamphlets, or other written materials from your doctor or pharmacy?: 1 - Never  Diabetic?yes Nutrition Risk Assessment:  Has the patient had any N/V/D within the last 2 months?  No  Does the patient have any non-healing wounds?  No  Has the patient had any unintentional weight loss or weight gain?  No   Diabetes:  Is the patient diabetic?  Yes  If diabetic, was a CBG obtained today?  No  Did the patient bring in their glucometer from home?  No  How often do you monitor your CBG's? Three x/ week.   Financial Strains and Diabetes Management:  Are you having any financial strains with the device, your supplies or your medication? No .  Does the patient want to be seen by Chronic Care Management for management of their diabetes?  No  Would the patient like to be referred to a Nutritionist or for Diabetic Management?  No   Diabetic Exams:  Diabetic Eye Exam: Completed 2023. Marland Kitchen Pt has been advised about the importance in completing this exam.  Diabetic Foot Exam: Completed 03/26/22. Pt has been advised about  the importance in completing this exam.     Interpreter Needed?: No  Information entered by :: Kirke Shaggy, LPN   Activities of Daily Living    08/15/2022   10:43 AM 08/15/2022   10:00 AM  In your present state of health, do you have any difficulty performing the following activities:  Hearing? 0 0  Vision? 0 0  Difficulty concentrating or making decisions? 0 0  Walking or climbing stairs? 0 0  Dressing or bathing? 0 0  Doing errands, shopping? 0 0  Preparing Food and eating ? N   Using the Toilet? N   In the past six months, have you accidently leaked urine? N   Do you have problems with loss of bowel control? N   Managing your Medications? N   Managing your Finances? N   Housekeeping or managing your Housekeeping? N     Patient Care Team: [name], PA-C as PCP - General (Physician Assistant) Rico Junker, RN as Registered Nurse Marlyn Corporal Clearnce Sorrel, PA-C as Physician Assistant (Family Medicine) Lloyd Huger, MD as Consulting Physician (Oncology) Jovita Kussmaul, MD as Consulting Physician (General Surgery)  Indicate any recent Medical Services you may have received from other than [facility] providers in the past year (date may be approximate).     Assessment:   This is a routine wellness examination for Shakisha.  Hearing/Vision screen Hearing Screening - Comments:: No aids Vision Screening - Comments:: Wears glasses- [facility]   Dietary issues and exercise activities discussed: Current Exercise Habits: The patient has a physically strenuous job, but has no regular exercise apart from work.   Goals Addressed             This Visit's Progress    DIET - EAT MORE FRUITS AND VEGETABLES         Depression Screen    08/15/2022   10:38 AM 08/15/2022    9:57 AM 08/08/2021  1:25 PM 08/04/2020    1:17 PM 08/04/2019   11:08 AM 05/07/2018    9:06 AM 05/06/2017    9:01 AM  PHQ 2/9 Scores  PHQ - 2 Score 0 0 0 0 0 0 0  PHQ- 9 Score    2  0 0     Fall Risk    08/15/2022   10:42 AM 08/15/2022    9:56 AM 08/08/2021    1:30 PM 08/07/2021   12:44 PM 08/04/2020    1:17 PM  Fall Risk   Falls in the past year? 0 0 0 1 0  Number falls in past yr: 0 0 0 1 0  Injury with Fall? 0 0 0 0 0  Risk for fall due to : No Fall Risks  No Fall Risks  No Fall Risks  Follow up Falls prevention discussed;Falls evaluation completed  Falls prevention discussed  Falls evaluation completed    FALL RISK PREVENTION PERTAINING TO THE HOME:  Any stairs in or around the home? Yes  If so, are there any without handrails? No  Home free of loose throw rugs in walkways, pet beds, electrical cords, etc? Yes  Adequate lighting in your home to reduce risk of falls? Yes   ASSISTIVE DEVICES UTILIZED TO PREVENT FALLS:  Life alert? No  Use of a cane, walker or w/c? No  Grab bars in the bathroom? No  Shower chair or bench in shower? No  Elevated toilet seat or a handicapped toilet? No   TIMED UP AND GO:  Was the test performed? Yes .  Length of time to ambulate 10 feet: 4 sec.   Gait steady and fast without use of assistive device  Cognitive Function:        08/15/2022   10:55 AM 09/19/2021    9:48 AM  6CIT Screen  What Year? 0 points 0 points  What month? 0 points 0 points  What time? 0 points 0 points  Count back from 20 0 points 0 points  Months in reverse 0 points 0 points  Repeat phrase 0 points 2 points  Total Score 0 points 2 points    Immunizations Immunization History  Administered Date(s) Administered   Fluad Quad(high Dose 65+) 06/04/2021   Influenza,inj,Quad PF,6+ Mos 06/17/2015   Influenza-Unspecified 06/17/2015, 06/16/2020, 05/19/2022   MODERNA COVID-19 SARS-COV-2 PEDS BIVALENT BOOSTER 6Y-11Y 02/15/2021, 06/04/2021   Moderna Sars-Covid-2 Vaccination 12/15/2019, 01/12/2020, 08/15/2020   PNEUMOCOCCAL CONJUGATE-20 06/04/2021   Pneumococcal-Unspecified 08/16/2020   Tdap 04/16/2020   Zoster Recombinat (Shingrix) 08/16/2020,  10/17/2020, 12/12/2020    TDAP status: Up to date  Flu Vaccine status: Up to date  Pneumococcal vaccine status: Up to date  Covid-19 vaccine status: Completed vaccines  Qualifies for Shingles Vaccine? Yes   Zostavax completed No   Shingrix Completed?: Yes  Screening Tests Health Maintenance  Topic Date Due   Diabetic kidney evaluation - Urine ACR  Never done   OPHTHALMOLOGY EXAM  03/07/2017   COVID-19 Vaccine (6 - 2023-24 season) 05/17/2022   Diabetic kidney evaluation - GFR measurement  09/20/2022   HEMOGLOBIN A1C  09/26/2022   FOOT EXAM  03/27/2023   Medicare Annual Wellness (AWV)  08/16/2023   Fecal DNA (Cologuard)  10/27/2023   DTaP/Tdap/Td (2 - Td or Tdap) 04/16/2030   Pneumonia Vaccine 29+ Years old  Completed   INFLUENZA VACCINE  Completed   DEXA SCAN  Completed   Hepatitis C Screening  Completed   Zoster Vaccines- Shingrix  Completed  HPV VACCINES  Aged Out   MAMMOGRAM  Discontinued    Health Maintenance  Health Maintenance Due  Topic Date Due   Diabetic kidney evaluation - Urine ACR  Never done   OPHTHALMOLOGY EXAM  03/07/2017   COVID-19 Vaccine (6 - 2023-24 season) 05/17/2022    Colorectal cancer screening: Type of screening: Cologuard. Completed 11/02/20. Repeat every 3 years  Mammogram status: No longer required due to mastectomies.  Bone Density status: Completed 12/14/20. Results reflect: Bone density results: OSTEOPENIA. Repeat every 5 years.  Lung Cancer Screening: (Low Dose CT Chest recommended if Age 10-80 years, 30 pack-year currently smoking OR have quit w/in 15years.) does not qualify.   Additional Screening:  Hepatitis C Screening: does qualify; Completed 05/06/17  Vision Screening: Recommended annual ophthalmology exams for early detection of glaucoma and other disorders of the eye. Is the patient up to date with their annual eye exam?  Yes  Who is the provider or what is the name of the office in which the patient attends annual eye  exams? [facility] If pt is not established with a provider, would they like to be referred to a provider to establish care? No .   Dental Screening: Recommended annual dental exams for proper oral hygiene  Community Resource Referral / Chronic Care Management: CRR required this visit?  No   CCM required this visit?  No      Plan:     I have personally reviewed and noted the following in the patient's chart:   Medical and social history Use of alcohol, tobacco or illicit drugs  Current medications and supplements including opioid prescriptions. Patient is not currently taking opioid prescriptions. Functional ability and status Nutritional status Physical activity Advanced directives List of other physicians Hospitalizations, surgeries, and ER visits in previous 12 months Vitals Screenings to include cognitive, depression, and falls Referrals and appointments  In addition, I have reviewed and discussed with patient certain preventive protocols, quality metrics, and best practice recommendations. A written personalized care plan for preventive services as well as general preventive health recommendations were provided to patient.     Dionisio David, LPN   [id number]   Nurse Notes: none

## 2022-08-15 NOTE — Assessment & Plan Note (Addendum)
A1c ordered today, previously 5.6% Managed with metformin 1000 mg bid Foot exam UTD  Uacr ordered On statin , ace/arb Optho utd per pt F/u 6 mo

## 2022-08-15 NOTE — Assessment & Plan Note (Signed)
Managed with simvastatin 20 mg Ldl goal < 70 Repeat fasting lipids today

## 2022-08-17 LAB — COMPREHENSIVE METABOLIC PANEL
ALT: 15 IU/L (ref 0–32)
AST: 20 IU/L (ref 0–40)
Albumin/Globulin Ratio: 1.9 (ref 1.2–2.2)
Albumin: 4.4 g/dL (ref 3.9–4.9)
Alkaline Phosphatase: 72 IU/L (ref 44–121)
BUN/Creatinine Ratio: 21 (ref 12–28)
BUN: 19 mg/dL (ref 8–27)
Bilirubin Total: 0.4 mg/dL (ref 0.0–1.2)
CO2: 23 mmol/L (ref 20–29)
Calcium: 9.6 mg/dL (ref 8.7–10.3)
Chloride: 103 mmol/L (ref 96–106)
Creatinine, Ser: 0.91 mg/dL (ref 0.57–1.00)
Globulin, Total: 2.3 g/dL (ref 1.5–4.5)
Glucose: 97 mg/dL (ref 70–99)
Potassium: 5.1 mmol/L (ref 3.5–5.2)
Sodium: 140 mmol/L (ref 134–144)
Total Protein: 6.7 g/dL (ref 6.0–8.5)
eGFR: 69 mL/min/{1.73_m2} (ref >59)

## 2022-08-17 LAB — HEMOGLOBIN A1C
Est. average glucose Bld gHb Est-mCnc: 120 mg/dL
Hgb A1c MFr Bld: 5.8 % — ABNORMAL HIGH (ref 4.8–5.6)

## 2022-08-17 LAB — LIPID PANEL
Chol/HDL Ratio: 2.4 ratio (ref 0.0–4.4)
Cholesterol, Total: 181 mg/dL (ref 100–199)
HDL: 76 mg/dL (ref >39)
LDL Chol Calc (NIH): 91 mg/dL (ref 0–99)
Triglycerides: 78 mg/dL (ref 0–149)
VLDL Cholesterol Cal: 14 mg/dL (ref 5–40)

## 2022-08-17 LAB — MICROALBUMIN / CREATININE URINE RATIO
Creatinine, Urine: 91.9 mg/dL
Microalb/Creat Ratio: 4 mg/g creat (ref 0–29)
Microalbumin, Urine: 3.6 ug/mL

## 2022-10-03 ENCOUNTER — Telehealth: Payer: Medicare PPO | Admitting: Medical Oncology

## 2022-10-04 ENCOUNTER — Inpatient Hospital Stay: Payer: Medicare PPO | Attending: Oncology | Admitting: Medical Oncology

## 2022-10-04 ENCOUNTER — Encounter: Payer: Self-pay | Admitting: Medical Oncology

## 2022-10-04 DIAGNOSIS — M85851 Other specified disorders of bone density and structure, right thigh: Secondary | ICD-10-CM

## 2022-10-04 DIAGNOSIS — C50111 Malignant neoplasm of central portion of right female breast: Secondary | ICD-10-CM | POA: Diagnosis not present

## 2022-10-04 DIAGNOSIS — Z87891 Personal history of nicotine dependence: Secondary | ICD-10-CM | POA: Diagnosis not present

## 2022-10-04 DIAGNOSIS — M8580 Other specified disorders of bone density and structure, unspecified site: Secondary | ICD-10-CM | POA: Diagnosis not present

## 2022-10-04 DIAGNOSIS — Z170 Estrogen receptor positive status [ER+]: Secondary | ICD-10-CM | POA: Diagnosis not present

## 2022-10-04 DIAGNOSIS — Z79811 Long term (current) use of aromatase inhibitors: Secondary | ICD-10-CM | POA: Insufficient documentation

## 2022-10-04 NOTE — Progress Notes (Signed)
Virtual Visit Progress Note  Ms. delani, kohli are scheduled for a virtual visit with your provider today.    Just as we do with appointments in the office, we must obtain your consent to participate.  Your consent will be active for this visit and any virtual visit you may have with one of our providers in the next 365 days.    If you have a MyChart account, I can also send a copy of this consent to you electronically.  All virtual visits are billed to your insurance company just like a traditional visit in the office.  As this is a virtual visit, video technology does not allow for your provider to perform a traditional examination.  This may limit your provider's ability to fully assess your condition.  If your provider identifies any concerns that need to be evaluated in person or the need to arrange testing such as labs, EKG, etc, we will make arrangements to do so.    Although advances in technology are sophisticated, we cannot ensure that it will always work on either your end or our end.  If the connection with a video visit is poor, we may have to switch to a telephone visit.  With either a video or telephone visit, we are not always able to ensure that we have a secure connection.   I need to obtain your verbal consent now.   Are you willing to proceed with your visit today?   TAELAR GRONEWOLD has provided verbal consent on 10/04/2022 for a virtual visit (video or telephone).   Hughie Closs, PA-C 10/04/2022  4:13 PM    I connected with [name] on 10/04/22 at  3:00 PM EST by video enabled telemedicine visit and verified that I am speaking with the correct person using two identifiers.   I discussed the limitations, risks, security and privacy concerns of performing an evaluation and management service by telemedicine and the availability of in-person appointments. I also discussed with the patient that there may be a patient responsible charge related to this service. The  patient expressed understanding and agreed to proceed.   Other persons participating in the visit and their role in the encounter: None  Patient's location: Home Provider's location: Clinic   Chief Complaint: Breast cancer follow up. Taking multivitamin, calcium and vitamin D. Due to DEXA and MR. Taking letrazole and tolerating it well. Weight stable. No night sweats.   Breast cancer follow up:     Patient Care Team: [name], PA-C as PCP - General (Physician Assistant) Rico Junker, RN as Registered Nurse Marlyn Corporal Clearnce Sorrel, PA-C as Physician Assistant (Family Medicine) Lloyd Huger, MD as Consulting Physician (Oncology) Jovita Kussmaul, MD as Consulting Physician (General Surgery)   Name of the patient: Nichole Martinez  [id number]  Nov 15, 1950   Date of visit: 10/04/22  History of Presenting Illness  CHIEF COMPLAINT: Clinical stage IIa ER/PR positive, HER-2 negative invasive carcinoma of the central portion of the right breast.  INTERVAL HISTORY: Patient returns to clinic today for routine 41-monthevaluation.   She reports that she is doing well. Currently health described as great. She has an upcoming surgery planned at the end of the 2024 summer to help correct a muscle wall weakness between her vaginal volt and the rectum. This is not causing her distress at the moment. In terms of her breast cancer follow up, She denies any concerns. She is s/p bilateral mastectomy. She is tolerating letrozole without  significant side effects.  She has no neurologic complaints. She denies any recent fevers or illnesses. She has a good appetite and denies weight loss.  She has no chest pain, shortness of breath, cough, or hemoptysis. She denies any nausea, vomiting, constipation, or diarrhea.  She has no urinary complaints. She has no further joint pain.  Patient offers no specific complaints today.    Review of systems- ROS   Allergies  Allergen Reactions   Morphine  Anaphylaxis    Anaphallaxis.   Morphine And Related Anaphylaxis    Past Medical History:  Diagnosis Date   Allergy    Breast cancer, right ([place]) 2020   Currently taking hormonal chemo tx's.    Depression    Diabetes mellitus without complication ([place]) 7262   Dyspnea    due to weight   Hyperlipidemia    Hypertension    Osteoporosis    Trigeminal neuralgia     Past Surgical History:  Procedure Laterality Date   ABDOMINAL HYSTERECTOMY  1978   BREAST BIOPSY Right 01/06/2019   [place] bx x 2.  Ribbon marker for mass and hydro for Lymph node. Path pending   BREAST CYST EXCISION Right 1986   BREAST EXCISIONAL BIOPSY     IR IMAGING GUIDED PORT INSERTION  05/12/2019   LAMINECTOMY  1995   L1, L2, L3   MASTECTOMY WITH AXILLARY LYMPH NODE DISSECTION Bilateral 11/17/2019   Procedure: BILATERAL MASTECTOMIES WITH RIGHT TARGETED SEED NODE DISSECTION;  Surgeon: Jovita Kussmaul, MD;  Location: [facility];  Service: General;  Laterality: Bilateral;   PORT-A-CATH REMOVAL Left 02/25/2020   Procedure: REMOVAL PORT-A-CATH;  Surgeon: Jovita Kussmaul, MD;  Location: [facility];  Service: General;  Laterality: Left;   REPLACEMENT TOTAL KNEE Left 04/28/2020   REPLACEMENT TOTAL KNEE Right 06/21/2020   SENTINEL NODE BIOPSY Right 11/17/2019   Procedure: RIGHT SENTINEL NODE BIOPSY;  Surgeon: Jovita Kussmaul, MD;  Location: [facility];  Service: General;  Laterality: Right;   SPINE SURGERY      Social History   Socioeconomic History   Marital status: Divorced    Spouse name: Not on file   Number of children: 1   Years of education: Not on file   Highest education level: Not on file  Occupational History   Occupation: RN    Employer: [employer]  Tobacco Use   Smoking status: Former    Types: Cigarettes    Quit date: 03/16/2000    Years since quitting: 22.5   Smokeless tobacco: Never  Vaping Use   Vaping Use: Never used  Substance and Sexual Activity    Alcohol use: No   Drug use: No   Sexual activity: Not on file  Other Topics Concern   Not on file  Social History Narrative   Not on file   Social Determinants of Health   Financial Resource Strain: Low Risk  (08/15/2022)   Overall Financial Resource Strain (CARDIA)    Difficulty of Paying Living Expenses: Not hard at all  Food Insecurity: No Food Insecurity (08/15/2022)   Hunger Vital Sign    Worried About Estate manager/land agent of Food in the Last Year: Never true    [place] in the Last Year: Never true  Transportation Needs: No Transportation Needs (08/15/2022)   PRAPARE - Hydrologist (Medical): No    Lack of Transportation (Non-Medical): No  Physical Activity: Sufficiently Active (08/15/2022)  Exercise Vital Sign    Days of Exercise per Week: 5 days    Minutes of Exercise per Session: 30 min  Stress: No Stress Concern Present (08/15/2022)   [place]    Feeling of Stress : Not at all  Social Connections: Socially Isolated (08/15/2022)   Social Connection and Isolation Panel [NHANES]    Frequency of Communication with Friends and Family: More than three times a week    Frequency of Social Gatherings with Friends and Family: More than three times a week    Attends Religious Services: Never    Marine scientist or Organizations: No    Attends Archivist Meetings: Never    Marital Status: Divorced  Human resources officer Violence: Not At Risk (08/15/2022)   Humiliation, Afraid, Rape, and Kick questionnaire    Fear of Current or Ex-Partner: No    Emotionally Abused: No    Physically Abused: No    Sexually Abused: No    Immunization History  Administered Date(s) Administered   Fluad Quad(high Dose 65+) 06/04/2021   Influenza,inj,Quad PF,6+ Mos 06/17/2015   Influenza-Unspecified 06/17/2015, 06/16/2020, 05/19/2022   MODERNA COVID-19 SARS-COV-2 PEDS BIVALENT BOOSTER  6Y-11Y 02/15/2021, 06/04/2021   Moderna Sars-Covid-2 Vaccination 12/15/2019, 01/12/2020, 08/15/2020   PNEUMOCOCCAL CONJUGATE-20 06/04/2021   Pneumococcal-Unspecified 08/16/2020   Tdap 04/16/2020   Zoster Recombinat (Shingrix) 08/16/2020, 10/17/2020, 12/12/2020    Family History  Adopted: Yes  Family history unknown: Yes     Current Outpatient Medications:    alendronate (FOSAMAX) 70 MG tablet, TAKE ONE TABLET BY MOUTH ONCE A WEEK ON AN EMPTY STOMACH  WITH A FULL GLASS OF WATER, Disp: 12 tablet, Rfl: 2   amLODipine (NORVASC) 5 MG tablet, Take 1 tablet (5 mg total) by mouth daily., Disp: 90 tablet, Rfl: 1   Cholecalciferol (VITAMIN D3) 5000 units CAPS, Take by mouth., Disp: , Rfl:    fluticasone (FLONASE) 50 MCG/ACT nasal spray, Place 2 sprays into both nostrils daily., Disp: 16 g, Rfl: 6   letrozole (FEMARA) 2.5 MG tablet, Take 1 tablet (2.5 mg total) by mouth daily., Disp: 90 tablet, Rfl: 1   lisinopril-hydrochlorothiazide (ZESTORETIC) 10-12.5 MG tablet, Take 1 tablet by mouth daily., Disp: 90 tablet, Rfl: 1   loratadine (CLARITIN) 10 MG tablet, Take 1 tablet (10 mg total) by mouth daily as needed., Disp: 90 tablet, Rfl: 1   MULTIPLE VITAMINS PO, MULTIVITAMINS (Oral Tablet)  1 Every Day for 0 days  Quantity: 0.00;  Refills: 0   Ordered :17-Apr-2011  [name] ;  Started 24-February-2009 Active, Disp: , Rfl:    OMEGA 3-6-9 FATTY ACIDS PO, Reported on 10/04/2015, Disp: , Rfl:    simvastatin (ZOCOR) 20 MG tablet, Take 1 tablet (20 mg total) by mouth at bedtime., Disp: 90 tablet, Rfl: 1   Vitamin D, Ergocalciferol, (DRISDOL) 1.25 MG (50000 UNIT) CAPS capsule, Take 1 capsule by mouth once a week, Disp: 12 capsule, Rfl: 1   vitamin E 1000 UNIT capsule, Take 1,000 Units by mouth daily., Disp: , Rfl:    baclofen (LIORESAL) 10 MG tablet, Take 1 tablet (10 mg total) by mouth daily. (Patient not taking: Reported on 10/04/2022), Disp: 90 tablet, Rfl: 1   metFORMIN (GLUCOPHAGE) 1000 MG tablet, Take 1  tablet (1,000 mg total) by mouth 2 (two) times daily with a meal. (Patient not taking: Reported on 10/04/2022), Disp: 180 tablet, Rfl: 1 No current facility-administered medications for this visit.  Facility-Administered Medications Ordered in  Other Visits:    heparin lock flush 100 unit/mL, 500 Units, Intravenous, Once, Finnegan, Kathlene November, MD   sodium chloride flush (NS) 0.9 % injection 10 mL, 10 mL, Intravenous, Once, Grayland Ormond, Kathlene November, MD  Physical exam: Exam limited due to telemedicine Physical Exam    Assessment and plan- Patient is a 72 y.o. female    Visit Diagnosis 1. Malignant neoplasm of central portion of right breast in female, estrogen receptor positive ([place])   2. Osteopenia of neck of right femur      1. Clinical stage IIa ER/PR positive, HER-2 negative invasive carcinoma of the central portion of the right breast: Although patient was a clinical stage IIa and typically neoadjuvant chemotherapy is the recommendation, she initially did not wish to pursue aggressive immunosuppressive treatment during the COVID-19 pandemic.  Instead, patient received neoadjuvant letrozole from April 2020 through August 2020.  She then proceeded with neoadjuvant chemotherapy using Adriamycin, Cytoxan with Udenyca support followed by weekly Taxol x12. Patient completed her chemotherapy on September 30, 2019. She subsequently underwent bilateral mastectomy on November 17, 2019. Residual disease was noted in the breast and patient had evidence of micrometastasis in one lymph node. Because of her bilateral mastectomy, she did not require adjuvant XRT. Continue letrozole for a minimum of 5 years through April 2025. Although given her high risk disease, will likely extend treatment 7 to 10 years. Today she is doing well. No evidence of recurrence. She will continue her letrozole at this time. Patient will have video-assisted telemedicine visit in 6 months for routine evaluation.      2.  Osteopenia: Bone  mineral density on December 14, 2020 reported T score of -1.6 which is improved slightly over 1 year prior where the T score was reported -1.9.  Continue calcium and vitamin D.  Overdue for DEXA which I have reordered today.    Patient expressed understanding and was in agreement with this plan. She also understands that She can call clinic at any time with any questions, concerns, or complaints.   I discussed the assessment and treatment plan with the patient. The patient was provided an opportunity to ask questions and all were answered. The patient agreed with the plan and demonstrated an understanding of the instructions.   The patient was advised to call back or seek an in-person evaluation if the symptoms worsen or if the condition fails to improve as anticipated.   I spent 10 minutes on this telephone encounter.  Video connection was lost at >50% of the duration of the visit, at which time the remainder of the visit was completed via audio only.  Thank you for allowing me to participate in the care of this very pleasant patient.   [facility]  CC:

## 2022-10-09 ENCOUNTER — Other Ambulatory Visit: Payer: Self-pay | Admitting: Physician Assistant

## 2022-10-09 ENCOUNTER — Encounter: Payer: Self-pay | Admitting: *Deleted

## 2022-10-09 DIAGNOSIS — H9202 Otalgia, left ear: Secondary | ICD-10-CM

## 2022-10-09 DIAGNOSIS — E1169 Type 2 diabetes mellitus with other specified complication: Secondary | ICD-10-CM

## 2022-10-09 DIAGNOSIS — J309 Allergic rhinitis, unspecified: Secondary | ICD-10-CM

## 2022-10-09 DIAGNOSIS — G500 Trigeminal neuralgia: Secondary | ICD-10-CM

## 2022-10-09 DIAGNOSIS — E119 Type 2 diabetes mellitus without complications: Secondary | ICD-10-CM

## 2022-10-09 DIAGNOSIS — I152 Hypertension secondary to endocrine disorders: Secondary | ICD-10-CM

## 2022-10-09 DIAGNOSIS — M85851 Other specified disorders of bone density and structure, right thigh: Secondary | ICD-10-CM

## 2022-10-09 NOTE — Telephone Encounter (Signed)
Patient called to verify which medication she needs refilling, list went over with patient and the selected medications are what will need refilling.

## 2022-10-09 NOTE — Telephone Encounter (Signed)
Pt reports that she had an appt in November and she was told her prescriptions would be renewed. Pt stated when she contacted the pharmacy only one Rx was renewed and able to be refilled. Pt that all her other prescriptions be sent to [facility] ([facility] [place] [facility] [place]. Pt also  requests call back to discuss. Cb# [phone]

## 2022-10-10 MED ORDER — BACLOFEN 10 MG PO TABS: 10.0000 mg | ORAL_TABLET | Freq: Every day | ORAL | 1 refills | Status: AC

## 2022-10-10 MED ORDER — SIMVASTATIN 20 MG PO TABS: 20.0000 mg | ORAL_TABLET | Freq: Every day | ORAL | 1 refills | Status: AC

## 2022-10-10 MED ORDER — LISINOPRIL-HYDROCHLOROTHIAZIDE 10-12.5 MG PO TABS: 1.0000 | ORAL_TABLET | Freq: Every day | ORAL | 1 refills | Status: DC

## 2022-10-10 MED ORDER — FLUTICASONE PROPIONATE 50 MCG/ACT NA SUSP: 2.0000 | Freq: Every day | NASAL | 6 refills | Status: AC

## 2022-10-10 MED ORDER — AMLODIPINE BESYLATE 5 MG PO TABS: 5.0000 mg | ORAL_TABLET | Freq: Every day | ORAL | 1 refills | Status: DC

## 2022-10-10 MED ORDER — LORATADINE 10 MG PO TABS: 10.0000 mg | ORAL_TABLET | Freq: Every day | ORAL | 1 refills | Status: AC | PRN

## 2022-10-10 NOTE — Telephone Encounter (Signed)
Requested Prescriptions  Pending Prescriptions Disp Refills   simvastatin (ZOCOR) 20 MG tablet 90 tablet 1    Sig: Take 1 tablet (20 mg total) by mouth at bedtime.     Cardiovascular:  Antilipid - Statins Failed - 10/09/2022  5:06 PM      Failed - Lipid Panel in normal range within the last 12 months    Cholesterol, Total  Date Value Ref Range Status  08/15/2022 181 100 - 199 mg/dL Final   LDL Chol Calc (NIH)  Date Value Ref Range Status  08/15/2022 91 0 - 99 mg/dL Final   HDL  Date Value Ref Range Status  08/15/2022 76 >39 mg/dL Final   Triglycerides  Date Value Ref Range Status  08/15/2022 78 0 - 149 mg/dL Final         Passed - Patient is not pregnant      Passed - Valid encounter within last 12 months    Recent Outpatient Visits           1 month ago Type 2 diabetes mellitus without complication, without long-term current use of insulin ([place])   [place] Thedore Mins, [place], PA-C   6 months ago Hypertension associated with diabetes [facility])   [place] [name], PA-C   9 months ago Acute otitis externa of left ear, unspecified type   [place] [name], PA-C   10 months ago Left ear pain   [place], [place], PA-C   1 year ago Right wrist pain   [place], [place]       Future Appointments             In 6 months Grayland Ormond, Kathlene November, MD [place] at [facility]             lisinopril-hydrochlorothiazide (ZESTORETIC) 10-12.5 MG tablet 90 tablet 1    Sig: Take 1 tablet by mouth daily.     Cardiovascular:  ACEI + Diuretic Combos Failed - 10/09/2022  5:06 PM      Failed - Last BP in normal range    BP Readings from Last 1 Encounters:  08/15/22 (!) 130/96         Passed - Na in normal range and within 180 days    Sodium  Date Value Ref Range Status   08/15/2022 140 134 - 144 mmol/L Final         Passed - K in normal range and within 180 days    Potassium  Date Value Ref Range Status  08/15/2022 5.1 3.5 - 5.2 mmol/L Final         Passed - Cr in normal range and within 180 days    Creatinine, Ser  Date Value Ref Range Status  08/15/2022 0.91 0.57 - 1.00 mg/dL Final         Passed - eGFR is 30 or above and within 180 days    GFR calc Af Amer  Date Value Ref Range Status  08/04/2020 95 >59 mL/min/1.73 Final    Comment:    **In accordance with recommendations from the NKF-ASN Task force,**   [facility] is in the process of updating its eGFR calculation to the   2021 CKD-EPI creatinine equation that estimates kidney function   without a race variable.    GFR calc non Af Amer  Date Value Ref Range Status  08/04/2020 82 >59  mL/min/1.73 Final   eGFR  Date Value Ref Range Status  08/15/2022 69 >59 mL/min/1.73 Final         Passed - Patient is not pregnant      Passed - Valid encounter within last 6 months    Recent Outpatient Visits           1 month ago Type 2 diabetes mellitus without complication, without long-term current use of insulin ([place])   [place] Thedore Mins, [place], PA-C   6 months ago Hypertension associated with diabetes [facility])   [place] [name], PA-C   9 months ago Acute otitis externa of left ear, unspecified type   [place] [name], PA-C   10 months ago Left ear pain   [place], [place], PA-C   1 year ago Right wrist pain   [place], Clearnce Sorrel, [place]       Future Appointments             In 6 months Grayland Ormond, Kathlene November, MD [place] at [facility]             alendronate (FOSAMAX) 70 MG tablet 12 tablet 2    Sig: TAKE ONE TABLET BY MOUTH ONCE A WEEK ON AN EMPTY STOMACH  WITH A FULL GLASS OF WATER      Endocrinology:  Bisphosphonates Failed - 10/09/2022  5:06 PM      Failed - Vitamin D in normal range and within 360 days    Vit D, 25-Hydroxy  Date Value Ref Range Status  08/04/2020 75.7 30.0 - 100.0 ng/mL Final    Comment:    Vitamin D deficiency has been defined by the [place] and an Endocrine Society practice guideline as a level of serum 25-OH vitamin D less than 20 ng/mL (1,2). The Endocrine Society went on to further define vitamin D insufficiency as a level between 21 and 29 ng/mL (2). 1. IOM (Institute of Medicine). 2010. Dietary reference    intakes for calcium and D. [place]: The    [employer]. 2. Holick MF, [place], Bischoff-Ferrari HA, et al.    Evaluation, treatment, and prevention of vitamin D    deficiency: an Endocrine Society clinical practice    guideline. JCEM. 2011 Jul; 96(7):1911-30.          Failed - Mg Level in normal range and within 360 days    No results found for: "MG"       Failed - Phosphate in normal range and within 360 days    No results found for: "PHOS"       Passed - Ca in normal range and within 360 days    Calcium  Date Value Ref Range Status  08/15/2022 9.6 8.7 - 10.3 mg/dL Final         Passed - Cr in normal range and within 360 days    Creatinine, Ser  Date Value Ref Range Status  08/15/2022 0.91 0.57 - 1.00 mg/dL Final         Passed - eGFR is 30 or above and within 360 days    GFR calc Af Amer  Date Value Ref Range Status  08/04/2020 95 >59 mL/min/1.73 Final    Comment:    **In accordance with recommendations from the NKF-ASN Task force,**   [facility] is in the process of updating its eGFR calculation  to the   2021 CKD-EPI creatinine equation that estimates kidney function   without a race variable.    GFR calc non Af Amer  Date Value Ref Range Status  08/04/2020 82 >59 mL/min/1.73 Final   eGFR  Date Value Ref Range Status  08/15/2022 69 >59 mL/min/1.73 Final         Passed -  Valid encounter within last 12 months    Recent Outpatient Visits           1 month ago Type 2 diabetes mellitus without complication, without long-term current use of insulin [facility])   [place] Thedore Mins, [place], PA-C   6 months ago Hypertension associated with diabetes [facility])   [place] [name], PA-C   9 months ago Acute otitis externa of left ear, unspecified type   [place] [name], PA-C   10 months ago Left ear pain   [place], [place], PA-C   1 year ago Right wrist pain   [place], [place], [place]       Future Appointments             In 6 months Grayland Ormond, Kathlene November, MD [place] at [place] or Dexa Scan completed in the last 2 years       amLODipine (NORVASC) 5 MG tablet 90 tablet 1    Sig: Take 1 tablet (5 mg total) by mouth daily.     Cardiovascular: Calcium Channel Blockers 2 Failed - 10/09/2022  5:06 PM      Failed - Last BP in normal range    BP Readings from Last 1 Encounters:  08/15/22 (!) 130/96         Passed - Last Heart Rate in normal range    Pulse Readings from Last 1 Encounters:  08/15/22 68         Passed - Valid encounter within last 6 months    Recent Outpatient Visits           1 month ago Type 2 diabetes mellitus without complication, without long-term current use of insulin [facility])   [place] Thedore Mins, [place], PA-C   6 months ago Hypertension associated with diabetes [facility])   [place] [name], PA-C   9 months ago Acute otitis externa of left ear, unspecified type   [place] [name], PA-C   10 months ago Left ear pain   [place], [place], PA-C   1 year ago Right  wrist pain   [place], Clearnce Sor[place], [place]       Future Appointments             In 6 months Grayland Ormond, Kathlene November, MD [place] at [facility]             metFORMIN (GLUCOPHAGE) 1000 MG tablet 180 tablet 1    Sig: Take 1 tablet (1,000 mg total) by mouth 2 (two) times daily with a meal.     Endocrinology:  Diabetes - Biguanides Failed - 10/09/2022  5:06 PM      Failed - B12 Level in normal range and within 720 days    No results found for: "VITAMINB12"  Failed - CBC within normal limits and completed in the last 12 months    WBC  Date Value Ref Range Status  08/04/2020 6.9 3.4 - 10.8 x10E3/uL Final  09/30/2019 4.3 4.0 - 10.5 K/uL Final   RBC  Date Value Ref Range Status  08/04/2020 4.99 3.77 - 5.28 x10E6/uL Final  09/30/2019 3.68 (L) 3.87 - 5.11 MIL/uL Final   Hemoglobin  Date Value Ref Range Status  08/04/2020 12.5 11.1 - 15.9 g/dL Final   Hematocrit  Date Value Ref Range Status  08/04/2020 39.5 34.0 - 46.6 % Final   MCHC  Date Value Ref Range Status  08/04/2020 31.6 31.5 - 35.7 g/dL Final  09/30/2019 31.3 30.0 - 36.0 g/dL Final   [facility]  Date Value Ref Range Status  08/04/2020 25.1 (L) 26.6 - 33.0 pg Final  09/30/2019 28.0 26.0 - 34.0 pg Final   MCV  Date Value Ref Range Status  08/04/2020 79 79 - 97 fL Final   No results found for: "PLTCOUNTKUC", "LABPLAT", "POCPLA" RDW  Date Value Ref Range Status  08/04/2020 14.5 11.7 - 15.4 % Final         Passed - Cr in normal range and within 360 days    Creatinine, Ser  Date Value Ref Range Status  08/15/2022 0.91 0.57 - 1.00 mg/dL Final         Passed - HBA1C is between 0 and 7.9 and within 180 days    Hgb A1c MFr Bld  Date Value Ref Range Status  08/15/2022 5.8 (H) 4.8 - 5.6 % Final    Comment:             Prediabetes: 5.7 - 6.4          Diabetes: >6.4          Glycemic control for adults with diabetes: <7.0          Passed - eGFR in  normal range and within 360 days    GFR calc Af Amer  Date Value Ref Range Status  08/04/2020 95 >59 mL/min/1.73 Final    Comment:    **In accordance with recommendations from the NKF-ASN Task force,**   [facility] is in the process of updating its eGFR calculation to the   2021 CKD-EPI creatinine equation that estimates kidney function   without a race variable.    GFR calc non Af Amer  Date Value Ref Range Status  08/04/2020 82 >59 mL/min/1.73 Final   eGFR  Date Value Ref Range Status  08/15/2022 69 >59 mL/min/1.73 Final         Passed - Valid encounter within last 6 months    Recent Outpatient Visits           1 month ago Type 2 diabetes mellitus without complication, without long-term current use of insulin [facility])   [place] Thedore Mins, [place], PA-C   6 months ago Hypertension associated with diabetes [facility])   [place] [name], PA-C   9 months ago Acute otitis externa of left ear, unspecified type   [place] [name], PA-C   10 months ago Left ear pain   [place], [place], PA-C   1 year ago Right wrist pain   [place], Clearnce Sor[place], [place]       Future Appointments             In 6 months Delight Hoh  J, MD [place] at [facility]             baclofen (LIORESAL) 10 MG tablet 90 tablet 1    Sig: Take 1 tablet (10 mg total) by mouth daily.     Analgesics:  Muscle Relaxants - baclofen Passed - 10/09/2022  5:06 PM      Passed - Cr in normal range and within 180 days    Creatinine, Ser  Date Value Ref Range Status  08/15/2022 0.91 0.57 - 1.00 mg/dL Final         Passed - eGFR is 30 or above and within 180 days    GFR calc Af Amer  Date Value Ref Range Status  08/04/2020 95 >59 mL/min/1.73 Final    Comment:    **In accordance with recommendations from the NKF-ASN  Task force,**   [facility] is in the process of updating its eGFR calculation to the   2021 CKD-EPI creatinine equation that estimates kidney function   without a race variable.    GFR calc non Af Amer  Date Value Ref Range Status  08/04/2020 82 >59 mL/min/1.73 Final   eGFR  Date Value Ref Range Status  08/15/2022 69 >59 mL/min/1.73 Final         Passed - Valid encounter within last 6 months    Recent Outpatient Visits           1 month ago Type 2 diabetes mellitus without complication, without long-term current use of insulin [facility])   [place] Thedore Mins, [place], PA-C   6 months ago Hypertension associated with diabetes [facility])   [place] [name], PA-C   9 months ago Acute otitis externa of left ear, unspecified type   [place] [name], PA-C   10 months ago Left ear pain   [place], [place], PA-C   1 year ago Right wrist pain   [place], [place]       Future Appointments             In 6 months Grayland Ormond, Kathlene November, MD [place] at [facility]             fluticasone [facility]) 50 MCG/ACT nasal spray 16 g 6    Sig: Place 2 sprays into both nostrils daily.     Ear, Nose, and Throat: Nasal Preparations - Corticosteroids Passed - 10/09/2022  5:06 PM      Passed - Valid encounter within last 12 months    Recent Outpatient Visits           1 month ago Type 2 diabetes mellitus without complication, without long-term current use of insulin [facility])   [place] Thedore Mins, [place], PA-C   6 months ago Hypertension associated with diabetes [facility])   [place] [name], PA-C   9 months ago Acute otitis externa of left ear, unspecified type   [place] [name], PA-C   10 months ago  Left ear pain   [place], [place], PA-C   1 year ago Right wrist pain   [place], Clearnce Sor[place], [place]       Future Appointments             In 6 months Grayland Ormond, Kathlene November, MD [place]  [place] at [facility]             loratadine (CLARITIN) 10 MG tablet 90 tablet 1    Sig: Take 1 tablet (10 mg total) by mouth daily as needed.     Ear, Nose, and Throat:  Antihistamines 2 Passed - 10/09/2022  5:06 PM      Passed - Cr in normal range and within 360 days    Creatinine, Ser  Date Value Ref Range Status  08/15/2022 0.91 0.57 - 1.00 mg/dL Final         Passed - Valid encounter within last 12 months    Recent Outpatient Visits           1 month ago Type 2 diabetes mellitus without complication, without long-term current use of insulin [facility])   [place] Thedore Mins, [place], PA-C   6 months ago Hypertension associated with diabetes [facility])   [place] [name], PA-C   9 months ago Acute otitis externa of left ear, unspecified type   [place] [name], PA-C   10 months ago Left ear pain   [place], [place], PA-C   1 year ago Right wrist pain   [place], Clearnce Sorrel, [place]       Future Appointments             In 6 months [place], [place] at [facility]             Multiple Vitamins tablet      Sig: MULTIVITAMINS (Oral Tablet)  1 Every Day for 0 days  Quantity: 0.00;  Refills: 0   Ordered :17-Apr-2011  [name] ;  Started 24-February-2009 Active     There is no refill protocol information for this order     vitamin E 1000 UNIT capsule      Sig: Take 1 capsule (1,000 Units total) by mouth daily.     Off-Protocol Failed - 10/09/2022  5:06 PM      Failed - Medication not assigned  to a protocol, review manually.      Passed - Valid encounter within last 12 months    Recent Outpatient Visits           1 month ago Type 2 diabetes mellitus without complication, without long-term current use of insulin [facility])   [place] Thedore Mins, [place], PA-C   6 months ago Hypertension associated with diabetes [facility])   [place] [name], PA-C   9 months ago Acute otitis externa of left ear, unspecified type   [place] [name], PA-C   10 months ago Left ear pain   [place], [place], [place]   1 year ago Right wrist pain   [place], [place]       Future Appointments             In 6 months Grayland Ormond, Kathlene November, MD [place] at [facility]           Endocrinology:  Vitamins Passed - 10/09/2022  5:06 PM      Passed - Valid encounter within last 12 months    Recent Outpatient Visits           1 month ago Type 2 diabetes mellitus without  complication, without long-term current use of insulin [facility])   [place] Thedore Mins, [place], PA-C   6 months ago Hypertension associated with diabetes [facility])   [place] [name], PA-C   9 months ago Acute otitis externa of left ear, unspecified type   [place] [name], PA-C   10 months ago Left ear pain   [place], [place], PA-C   1 year ago Right wrist pain   [place], [place]       Future Appointments             In 6 months Grayland Ormond, Kathlene November, MD [place] at [facility]             Cholecalciferol (VITAMIN D3) 125 MCG (5000 UT) CAPS 30 capsule     Sig: Take by mouth.     Endocrinology:  Vitamins - Vitamin D  Supplementation 2 Failed - 10/09/2022  5:06 PM      Failed - Manual Review: Route requests for 50,000 IU strength to the provider      Failed - Vitamin D in normal range and within 360 days    Vit D, 25-Hydroxy  Date Value Ref Range Status  08/04/2020 75.7 30.0 - 100.0 ng/mL Final    Comment:    Vitamin D deficiency has been defined by the [place] practice guideline as a level of serum 25-OH vitamin D less than 20 ng/mL (1,2). The Endocrine Society went on to further define vitamin D insufficiency as a level between 21 and 29 ng/mL (2). 1. IOM (Institute of Medicine). 2010. Dietary reference    intakes for calcium and D. [place]: The    [employer]. 2. Holick MF, [place], Bischoff-Ferrari HA, et al.    Evaluation, treatment, and prevention of vitamin D    deficiency: an Endocrine Society clinical practice    guideline. JCEM. 2011 Jul; 96(7):1911-30.          Passed - Ca in normal range and within 360 days    Calcium  Date Value Ref Range Status  08/15/2022 9.6 8.7 - 10.3 mg/dL Final         Passed - Valid encounter within last 12 months    Recent Outpatient Visits           1 month ago Type 2 diabetes mellitus without complication, without long-term current use of insulin [facility])   [place] Thedore Mins, [place], PA-C   6 months ago Hypertension associated with diabetes [facility])   [place] [name], PA-C   9 months ago Acute otitis externa of left ear, unspecified type   [place] [name], PA-C   10 months ago Left ear pain   [place], [place], PA-C   1 year ago Right wrist pain   [place], Clearnce Sorrel, [place]       Future Appointments             In 6 months Grayland Ormond, Kathlene November, MD [place] at [facility]

## 2022-10-10 NOTE — Telephone Encounter (Signed)
Requested medication (s) are due for refill today: yes  Requested medication (s) are on the active medication list: yes  Last refill:  alendronate, metformin 03/26/22 #90/1, vit D 09/26/21  Future visit scheduled: no  Notes to clinic:  metformin and alendronate needing updated labs, MVI, vit D and vit E are historical meds from several years ago. Please advise for refills.      Requested Prescriptions  Pending Prescriptions Disp Refills   alendronate (FOSAMAX) 70 MG tablet 12 tablet 2    Sig: TAKE ONE TABLET BY MOUTH ONCE A WEEK ON AN EMPTY STOMACH  WITH A FULL GLASS OF WATER     Endocrinology:  Bisphosphonates Failed - 10/09/2022  5:06 PM      Failed - Vitamin D in normal range and within 360 days    Vit D, 25-Hydroxy  Date Value Ref Range Status  08/04/2020 75.7 30.0 - 100.0 ng/mL Final    Comment:    Vitamin D deficiency has been defined by the Institute of Medicine and an Endocrine Society practice guideline as a level of serum 25-OH vitamin D less than 20 ng/mL (1,2). The Endocrine Society went on to further define vitamin D insufficiency as a level between 21 and 29 ng/mL (2). 1. IOM (Institute of Medicine). 2010. Dietary reference    intakes for calcium and D. [place]: The    [employer]. 2. Holick MF, Binkley [place], Bischoff-Ferrari HA, et al.    Evaluation, treatment, and prevention of vitamin D    deficiency: an Endocrine Society clinical practice    guideline. JCEM. 2011 Jul; 96(7):1911-30.          Failed - Mg Level in normal range and within 360 days    No results found for: "MG"       Failed - Phosphate in normal range and within 360 days    No results found for: "PHOS"       Passed - Ca in normal range and within 360 days    Calcium  Date Value Ref Range Status  08/15/2022 9.6 8.7 - 10.3 mg/dL Final         Passed - Cr in normal range and within 360 days    Creatinine, Ser  Date Value Ref Range Status  08/15/2022 0.91 0.57 - 1.00 mg/dL  Final         Passed - eGFR is 30 or above and within 360 days    GFR calc Af Amer  Date Value Ref Range Status  08/04/2020 95 >59 mL/min/1.73 Final    Comment:    **In accordance with recommendations from the NKF-ASN Task force,**   [facility] is in the process of updating its eGFR calculation to the   2021 CKD-EPI creatinine equation that estimates kidney function   without a race variable.    GFR calc non Af Amer  Date Value Ref Range Status  08/04/2020 82 >59 mL/min/1.73 Final   eGFR  Date Value Ref Range Status  08/15/2022 69 >59 mL/min/1.73 Final         Passed - Valid encounter within last 12 months    Recent Outpatient Visits           1 month ago Type 2 diabetes mellitus without complication, without long-term current use of insulin [facility])   [place] Thedore Mins, [place], PA-C   6 months ago Hypertension associated with diabetes [facility])   [place] [name], [place]   9  months ago Acute otitis externa of left ear, unspecified type   [place] [name], PA-C   10 months ago Left ear pain   [place], [place], PA-C   1 year ago Right wrist pain   [place], Clearnce Sorrel, [place]       Future Appointments             In 6 months Grayland Ormond, Kathlene November, MD [place] at [place] or Dexa Scan completed in the last 2 years       metFORMIN (GLUCOPHAGE) 1000 MG tablet 180 tablet 1    Sig: Take 1 tablet (1,000 mg total) by mouth 2 (two) times daily with a meal.     Endocrinology:  Diabetes - Biguanides Failed - 10/09/2022  5:06 PM      Failed - B12 Level in normal range and within 720 days    No results found for: "VITAMINB12"       Failed - CBC within normal limits and completed in the last 12 months    WBC  Date Value Ref Range Status   08/04/2020 6.9 3.4 - 10.8 x10E3/uL Final  09/30/2019 4.3 4.0 - 10.5 K/uL Final   RBC  Date Value Ref Range Status  08/04/2020 4.99 3.77 - 5.28 x10E6/uL Final  09/30/2019 3.68 (L) 3.87 - 5.11 MIL/uL Final   Hemoglobin  Date Value Ref Range Status  08/04/2020 12.5 11.1 - 15.9 g/dL Final   Hematocrit  Date Value Ref Range Status  08/04/2020 39.5 34.0 - 46.6 % Final   MCHC  Date Value Ref Range Status  08/04/2020 31.6 31.5 - 35.7 g/dL Final  09/30/2019 31.3 30.0 - 36.0 g/dL Final   [facility]  Date Value Ref Range Status  08/04/2020 25.1 (L) 26.6 - 33.0 pg Final  09/30/2019 28.0 26.0 - 34.0 pg Final   MCV  Date Value Ref Range Status  08/04/2020 79 79 - 97 fL Final   No results found for: "PLTCOUNTKUC", "LABPLAT", "POCPLA" RDW  Date Value Ref Range Status  08/04/2020 14.5 11.7 - 15.4 % Final         Passed - Cr in normal range and within 360 days    Creatinine, Ser  Date Value Ref Range Status  08/15/2022 0.91 0.57 - 1.00 mg/dL Final         Passed - HBA1C is between 0 and 7.9 and within 180 days    Hgb A1c MFr Bld  Date Value Ref Range Status  08/15/2022 5.8 (H) 4.8 - 5.6 % Final    Comment:             Prediabetes: 5.7 - 6.4          Diabetes: >6.4          Glycemic control for adults with diabetes: <7.0          Passed - eGFR in normal range and within 360 days    GFR calc Af Amer  Date Value Ref Range Status  08/04/2020 95 >59 mL/min/1.73 Final    Comment:    **In accordance with recommendations from the NKF-ASN Task force,**   [facility] is in the process of updating its eGFR calculation to the   2021 CKD-EPI creatinine equation that estimates kidney function   without a race variable.    GFR  calc non Af Amer  Date Value Ref Range Status  08/04/2020 82 >59 mL/min/1.73 Final   eGFR  Date Value Ref Range Status  08/15/2022 69 >59 mL/min/1.73 Final         Passed - Valid encounter within last 6 months    Recent Outpatient Visits           1 month  ago Type 2 diabetes mellitus without complication, without long-term current use of insulin [facility])   [place] Thedore Mins, [place], PA-C   6 months ago Hypertension associated with diabetes [facility])   [place] [name], PA-C   9 months ago Acute otitis externa of left ear, unspecified type   [place] [name], PA-C   10 months ago Left ear pain   [place], [place], PA-C   1 year ago Right wrist pain   [place], [place]       Future Appointments             In 6 months Grayland Ormond, Kathlene November, [place] at [facility]             Multiple Vitamins tablet      Sig: MULTIVITAMINS (Oral Tablet)  1 Every Day for 0 days  Quantity: 0.00;  Refills: 0   Ordered :17-Apr-2011  [name] ;  Started 24-February-2009 Active     There is no refill protocol information for this order     vitamin E 1000 UNIT capsule      Sig: Take 1 capsule (1,000 Units total) by mouth daily.     Off-Protocol Failed - 10/09/2022  5:06 PM      Failed - Medication not assigned to a protocol, review manually.      Passed - Valid encounter within last 12 months    Recent Outpatient Visits           1 month ago Type 2 diabetes mellitus without complication, without long-term current use of insulin [facility])   [place] Thedore Mins, [place], PA-C   6 months ago Hypertension associated with diabetes [facility])   [place] [name], PA-C   9 months ago Acute otitis externa of left ear, unspecified type   [place] [name], PA-C   10 months ago Left ear pain   [place], [place], PA-C   1 year ago Right wrist pain   [place], Clearnce Sorrel, [place]       Future  Appointments             In 6 months Grayland Ormond, Kathlene November, MD [place] at [facility]           Endocrinology:  Vitamins Passed - 10/09/2022  5:06 PM      Passed - Valid encounter within last 12 months    Recent Outpatient Visits           1 month ago Type 2 diabetes mellitus without complication, without long-term current use of insulin [facility])   [place] [name], PA-C   6 months ago Hypertension associated with diabetes [facility])   [place] [name], PA-C   9 months ago Acute otitis externa of left ear, unspecified type   [place]  [name], PA-C   10 months ago Left ear pain   [place], [place], [place]   1 year ago Right wrist pain   [place], [place]       Future Appointments             In 6 months Grayland Ormond, Kathlene November, MD [place] at [facility]             Cholecalciferol (VITAMIN D3) 125 MCG (5000 UT) CAPS 30 capsule     Sig: Take by mouth.     Endocrinology:  Vitamins - Vitamin D Supplementation 2 Failed - 10/09/2022  5:06 PM      Failed - Manual Review: Route requests for 50,000 IU strength to the provider      Failed - Vitamin D in normal range and within 360 days    Vit D, 25-Hydroxy  Date Value Ref Range Status  08/04/2020 75.7 30.0 - 100.0 ng/mL Final    Comment:    Vitamin D deficiency has been defined by the [place] practice guideline as a level of serum 25-OH vitamin D less than 20 ng/mL (1,2). The Endocrine Society went on to further define vitamin D insufficiency as a level between 21 and 29 ng/mL (2). 1. IOM (Institute of Medicine). 2010. Dietary reference    intakes for calcium and D. [place]: The    [employer]. 2. Holick MF, Binkley [place], Bischoff-Ferrari  HA, et al.    Evaluation, treatment, and prevention of vitamin D    deficiency: an Endocrine Society clinical practice    guideline. JCEM. 2011 Jul; 96(7):1911-30.          Passed - Ca in normal range and within 360 days    Calcium  Date Value Ref Range Status  08/15/2022 9.6 8.7 - 10.3 mg/dL Final         Passed - Valid encounter within last 12 months    Recent Outpatient Visits           1 month ago Type 2 diabetes mellitus without complication, without long-term current use of insulin [facility])   [place] Thedore Mins, [place], PA-C   6 months ago Hypertension associated with diabetes [facility])   [place] [name], PA-C   9 months ago Acute otitis externa of left ear, unspecified type   [place] [name], PA-C   10 months ago Left ear pain   [place], [place], PA-C   1 year ago Right wrist pain   [place], [place] M, [place]       Future Appointments             In 6 months Grayland Ormond, Kathlene November, MD [place] at [place] Refills   simvastatin (ZOCOR) 20 MG tablet 90 tablet 1    Sig: Take 1 tablet (20 mg total) by mouth at bedtime.     Cardiovascular:  Antilipid - Statins Failed - 10/09/2022  5:06 PM      Failed - Lipid Panel in normal range within the last 12 months    Cholesterol, Total  Date Value Ref Range Status  08/15/2022 181 100 - 199 mg/dL Final   LDL Chol Calc (NIH)  Date Value Ref Range Status  08/15/2022 91 0 - 99 mg/dL Final   HDL  Date Value Ref Range Status  08/15/2022 76 >39 mg/dL Final   Triglycerides  Date Value Ref Range Status  08/15/2022 78 0 - 149 mg/dL Final         Passed - Patient is not pregnant      Passed - Valid encounter within last 12 months    Recent Outpatient Visits           1 month ago Type  2 diabetes mellitus without complication, without long-term current use of insulin ([place])   [place] Thedore Mins, [place], PA-C   6 months ago Hypertension associated with diabetes [facility])   [place] [name], PA-C   9 months ago Acute otitis externa of left ear, unspecified type   [place] [name], PA-C   10 months ago Left ear pain   [place], [place], PA-C   1 year ago Right wrist pain   [place], Clearnce Sorrel, [place]       Future Appointments             In 6 months Grayland Ormond, Kathlene November, MD [place] at [facility]             lisinopril-hydrochlorothiazide (ZESTORETIC) 10-12.5 MG tablet 90 tablet 1    Sig: Take 1 tablet by mouth daily.     Cardiovascular:  ACEI + Diuretic Combos Failed - 10/09/2022  5:06 PM      Failed - Last BP in normal range    BP Readings from Last 1 Encounters:  08/15/22 (!) 130/96         Passed - Na in normal range and within 180 days    Sodium  Date Value Ref Range Status  08/15/2022 140 134 - 144 mmol/L Final         Passed - K in normal range and within 180 days    Potassium  Date Value Ref Range Status  08/15/2022 5.1 3.5 - 5.2 mmol/L Final         Passed - Cr in normal range and within 180 days    Creatinine, Ser  Date Value Ref Range Status  08/15/2022 0.91 0.57 - 1.00 mg/dL Final         Passed - eGFR is 30 or above and within 180 days    GFR calc Af Amer  Date Value Ref Range Status  08/04/2020 95 >59 mL/min/1.73 Final    Comment:    **In accordance with recommendations from the NKF-ASN Task force,**   [facility] is in the process of updating its eGFR calculation to the   2021 CKD-EPI creatinine equation that estimates kidney function   without a race variable.    GFR calc non Af Amer  Date Value Ref Range Status  08/04/2020 82 >59  mL/min/1.73 Final   eGFR  Date Value Ref Range Status  08/15/2022 69 >59 mL/min/1.73 Final         Passed - Patient is not pregnant      Passed - Valid encounter within last 6 months    Recent Outpatient Visits           1 month ago Type 2 diabetes mellitus without complication, without long-term current use of insulin [facility])   [place] [name], [place]  6 months ago Hypertension associated with diabetes [facility])   [place] [name], PA-C   9 months ago Acute otitis externa of left ear, unspecified type   [place] [name], PA-C   10 months ago Left ear pain   [place], [place], PA-C   1 year ago Right wrist pain   [place], [place] Malta M, [place]       Future Appointments             In 6 months Grayland Ormond, Kathlene November, MD [place] at [facility]             amLODipine (NORVASC) 5 MG tablet 90 tablet 1    Sig: Take 1 tablet (5 mg total) by mouth daily.     Cardiovascular: Calcium Channel Blockers 2 Failed - 10/09/2022  5:06 PM      Failed - Last BP in normal range    BP Readings from Last 1 Encounters:  08/15/22 (!) 130/96         Passed - Last Heart Rate in normal range    Pulse Readings from Last 1 Encounters:  08/15/22 68         Passed - Valid encounter within last 6 months    Recent Outpatient Visits           1 month ago Type 2 diabetes mellitus without complication, without long-term current use of insulin [facility])   [place] Thedore Mins, [place], PA-C   6 months ago Hypertension associated with diabetes [facility])   [place] [name], PA-C   9 months ago Acute otitis externa of left ear, unspecified type   [place] [name], PA-C   10 months ago Left ear pain   [place], [place], PA-C   1 year ago Right wrist pain   [place], [place], [place]       Future Appointments             In 6 months Grayland Ormond, Kathlene November, MD [place] at [facility]             baclofen (LIORESAL) 10 MG tablet 90 tablet 1    Sig: Take 1 tablet (10 mg total) by mouth daily.     Analgesics:  Muscle Relaxants - baclofen Passed - 10/09/2022  5:06 PM      Passed - Cr in normal range and within 180 days    Creatinine, Ser  Date Value Ref Range Status  08/15/2022 0.91 0.57 - 1.00 mg/dL Final         Passed - eGFR is 30 or above and within 180 days    GFR calc Af Amer  Date Value Ref Range Status  08/04/2020 95 >59 mL/min/1.73 Final    Comment:    **In accordance with recommendations from the NKF-ASN Task force,**   [facility] is in the process of updating its eGFR calculation to the   2021 CKD-EPI creatinine equation that estimates kidney function   without a race variable.    GFR calc non Af Amer  Date Value Ref Range Status  08/04/2020 82 >59 mL/min/1.73 Final   eGFR  Date Value Ref Range Status  08/15/2022 69 >59 mL/min/1.73 Final         Passed - Valid encounter  within last 6 months    Recent Outpatient Visits           1 month ago Type 2 diabetes mellitus without complication, without long-term current use of insulin [facility])   [place] Thedore Mins, [place], PA-C   6 months ago Hypertension associated with diabetes [facility])   [place] [name], PA-C   9 months ago Acute otitis externa of left ear, unspecified type   [place] [name], PA-C   10 months ago Left ear pain   [place], [place], PA-C   1 year ago Right wrist pain   [place], [place]       Future Appointments              In 6 months Grayland Ormond, Kathlene November, MD [place] at [facility]             fluticasone [facility]) 50 MCG/ACT nasal spray 16 g 6    Sig: Place 2 sprays into both nostrils daily.     Ear, Nose, and Throat: Nasal Preparations - Corticosteroids Passed - 10/09/2022  5:06 PM      Passed - Valid encounter within last 12 months    Recent Outpatient Visits           1 month ago Type 2 diabetes mellitus without complication, without long-term current use of insulin [facility])   [place] Thedore Mins, [place], PA-C   6 months ago Hypertension associated with diabetes [facility])   [place] [name], PA-C   9 months ago Acute otitis externa of left ear, unspecified type   [place] [name], PA-C   10 months ago Left ear pain   [place], [place], PA-C   1 year ago Right wrist pain   [place], [place], [place]       Future Appointments             In 6 months Grayland Ormond, Kathlene November, [place] at [facility]             loratadine (CLARITIN) 10 MG tablet 90 tablet 1    Sig: Take 1 tablet (10 mg total) by mouth daily as needed.     Ear, Nose, and Throat:  Antihistamines 2 Passed - 10/09/2022  5:06 PM      Passed - Cr in normal range and within 360 days    Creatinine, Ser  Date Value Ref Range Status  08/15/2022 0.91 0.57 - 1.00 mg/dL Final         Passed - Valid encounter within last 12 months    Recent Outpatient Visits           1 month ago Type 2 diabetes mellitus without complication, without long-term current use of insulin [facility])   [place] Thedore Mins, [place], PA-C   6 months ago Hypertension associated with diabetes [facility])   [place] [name], PA-C   9 months ago Acute otitis externa of left ear,  unspecified type   [place] [name], PA-C   10 months ago Left ear pain   [place], [place], PA-C   1 year ago Right wrist pain   [name], PA-C       Future Appointments             In 6 months Grayland Ormond, Kathlene November, MD [place] at [facility]

## 2022-10-15 MED ORDER — ALENDRONATE SODIUM 70 MG PO TABS: ORAL_TABLET | ORAL | 2 refills | Status: DC

## 2022-10-15 MED ORDER — METFORMIN HCL 1000 MG PO TABS: 1000.0000 mg | ORAL_TABLET | Freq: Two times a day (BID) | ORAL | 1 refills | Status: AC

## 2022-12-16 ENCOUNTER — Ambulatory Visit
Admission: RE | Admit: 2022-12-16 | Discharge: 2022-12-16 | Disposition: A | Discharge status: Home / Self Care | Payer: Medicare PPO | Source: Ambulatory Visit | Attending: Medical Oncology | Admitting: Medical Oncology

## 2022-12-16 DIAGNOSIS — Z9221 Personal history of antineoplastic chemotherapy: Secondary | ICD-10-CM | POA: Diagnosis not present

## 2022-12-16 DIAGNOSIS — M85851 Other specified disorders of bone density and structure, right thigh: Secondary | ICD-10-CM | POA: Diagnosis not present

## 2022-12-16 DIAGNOSIS — C50111 Malignant neoplasm of central portion of right female breast: Secondary | ICD-10-CM | POA: Insufficient documentation

## 2022-12-16 DIAGNOSIS — Z780 Asymptomatic menopausal state: Secondary | ICD-10-CM | POA: Diagnosis not present

## 2022-12-16 DIAGNOSIS — Z170 Estrogen receptor positive status [ER+]: Secondary | ICD-10-CM | POA: Diagnosis not present

## 2022-12-16 DIAGNOSIS — M1990 Unspecified osteoarthritis, unspecified site: Secondary | ICD-10-CM | POA: Insufficient documentation

## 2022-12-16 DIAGNOSIS — E119 Type 2 diabetes mellitus without complications: Secondary | ICD-10-CM | POA: Insufficient documentation

## 2022-12-16 DIAGNOSIS — Z13820 Encounter for screening for osteoporosis: Secondary | ICD-10-CM | POA: Insufficient documentation

## 2022-12-28 ENCOUNTER — Other Ambulatory Visit: Payer: Self-pay | Admitting: Oncology

## 2022-12-28 DIAGNOSIS — Z170 Estrogen receptor positive status [ER+]: Secondary | ICD-10-CM

## 2023-02-20 ENCOUNTER — Ambulatory Visit: Payer: Medicare PPO | Admitting: Podiatry

## 2023-02-27 ENCOUNTER — Ambulatory Visit (INDEPENDENT_AMBULATORY_CARE_PROVIDER_SITE_OTHER): Payer: Medicare PPO

## 2023-02-27 ENCOUNTER — Other Ambulatory Visit: Payer: Self-pay | Admitting: Podiatry

## 2023-02-27 ENCOUNTER — Ambulatory Visit: Payer: Medicare PPO | Admitting: Podiatry

## 2023-02-27 DIAGNOSIS — M79674 Pain in right toe(s): Secondary | ICD-10-CM

## 2023-02-27 DIAGNOSIS — M21611 Bunion of right foot: Secondary | ICD-10-CM | POA: Diagnosis not present

## 2023-02-27 DIAGNOSIS — M19072 Primary osteoarthritis, left ankle and foot: Secondary | ICD-10-CM

## 2023-02-27 DIAGNOSIS — M79675 Pain in left toe(s): Secondary | ICD-10-CM

## 2023-02-27 NOTE — Progress Notes (Signed)
Subjective:  Patient ID: Nichole Martinez, female    DOB: [birth year],  MRN: [id number]  Chief Complaint  Patient presents with   Bunions    Rm 22 Bilateral bunion right over left. Pt complains of deformity of toes not so much pain. There is edema and redness of toes.     72 y.o. female presents with concern for bilateral foot bunion right worse than left.  She does report pain in the right medial side of her forefoot given the bunion deformity.  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  Started to have a lot of pain in the right foot great toe joint area due to the bunion.  She also reports chronic deformity of the left hallux.  Has had injuries to that time in the past.  Denies much pain in the left great toe at this time.  Has been treated for nail fungal infection previously by dermatology.  Past Medical History:  Diagnosis Date   Allergy    Breast cancer, right (HCC) 2020   Currently taking hormonal chemo tx's.    Depression    Diabetes mellitus without complication (HCC) 2012   Dyspnea    due to weight   Hyperlipidemia    Hypertension    Osteoporosis    Trigeminal neuralgia     Allergies  Allergen Reactions   Morphine Anaphylaxis    Anaphallaxis.   Morphine And Codeine Anaphylaxis    ROS: Negative except as per HPI above  Objective:  General: AAO x3, NAD  Dermatological: With inspection and palpation of the right and left lower extremities there are no open sores, no preulcerative lesions, no rash or signs of infection present. Nails are of normal length thickness and coloration.   Vascular:  Dorsalis Pedis artery and Posterior Tibial artery pedal pulses are 2/4 bilateral.  Capillary fill time < 3 sec to all digits.   Neruologic: Grossly intact via light touch bilateral. Protective threshold intact to all sites bilateral.   Musculoskeletal: Hallux abductovalgus deformity is present on the bilateral foot however much more pronounced on the right than  left.  She has a prominent medial eminence.  Patient does not have significant hypermobility of the first ray.  There is good range of motion of the right first MPJ without significant pain on range of motion.  Gait: Unassisted, Nonantalgic.   No images are attached to the encounter.  Radiographs:  Date: 02/27/2023 XR the right foot Weightbearing AP/Lateral/Oblique   Findings: Attention directed to the first metatarsal space angle there is increased first IM angle as well as increased hallux valgus angle.  Tibial sesamoid position is abnormal and elevated.  No significant elevatus seen on lateral view. Assessment:   1. Bunion of right foot   2. Arthritis of left foot   3. Toe pain, bilateral      Plan:  Patient was evaluated and treated and all questions answered.  # Bunion of right foot and resulting medial forefoot and great toe joint pain -Discussed with patient that she does have evidence of a moderate bunion deformity present on the right foot -I discussed with the patient conservative or surgical treatment options -Conservatively could proceed with padding strapping and gel caps prevent pressure on the medial eminence as well as anti-inflammatory medications.  She has tried most of these and a failed -Discussed surgical correction would include either Lapidus bunionectomy versus minimally invasive distal first metatarsal osteotomy.  I discussed these procedures and the differences in  detail with the patient. -I recommend minimal invasive bunionectomy with distal first metatarsal osteotomy and possible Akin osteotomy of the right foot.  Believe this will allow her to have the facets and easiest recovery compared to a Lapidus bunionectomy.  She would prefer to have a shorter and faster recovery with less chance of complication due to nonunion.  I also do not feel recurrence will be a significant concern in her case -Discussed the risk benefits alternatives and possible complications  associated with this procedure and discussed in detail.  Discussed the expected postoperative recovery course.  Patient wishes to proceed -Informed consent was obtained at this visit and we will begin surgical planning  Return for after OR.          Corinna Gab, DPM [facility]

## 2023-04-08 ENCOUNTER — Encounter: Payer: Self-pay | Admitting: Oncology

## 2023-04-08 ENCOUNTER — Inpatient Hospital Stay: Payer: Medicare PPO | Admitting: Oncology

## 2023-04-08 NOTE — Progress Notes (Unsigned)
[facility]  [phone]) [phone] Fax:(336) [phone]  ID: Christen Bame OB: 07/19/1951  MR#: [id number]  KGM#:[id number]  Patient Care Team: Alfredia Ferguson, PA-C as PCP - General (Physician Assistant) Jim Like, RN as Registered Nurse Rosezetta Schlatter Alessandra Bevels, PA-C as Physician Assistant (Family Medicine) Jeralyn Ruths, MD as Consulting Physician (Oncology) Griselda Miner, MD as Consulting Physician (General Surgery)   CHIEF COMPLAINT: Clinical stage IIa ER/PR positive, HER-2 negative invasive carcinoma of the central portion of the right breast.  INTERVAL HISTORY: Patient returns to clinic today for routine 30-month evaluation.  She continues to feel well and remains asymptomatic.  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  She is tolerating letrozole without significant side effects.  She has no neurologic complaints. She denies any recent fevers or illnesses. She has a good appetite and denies weight loss.  She has no chest pain, shortness of breath, cough, or hemoptysis. She denies any nausea, vomiting, constipation, or diarrhea.  She has no urinary complaints. She has no further joint pain.  Patient offers no specific complaints today.  REVIEW OF SYSTEMS:   Review of Systems  Constitutional: Negative.  Negative for fever, malaise/fatigue and weight loss.  Respiratory: Negative.  Negative for cough, hemoptysis and shortness of breath.   Cardiovascular: Negative.  Negative for chest pain and leg swelling.  Gastrointestinal: Negative.  Negative for abdominal pain, diarrhea and nausea.  Genitourinary: Negative.  Negative for dysuria.  Musculoskeletal: Negative.  Negative for back pain and myalgias.  Skin: Negative.  Negative for rash.  Neurological: Negative.  Negative for focal weakness, weakness and headaches.  Psychiatric/Behavioral: Negative.  The patient is not nervous/anxious.     As per HPI. Otherwise, a complete review of  systems is negative.  PAST MEDICAL HISTORY: Past Medical History:  Diagnosis Date   Allergy    Breast cancer, right (HCC) 2020   Currently taking hormonal chemo tx's.    Depression    Diabetes mellitus without complication (HCC) 2012   Dyspnea    due to weight   Hyperlipidemia    Hypertension    Osteoporosis    Trigeminal neuralgia     PAST SURGICAL HISTORY: Past Surgical History:  Procedure Laterality Date   ABDOMINAL HYSTERECTOMY  1978   BREAST BIOPSY Right 01/06/2019   [place] bx x 2.  Ribbon marker for mass and hydro for Lymph node. Path pending   BREAST CYST EXCISION Right 1986   BREAST EXCISIONAL BIOPSY     IR IMAGING GUIDED PORT INSERTION  05/12/2019   LAMINECTOMY  1995   L1, L2, L3   MASTECTOMY WITH AXILLARY LYMPH NODE DISSECTION Bilateral 11/17/2019   Procedure: BILATERAL MASTECTOMIES WITH RIGHT TARGETED SEED NODE DISSECTION;  Surgeon: Griselda Miner, MD;  Location: [facility];  Service: General;  Laterality: Bilateral;   PORT-A-CATH REMOVAL Left 02/25/2020   Procedure: REMOVAL PORT-A-CATH;  Surgeon: Griselda Miner, MD;  Location: [facility];  Service: General;  Laterality: Left;   REPLACEMENT TOTAL KNEE Left 04/28/2020   REPLACEMENT TOTAL KNEE Right 06/21/2020   SENTINEL NODE BIOPSY Right 11/17/2019   Procedure: RIGHT SENTINEL NODE BIOPSY;  Surgeon: Griselda Miner, MD;  Location: [facility];  Service: General;  Laterality: Right;   SPINE SURGERY      FAMILY HISTORY: Family History  Adopted: Yes  Family history unknown: Yes    ADVANCED DIRECTIVES (Y/N):  N  HEALTH MAINTENANCE: Social History  Tobacco Use   Smoking status: Former    Current packs/day: 0.00    Types: Cigarettes    Quit date: 03/16/2000    Years since quitting: 23.0   Smokeless tobacco: Never  Vaping Use   Vaping status: Never Used  Substance Use Topics   Alcohol use: No   Drug use: No     Colonoscopy:  PAP:  Bone density:  Lipid  panel:  Allergies  Allergen Reactions   Morphine Anaphylaxis    Anaphallaxis.   Morphine And Codeine Anaphylaxis    Current Outpatient Medications  Medication Sig Dispense Refill   alendronate (FOSAMAX) 70 MG tablet TAKE ONE TABLET BY MOUTH ONCE A WEEK ON AN EMPTY STOMACH  WITH A FULL GLASS OF WATER 12 tablet 2   baclofen (LIORESAL) 10 MG tablet Take 1 tablet (10 mg total) by mouth daily. 90 tablet 1   Cholecalciferol (VITAMIN D3) 5000 units CAPS Take by mouth.     letrozole (FEMARA) 2.5 MG tablet Take 1 tablet by mouth once daily 90 tablet 0   lisinopril-hydrochlorothiazide (ZESTORETIC) 10-12.5 MG tablet Take 1 tablet by mouth daily. 90 tablet 1   MULTIPLE VITAMINS PO MULTIVITAMINS (Oral Tablet)  1 Every Day for 0 days  Quantity: 0.00;  Refills: 0   Ordered :17-Apr-2011  Burnell Blanks ;  Started 24-February-2009 Active     OMEGA 3-6-9 FATTY ACIDS PO Reported on 10/04/2015     amLODipine (NORVASC) 5 MG tablet Take 1 tablet (5 mg total) by mouth daily. (Patient not taking: Reported on 04/08/2023) 90 tablet 1   fluticasone (FLONASE) 50 MCG/ACT nasal spray Place 2 sprays into both nostrils daily. (Patient not taking: Reported on 04/08/2023) 16 g 6   loratadine (CLARITIN) 10 MG tablet Take 1 tablet (10 mg total) by mouth daily as needed. (Patient not taking: Reported on 04/08/2023) 90 tablet 1   metFORMIN (GLUCOPHAGE) 1000 MG tablet Take 1 tablet (1,000 mg total) by mouth 2 (two) times daily with a meal. (Patient not taking: Reported on 04/08/2023) 180 tablet 1   simvastatin (ZOCOR) 20 MG tablet Take 1 tablet (20 mg total) by mouth at bedtime. (Patient not taking: Reported on 04/08/2023) 90 tablet 1   Vitamin D, Ergocalciferol, (DRISDOL) 1.25 MG (50000 UNIT) CAPS capsule Take 1 capsule by mouth once a week (Patient not taking: Reported on 04/08/2023) 12 capsule 1   vitamin E 1000 UNIT capsule Take 1,000 Units by mouth daily. (Patient not taking: Reported on 04/08/2023)     No current facility-administered  medications for this visit.   Facility-Administered Medications Ordered in Other Visits  Medication Dose Route Frequency Provider Last Rate Last Admin   heparin lock flush 100 unit/mL  500 Units Intravenous Once Jeralyn Ruths, MD       sodium chloride flush (NS) 0.9 % injection 10 mL  10 mL Intravenous Once Orlie Dakin, Tollie Pizza, MD      Graduate for companion yesterday dog  OBJECTIVE: There were no vitals filed for this visit.    There is no height or weight on file to calculate BMI.    ECOG FS:0 - Asymptomatic  General: Well-developed, well-nourished, no acute distress. Eyes: Pink conjunctiva, anicteric sclera. HEENT: Normocephalic, moist mucous membranes. Breast: Bilateral mastectomy. Lungs: No audible wheezing or coughing. Heart: Regular rate and rhythm. Abdomen: Soft, nontender, no obvious distention. Musculoskeletal: No edema, cyanosis, or clubbing. Neuro: Alert, answering all questions appropriately. Cranial nerves grossly intact. Skin: No rashes or petechiae noted. Psych: Normal affect.  LAB RESULTS:  Lab Results  Component Value Date   NA 140 08/15/2022   K 5.1 08/15/2022   CL 103 08/15/2022   CO2 23 08/15/2022   GLUCOSE 97 08/15/2022   BUN 19 08/15/2022   CREATININE 0.91 08/15/2022   CALCIUM 9.6 08/15/2022   PROT 6.7 08/15/2022   ALBUMIN 4.4 08/15/2022   AST 20 08/15/2022   ALT 15 08/15/2022   ALKPHOS 72 08/15/2022   BILITOT 0.4 08/15/2022   GFRNONAA 82 08/04/2020   GFRAA 95 08/04/2020    Lab Results  Component Value Date   WBC 6.9 08/04/2020   NEUTROABS 2.9 08/04/2020   HGB 12.5 08/04/2020   HCT 39.5 08/04/2020   MCV 79 08/04/2020   PLT 286 08/04/2020     STUDIES: No results found.  ASSESSMENT: Clinical stage IIa ER/PR positive, HER-2 negative invasive carcinoma of the central portion of the right breast.  Oncotype DX score 7.  PLAN:   1. Clinical stage IIa ER/PR positive, HER-2 negative invasive carcinoma of the central portion of the  right breast: Although patient was a clinical stage IIa and typically neoadjuvant chemotherapy is the recommendation, she initially did not wish to pursue aggressive immunosuppressive treatment during the COVID-19 pandemic.  Instead, patient received neoadjuvant letrozole from April 2020 through August 2020.  She then proceeded with neoadjuvant chemotherapy using Adriamycin, Cytoxan with Udenyca support followed by weekly Taxol x12. Patient completed her chemotherapy on September 30, 2019. She subsequently underwent bilateral mastectomy on November 17, 2019. Residual disease was noted in the breast and patient had evidence of micrometastasis in one lymph node. Because of her bilateral mastectomy, she did not require adjuvant XRT. Continue letrozole for a minimum of 5 years through April 2025. Although given her high risk disease, will likely extend treatment 7 to 10 years.  No intervention is needed this time.  Patient will have video-assisted telemedicine visit in 6 months for routine evaluation.   2.  Osteopenia: Bone mineral density on December 14, 2020 reported T score of -1.6 which is improved slightly over 1 year prior where the T score was reported -1.9.  Continue calcium and vitamin D.  Can consider discontinuing Fosamax in the future.  Repeat bone mineral density in March 2023.   3.  COVID positivity: Resolved.  Patient reports repeat testing is negative. It is likely that the groundglass densities seen on her CT scan from March 17, 2019 are residual from her infection.  No intervention is needed at this time. 3.  Thyroid nodule: Biopsy reported as benign.  Likely multinodular goiter. 4.  Left breast nodule: Bilateral mastectomy as above. No evidence of malignancy noted. 5.  Anemia: Resolved.  I spent a total of 20 minutes reviewing chart data, face-to-face evaluation with the patient, counseling and coordination of care as detailed above.   Patient expressed understanding and was in agreement with this  plan. She also understands that She can call clinic at any time with any questions, concerns, or complaints.    Cancer Staging  Cancer of central portion of right female breast [facility]) Staging form: Breast, AJCC 8th Edition - Clinical stage from 01/13/2019: Stage IIA (cT2, cN1, cM0, G1, ER+, PR+, HER2-) - Signed by Jeralyn Ruths, MD on 01/13/2019 Histologic grading system: 3 grade system   Jeralyn Ruths, MD   04/08/2023 3:08 PM

## 2023-04-08 NOTE — Progress Notes (Unsigned)
Productive cough, started about 6 months, progressively worse. Clear phlegm. Denies chest pain and SOB.

## 2023-04-14 ENCOUNTER — Telehealth: Payer: Self-pay | Admitting: Podiatry

## 2023-04-14 NOTE — Telephone Encounter (Signed)
DOS -  04/30/2023  Quintella Reichert OSTEOTOMY RT - 62[address] [place] BUNIONECTOMY RT - [id number]  [employer] EFFECTIVE DATE - 09/17/2019  DED N/A OOP $3300 W/ $3300 REMAINING COINS 0%  PER [place] WEBSITE FOR [employer] CPT CODES 46962 AND [phone] ARE APPROVED FROM 04/30/2023 - 06/30/2023   AUTHORIZATION NUMBER [id number]

## 2023-04-15 ENCOUNTER — Inpatient Hospital Stay: Payer: Medicare PPO | Attending: Oncology | Admitting: Oncology

## 2023-04-15 NOTE — Progress Notes (Signed)
This encounter was created in error - please disregard.

## 2023-05-08 ENCOUNTER — Encounter: Payer: Medicare PPO | Admitting: Podiatry

## 2023-06-17 ENCOUNTER — Inpatient Hospital Stay: Payer: Medicare PPO | Attending: Oncology | Admitting: Oncology

## 2023-06-17 DIAGNOSIS — M85851 Other specified disorders of bone density and structure, right thigh: Secondary | ICD-10-CM

## 2023-06-17 DIAGNOSIS — Z170 Estrogen receptor positive status [ER+]: Secondary | ICD-10-CM | POA: Diagnosis not present

## 2023-06-17 DIAGNOSIS — C50111 Malignant neoplasm of central portion of right female breast: Secondary | ICD-10-CM | POA: Diagnosis not present

## 2023-06-17 NOTE — Progress Notes (Signed)
[facility]  Telephone:(336) [phone] [phone]) [phone]  ID: Nichole [facility] OB: Jan 03, 1951  MR#: [id number]  WCB#:[id number]  Patient Care Team: Jacky Kindle, FNP as PCP - General (Family Medicine) Jim Like, RN as Registered Nurse Rosezetta Schlatter, Alessandra Bevels, PA-C as Physician Assistant (Family Medicine) Jeralyn Ruths, MD as Consulting Physician (Oncology) Griselda Miner, MD as Consulting Physician (General Surgery)  I connected with Nichole [facility] on 06/17/23 at  3:30 PM EDT by video enabled telemedicine visit and verified that I am speaking with the correct person using two identifiers.   I discussed the limitations, risks, security and privacy concerns of performing an evaluation and management service by telemedicine and the availability of in-person appointments. I also discussed with the patient that there may be a patient responsible charge related to this service. The patient expressed understanding and agreed to proceed.   Other persons participating in the visit and their role in the encounter: Patient, MD.  Patient's location: Home. Provider's location: Clinic.  CHIEF COMPLAINT: Clinical stage IIa ER/PR positive, HER-2 negative invasive carcinoma of the central portion of the right breast.  INTERVAL HISTORY: Patient agreed to video assisted telemedicine visit for routine 25-month evaluation.  She is tolerating letrozole well without significant side effects.  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  She continues to remain active.  She has no neurologic complaints. She denies any recent fevers or illnesses. She has a good appetite and denies weight loss.  She has no chest pain, shortness of breath, cough, or hemoptysis. She denies any nausea, vomiting, constipation, or diarrhea.  She has no urinary complaints.  Patient offers no specific complaints today.  REVIEW OF SYSTEMS:   Review of Systems   Constitutional: Negative.  Negative for fever, malaise/fatigue and weight loss.  Respiratory: Negative.  Negative for cough, hemoptysis and shortness of breath.   Cardiovascular: Negative.  Negative for chest pain and leg swelling.  Gastrointestinal: Negative.  Negative for abdominal pain, diarrhea and nausea.  Genitourinary: Negative.  Negative for dysuria.  Musculoskeletal: Negative.  Negative for back pain and myalgias.  Skin: Negative.  Negative for rash.  Neurological: Negative.  Negative for focal weakness, weakness and headaches.  Psychiatric/Behavioral: Negative.  The patient is not nervous/anxious.     As per HPI. Otherwise, a complete review of systems is negative.  PAST MEDICAL HISTORY: Past Medical History:  Diagnosis Date   Allergy    Breast cancer, right (HCC) 2020   Currently taking hormonal chemo tx's.    Depression    Diabetes mellitus without complication (HCC) 2012   Dyspnea    due to weight   Hyperlipidemia    Hypertension    Osteoporosis    Trigeminal neuralgia     PAST SURGICAL HISTORY: Past Surgical History:  Procedure Laterality Date   ABDOMINAL HYSTERECTOMY  1978   BREAST BIOPSY Right 01/06/2019   [place] bx x 2.  Ribbon marker for mass and hydro for Lymph node. Path pending   BREAST CYST EXCISION Right 1986   BREAST EXCISIONAL BIOPSY     IR IMAGING GUIDED PORT INSERTION  05/12/2019   LAMINECTOMY  1995   L1, L2, L3   MASTECTOMY WITH AXILLARY LYMPH NODE DISSECTION Bilateral 11/17/2019   Procedure: BILATERAL MASTECTOMIES WITH RIGHT TARGETED SEED NODE DISSECTION;  Surgeon: Griselda Miner, MD;  Location: [facility];  Service: General;  Laterality: Bilateral;   PORT-A-CATH REMOVAL Left 02/25/2020  Procedure: REMOVAL PORT-A-CATH;  Surgeon: Griselda Miner, MD;  Location: [facility];  Service: General;  Laterality: Left;   REPLACEMENT TOTAL KNEE Left 04/28/2020   REPLACEMENT TOTAL KNEE Right 06/21/2020   SENTINEL NODE BIOPSY Right  11/17/2019   Procedure: RIGHT SENTINEL NODE BIOPSY;  Surgeon: Griselda Miner, MD;  Location: [facility];  Service: General;  Laterality: Right;   SPINE SURGERY      FAMILY HISTORY: Family History  Adopted: Yes  Family history unknown: Yes    ADVANCED DIRECTIVES (Y/N):  N  HEALTH MAINTENANCE: Social History   Tobacco Use   Smoking status: Former    Current packs/day: 0.00    Types: Cigarettes    Quit date: 03/16/2000    Years since quitting: 23.2   Smokeless tobacco: Never  Vaping Use   Vaping status: Never Used  Substance Use Topics   Alcohol use: No   Drug use: No     Colonoscopy:  PAP:  Bone density:  Lipid panel:  Allergies  Allergen Reactions   Morphine Anaphylaxis    Anaphallaxis.   Morphine And Codeine Anaphylaxis    Current Outpatient Medications  Medication Sig Dispense Refill   alendronate (FOSAMAX) 70 MG tablet TAKE ONE TABLET BY MOUTH ONCE A WEEK ON AN EMPTY STOMACH  WITH A FULL GLASS OF WATER 12 tablet 2   baclofen (LIORESAL) 10 MG tablet Take 1 tablet (10 mg total) by mouth daily. 90 tablet 1   Cholecalciferol (VITAMIN D3) 5000 units CAPS Take by mouth.     letrozole (FEMARA) 2.5 MG tablet Take 1 tablet by mouth once daily 90 tablet 0   lisinopril-hydrochlorothiazide (ZESTORETIC) 10-12.5 MG tablet Take 1 tablet by mouth daily. 90 tablet 1   MULTIPLE VITAMINS PO MULTIVITAMINS (Oral Tablet)  1 Every Day for 0 days  Quantity: 0.00;  Refills: 0   Ordered :17-Apr-2011  Burnell Blanks ;  Started 24-February-2009 Active     OMEGA 3-6-9 FATTY ACIDS PO Reported on 10/04/2015     amLODipine (NORVASC) 5 MG tablet Take 1 tablet (5 mg total) by mouth daily. (Patient not taking: Reported on 04/08/2023) 90 tablet 1   fluticasone (FLONASE) 50 MCG/ACT nasal spray Place 2 sprays into both nostrils daily. (Patient not taking: Reported on 04/08/2023) 16 g 6   loratadine (CLARITIN) 10 MG tablet Take 1 tablet (10 mg total) by mouth daily as needed. (Patient not taking:  Reported on 04/08/2023) 90 tablet 1   metFORMIN (GLUCOPHAGE) 1000 MG tablet Take 1 tablet (1,000 mg total) by mouth 2 (two) times daily with a meal. (Patient not taking: Reported on 04/08/2023) 180 tablet 1   simvastatin (ZOCOR) 20 MG tablet Take 1 tablet (20 mg total) by mouth at bedtime. (Patient not taking: Reported on 04/08/2023) 90 tablet 1   Vitamin D, Ergocalciferol, (DRISDOL) 1.25 MG (50000 UNIT) CAPS capsule Take 1 capsule by mouth once a week (Patient not taking: Reported on 04/08/2023) 12 capsule 1   vitamin E 1000 UNIT capsule Take 1,000 Units by mouth daily. (Patient not taking: Reported on 04/08/2023)     No current facility-administered medications for this visit.   Facility-Administered Medications Ordered in Other Visits  Medication Dose Route Frequency Provider Last Rate Last Admin   heparin lock flush 100 unit/mL  500 Units Intravenous Once Jeralyn Ruths, MD       sodium chloride flush (NS) 0.9 % injection 10 mL  10 mL Intravenous Once Jeralyn Ruths, MD  Graduate for companion yesterday dog  OBJECTIVE: There were no vitals filed for this visit.    There is no height or weight on file to calculate BMI.    ECOG FS:0 - Asymptomatic  LAB RESULTS:  Lab Results  Component Value Date   NA 140 08/15/2022   K 5.1 08/15/2022   CL 103 08/15/2022   CO2 23 08/15/2022   GLUCOSE 97 08/15/2022   BUN 19 08/15/2022   CREATININE 0.91 08/15/2022   CALCIUM 9.6 08/15/2022   PROT 6.7 08/15/2022   ALBUMIN 4.4 08/15/2022   AST 20 08/15/2022   ALT 15 08/15/2022   ALKPHOS 72 08/15/2022   BILITOT 0.4 08/15/2022   GFRNONAA 82 08/04/2020   GFRAA 95 08/04/2020    Lab Results  Component Value Date   WBC 6.9 08/04/2020   NEUTROABS 2.9 08/04/2020   HGB 12.5 08/04/2020   HCT 39.5 08/04/2020   MCV 79 08/04/2020   PLT 286 08/04/2020     STUDIES: No results found.  ONCOLOGY HISTORY: Although patient was a clinical stage IIa and typically neoadjuvant chemotherapy is the  recommendation, she initially did not wish to pursue aggressive immunosuppressive treatment during the COVID-19 pandemic.  Instead, patient received neoadjuvant letrozole from April 2020 through August 2020.  She then proceeded with neoadjuvant chemotherapy using Adriamycin, Cytoxan with Udenyca support followed by weekly Taxol x12. Patient completed her chemotherapy on September 30, 2019. She subsequently underwent bilateral mastectomy on November 17, 2019. Residual disease was noted in the breast and patient had evidence of micrometastasis in one lymph node. Because of her bilateral mastectomy, she did not require adjuvant XRT.   ASSESSMENT: Clinical stage IIa ER/PR positive, HER-2 negative invasive carcinoma of the central portion of the right breast.  Oncotype DX score 7.  PLAN:   Clinical stage IIa ER/PR positive, HER-2 negative invasive carcinoma of the central portion of the right breast: See oncology history as above.  Patient does not require additional mammograms given her bilateral mastectomy.  Patient will complete 5 years of letrozole in April 2025, but given her high risk disease, will likely continue treatment through at least April 2027.  No intervention is needed at this time.  Patient will have a video assisted telemedicine visit in April 2025 after she completes her bone mineral density. Osteopenia: Patient's most recent bone mineral density on December 16, 2022 reported T-score of -1.8.  This is relatively unchanged over the past several years where her T-score was reported at -1.6 and -1.9 respectively.  She has been instructed to discontinue Fosamax, but continue calcium and vitamin D supplementation.  Repeat bone mineral density in April 2025 as above.  Thyroid nodule: Biopsy reported as benign.  Likely multinodular goiter.  I provided 20 minutes of face-to-face video visit time during this encounter which included chart review, counseling, and coordination of care as documented  above.    Patient expressed understanding and was in agreement with this plan. She also understands that She can call clinic at any time with any questions, concerns, or complaints.    Cancer Staging  Cancer of central portion of right female breast [facility]) Staging form: Breast, AJCC 8th Edition - Clinical stage from 01/13/2019: Stage IIA (cT2, cN1, cM0, G1, ER+, PR+, HER2-) - Signed by Jeralyn Ruths, MD on 01/13/2019 Histologic grading system: 3 grade system   Jeralyn Ruths, MD   06/17/2023 3:38 PM

## 2023-06-17 NOTE — Progress Notes (Signed)
Patient states that she is doing well with no new questions or concerns for the doctor today.

## 2023-07-02 ENCOUNTER — Other Ambulatory Visit: Payer: Self-pay | Admitting: Oncology

## 2023-07-02 ENCOUNTER — Encounter (INDEPENDENT_AMBULATORY_CARE_PROVIDER_SITE_OTHER): Payer: Self-pay

## 2023-07-02 DIAGNOSIS — Z170 Estrogen receptor positive status [ER+]: Secondary | ICD-10-CM

## 2023-07-02 MED ORDER — LETROZOLE 2.5 MG PO TABS
2.5000 mg | ORAL_TABLET | Freq: Every day | ORAL | 0 refills | Status: DC
Start: 2023-07-02 — End: 2023-09-29

## 2023-08-19 ENCOUNTER — Ambulatory Visit (INDEPENDENT_AMBULATORY_CARE_PROVIDER_SITE_OTHER): Payer: Medicare PPO

## 2023-08-19 VITALS — BP 132/74 | Ht 64.0 in | Wt 178.9 lb

## 2023-08-19 DIAGNOSIS — Z0000 Encounter for general adult medical examination without abnormal findings: Secondary | ICD-10-CM | POA: Diagnosis not present

## 2023-08-19 DIAGNOSIS — Z1211 Encounter for screening for malignant neoplasm of colon: Secondary | ICD-10-CM

## 2023-08-19 NOTE — Patient Instructions (Addendum)
Nichole [facility] , Thank you for taking time to come for your Medicare Wellness Visit. I appreciate your ongoing commitment to your health goals. Please review the following plan we discussed and let me know if I can assist you in the future.   Referrals/Orders/Follow-Ups/Clinician Recommendations: cologuard ordered  This is a list of the screening recommended for you and due dates:  Health Maintenance  Topic Date Due   Eye exam for diabetics  03/07/2017   Hemoglobin A1C  02/13/2023   Complete foot exam   03/27/2023   COVID-19 Vaccine (6 - 2023-24 season) 05/18/2023   Yearly kidney function blood test for diabetes  08/16/2023   Yearly kidney health urinalysis for diabetes  08/16/2023   Cologuard (Stool DNA test)  10/27/2023   Medicare Annual Wellness Visit  08/18/2024   DTaP/Tdap/Td vaccine (2 - Td or Tdap) 04/16/2030   Pneumonia Vaccine  Completed   Flu Shot  Completed   DEXA scan (bone density measurement)  Completed   Hepatitis C Screening  Completed   Zoster (Shingles) Vaccine  Completed   HPV Vaccine  Aged Out   Mammogram  Discontinued    Advanced directives: (ACP Link)Information on Advanced Care Planning can be found at [facility] Advance Health Care Directives Advance Health Care Directives ([email or web address]   Next Medicare Annual Wellness Visit scheduled for next year: Yes    08/24/24 @ 2:30 pm in person

## 2023-08-19 NOTE — Progress Notes (Signed)
Subjective:   Nichole Martinez is a 72 y.o. female who presents for Medicare Annual (Subsequent) preventive examination.  Visit Complete: In person  Cardiac Risk Factors include: advanced age (>49men, >4 women);dyslipidemia;hypertension;obesity (BMI >30kg/m2)     Objective:    Today's Vitals   08/19/23 1406  BP: 132/74  Weight: 178 lb 14.4 oz (81.1 kg)  Height: 5\' 4"  (1.626 m)   Body mass index is 30.71 kg/m.     08/19/2023    2:18 PM 10/04/2022    3:05 PM 08/15/2022   10:42 AM 08/08/2021    1:28 PM 04/05/2021    2:25 PM 12/30/2019    1:15 PM 12/11/2019    5:04 PM  Advanced Directives  Does Patient Have a Medical Advance Directive? No Yes No Yes No Yes Yes  Type of Furniture conservator/restorer;Living will  Healthcare Power of [place];Living will Out of facility DNR (pink MOST or yellow form)  Healthcare Power of [place];Living will  Does patient want to make changes to medical advance directive?    No - Patient declined     Copy of Healthcare Power of Attorney in Chart?    No - copy requested  No - copy requested No - copy requested  Would patient like information on creating a medical advance directive? No - Patient declined  No - Patient declined        Current Medications (verified) Outpatient Encounter Medications as of 08/19/2023  Medication Sig   alendronate (FOSAMAX) 70 MG tablet TAKE ONE TABLET BY MOUTH ONCE A WEEK ON AN EMPTY STOMACH  WITH A FULL GLASS OF WATER   amLODipine (NORVASC) 5 MG tablet Take 1 tablet (5 mg total) by mouth daily.   Cholecalciferol (VITAMIN D3) 5000 units CAPS Take by mouth.   fluticasone (FLONASE) 50 MCG/ACT nasal spray Place 2 sprays into both nostrils daily.   letrozole (FEMARA) 2.5 MG tablet Take 1 tablet (2.5 mg total) by mouth daily.   lisinopril-hydrochlorothiazide (ZESTORETIC) 10-12.5 MG tablet Take 1 tablet by mouth daily.   loratadine (CLARITIN) 10 MG tablet Take 1 tablet (10 mg total) by mouth daily as needed.    MULTIPLE VITAMINS PO MULTIVITAMINS (Oral Tablet)  1 Every Day for 0 days  Quantity: 0.00;  Refills: 0   Ordered :17-Apr-2011  Burnell Blanks ;  Started 24-February-2009 Active   OMEGA 3-6-9 FATTY ACIDS PO Reported on 10/04/2015   simvastatin (ZOCOR) 20 MG tablet Take 1 tablet (20 mg total) by mouth at bedtime.   Vitamin D, Ergocalciferol, (DRISDOL) 1.25 MG (50000 UNIT) CAPS capsule Take 1 capsule by mouth once a week   vitamin E 1000 UNIT capsule Take 1,000 Units by mouth daily.   baclofen (LIORESAL) 10 MG tablet Take 1 tablet (10 mg total) by mouth daily. (Patient not taking: Reported on 08/19/2023)   metFORMIN (GLUCOPHAGE) 1000 MG tablet Take 1 tablet (1,000 mg total) by mouth 2 (two) times daily with a meal. (Patient not taking: Reported on 04/08/2023)   Facility-Administered Encounter Medications as of 08/19/2023  Medication   heparin lock flush 100 unit/mL   sodium chloride flush (NS) 0.9 % injection 10 mL    Allergies (verified) Morphine and Morphine and codeine   History: Past Medical History:  Diagnosis Date   Allergy    Breast cancer, right (HCC) 2020   Currently taking hormonal chemo tx's.    Depression    Diabetes mellitus without complication (HCC) 2012   Dyspnea    due  to weight   Hyperlipidemia    Hypertension    Osteoporosis    Trigeminal neuralgia    Past Surgical History:  Procedure Laterality Date   ABDOMINAL HYSTERECTOMY  1978   BREAST BIOPSY Right 01/06/2019   [place] bx x 2.  Ribbon marker for mass and hydro for Lymph node. Path pending   BREAST CYST EXCISION Right 1986   BREAST EXCISIONAL BIOPSY     IR IMAGING GUIDED PORT INSERTION  05/12/2019   LAMINECTOMY  1995   L1, L2, L3   MASTECTOMY WITH AXILLARY LYMPH NODE DISSECTION Bilateral 11/17/2019   Procedure: BILATERAL MASTECTOMIES WITH RIGHT TARGETED SEED NODE DISSECTION;  Surgeon: Griselda Miner, MD;  Location: [facility];  Service: General;  Laterality: Bilateral;   PORT-A-CATH REMOVAL Left 02/25/2020    Procedure: REMOVAL PORT-A-CATH;  Surgeon: Griselda Miner, MD;  Location: [facility];  Service: General;  Laterality: Left;   REPLACEMENT TOTAL KNEE Left 04/28/2020   REPLACEMENT TOTAL KNEE Right 06/21/2020   SENTINEL NODE BIOPSY Right 11/17/2019   Procedure: RIGHT SENTINEL NODE BIOPSY;  Surgeon: Griselda Miner, MD;  Location: [facility];  Service: General;  Laterality: Right;   SPINE SURGERY     Family History  Adopted: Yes  Family history unknown: Yes   Social History   Socioeconomic History   Marital status: Divorced    Spouse name: Not on file   Number of children: 1   Years of education: Not on file   Highest education level: Not on file  Occupational History   Occupation: Teacher, adult education: [employer]  Tobacco Use   Smoking status: Former    Current packs/day: 0.00    Types: Cigarettes    Quit date: 03/16/2000    Years since quitting: 23.4   Smokeless tobacco: Never  Vaping Use   Vaping status: Never Used  Substance and Sexual Activity   Alcohol use: No   Drug use: No   Sexual activity: Not on file  Other Topics Concern   Not on file  Social History Narrative   Not on file   Social Determinants of Health   Financial Resource Strain: Low Risk  (08/19/2023)   Overall Financial Resource Strain (CARDIA)    Difficulty of Paying Living Expenses: Not hard at all  Food Insecurity: No Food Insecurity (08/19/2023)   Hunger Vital Sign    Worried About Running Out of Food in the Last Year: Never true    Ran Out of Food in the Last Year: Never true  Transportation Needs: No Transportation Needs (08/19/2023)   PRAPARE - Administrator, Civil Service (Medical): No    Lack of Transportation (Non-Medical): No  Physical Activity: Sufficiently Active (08/19/2023)   Exercise Vital Sign    Days of Exercise per Week: 3 days    Minutes of Exercise per Session: 60 min  Stress: No Stress Concern Present (08/19/2023)   [employer] of  Occupational Health - Occupational Stress Questionnaire    Feeling of Stress : Not at all  Social Connections: Socially Isolated (08/19/2023)   Social Connection and Isolation Panel [NHANES]    Frequency of Communication with Friends and Family: More than three times a week    Frequency of Social Gatherings with Friends and Family: More than three times a week    Attends Religious Services: Never    Database administrator or Organizations: No    Attends Banker  Meetings: Never    Marital Status: Divorced    Tobacco Counseling Counseling given: Not Answered   Clinical Intake:  Pre-visit preparation completed: Yes  Pain : No/denies pain     BMI - recorded: 30.71 Nutritional Status: BMI > 30  Obese Nutritional Risks: None Diabetes: No  How often do you need to have someone help you when you read instructions, pamphlets, or other written materials from your doctor or pharmacy?: 1 - Never  Interpreter Needed?: No  Information entered by :: Kennedy Bucker, LPN   Activities of Daily Living    08/19/2023    2:19 PM  In your present state of health, do you have any difficulty performing the following activities:  Hearing? 0  Vision? 0  Difficulty concentrating or making decisions? 0  Walking or climbing stairs? 0  Dressing or bathing? 0  Doing errands, shopping? 0  Preparing Food and eating ? N  Using the Toilet? N  In the past six months, have you accidently leaked urine? N  Do you have problems with loss of bowel control? N  Managing your Medications? N  Managing your Finances? N  Housekeeping or managing your Housekeeping? N    Patient Care Team: Jacky Kindle, FNP as PCP - General (Family Medicine) Jim Like, RN as Registered Nurse Rosezetta Schlatter, Alessandra Bevels, PA-C as Physician Assistant (Family Medicine) Jeralyn Ruths, MD as Consulting Physician (Oncology) Griselda Miner, MD as Consulting Physician (General Surgery) Pa, [facility]  (Optometry)  Indicate any recent Medical Services you may have received from other than [facility] providers in the past year (date may be approximate).     Assessment:   This is a routine wellness examination for Miylah.  Hearing/Vision screen Hearing Screening - Comments:: No aids Vision Screening - Comments:: Wears glasses- [facility]   Goals Addressed             This Visit's Progress    DIET - INCREASE WATER INTAKE         Depression Screen    08/19/2023    2:15 PM 08/15/2022   10:38 AM 08/15/2022    9:57 AM 08/08/2021    1:25 PM 08/04/2020    1:17 PM 08/04/2019   11:08 AM 05/07/2018    9:06 AM  PHQ 2/9 Scores  PHQ - 2 Score 0 0 0 0 0 0 0  PHQ- 9 Score 0    2  0    Fall Risk    08/19/2023    2:18 PM 08/15/2022   10:42 AM 08/15/2022    9:56 AM 08/08/2021    1:30 PM 08/07/2021   12:44 PM  Fall Risk   Falls in the past year? 0 0 0 0 1  Number falls in past yr: 0 0 0 0 1  Injury with Fall? 0 0 0 0 0  Risk for fall due to : No Fall Risks No Fall Risks  No Fall Risks   Follow up Falls prevention discussed;Falls evaluation completed Falls prevention discussed;Falls evaluation completed  Falls prevention discussed     MEDICARE RISK AT HOME: Medicare Risk at Home Any stairs in or around the home?: Yes If so, are there any without handrails?: No Home free of loose throw rugs in walkways, pet beds, electrical cords, etc?: Yes Adequate lighting in your home to reduce risk of falls?: Yes Life alert?: No Use of a cane, walker or w/c?: No Grab bars in the bathroom?: No Shower chair or bench  in shower?: No Elevated toilet seat or a handicapped toilet?: Yes  TIMED UP AND GO:  Was the test performed?  Yes  Length of time to ambulate 10 feet: 4 sec Gait steady and fast without use of assistive device    Cognitive Function:        08/19/2023    2:21 PM 08/15/2022   10:55 AM 09/19/2021    9:48 AM  6CIT Screen  What Year? 0 points 0 points 0 points  What month? 0  points 0 points 0 points  What time? 0 points 0 points 0 points  Count back from 20 0 points 0 points 0 points  Months in reverse 0 points 0 points 0 points  Repeat phrase 4 points 0 points 2 points  Total Score 4 points 0 points 2 points    Immunizations Immunization History  Administered Date(s) Administered   Fluad Quad(high Dose 65+) 06/04/2021   Influenza Split 06/19/2023   Influenza,inj,Quad PF,6+ Mos 06/17/2015   Influenza-Unspecified 06/17/2015, 06/16/2020, 05/19/2022   MODERNA COVID-19 SARS-COV-2 PEDS BIVALENT BOOSTER 43yr-60yr 02/15/2021, 06/04/2021   Moderna Sars-Covid-2 Vaccination 12/15/2019, 01/12/2020, 08/15/2020   PNEUMOCOCCAL CONJUGATE-20 06/04/2021   Pneumococcal-Unspecified 08/16/2020   Tdap 04/16/2020   Zoster Recombinant(Shingrix) 08/16/2020, 10/17/2020, 12/12/2020    TDAP status: Up to date  Flu Vaccine status: Up to date  Pneumococcal vaccine status: Up to date  Covid-19 vaccine status: Completed vaccines  Qualifies for Shingles Vaccine? Yes   Zostavax completed No   Shingrix Completed?: Yes  Screening Tests Health Maintenance  Topic Date Due   OPHTHALMOLOGY EXAM  03/07/2017   HEMOGLOBIN A1C  02/13/2023   FOOT EXAM  03/27/2023   COVID-19 Vaccine (6 - 2023-24 season) 05/18/2023   Diabetic kidney evaluation - eGFR measurement  08/16/2023   Diabetic kidney evaluation - Urine ACR  08/16/2023   Fecal DNA (Cologuard)  10/27/2023   Medicare Annual Wellness (AWV)  08/18/2024   DTaP/Tdap/Td (2 - Td or Tdap) 04/16/2030   Pneumonia Vaccine 21+ Years old  Completed   INFLUENZA VACCINE  Completed   DEXA SCAN  Completed   Hepatitis C Screening  Completed   Zoster Vaccines- Shingrix  Completed   HPV VACCINES  Aged Out   MAMMOGRAM  Discontinued    Health Maintenance  Health Maintenance Due  Topic Date Due   OPHTHALMOLOGY EXAM  03/07/2017   HEMOGLOBIN A1C  02/13/2023   FOOT EXAM  03/27/2023   COVID-19 Vaccine (6 - 2023-24 season) 05/18/2023    Diabetic kidney evaluation - eGFR measurement  08/16/2023   Diabetic kidney evaluation - Urine ACR  08/16/2023    Colorectal cancer screening: Type of screening: Cologuard. Completed 10/26/20. Repeat every 3 years- cologuard ordered  Mammogram status: No longer required due to double mastectomy.  Bone Density status: Completed SCHEDULED 12/18/23. Results reflect: Bone density results: OSTEOPENIA. Repeat every 3-5 years.  Lung Cancer Screening: (Low Dose CT Chest recommended if Age 56-80 years, 20 pack-year currently smoking OR have quit w/in 15years.) does not qualify.    Additional Screening:  Hepatitis C Screening: does qualify; Completed 05/06/17  Vision Screening: Recommended annual ophthalmology exams for early detection of glaucoma and other disorders of the eye. Is the patient up to date with their annual eye exam?  Yes  Who is the provider or what is the name of the office in which the patient attends annual eye exams? [facility] If pt is not established with a provider, would they like to be referred to a provider  to establish care? No .   Dental Screening: Recommended annual dental exams for proper oral hygiene   Community Resource Referral / Chronic Care Management: CRR required this visit?  No   CCM required this visit?  No     Plan:     I have personally reviewed and noted the following in the patient's chart:   Medical and social history Use of alcohol, tobacco or illicit drugs  Current medications and supplements including opioid prescriptions. Patient is not currently taking opioid prescriptions. Functional ability and status Nutritional status Physical activity Advanced directives List of other physicians Hospitalizations, surgeries, and ER visits in previous 12 months Vitals Screenings to include cognitive, depression, and falls Referrals and appointments  In addition, I have reviewed and discussed with patient certain preventive protocols, quality  metrics, and best practice recommendations. A written personalized care plan for preventive services as well as general preventive health recommendations were provided to patient.     Hal Hope, LPN   44/0/1027   After Visit Summary: (In Person-Declined) Patient declined AVS at this time.  Nurse Notes: none

## 2023-08-26 ENCOUNTER — Ambulatory Visit (INDEPENDENT_AMBULATORY_CARE_PROVIDER_SITE_OTHER): Payer: Medicare PPO | Admitting: Family Medicine

## 2023-08-26 ENCOUNTER — Encounter: Payer: Self-pay | Admitting: Family Medicine

## 2023-08-26 VITALS — BP 134/80 | HR 70 | Wt 178.0 lb

## 2023-08-26 DIAGNOSIS — E559 Vitamin D deficiency, unspecified: Secondary | ICD-10-CM

## 2023-08-26 DIAGNOSIS — E1169 Type 2 diabetes mellitus with other specified complication: Secondary | ICD-10-CM

## 2023-08-26 DIAGNOSIS — M8589 Other specified disorders of bone density and structure, multiple sites: Secondary | ICD-10-CM | POA: Diagnosis not present

## 2023-08-26 DIAGNOSIS — E1159 Type 2 diabetes mellitus with other circulatory complications: Secondary | ICD-10-CM

## 2023-08-26 DIAGNOSIS — Z0000 Encounter for general adult medical examination without abnormal findings: Secondary | ICD-10-CM | POA: Diagnosis not present

## 2023-08-26 DIAGNOSIS — Z1239 Encounter for other screening for malignant neoplasm of breast: Secondary | ICD-10-CM | POA: Diagnosis not present

## 2023-08-26 DIAGNOSIS — Z853 Personal history of malignant neoplasm of breast: Secondary | ICD-10-CM

## 2023-08-26 DIAGNOSIS — I152 Hypertension secondary to endocrine disorders: Secondary | ICD-10-CM

## 2023-08-26 DIAGNOSIS — E785 Hyperlipidemia, unspecified: Secondary | ICD-10-CM

## 2023-08-26 MED ORDER — ALENDRONATE SODIUM 70 MG PO TABS
70.0000 mg | ORAL_TABLET | ORAL | 3 refills | Status: AC
Start: 2023-08-26 — End: ?

## 2023-08-26 MED ORDER — AMLODIPINE BESYLATE 5 MG PO TABS
5.0000 mg | ORAL_TABLET | Freq: Every day | ORAL | 3 refills | Status: AC
Start: 2023-08-26 — End: ?

## 2023-08-26 MED ORDER — LISINOPRIL-HYDROCHLOROTHIAZIDE 10-12.5 MG PO TABS
1.0000 | ORAL_TABLET | Freq: Every day | ORAL | 3 refills | Status: AC
Start: 2023-08-26 — End: ?

## 2023-08-26 NOTE — Patient Instructions (Signed)
Please call and schedule your mammogram:  [facility]  [address], [address] [facility] [place],  [place]  [address]  Main: [phone]

## 2023-08-26 NOTE — Assessment & Plan Note (Signed)
Chronic, LDL goal remains <70 Repeat LP The 10-year ASCVD risk score (Arnett DK, et al., 2019) is: 20.4% On zocor 20 mg recommend diet low in saturated fat and regular exercise - 30 min at least 5 times per week

## 2023-08-26 NOTE — Assessment & Plan Note (Signed)
Encourage repeat mammo Due for screening for mammogram, denies breast concerns, provided with phone number to call and schedule appointment for mammogram. Encouraged to repeat breast cancer screening every 1-2 years.

## 2023-08-26 NOTE — Assessment & Plan Note (Signed)
Chronic, remains elevated/borderline Goal remains 119/79 Previously prescribed combination of norvasc 5 mg with low dose zestoretic 10-12.5

## 2023-08-26 NOTE — Assessment & Plan Note (Signed)
Chronic, x 10 years Controlled with use of metformin Repeat A1c and urine micro albumin Goal remains A1c <7% Upcoming DM eye exam On ACEi and statin Continue to recommend balanced, lower carb meals. Smaller meal size, adding snacks. Choosing water as drink of choice and increasing purposeful exercise.

## 2023-08-26 NOTE — Assessment & Plan Note (Signed)
Chronic, remains on fosamax weekly to assist diet/exercise Repeat Vit D

## 2023-08-26 NOTE — Progress Notes (Signed)
Complete physical exam   Patient: Nichole Martinez   DOB: December 28, 1950   72 y.o. Female  MRN: [id number] Visit Date: 08/26/2023  Today's healthcare provider: Jacky Kindle, FNP  Introduced to nurse practitioner role and practice setting.  All questions answered.  Discussed provider/patient relationship and expectations.  Chief Complaint  Patient presents with   Annual Exam   Subjective    Nichole Martinez is a 72 y.o. female who presents today for a complete physical exam.  She reports consuming a low fat and increased vegetables/salads  diet. The patient has a physically strenuous job, but has no regular exercise apart from work.  She generally feels fairly well. She reports sleeping fairly well. She does have additional problems to discuss today.  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 Close to family and continues to support her grandson who is in grad school. Reports she is followed by oncology given hx of breast cancer and continues to feel great despite her upcoming 72th birthday.  Past Medical History:  Diagnosis Date   Allergy    Breast cancer, right (HCC) 2020   Currently taking hormonal chemo tx's.    Depression    Diabetes mellitus without complication (HCC) 2012   Dyspnea    due to weight   Hyperlipidemia    Hypertension    Osteoporosis    Trigeminal neuralgia    Past Surgical History:  Procedure Laterality Date   ABDOMINAL HYSTERECTOMY  1978   BREAST BIOPSY Right 01/06/2019   [place] bx x 2.  Ribbon marker for mass and hydro for Lymph node. Path pending   BREAST CYST EXCISION Right 1986   BREAST EXCISIONAL BIOPSY     IR IMAGING GUIDED PORT INSERTION  05/12/2019   LAMINECTOMY  1995   L1, L2, L3   MASTECTOMY WITH AXILLARY LYMPH NODE DISSECTION Bilateral 11/17/2019   Procedure: BILATERAL MASTECTOMIES WITH RIGHT TARGETED SEED NODE DISSECTION;  Surgeon: Griselda Miner, MD;  Location: [facility];  Service:  General;  Laterality: Bilateral;   PORT-A-CATH REMOVAL Left 02/25/2020   Procedure: REMOVAL PORT-A-CATH;  Surgeon: Griselda Miner, MD;  Location: [facility];  Service: General;  Laterality: Left;   REPLACEMENT TOTAL KNEE Left 04/28/2020   REPLACEMENT TOTAL KNEE Right 06/21/2020   SENTINEL NODE BIOPSY Right 11/17/2019   Procedure: RIGHT SENTINEL NODE BIOPSY;  Surgeon: Griselda Miner, MD;  Location:  SURGERY CENTER;  Service: General;  Laterality: Right;   SPINE SURGERY     Social History   Socioeconomic History   Marital status: Divorced    Spouse name: Not on file   Number of children: 1   Years of education: Not on file   Highest education level: Not on file  Occupational History   Occupation: RN    Employer: [employer]  Tobacco Use   Smoking status: Former    Current packs/day: 0.00    Types: Cigarettes    Quit date: 03/16/2000    Years since quitting: 23.4   Smokeless tobacco: Never  Vaping Use   Vaping status: Never Used  Substance and Sexual Activity   Alcohol use: No   Drug use: No   Sexual activity: Not on file  Other Topics Concern   Not on file  Social History Narrative   Not on file   Social Determinants of Health   Financial Resource Strain: Low  Risk  (08/19/2023)   Overall Financial Resource Strain (CARDIA)    Difficulty of Paying Living Expenses: Not hard at all  Food Insecurity: No Food Insecurity (08/19/2023)   Hunger Vital Sign    Worried About Running Out of Food in the Last Year: Never true    Ran Out of Food in the Last Year: Never true  Transportation Needs: No Transportation Needs (08/19/2023)   PRAPARE - Administrator, Civil Service (Medical): No    Lack of Transportation (Non-Medical): No  Physical Activity: Sufficiently Active (08/19/2023)   Exercise Vital Sign    Days of Exercise per Week: 3 days    Minutes of Exercise per Session: 60 min  Stress: No Stress Concern Present (08/19/2023)   [employer]  of Occupational Health - Occupational Stress Questionnaire    Feeling of Stress : Not at all  Social Connections: Socially Isolated (08/19/2023)   Social Connection and Isolation Panel [NHANES]    Frequency of Communication with Friends and Family: More than three times a week    Frequency of Social Gatherings with Friends and Family: More than three times a week    Attends Religious Services: Never    Database administrator or Organizations: No    Attends Banker Meetings: Never    Marital Status: Divorced  Catering manager Violence: Not At Risk (08/19/2023)   Humiliation, Afraid, Rape, and Kick questionnaire    Fear of Current or Ex-Partner: No    Emotionally Abused: No    Physically Abused: No    Sexually Abused: No   No family status information on file.   Family History  Adopted: Yes  Family history unknown: Yes   Allergies  Allergen Reactions   Morphine Anaphylaxis    Anaphallaxis.   Morphine And Codeine Anaphylaxis    Patient Care Team: Jacky Kindle, FNP as PCP - General (Family Medicine) Jim Like, RN as Registered Nurse Rosezetta Schlatter, Alessandra Bevels, PA-C as Physician Assistant (Family Medicine) Jeralyn Ruths, MD as Consulting Physician (Oncology) Griselda Miner, MD as Consulting Physician (General Surgery) Pa, [facility] (Optometry)   Medications: Outpatient Medications Prior to Visit  Medication Sig   baclofen (LIORESAL) 10 MG tablet Take 1 tablet (10 mg total) by mouth daily.   Cholecalciferol (VITAMIN D3) 5000 units CAPS Take by mouth.   fluticasone (FLONASE) 50 MCG/ACT nasal spray Place 2 sprays into both nostrils daily.   letrozole (FEMARA) 2.5 MG tablet Take 1 tablet (2.5 mg total) by mouth daily.   loratadine (CLARITIN) 10 MG tablet Take 1 tablet (10 mg total) by mouth daily as needed.   metFORMIN (GLUCOPHAGE) 1000 MG tablet Take 1 tablet (1,000 mg total) by mouth 2 (two) times daily with a meal.   MULTIPLE VITAMINS PO  MULTIVITAMINS (Oral Tablet)  1 Every Day for 0 days  Quantity: 0.00;  Refills: 0   Ordered :17-Apr-2011  Burnell Blanks ;  Started 24-February-2009 Active   OMEGA 3-6-9 FATTY ACIDS PO Reported on 10/04/2015   simvastatin (ZOCOR) 20 MG tablet Take 1 tablet (20 mg total) by mouth at bedtime.   Vitamin D, Ergocalciferol, (DRISDOL) 1.25 MG (50000 UNIT) CAPS capsule Take 1 capsule by mouth once a week   vitamin E 1000 UNIT capsule Take 1,000 Units by mouth daily.   [DISCONTINUED] alendronate (FOSAMAX) 70 MG tablet TAKE ONE TABLET BY MOUTH ONCE A WEEK ON AN EMPTY STOMACH  WITH A FULL GLASS OF WATER   [DISCONTINUED] amLODipine (  NORVASC) 5 MG tablet Take 1 tablet (5 mg total) by mouth daily.   [DISCONTINUED] lisinopril-hydrochlorothiazide (ZESTORETIC) 10-12.5 MG tablet Take 1 tablet by mouth daily.   Facility-Administered Medications Prior to Visit  Medication Dose Route Frequency Provider   heparin lock flush 100 unit/mL  500 Units Intravenous Once Orlie Dakin, Tollie Pizza, MD   sodium chloride flush (NS) 0.9 % injection 10 mL  10 mL Intravenous Once Jeralyn Ruths, MD    Review of Systems    Objective    BP 134/80 (BP Location: Right Arm, Patient Position: Sitting, Cuff Size: Normal)   Pulse 70   Wt 178 lb (80.7 kg)   SpO2 98%   BMI 30.55 kg/m    Physical Exam Vitals and nursing note reviewed.  Constitutional:      General: She is awake. She is not in acute distress.    Appearance: Normal appearance. She is well-developed and well-groomed. She is obese. She is not ill-appearing, toxic-appearing or diaphoretic.  HENT:     Head: Normocephalic and atraumatic.     Jaw: There is normal jaw occlusion. No trismus, tenderness, swelling or pain on movement.     Right Ear: Hearing, tympanic membrane, ear canal and external ear normal. There is no impacted cerumen.     Left Ear: Hearing, tympanic membrane, ear canal and external ear normal. There is no impacted cerumen.     Nose: Nose normal. No  congestion or rhinorrhea.     Right Turbinates: Not enlarged, swollen or pale.     Left Turbinates: Not enlarged, swollen or pale.     Right Sinus: No maxillary sinus tenderness or frontal sinus tenderness.     Left Sinus: No maxillary sinus tenderness or frontal sinus tenderness.     Mouth/Throat:     Lips: Pink.     Mouth: Mucous membranes are moist. No injury.     Tongue: No lesions.     Pharynx: Oropharynx is clear. Uvula midline. No pharyngeal swelling, oropharyngeal exudate, posterior oropharyngeal erythema or uvula swelling.     Tonsils: No tonsillar exudate or tonsillar abscesses.  Eyes:     General: Lids are normal. Lids are everted, no foreign bodies appreciated. Vision grossly intact. Gaze aligned appropriately. No allergic shiner or visual field deficit.       Right eye: No discharge.        Left eye: No discharge.     Extraocular Movements: Extraocular movements intact.     Conjunctiva/sclera: Conjunctivae normal.     Right eye: Right conjunctiva is not injected. No exudate.    Left eye: Left conjunctiva is not injected. No exudate.    Pupils: Pupils are equal, round, and reactive to light.  Neck:     Thyroid: No thyroid mass, thyromegaly or thyroid tenderness.     Vascular: No carotid bruit.     Trachea: Trachea normal.  Cardiovascular:     Rate and Rhythm: Normal rate and regular rhythm.     Pulses: Normal pulses.          Carotid pulses are 2+ on the right side and 2+ on the left side.      Radial pulses are 2+ on the right side and 2+ on the left side.       Dorsalis pedis pulses are 2+ on the right side and 2+ on the left side.       Posterior tibial pulses are 2+ on the right side and 2+ on the left side.  Heart sounds: Normal heart sounds, S1 normal and S2 normal. No murmur heard.    No friction rub. No gallop.  Pulmonary:     Effort: Pulmonary effort is normal. No respiratory distress.     Breath sounds: Normal breath sounds and air entry. No stridor. No  wheezing, rhonchi or rales.  Chest:     Chest wall: No tenderness.  Abdominal:     General: Abdomen is flat. Bowel sounds are normal. There is no distension.     Palpations: Abdomen is soft. There is no mass.     Tenderness: There is no abdominal tenderness. There is no right CVA tenderness, left CVA tenderness, guarding or rebound.     Hernia: No hernia is present.  Genitourinary:    Comments: Exam deferred; endorses rare urge incontinence with large sneeze/cough. Self limiting at this time.  Also reports intermittent rectal-vaginal wall weakness; exacerbated by constipation.  Musculoskeletal:        General: No swelling, tenderness, deformity or signs of injury. Normal range of motion.     Cervical back: Full passive range of motion without pain, normal range of motion and neck supple. No edema, rigidity or tenderness. No muscular tenderness.     Right lower leg: No edema.     Left lower leg: No edema.  Lymphadenopathy:     Cervical: No cervical adenopathy.     Right cervical: No superficial, deep or posterior cervical adenopathy.    Left cervical: No superficial, deep or posterior cervical adenopathy.  Skin:    General: Skin is warm and dry.     Capillary Refill: Capillary refill takes less than 2 seconds.     Coloration: Skin is not jaundiced or pale.     Findings: No bruising, erythema, lesion or rash.  Neurological:     General: No focal deficit present.     Mental Status: She is alert and oriented to person, place, and time. Mental status is at baseline.     GCS: GCS eye subscore is 4. GCS verbal subscore is 5. GCS motor subscore is 6.     Sensory: Sensation is intact. No sensory deficit.     Motor: Motor function is intact. No weakness.     Coordination: Coordination is intact. Coordination normal.     Gait: Gait is intact. Gait normal.  Psychiatric:        Attention and Perception: Attention and perception normal.        Mood and Affect: Mood and affect normal.         Speech: Speech normal.        Behavior: Behavior normal. Behavior is cooperative.        Thought Content: Thought content normal.        Cognition and Memory: Cognition and memory normal.        Judgment: Judgment normal.     Last depression screening scores    08/26/2023    1:40 PM 08/19/2023    2:15 PM 08/15/2022   10:38 AM  PHQ 2/9 Scores  PHQ - 2 Score 0 0 0  PHQ- 9 Score 1 0    Last fall risk screening    08/19/2023    2:18 PM  Fall Risk   Falls in the past year? 0  Number falls in past yr: 0  Injury with Fall? 0  Risk for fall due to : No Fall Risks  Follow up Falls prevention discussed;Falls evaluation completed   Last Audit-C alcohol use screening  08/19/2023    2:14 PM  Alcohol Use Disorder Test (AUDIT)  1. How often do you have a drink containing alcohol? 0  2. How many drinks containing alcohol do you have on a typical day when you are drinking? 0  3. How often do you have six or more drinks on one occasion? 0  AUDIT-C Score 0   A score of 3 or more in women, and 4 or more in men indicates increased risk for alcohol abuse, EXCEPT if all of the points are from question 1   No results found for any visits on 08/26/23.  Assessment & Plan    Routine Health Maintenance and Physical Exam  Exercise Activities and Dietary recommendations  Goals      DIET - EAT MORE FRUITS AND VEGETABLES     DIET - INCREASE WATER INTAKE     Weight (lb) < 200 lb (90.7 kg)     Like to lose 10lb        Immunization History  Administered Date(s) Administered   Fluad Quad(high Dose 65+) 06/04/2021   Influenza Split 06/19/2023   Influenza,inj,Quad PF,6+ Mos 06/17/2015   Influenza-Unspecified 06/17/2015, 06/16/2020, 05/19/2022   MODERNA COVID-19 SARS-COV-2 PEDS BIVALENT BOOSTER 38yr-26yr 02/15/2021, 06/04/2021   Moderna Sars-Covid-2 Vaccination 12/15/2019, 01/12/2020, 08/15/2020   PNEUMOCOCCAL CONJUGATE-20 06/04/2021   Pneumococcal-Unspecified 08/16/2020   Tdap 04/16/2020    Zoster Recombinant(Shingrix) 08/16/2020, 10/17/2020, 12/12/2020    Health Maintenance  Topic Date Due   OPHTHALMOLOGY EXAM  03/07/2017   HEMOGLOBIN A1C  02/13/2023   FOOT EXAM  03/27/2023   COVID-19 Vaccine (6 - 2023-24 season) 05/18/2023   Diabetic kidney evaluation - eGFR measurement  08/16/2023   Diabetic kidney evaluation - Urine ACR  08/16/2023   Fecal DNA (Cologuard)  10/27/2023   Medicare Annual Wellness (AWV)  08/18/2024   DTaP/Tdap/Td (2 - Td or Tdap) 04/16/2030   Pneumonia Vaccine 58+ Years old  Completed   INFLUENZA VACCINE  Completed   DEXA SCAN  Completed   Hepatitis C Screening  Completed   Zoster Vaccines- Shingrix  Completed   HPV VACCINES  Aged Out   MAMMOGRAM  Discontinued    Discussed health benefits of physical activity, and encouraged her to engage in regular exercise appropriate for her age and condition.  Problem List Items Addressed This Visit       Cardiovascular and Mediastinum   Hypertension associated with diabetes (HCC)    Chronic, remains elevated/borderline Goal remains 119/79 Previously prescribed combination of norvasc 5 mg with low dose zestoretic 10-12.5      Relevant Medications   amLODipine (NORVASC) 5 MG tablet   lisinopril-hydrochlorothiazide (ZESTORETIC) 10-12.5 MG tablet   Other Relevant Orders   CBC with Differential/Platelet   Comprehensive Metabolic Panel (CMET)   TSH     Endocrine   Diabetes mellitus, type 2 (HCC)    Chronic, x 10 years Controlled with use of metformin Repeat A1c and urine micro albumin Goal remains A1c <7% Upcoming DM eye exam On ACEi and statin Continue to recommend balanced, lower carb meals. Smaller meal size, adding snacks. Choosing water as drink of choice and increasing purposeful exercise.       Relevant Medications   lisinopril-hydrochlorothiazide (ZESTORETIC) 10-12.5 MG tablet   Hyperlipidemia associated with type 2 diabetes mellitus (HCC)    Chronic, LDL goal remains <70 Repeat  LP The 10-year ASCVD risk score (Arnett DK, et al., 2019) is: 20.4% On zocor 20 mg recommend diet low in saturated fat and regular  exercise - 30 min at least 5 times per week       Relevant Medications   amLODipine (NORVASC) 5 MG tablet   lisinopril-hydrochlorothiazide (ZESTORETIC) 10-12.5 MG tablet   Other Relevant Orders   Hemoglobin A1c   Lipid panel     Musculoskeletal and Integument   Osteopenia of multiple sites    Chronic, remains on fosamax weekly to assist diet/exercise Repeat Vit D       Relevant Medications   alendronate (FOSAMAX) 70 MG tablet   Other Relevant Orders   Vitamin D (25 hydroxy)     Other   Annual physical exam - Primary    Things to do to keep yourself healthy  - Exercise at least 30-45 minutes a day, 3-4 days a week.  - Eat a low-fat diet with lots of fruits and vegetables, up to 7-9 servings per day.  - Seatbelts can save your life. Wear them always.  - Smoke detectors on every level of your home, check batteries every year.  - Eye Doctor - have an eye exam every 1-2 years  - Safe sex - if you may be exposed to STDs, use a condom.  - Alcohol -  If you drink, do it moderately, less than 2 drinks per day.  - Health Care Power of Attorney. Choose someone to speak for you if you are not able.  - Depression is common in our stressful world.If you're feeling down or losing interest in things you normally enjoy, please come in for a visit.  - Violence - If anyone is threatening or hurting you, please call immediately.       Relevant Orders   CBC with Differential/Platelet   Comprehensive Metabolic Panel (CMET)   TSH   Hemoglobin A1c   Lipid panel   Urine Microalbumin w/creat. ratio   Avitaminosis D    Chronic, on OTC supplement Repeat labs to further assist known OP      Relevant Orders   Vitamin D (25 hydroxy)   History of breast cancer    Encourage repeat mammo Due for screening for mammogram, denies breast concerns, provided with phone  number to call and schedule appointment for mammogram. Encouraged to repeat breast cancer screening every 1-2 years.       Screening breast examination    Encourage repeat mammo Due for screening for mammogram, denies breast concerns, provided with phone number to call and schedule appointment for mammogram. Encouraged to repeat breast cancer screening every 1-2 years.       Return in about 6 months (around 02/24/2024) for T2DM management.    Leilani Merl, FNP, have reviewed all documentation for this visit. The documentation on 08/26/23 for the exam, diagnosis, procedures, and orders are all accurate and complete.  Jacky Kindle, FNP  [facility] [phone] (phone) [phone] (fax)  [facility]

## 2023-08-26 NOTE — Assessment & Plan Note (Signed)
Chronic, on OTC supplement Repeat labs to further assist known OP

## 2023-08-26 NOTE — Assessment & Plan Note (Signed)

## 2023-08-28 LAB — CBC WITH DIFFERENTIAL/PLATELET
Basophils Absolute: 0.1 10*3/uL (ref 0.0–0.2)
Basos: 1 %
EOS (ABSOLUTE): 1.5 10*3/uL — ABNORMAL HIGH (ref 0.0–0.4)
Eos: 20 %
Hematocrit: 41.4 % (ref 34.0–46.6)
Hemoglobin: 13.3 g/dL (ref 11.1–15.9)
Immature Grans (Abs): 0 10*3/uL (ref 0.0–0.1)
Immature Granulocytes: 0 %
Lymphocytes Absolute: 1.8 10*3/uL (ref 0.7–3.1)
Lymphs: 25 %
MCH: 27 pg (ref 26.6–33.0)
MCHC: 32.1 g/dL (ref 31.5–35.7)
MCV: 84 fL (ref 79–97)
Monocytes Absolute: 0.6 10*3/uL (ref 0.1–0.9)
Monocytes: 8 %
Neutrophils Absolute: 3.4 10*3/uL (ref 1.4–7.0)
Neutrophils: 46 %
Platelets: 243 10*3/uL (ref 150–450)
RBC: 4.92 x10E6/uL (ref 3.77–5.28)
RDW: 13.2 % (ref 11.7–15.4)
WBC: 7.5 10*3/uL (ref 3.4–10.8)

## 2023-08-28 LAB — LIPID PANEL
Chol/HDL Ratio: 2.6 {ratio} (ref 0.0–4.4)
Cholesterol, Total: 173 mg/dL (ref 100–199)
HDL: 66 mg/dL (ref >39)
LDL Chol Calc (NIH): 84 mg/dL (ref 0–99)
Triglycerides: 135 mg/dL (ref 0–149)
VLDL Cholesterol Cal: 23 mg/dL (ref 5–40)

## 2023-08-28 LAB — COMPREHENSIVE METABOLIC PANEL
ALT: 13 [IU]/L (ref 0–32)
AST: 18 [IU]/L (ref 0–40)
Albumin: 4.4 g/dL (ref 3.9–4.9)
Alkaline Phosphatase: 76 [IU]/L (ref 44–121)
BUN/Creatinine Ratio: 27 (ref 12–28)
BUN: 24 mg/dL (ref 8–27)
Bilirubin Total: 0.3 mg/dL (ref 0.0–1.2)
CO2: 25 mmol/L (ref 20–29)
Calcium: 9.4 mg/dL (ref 8.7–10.3)
Chloride: 103 mmol/L (ref 96–106)
Creatinine, Ser: 0.9 mg/dL (ref 0.57–1.00)
Globulin, Total: 2.4 g/dL (ref 1.5–4.5)
Glucose: 95 mg/dL (ref 70–99)
Potassium: 4.6 mmol/L (ref 3.5–5.2)
Sodium: 141 mmol/L (ref 134–144)
Total Protein: 6.8 g/dL (ref 6.0–8.5)
eGFR: 69 mL/min/{1.73_m2} (ref >59)

## 2023-08-28 LAB — HEMOGLOBIN A1C
Est. average glucose Bld gHb Est-mCnc: 126 mg/dL
Hgb A1c MFr Bld: 6 % — ABNORMAL HIGH (ref 4.8–5.6)

## 2023-08-28 LAB — TSH: TSH: 0.732 u[IU]/mL (ref 0.450–4.500)

## 2023-08-28 LAB — VITAMIN D 25 HYDROXY (VIT D DEFICIENCY, FRACTURES): Vit D, 25-Hydroxy: 35.5 ng/mL (ref 30.0–100.0)

## 2023-08-28 LAB — MICROALBUMIN / CREATININE URINE RATIO
Creatinine, Urine: 112.7 mg/dL
Microalb/Creat Ratio: 10 mg/g{creat} (ref 0–29)
Microalbumin, Urine: 11.3 ug/mL

## 2023-09-26 ENCOUNTER — Other Ambulatory Visit: Payer: Self-pay | Admitting: Oncology

## 2023-09-26 DIAGNOSIS — Z170 Estrogen receptor positive status [ER+]: Secondary | ICD-10-CM

## 2023-10-02 ENCOUNTER — Telehealth: Payer: Self-pay | Admitting: *Deleted

## 2023-10-02 NOTE — Telephone Encounter (Signed)
Patient called in saying that she needs a new supply of her letrozole. I looked in the computer and it was sent over on January 13 however I did call to the pharmacy that she uses and they said it would be ready for her on tomorrow.  I had to leave a message for the patient because she did not answer the phone and she has this information when she reads it.

## 2023-10-29 ENCOUNTER — Ambulatory Visit: Payer: Self-pay | Admitting: Family Medicine

## 2023-11-05 DIAGNOSIS — Z1211 Encounter for screening for malignant neoplasm of colon: Secondary | ICD-10-CM | POA: Diagnosis not present

## 2023-11-13 LAB — COLOGUARD: COLOGUARD: NEGATIVE

## 2023-11-14 ENCOUNTER — Encounter: Payer: Self-pay | Admitting: Family Medicine

## 2023-11-26 ENCOUNTER — Ambulatory Visit: Payer: Medicare PPO | Admitting: Family Medicine

## 2023-12-18 ENCOUNTER — Other Ambulatory Visit: Payer: Medicare PPO

## 2023-12-23 ENCOUNTER — Ambulatory Visit: Admitting: Family Medicine

## 2023-12-23 ENCOUNTER — Inpatient Hospital Stay: Payer: Medicare PPO | Attending: Oncology | Admitting: Oncology

## 2023-12-23 DIAGNOSIS — M85851 Other specified disorders of bone density and structure, right thigh: Secondary | ICD-10-CM | POA: Diagnosis not present

## 2023-12-23 NOTE — Progress Notes (Unsigned)
 [facility]  Telephone:(336) [phone] [phone]) [phone]  ID: Nichole Martinez OB: 09/19/1950  MR#: [id number]  QMV#:[id number]  Patient Care Team: Jacky Kindle, FNP as PCP - General (Family Medicine) Jim Like, RN as Registered Nurse Rosezetta Schlatter, Alessandra Bevels, PA-C as Physician Assistant (Family Medicine) Jeralyn Ruths, MD as Consulting Physician (Oncology) Griselda Miner, MD as Consulting Physician (General Surgery) Pa, [facility])  I connected with Nichole Martinez on 12/25/23 at  2:45 PM EDT by video enabled telemedicine visit and verified that I am speaking with the correct person using two identifiers.   I discussed the limitations, risks, security and privacy concerns of performing an evaluation and management service by telemedicine and the availability of in-person appointments. I also discussed with the patient that there may be a patient responsible charge related to this service. The patient expressed understanding and agreed to proceed.   Other persons participating in the visit and their role in the encounter: Patient, MD.  Patient's location: Home. Provider's location: Clinic.   CHIEF COMPLAINT: Clinical stage IIa ER/PR positive, HER-2 negative invasive carcinoma of the central portion of the right breast.  INTERVAL HISTORY: Patient agreed to video-assisted telemedicine visit for routine 48-month evaluation.  She continues to feel well and remains asymptomatic.  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  She is tolerating letrozole without significant side effects.  She has no neurologic complaints. She denies any recent fevers or illnesses. She has a good appetite and denies weight loss.  She has no chest pain, shortness of breath, cough, or hemoptysis. She denies any nausea, vomiting, constipation, or diarrhea.  She has no urinary complaints.  Patient offers no specific complaints today.  REVIEW OF  SYSTEMS:   Review of Systems  Constitutional: Negative.  Negative for fever, malaise/fatigue and weight loss.  Respiratory: Negative.  Negative for cough, hemoptysis and shortness of breath.   Cardiovascular: Negative.  Negative for chest pain and leg swelling.  Gastrointestinal: Negative.  Negative for abdominal pain, diarrhea and nausea.  Genitourinary: Negative.  Negative for dysuria.  Musculoskeletal: Negative.  Negative for back pain and myalgias.  Skin: Negative.  Negative for rash.  Neurological: Negative.  Negative for focal weakness, weakness and headaches.  Psychiatric/Behavioral: Negative.  The patient is not nervous/anxious.     As per HPI. Otherwise, a complete review of systems is negative.  PAST MEDICAL HISTORY: Past Medical History:  Diagnosis Date   Allergy    Breast cancer, right (HCC) 2020   Currently taking hormonal chemo tx's.    Depression    Diabetes mellitus without complication (HCC) 2012   Dyspnea    due to weight   Hyperlipidemia    Hypertension    Osteoporosis    Trigeminal neuralgia     PAST SURGICAL HISTORY: Past Surgical History:  Procedure Laterality Date   ABDOMINAL HYSTERECTOMY  1978   BREAST BIOPSY Right 01/06/2019   [place] bx x 2.  Ribbon marker for mass and hydro for Lymph node. Path pending   BREAST CYST EXCISION Right 1986   BREAST EXCISIONAL BIOPSY     IR IMAGING GUIDED PORT INSERTION  05/12/2019   LAMINECTOMY  1995   L1, L2, L3   MASTECTOMY WITH AXILLARY LYMPH NODE DISSECTION Bilateral 11/17/2019   Procedure: BILATERAL MASTECTOMIES WITH RIGHT TARGETED SEED NODE DISSECTION;  Surgeon: Griselda Miner, MD;  Location: [facility];  Service: General;  Laterality: Bilateral;  PORT-A-CATH REMOVAL Left 02/25/2020   Procedure: REMOVAL PORT-A-CATH;  Surgeon: Griselda Miner, MD;  Location: [facility];  Service: General;  Laterality: Left;   REPLACEMENT TOTAL KNEE Left 04/28/2020   REPLACEMENT TOTAL KNEE Right 06/21/2020    SENTINEL NODE BIOPSY Right 11/17/2019   Procedure: RIGHT SENTINEL NODE BIOPSY;  Surgeon: Griselda Miner, MD;  Location: [facility];  Service: General;  Laterality: Right;   SPINE SURGERY      FAMILY HISTORY: Family History  Adopted: Yes  Family history unknown: Yes    ADVANCED DIRECTIVES (Y/N):  N  HEALTH MAINTENANCE: Social History   Tobacco Use   Smoking status: Former    Current packs/day: 0.00    Types: Cigarettes    Quit date: 03/16/2000    Years since quitting: 23.7   Smokeless tobacco: Never  Vaping Use   Vaping status: Never Used  Substance Use Topics   Alcohol use: No   Drug use: No     Colonoscopy:  PAP:  Bone density:  Lipid panel:  Allergies  Allergen Reactions   Morphine Anaphylaxis    Anaphallaxis.   Morphine And Codeine Anaphylaxis    Current Outpatient Medications  Medication Sig Dispense Refill   alendronate (FOSAMAX) 70 MG tablet Take 1 tablet (70 mg total) by mouth once a week. TAKE ONE TABLET BY MOUTH ONCE A WEEK ON AN EMPTY STOMACH  WITH A FULL GLASS OF WATER 13 tablet 3   amLODipine (NORVASC) 5 MG tablet Take 1 tablet (5 mg total) by mouth daily. 90 tablet 3   baclofen (LIORESAL) 10 MG tablet Take 1 tablet (10 mg total) by mouth daily. 90 tablet 1   Cholecalciferol (VITAMIN D3) 5000 units CAPS Take by mouth.     fluticasone (FLONASE) 50 MCG/ACT nasal spray Place 2 sprays into both nostrils daily. 16 g 6   letrozole (FEMARA) 2.5 MG tablet Take 1 tablet by mouth once daily 90 tablet 0   lisinopril-hydrochlorothiazide (ZESTORETIC) 10-12.5 MG tablet Take 1 tablet by mouth daily. 90 tablet 3   loratadine (CLARITIN) 10 MG tablet Take 1 tablet (10 mg total) by mouth daily as needed. 90 tablet 1   metFORMIN (GLUCOPHAGE) 1000 MG tablet Take 1 tablet (1,000 mg total) by mouth 2 (two) times daily with a meal. 180 tablet 1   MULTIPLE VITAMINS PO MULTIVITAMINS (Oral Tablet)  1 Every Day for 0 days  Quantity: 0.00;  Refills: 0   Ordered  :17-Apr-2011  Burnell Blanks ;  Started 24-February-2009 Active     OMEGA 3-6-9 FATTY ACIDS PO Reported on 10/04/2015     simvastatin (ZOCOR) 20 MG tablet Take 1 tablet (20 mg total) by mouth at bedtime. 90 tablet 1   Vitamin D, Ergocalciferol, (DRISDOL) 1.25 MG (50000 UNIT) CAPS capsule Take 1 capsule by mouth once a week 12 capsule 1   vitamin E 1000 UNIT capsule Take 1,000 Units by mouth daily.     No current facility-administered medications for this visit.   Facility-Administered Medications Ordered in Other Visits  Medication Dose Route Frequency Provider Last Rate Last Admin   heparin lock flush 100 unit/mL  500 Units Intravenous Once Jeralyn Ruths, MD       sodium chloride flush (NS) 0.9 % injection 10 mL  10 mL Intravenous Once Jeralyn Ruths, MD        OBJECTIVE: There were no vitals filed for this visit.    There is no height or weight on file  to calculate BMI.    ECOG FS:0 - Asymptomatic   LAB RESULTS:  Lab Results  Component Value Date   NA 141 08/26/2023   K 4.6 08/26/2023   CL 103 08/26/2023   CO2 25 08/26/2023   GLUCOSE 95 08/26/2023   BUN 24 08/26/2023   CREATININE 0.90 08/26/2023   CALCIUM 9.4 08/26/2023   PROT 6.8 08/26/2023   ALBUMIN 4.4 08/26/2023   AST 18 08/26/2023   ALT 13 08/26/2023   ALKPHOS 76 08/26/2023   BILITOT 0.3 08/26/2023   GFRNONAA 82 08/04/2020   GFRAA 95 08/04/2020    Lab Results  Component Value Date   WBC 7.5 08/26/2023   NEUTROABS 3.4 08/26/2023   HGB 13.3 08/26/2023   HCT 41.4 08/26/2023   MCV 84 08/26/2023   PLT 243 08/26/2023     STUDIES: No results found.  ONCOLOGY HISTORY: Although patient was a clinical stage IIa and typically neoadjuvant chemotherapy is the recommendation, she initially did not wish to pursue aggressive immunosuppressive treatment during the COVID-19 pandemic.  Instead, patient received neoadjuvant letrozole from April 2020 through August 2020.  She then proceeded with neoadjuvant chemotherapy  using Adriamycin, Cytoxan with Udenyca support followed by weekly Taxol x12. Patient completed her chemotherapy on September 30, 2019. She subsequently underwent bilateral mastectomy on November 17, 2019. Residual disease was noted in the breast and patient had evidence of micrometastasis in one lymph node. Because of her bilateral mastectomy, she did not require adjuvant XRT.   ASSESSMENT: Clinical stage IIa ER/PR positive, HER-2 negative invasive carcinoma of the central portion of the right breast.  Oncotype DX score 7.  PLAN:   Clinical stage IIa ER/PR positive, HER-2 negative invasive carcinoma of the central portion of the right breast: See oncology history as above.  Patient does not require additional mammograms given her bilateral mastectomy.  Patient will complete 5 years of letrozole in April 2025, but given her high risk disease, will likely continue treatment through at least April 2027.  No intervention is needed.  Return to clinic with video-assisted telemedicine visit in 6 months.   Osteopenia: Patient's most recent bone mineral density on December 16, 2022 reported T-score of -1.8.  This is relatively unchanged over the past several years where her T-score was reported at -1.6 and -1.9 respectively.  She was previously instructed to discontinue Fosamax, but continue calcium and vitamin D supplementation.  Repeat mineral density in the next 1 to 2 weeks.   Thyroid nodule: Biopsy reported as benign.  Likely multinodular goiter.  I provided 20 minutes of face-to-face video visit time during this encounter which included chart review, counseling, and coordination of care as documented above.   Patient expressed understanding and was in agreement with this plan. She also understands that She can call clinic at any time with any questions, concerns, or complaints.    Jeralyn Ruths, MD   12/23/2023 2:58 PM

## 2023-12-30 ENCOUNTER — Ambulatory Visit
Admission: RE | Admit: 2023-12-30 | Discharge: 2023-12-30 | Disposition: A | Discharge status: Home / Self Care | Source: Ambulatory Visit | Attending: Oncology | Admitting: Oncology

## 2023-12-30 ENCOUNTER — Inpatient Hospital Stay: Admit: 2023-12-30

## 2023-12-30 DIAGNOSIS — M85851 Other specified disorders of bone density and structure, right thigh: Secondary | ICD-10-CM | POA: Diagnosis present

## 2024-04-02 ENCOUNTER — Telehealth: Payer: Self-pay

## 2024-04-02 DIAGNOSIS — D229 Melanocytic nevi, unspecified: Secondary | ICD-10-CM

## 2024-04-02 NOTE — Telephone Encounter (Signed)
 Copied from CRM [id number]. Topic: Referral - Request for Referral >> Apr 01, 2024  4:36 PM Mia F wrote: Did the patient discuss referral with their provider in the last year? No (If No - schedule appointment) (If Yes - send message)  Appointment offered? Yes  Type of order/referral and detailed reason for visit: Changing mole and a red bump on head   [facility], [phone]  If referral order, have you been seen by this specialty before? No (If Yes, this issue or another issue? When? Where?  Can we respond through MyChart? No

## 2024-04-06 NOTE — Addendum Note (Signed)
 Addended byBETHA DONZELLA DOMINO on: 04/06/2024 01:55 PM   Modules accepted: Orders

## 2024-05-21 ENCOUNTER — Other Ambulatory Visit: Payer: Self-pay | Admitting: Oncology

## 2024-05-21 DIAGNOSIS — Z170 Estrogen receptor positive status [ER+]: Secondary | ICD-10-CM

## 2024-06-04 ENCOUNTER — Ambulatory Visit: Admitting: Family Medicine

## 2024-06-17 ENCOUNTER — Telehealth: Payer: Self-pay | Admitting: Oncology

## 2024-06-17 NOTE — Telephone Encounter (Signed)
 I called pt and left her a vm regarding changing her video visit to an in person visit due to the government shut down. If she has any questions or need to rs she can call us  back.

## 2024-06-23 ENCOUNTER — Telehealth: Admitting: Oncology

## 2024-07-05 ENCOUNTER — Inpatient Hospital Stay: Attending: Oncology | Admitting: Oncology

## 2024-07-05 ENCOUNTER — Encounter: Payer: Self-pay | Admitting: Oncology

## 2024-07-05 DIAGNOSIS — C50111 Malignant neoplasm of central portion of right female breast: Secondary | ICD-10-CM | POA: Insufficient documentation

## 2024-07-05 DIAGNOSIS — M810 Age-related osteoporosis without current pathological fracture: Secondary | ICD-10-CM | POA: Insufficient documentation

## 2024-07-05 DIAGNOSIS — Z79811 Long term (current) use of aromatase inhibitors: Secondary | ICD-10-CM | POA: Diagnosis not present

## 2024-07-05 DIAGNOSIS — Z170 Estrogen receptor positive status [ER+]: Secondary | ICD-10-CM | POA: Insufficient documentation

## 2024-07-05 MED ORDER — LETROZOLE 2.5 MG PO TABS
2.5000 mg | ORAL_TABLET | Freq: Every day | ORAL | 3 refills | Status: AC
Start: 2024-07-05 — End: ?

## 2024-07-05 NOTE — Progress Notes (Unsigned)
 [facility]  Telephone:(336) [phone] Fax:(336) [phone]  ID: Nichole Martinez OB: [birth year]  MR#: [id number]  RDW#:[id number]  Patient Care Team: Emilio Kelly DASEN, FNP (Inactive) as PCP - General (Family Medicine) Cindie Jesusa HERO, RN as Registered Nurse Vivienne, Delon HERO, PA-C as Physician Assistant (Family Medicine) Jacobo Evalene PARAS, MD as Consulting Physician (Oncology) Curvin Deward MOULD, MD as Consulting Physician (General Surgery) Pa,  [facility])   CHIEF COMPLAINT: Clinical stage IIa ER/PR positive, HER-2 negative invasive carcinoma of the central portion of the right breast.  INTERVAL HISTORY: Patient returns to clinic today for routine 90-month evaluation.  She continues to feel well and remains asymptomatic.  She continues to remain active.  She is tolerating letrozole  without significant side effects.  She has no neurologic complaints. She denies any recent fevers or illnesses. She has a good appetite and denies weight loss.  She has no chest pain, shortness of breath, cough, or hemoptysis. She denies any nausea, vomiting, constipation, or diarrhea.  She has no urinary complaints.  Patient offers no specific complaints today.  REVIEW OF SYSTEMS:   Review of Systems  Constitutional: Negative.  Negative for fever, malaise/fatigue and weight loss.  Respiratory: Negative.  Negative for cough, hemoptysis and shortness of breath.   Cardiovascular: Negative.  Negative for chest pain and leg swelling.  Gastrointestinal: Negative.  Negative for abdominal pain, diarrhea and nausea.  Genitourinary: Negative.  Negative for dysuria.  Musculoskeletal: Negative.  Negative for back pain and myalgias.  Skin: Negative.  Negative for rash.  Neurological: Negative.  Negative for focal weakness, weakness and headaches.  Psychiatric/Behavioral: Negative.  The patient is not nervous/anxious.     As per HPI. Otherwise, a complete review of systems is  negative.  PAST MEDICAL HISTORY: Past Medical History:  Diagnosis Date   Allergy    Breast cancer, right (HCC) 2020   Currently taking hormonal chemo tx's.    Depression    Diabetes mellitus without complication (HCC) 2012   Dyspnea    due to weight   Hyperlipidemia    Hypertension    Osteoporosis    Trigeminal neuralgia     PAST SURGICAL HISTORY: Past Surgical History:  Procedure Laterality Date   ABDOMINAL HYSTERECTOMY  1978   BREAST BIOPSY Right 01/06/2019   us  bx x 2.  Ribbon marker for mass and hydro for Lymph node. Path pending   BREAST CYST EXCISION Right 1986   BREAST EXCISIONAL BIOPSY     IR IMAGING GUIDED PORT INSERTION  05/12/2019   LAMINECTOMY  1995   L1, L2, L3   MASTECTOMY WITH AXILLARY LYMPH NODE DISSECTION Bilateral 11/17/2019   Procedure: BILATERAL MASTECTOMIES WITH RIGHT TARGETED SEED NODE DISSECTION;  Surgeon: Curvin Deward MOULD, MD;  Location: [facility];  Service: General;  Laterality: Bilateral;   PORT-A-CATH REMOVAL Left 02/25/2020   Procedure: REMOVAL PORT-A-CATH;  Surgeon: Curvin Deward MOULD, MD;  Location: [facility];  Service: General;  Laterality: Left;   REPLACEMENT TOTAL KNEE Left 04/28/2020   REPLACEMENT TOTAL KNEE Right 06/21/2020   SENTINEL NODE BIOPSY Right 11/17/2019   Procedure: RIGHT SENTINEL NODE BIOPSY;  Surgeon: Curvin Deward MOULD, MD;  Location: [facility];  Service: General;  Laterality: Right;   SPINE SURGERY      FAMILY HISTORY: Family History  Adopted: Yes  Family history unknown: Yes    ADVANCED DIRECTIVES (Y/N):  N  HEALTH MAINTENANCE: Social History   Tobacco Use   Smoking status:  Former    Current packs/day: 0.00    Types: Cigarettes    Quit date: 03/16/2000    Years since quitting: 24.3   Smokeless tobacco: Never  Vaping Use   Vaping status: Never Used  Substance Use Topics   Alcohol use: No   Drug use: No     Colonoscopy:  PAP:  Bone density:  Lipid panel:  Allergies   Allergen Reactions   Morphine Anaphylaxis    Anaphallaxis.   Morphine And Codeine Anaphylaxis    Current Outpatient Medications  Medication Sig Dispense Refill   alendronate  (FOSAMAX ) 70 MG tablet Take 1 tablet (70 mg total) by mouth once a week. TAKE ONE TABLET BY MOUTH ONCE A WEEK ON AN EMPTY STOMACH  WITH A FULL GLASS OF WATER 13 tablet 3   amLODipine  (NORVASC ) 5 MG tablet Take 1 tablet (5 mg total) by mouth daily. 90 tablet 3   baclofen  (LIORESAL ) 10 MG tablet Take 1 tablet (10 mg total) by mouth daily. 90 tablet 1   Cholecalciferol (VITAMIN D3) 5000 units CAPS Take by mouth.     fluticasone  (FLONASE ) 50 MCG/ACT nasal spray Place 2 sprays into both nostrils daily. 16 g 6   letrozole  (FEMARA ) 2.5 MG tablet Take 1 tablet by mouth once daily 90 tablet 0   lisinopril -hydrochlorothiazide  (ZESTORETIC ) 10-12.5 MG tablet Take 1 tablet by mouth daily. 90 tablet 3   loratadine  (CLARITIN ) 10 MG tablet Take 1 tablet (10 mg total) by mouth daily as needed. 90 tablet 1   metFORMIN  (GLUCOPHAGE ) 1000 MG tablet Take 1 tablet (1,000 mg total) by mouth 2 (two) times daily with a meal. 180 tablet 1   MULTIPLE VITAMINS PO MULTIVITAMINS (Oral Tablet)  1 Every Day for 0 days  Quantity: 0.00;  Refills: 0   Ordered :17-Apr-2011  Nichole Martinez ;  Started 24-February-2009 Active     OMEGA 3-6-9 FATTY ACIDS PO Reported on 10/04/2015     simvastatin  (ZOCOR ) 20 MG tablet Take 1 tablet (20 mg total) by mouth at bedtime. 90 tablet 1   Vitamin D , Ergocalciferol , (DRISDOL ) 1.25 MG (50000 UNIT) CAPS capsule Take 1 capsule by mouth once a week 12 capsule 1   vitamin E 1000 UNIT capsule Take 1,000 Units by mouth daily.     No current facility-administered medications for this visit.   Facility-Administered Medications Ordered in Other Visits  Medication Dose Route Frequency Provider Last Rate Last Admin   heparin  lock flush 100 unit/mL  500 Units Intravenous Once Caterina Racine J, MD       sodium chloride  flush (NS) 0.9 %  injection 10 mL  10 mL Intravenous Once Jjesus Dingley J, MD        OBJECTIVE: Vitals:   07/05/24 1355  BP: (!) 130/90  Pulse: (!) 55  Resp: 18  Temp: (!) 97.5 F (36.4 C)  SpO2: 99%      Body mass index is 30.95 kg/m.    ECOG FS:0 - Asymptomatic  General: Well-developed, well-nourished, no acute distress. Eyes: Pink conjunctiva, anicteric sclera. HEENT: Normocephalic, moist mucous membranes. Lungs: No audible wheezing or coughing. Heart: Regular rate and rhythm. Abdomen: Soft, nontender, no obvious distention. Musculoskeletal: No edema, cyanosis, or clubbing. Neuro: Alert, answering all questions appropriately. Cranial nerves grossly intact. Skin: No rashes or petechiae noted. Psych: Normal affect.  LAB RESULTS:  Lab Results  Component Value Date   NA 141 08/26/2023   K 4.6 08/26/2023   CL 103 08/26/2023   CO2 25 08/26/2023  GLUCOSE 95 08/26/2023   BUN 24 08/26/2023   CREATININE 0.90 08/26/2023   CALCIUM 9.4 08/26/2023   PROT 6.8 08/26/2023   ALBUMIN 4.4 08/26/2023   AST 18 08/26/2023   ALT 13 08/26/2023   ALKPHOS 76 08/26/2023   BILITOT 0.3 08/26/2023   GFRNONAA 82 08/04/2020   GFRAA 95 08/04/2020    Lab Results  Component Value Date   WBC 7.5 08/26/2023   NEUTROABS 3.4 08/26/2023   HGB 13.3 08/26/2023   HCT 41.4 08/26/2023   MCV 84 08/26/2023   PLT 243 08/26/2023     STUDIES: No results found.  ONCOLOGY HISTORY: Although patient was a clinical stage IIa and typically neoadjuvant chemotherapy is the recommendation, she initially did not wish to pursue aggressive immunosuppressive treatment during the COVID-19 pandemic.  Instead, patient received neoadjuvant letrozole  from April 2020 through August 2020.  She then proceeded with neoadjuvant chemotherapy using Adriamycin , Cytoxan  with Udenyca  support followed by weekly Taxol  x12. Patient completed her chemotherapy on September 30, 2019. She subsequently underwent bilateral mastectomy on November 17, 2019.  Residual disease was noted in the breast and patient had evidence of micrometastasis in one lymph node. Because of her bilateral mastectomy, she did not require adjuvant XRT.   ASSESSMENT: Clinical stage IIa ER/PR positive, HER-2 negative invasive carcinoma of the central portion of the right breast.  Oncotype DX score 7.  PLAN:   Clinical stage IIa ER/PR positive, HER-2 negative invasive carcinoma of the central portion of the right breast: See oncology history as above.  Patient does not require additional mammograms given her bilateral mastectomy.  Patient completed 5 years of letrozole  in April 2025, but given her high risk disease, agreed to take 2 additional years completing in April 2027.  No further intervention is needed.  Return to clinic in 6 months for routine evaluation.    Osteopenia: Repeat bone mineral density on December 30, 2023 reported persistent osteopenia with no significant rate of change from previous exam in April 2024.  Patient does not need to reinitiate Fosamax  at this time.  Continue calcium and vitamin D  supplementation.   Hypertension: Patient has a mildly elevated diastolic blood pressure today.  Continue monitoring and treatment per primary care.  Patient expressed understanding and was in agreement with this plan. She also understands that She can call clinic at any time with any questions, concerns, or complaints.    Evalene JINNY Reusing, MD   07/05/2024 2:12 PM

## 2024-07-05 NOTE — Progress Notes (Unsigned)
 Patient is doing fine, no new questions for the doctor today.

## 2024-07-12 ENCOUNTER — Ambulatory Visit: Admitting: Dermatology

## 2024-09-28 ENCOUNTER — Ambulatory Visit: Admitting: Dermatology

## 2024-10-26 ENCOUNTER — Ambulatory Visit

## 2025-01-06 ENCOUNTER — Inpatient Hospital Stay: Admitting: Oncology
